# Patient Record
Sex: Female | Born: 1950 | Race: White | Hispanic: No | Smoking: Former smoker
Health system: Southern US, Community
[De-identification: ages and names within clinical notes are randomized; demographics above are authoritative.]

## PROBLEM LIST (undated history)

## (undated) DIAGNOSIS — G4733 Obstructive sleep apnea (adult) (pediatric): Secondary | ICD-10-CM

## (undated) DIAGNOSIS — M069 Rheumatoid arthritis, unspecified: Secondary | ICD-10-CM

## (undated) DIAGNOSIS — Z9889 Other specified postprocedural states: Secondary | ICD-10-CM

## (undated) DIAGNOSIS — M009 Pyogenic arthritis, unspecified: Secondary | ICD-10-CM

## (undated) DIAGNOSIS — F32A Depression, unspecified: Secondary | ICD-10-CM

## (undated) DIAGNOSIS — K219 Gastro-esophageal reflux disease without esophagitis: Secondary | ICD-10-CM

## (undated) DIAGNOSIS — J449 Chronic obstructive pulmonary disease, unspecified: Secondary | ICD-10-CM

## (undated) DIAGNOSIS — E785 Hyperlipidemia, unspecified: Secondary | ICD-10-CM

## (undated) DIAGNOSIS — R112 Nausea with vomiting, unspecified: Secondary | ICD-10-CM

## (undated) DIAGNOSIS — I10 Essential (primary) hypertension: Secondary | ICD-10-CM

## (undated) DIAGNOSIS — E109 Type 1 diabetes mellitus without complications: Secondary | ICD-10-CM

## (undated) DIAGNOSIS — F329 Major depressive disorder, single episode, unspecified: Secondary | ICD-10-CM

## (undated) DIAGNOSIS — M1711 Unilateral primary osteoarthritis, right knee: Secondary | ICD-10-CM

## (undated) HISTORY — DX: Depression, unspecified: F32.A

## (undated) HISTORY — DX: Pyogenic arthritis, unspecified: M00.9

## (undated) HISTORY — DX: Obstructive sleep apnea (adult) (pediatric): G47.33

## (undated) HISTORY — DX: Type 1 diabetes mellitus without complications: E10.9

## (undated) HISTORY — DX: Morbid (severe) obesity due to excess calories: E66.01

## (undated) HISTORY — DX: Gastro-esophageal reflux disease without esophagitis: K21.9

## (undated) HISTORY — DX: Essential (primary) hypertension: I10

## (undated) HISTORY — DX: Major depressive disorder, single episode, unspecified: F32.9

## (undated) HISTORY — DX: Unilateral primary osteoarthritis, right knee: M17.11

## (undated) HISTORY — DX: Rheumatoid arthritis, unspecified: M06.9

## (undated) HISTORY — DX: Chronic obstructive pulmonary disease, unspecified: J44.9

## (undated) HISTORY — DX: Hyperlipidemia, unspecified: E78.5

## (undated) HISTORY — PX: BREAST BIOPSY: SHX20

---
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
## 1990-04-11 HISTORY — PX: AORTO-PULMONARY WINDOW REPAIR: SHX1181

## 1990-04-11 HISTORY — PX: ABDOMINAL HYSTERECTOMY: SHX81

## 1990-04-11 HISTORY — PX: CHOLECYSTECTOMY: SHX55

## 1999-04-12 HISTORY — PX: HERNIA REPAIR: SHX51

## 2001-01-19 ENCOUNTER — Ambulatory Visit (HOSPITAL_COMMUNITY): Admission: RE | Admit: 2001-01-19 | Discharge: 2001-01-19 | Payer: Self-pay | Admitting: Internal Medicine

## 2001-07-19 ENCOUNTER — Ambulatory Visit (HOSPITAL_COMMUNITY): Admission: RE | Admit: 2001-07-19 | Discharge: 2001-07-19 | Payer: Self-pay | Admitting: Internal Medicine

## 2001-07-19 ENCOUNTER — Encounter: Payer: Self-pay | Admitting: Internal Medicine

## 2001-08-15 ENCOUNTER — Encounter: Admission: RE | Admit: 2001-08-15 | Discharge: 2001-11-13 | Payer: Self-pay | Admitting: Internal Medicine

## 2002-08-28 ENCOUNTER — Ambulatory Visit (HOSPITAL_COMMUNITY): Admission: RE | Admit: 2002-08-28 | Discharge: 2002-08-28 | Payer: Self-pay | Admitting: Internal Medicine

## 2002-08-28 ENCOUNTER — Encounter: Payer: Self-pay | Admitting: Internal Medicine

## 2002-12-27 ENCOUNTER — Ambulatory Visit (HOSPITAL_COMMUNITY): Admission: RE | Admit: 2002-12-27 | Discharge: 2002-12-27 | Payer: Self-pay | Admitting: Internal Medicine

## 2003-03-26 ENCOUNTER — Emergency Department (HOSPITAL_COMMUNITY): Admission: EM | Admit: 2003-03-26 | Discharge: 2003-03-26 | Payer: Self-pay | Admitting: Emergency Medicine

## 2003-08-11 ENCOUNTER — Ambulatory Visit (HOSPITAL_COMMUNITY): Admission: RE | Admit: 2003-08-11 | Discharge: 2003-08-11 | Payer: Self-pay | Admitting: Orthopedic Surgery

## 2004-01-07 ENCOUNTER — Ambulatory Visit (HOSPITAL_COMMUNITY): Admission: RE | Admit: 2004-01-07 | Discharge: 2004-01-07 | Payer: Self-pay | Admitting: Internal Medicine

## 2004-01-14 ENCOUNTER — Encounter: Admission: RE | Admit: 2004-01-14 | Discharge: 2004-01-14 | Payer: Self-pay | Admitting: Internal Medicine

## 2004-01-15 ENCOUNTER — Encounter: Admission: RE | Admit: 2004-01-15 | Discharge: 2004-01-15 | Payer: Self-pay | Admitting: Internal Medicine

## 2004-01-15 ENCOUNTER — Encounter (INDEPENDENT_AMBULATORY_CARE_PROVIDER_SITE_OTHER): Payer: Self-pay | Admitting: Specialist

## 2004-03-24 ENCOUNTER — Ambulatory Visit: Payer: Self-pay | Admitting: Physical Medicine & Rehabilitation

## 2004-03-24 ENCOUNTER — Inpatient Hospital Stay (HOSPITAL_COMMUNITY): Admission: RE | Admit: 2004-03-24 | Discharge: 2004-03-29 | Payer: Self-pay | Admitting: Orthopedic Surgery

## 2004-09-30 ENCOUNTER — Ambulatory Visit: Payer: Self-pay | Admitting: Internal Medicine

## 2004-11-17 ENCOUNTER — Ambulatory Visit: Payer: Self-pay | Admitting: Internal Medicine

## 2005-01-14 ENCOUNTER — Ambulatory Visit (HOSPITAL_COMMUNITY): Admission: RE | Admit: 2005-01-14 | Discharge: 2005-01-14 | Payer: Self-pay | Admitting: Internal Medicine

## 2005-04-11 HISTORY — PX: HERNIA REPAIR: SHX51

## 2005-04-27 ENCOUNTER — Ambulatory Visit (HOSPITAL_COMMUNITY): Admission: RE | Admit: 2005-04-27 | Discharge: 2005-04-27 | Payer: Self-pay | Admitting: Orthopedic Surgery

## 2005-08-11 ENCOUNTER — Encounter (INDEPENDENT_AMBULATORY_CARE_PROVIDER_SITE_OTHER): Payer: Self-pay | Admitting: Specialist

## 2005-08-11 ENCOUNTER — Ambulatory Visit (HOSPITAL_BASED_OUTPATIENT_CLINIC_OR_DEPARTMENT_OTHER): Admission: RE | Admit: 2005-08-11 | Discharge: 2005-08-11 | Payer: Self-pay | Admitting: Orthopedic Surgery

## 2005-12-16 ENCOUNTER — Inpatient Hospital Stay (HOSPITAL_COMMUNITY): Admission: EM | Admit: 2005-12-16 | Discharge: 2005-12-20 | Payer: Self-pay | Admitting: Emergency Medicine

## 2006-02-06 ENCOUNTER — Ambulatory Visit (HOSPITAL_COMMUNITY): Admission: RE | Admit: 2006-02-06 | Discharge: 2006-02-06 | Payer: Self-pay | Admitting: Internal Medicine

## 2006-06-04 ENCOUNTER — Emergency Department (HOSPITAL_COMMUNITY): Admission: EM | Admit: 2006-06-04 | Discharge: 2006-06-04 | Payer: Self-pay | Admitting: Emergency Medicine

## 2007-02-08 ENCOUNTER — Ambulatory Visit (HOSPITAL_COMMUNITY): Admission: RE | Admit: 2007-02-08 | Discharge: 2007-02-08 | Payer: Self-pay | Admitting: Internal Medicine

## 2007-08-20 DIAGNOSIS — E785 Hyperlipidemia, unspecified: Secondary | ICD-10-CM | POA: Insufficient documentation

## 2007-08-20 DIAGNOSIS — E119 Type 2 diabetes mellitus without complications: Secondary | ICD-10-CM

## 2007-08-20 DIAGNOSIS — E1110 Type 2 diabetes mellitus with ketoacidosis without coma: Secondary | ICD-10-CM | POA: Insufficient documentation

## 2007-08-20 DIAGNOSIS — J45909 Unspecified asthma, uncomplicated: Secondary | ICD-10-CM | POA: Insufficient documentation

## 2007-08-21 ENCOUNTER — Ambulatory Visit: Payer: Self-pay | Admitting: Pulmonary Disease

## 2007-08-21 DIAGNOSIS — J441 Chronic obstructive pulmonary disease with (acute) exacerbation: Secondary | ICD-10-CM | POA: Insufficient documentation

## 2007-08-21 DIAGNOSIS — J439 Emphysema, unspecified: Secondary | ICD-10-CM

## 2007-08-21 DIAGNOSIS — G4733 Obstructive sleep apnea (adult) (pediatric): Secondary | ICD-10-CM | POA: Insufficient documentation

## 2007-08-23 LAB — CONVERTED CEMR LAB: TSH: 0.33 microintl units/mL — ABNORMAL LOW (ref 0.35–5.50)

## 2007-09-02 ENCOUNTER — Ambulatory Visit (HOSPITAL_BASED_OUTPATIENT_CLINIC_OR_DEPARTMENT_OTHER): Admission: RE | Admit: 2007-09-02 | Discharge: 2007-09-02 | Payer: Self-pay | Admitting: Pulmonary Disease

## 2007-09-02 ENCOUNTER — Encounter: Payer: Self-pay | Admitting: Pulmonary Disease

## 2007-09-18 ENCOUNTER — Ambulatory Visit: Payer: Self-pay | Admitting: Pulmonary Disease

## 2007-09-20 ENCOUNTER — Ambulatory Visit: Payer: Self-pay | Admitting: Pulmonary Disease

## 2007-09-24 ENCOUNTER — Encounter: Payer: Self-pay | Admitting: Pulmonary Disease

## 2007-10-20 ENCOUNTER — Emergency Department (HOSPITAL_COMMUNITY): Admission: EM | Admit: 2007-10-20 | Discharge: 2007-10-20 | Payer: Self-pay | Admitting: Emergency Medicine

## 2007-10-25 ENCOUNTER — Ambulatory Visit (HOSPITAL_COMMUNITY): Admission: RE | Admit: 2007-10-25 | Discharge: 2007-10-25 | Payer: Self-pay | Admitting: General Surgery

## 2007-10-30 ENCOUNTER — Ambulatory Visit: Payer: Self-pay | Admitting: Pulmonary Disease

## 2007-11-29 ENCOUNTER — Encounter: Payer: Self-pay | Admitting: Pulmonary Disease

## 2008-02-15 ENCOUNTER — Ambulatory Visit: Payer: Self-pay | Admitting: Pulmonary Disease

## 2008-02-15 ENCOUNTER — Telehealth (INDEPENDENT_AMBULATORY_CARE_PROVIDER_SITE_OTHER): Payer: Self-pay | Admitting: *Deleted

## 2008-02-15 ENCOUNTER — Inpatient Hospital Stay (HOSPITAL_COMMUNITY): Admission: AD | Admit: 2008-02-15 | Discharge: 2008-02-20 | Payer: Self-pay | Admitting: Pulmonary Disease

## 2008-02-15 DIAGNOSIS — J45901 Unspecified asthma with (acute) exacerbation: Secondary | ICD-10-CM

## 2008-02-15 DIAGNOSIS — J209 Acute bronchitis, unspecified: Secondary | ICD-10-CM

## 2008-02-27 ENCOUNTER — Ambulatory Visit: Payer: Self-pay | Admitting: Pulmonary Disease

## 2008-02-27 DIAGNOSIS — R0602 Shortness of breath: Secondary | ICD-10-CM

## 2008-02-27 DIAGNOSIS — R05 Cough: Secondary | ICD-10-CM

## 2008-03-17 ENCOUNTER — Encounter: Payer: Self-pay | Admitting: Pulmonary Disease

## 2008-03-17 ENCOUNTER — Ambulatory Visit: Payer: Self-pay

## 2008-03-26 ENCOUNTER — Ambulatory Visit: Payer: Self-pay | Admitting: Pulmonary Disease

## 2008-04-30 ENCOUNTER — Encounter: Admission: RE | Admit: 2008-04-30 | Discharge: 2008-04-30 | Payer: Self-pay | Admitting: Internal Medicine

## 2008-05-09 ENCOUNTER — Encounter: Admission: RE | Admit: 2008-05-09 | Discharge: 2008-05-09 | Payer: Self-pay | Admitting: Internal Medicine

## 2008-07-24 ENCOUNTER — Ambulatory Visit: Payer: Self-pay | Admitting: Pulmonary Disease

## 2009-01-16 ENCOUNTER — Encounter (HOSPITAL_BASED_OUTPATIENT_CLINIC_OR_DEPARTMENT_OTHER): Admission: RE | Admit: 2009-01-16 | Discharge: 2009-04-06 | Payer: Self-pay | Admitting: General Surgery

## 2009-01-19 ENCOUNTER — Encounter (INDEPENDENT_AMBULATORY_CARE_PROVIDER_SITE_OTHER): Payer: Self-pay | Admitting: *Deleted

## 2009-01-19 LAB — CONVERTED CEMR LAB
Albumin: 3.8 g/dL
Alkaline Phosphatase: 81 units/L
CO2: 24 meq/L
Chloride: 103 meq/L
Creatinine, Ser: 0.93 mg/dL
Glucose, Bld: 98 mg/dL
Total Protein: 7.3 g/dL

## 2009-01-21 ENCOUNTER — Ambulatory Visit (HOSPITAL_COMMUNITY): Admission: RE | Admit: 2009-01-21 | Discharge: 2009-01-21 | Payer: Self-pay | Admitting: General Surgery

## 2009-01-26 ENCOUNTER — Ambulatory Visit: Payer: Self-pay | Admitting: Pulmonary Disease

## 2009-04-17 ENCOUNTER — Encounter (HOSPITAL_BASED_OUTPATIENT_CLINIC_OR_DEPARTMENT_OTHER): Admission: RE | Admit: 2009-04-17 | Discharge: 2009-07-16 | Payer: Self-pay | Admitting: General Surgery

## 2009-05-12 ENCOUNTER — Telehealth (INDEPENDENT_AMBULATORY_CARE_PROVIDER_SITE_OTHER): Payer: Self-pay | Admitting: *Deleted

## 2009-05-13 ENCOUNTER — Ambulatory Visit: Payer: Self-pay | Admitting: Pulmonary Disease

## 2009-05-15 ENCOUNTER — Encounter (INDEPENDENT_AMBULATORY_CARE_PROVIDER_SITE_OTHER): Payer: Self-pay | Admitting: *Deleted

## 2009-05-29 ENCOUNTER — Ambulatory Visit: Payer: Self-pay | Admitting: Cardiovascular Disease

## 2009-05-29 DIAGNOSIS — R011 Cardiac murmur, unspecified: Secondary | ICD-10-CM | POA: Insufficient documentation

## 2009-06-04 ENCOUNTER — Ambulatory Visit: Payer: Self-pay | Admitting: Cardiology

## 2009-06-04 ENCOUNTER — Encounter (HOSPITAL_COMMUNITY): Admission: RE | Admit: 2009-06-04 | Discharge: 2009-07-04 | Payer: Self-pay | Admitting: Cardiovascular Disease

## 2009-06-09 ENCOUNTER — Encounter: Payer: Self-pay | Admitting: Cardiovascular Disease

## 2009-06-11 ENCOUNTER — Encounter: Payer: Self-pay | Admitting: Cardiovascular Disease

## 2009-06-11 ENCOUNTER — Encounter (INDEPENDENT_AMBULATORY_CARE_PROVIDER_SITE_OTHER): Payer: Self-pay | Admitting: *Deleted

## 2009-06-11 ENCOUNTER — Telehealth (INDEPENDENT_AMBULATORY_CARE_PROVIDER_SITE_OTHER): Payer: Self-pay | Admitting: *Deleted

## 2009-06-15 ENCOUNTER — Encounter (INDEPENDENT_AMBULATORY_CARE_PROVIDER_SITE_OTHER): Payer: Self-pay | Admitting: *Deleted

## 2009-06-15 ENCOUNTER — Encounter: Payer: Self-pay | Admitting: Cardiovascular Disease

## 2009-07-06 ENCOUNTER — Encounter: Admission: RE | Admit: 2009-07-06 | Discharge: 2009-07-06 | Payer: Self-pay | Admitting: Internal Medicine

## 2009-07-28 ENCOUNTER — Encounter (HOSPITAL_BASED_OUTPATIENT_CLINIC_OR_DEPARTMENT_OTHER): Admission: RE | Admit: 2009-07-28 | Discharge: 2009-10-26 | Payer: Self-pay | Admitting: General Surgery

## 2009-08-03 ENCOUNTER — Ambulatory Visit: Payer: Self-pay | Admitting: Pulmonary Disease

## 2009-08-06 ENCOUNTER — Telehealth (INDEPENDENT_AMBULATORY_CARE_PROVIDER_SITE_OTHER): Payer: Self-pay | Admitting: *Deleted

## 2009-08-13 ENCOUNTER — Observation Stay (HOSPITAL_COMMUNITY): Admission: AD | Admit: 2009-08-13 | Discharge: 2009-08-15 | Payer: Self-pay | Admitting: Surgery

## 2009-08-13 ENCOUNTER — Ambulatory Visit: Payer: Self-pay | Admitting: Pulmonary Disease

## 2009-08-13 HISTORY — PX: ABDOMINAL WALL MESH  REMOVAL: SHX1116

## 2009-09-04 ENCOUNTER — Encounter: Payer: Self-pay | Admitting: Pulmonary Disease

## 2009-09-07 ENCOUNTER — Emergency Department (HOSPITAL_COMMUNITY): Admission: EM | Admit: 2009-09-07 | Discharge: 2009-09-07 | Payer: Self-pay | Admitting: Emergency Medicine

## 2009-09-08 ENCOUNTER — Ambulatory Visit (HOSPITAL_COMMUNITY): Admission: RE | Admit: 2009-09-08 | Discharge: 2009-09-08 | Payer: Self-pay | Admitting: Emergency Medicine

## 2009-09-30 ENCOUNTER — Telehealth (INDEPENDENT_AMBULATORY_CARE_PROVIDER_SITE_OTHER): Payer: Self-pay | Admitting: *Deleted

## 2009-10-30 ENCOUNTER — Encounter (HOSPITAL_BASED_OUTPATIENT_CLINIC_OR_DEPARTMENT_OTHER)
Admission: RE | Admit: 2009-10-30 | Discharge: 2010-01-07 | Payer: Self-pay | Source: Home / Self Care | Admitting: General Surgery

## 2009-11-17 ENCOUNTER — Encounter: Payer: Self-pay | Admitting: Pulmonary Disease

## 2009-11-23 ENCOUNTER — Telehealth (INDEPENDENT_AMBULATORY_CARE_PROVIDER_SITE_OTHER): Payer: Self-pay | Admitting: *Deleted

## 2010-01-04 ENCOUNTER — Ambulatory Visit (HOSPITAL_COMMUNITY): Admission: RE | Admit: 2010-01-04 | Discharge: 2010-01-05 | Payer: Self-pay | Admitting: Surgery

## 2010-01-04 ENCOUNTER — Encounter (INDEPENDENT_AMBULATORY_CARE_PROVIDER_SITE_OTHER): Payer: Self-pay | Admitting: Surgery

## 2010-01-04 HISTORY — PX: FOREIGN BODY REMOVAL ABDOMINAL: SHX5319

## 2010-04-11 DIAGNOSIS — M1711 Unilateral primary osteoarthritis, right knee: Secondary | ICD-10-CM

## 2010-04-11 DIAGNOSIS — M009 Pyogenic arthritis, unspecified: Secondary | ICD-10-CM

## 2010-04-11 HISTORY — DX: Pyogenic arthritis, unspecified: M00.9

## 2010-04-11 HISTORY — DX: Unilateral primary osteoarthritis, right knee: M17.11

## 2010-04-21 ENCOUNTER — Ambulatory Visit
Admission: RE | Admit: 2010-04-21 | Discharge: 2010-04-21 | Payer: Self-pay | Source: Home / Self Care | Attending: Pulmonary Disease | Admitting: Pulmonary Disease

## 2010-05-02 ENCOUNTER — Encounter: Payer: Self-pay | Admitting: Internal Medicine

## 2010-05-03 ENCOUNTER — Telehealth (INDEPENDENT_AMBULATORY_CARE_PROVIDER_SITE_OTHER): Payer: Self-pay | Admitting: *Deleted

## 2010-05-04 LAB — COMPREHENSIVE METABOLIC PANEL
AST: 20 U/L (ref 0–37)
Albumin: 3.2 g/dL — ABNORMAL LOW (ref 3.5–5.2)
BUN: 9 mg/dL (ref 6–23)
Calcium: 9.6 mg/dL (ref 8.4–10.5)
Creatinine, Ser: 0.63 mg/dL (ref 0.4–1.2)
GFR calc Af Amer: >60 mL/min (ref >60)
Total Protein: 6.7 g/dL (ref 6.0–8.3)

## 2010-05-04 LAB — ABO/RH: ABO/RH(D): A POS

## 2010-05-04 LAB — URINE MICROSCOPIC-ADD ON

## 2010-05-04 LAB — TYPE AND SCREEN: Antibody Screen: NEGATIVE

## 2010-05-04 LAB — CBC
MCH: 24.4 pg — ABNORMAL LOW (ref 26.0–34.0)
Platelets: 263 10*3/uL (ref 150–400)
RBC: 5.13 MIL/uL — ABNORMAL HIGH (ref 3.87–5.11)
RDW: 15.9 % — ABNORMAL HIGH (ref 11.5–15.5)
WBC: 12.3 10*3/uL — ABNORMAL HIGH (ref 4.0–10.5)

## 2010-05-04 LAB — DIFFERENTIAL
Basophils Relative: 0 % (ref 0–1)
Eosinophils Absolute: 0.1 10*3/uL (ref 0.0–0.7)
Eosinophils Relative: 1 % (ref 0–5)
Neutrophils Relative %: 76 % (ref 43–77)

## 2010-05-04 LAB — URINALYSIS, ROUTINE W REFLEX MICROSCOPIC
Bilirubin Urine: NEGATIVE
Hgb urine dipstick: NEGATIVE
Ketones, ur: NEGATIVE mg/dL
Protein, ur: NEGATIVE mg/dL
Urobilinogen, UA: 1 mg/dL (ref 0.0–1.0)

## 2010-05-04 LAB — PROTIME-INR
INR: 0.99 (ref 0.00–1.49)
Prothrombin Time: 13.3 seconds (ref 11.6–15.2)

## 2010-05-04 LAB — APTT: aPTT: 27 seconds (ref 24–37)

## 2010-05-04 LAB — SURGICAL PCR SCREEN: Staphylococcus aureus: NEGATIVE

## 2010-05-07 ENCOUNTER — Inpatient Hospital Stay (HOSPITAL_COMMUNITY)
Admission: RE | Admit: 2010-05-07 | Discharge: 2010-05-11 | Disposition: A | Discharge status: Home / Self Care | Payer: Self-pay | Source: Home / Self Care | Attending: Orthopedic Surgery | Admitting: Orthopedic Surgery

## 2010-05-07 HISTORY — PX: TOTAL KNEE ARTHROPLASTY: SHX125

## 2010-05-07 LAB — GLUCOSE, CAPILLARY
Glucose-Capillary: 214 mg/dL — ABNORMAL HIGH (ref 70–99)
Glucose-Capillary: 184 mg/dL — ABNORMAL HIGH (ref 70–99)

## 2010-05-08 LAB — CBC
Hemoglobin: 10.7 g/dL — ABNORMAL LOW (ref 12.0–15.0)
MCH: 24.5 pg — ABNORMAL LOW (ref 26.0–34.0)
MCV: 80.5 fL (ref 78.0–100.0)
Platelets: 276 10*3/uL (ref 150–400)
RBC: 4.37 MIL/uL (ref 3.87–5.11)
WBC: 12.9 10*3/uL — ABNORMAL HIGH (ref 4.0–10.5)

## 2010-05-08 LAB — BASIC METABOLIC PANEL
CO2: 29 mEq/L (ref 19–32)
Chloride: 100 mEq/L (ref 96–112)
GFR calc Af Amer: >60 mL/min (ref >60)
Potassium: 4.6 mEq/L (ref 3.5–5.1)

## 2010-05-08 LAB — PROTIME-INR
INR: 1.19 (ref 0.00–1.49)
Prothrombin Time: 15.3 seconds — ABNORMAL HIGH (ref 11.6–15.2)

## 2010-05-08 LAB — GLUCOSE, CAPILLARY
Glucose-Capillary: 177 mg/dL — ABNORMAL HIGH (ref 70–99)
Glucose-Capillary: 149 mg/dL — ABNORMAL HIGH (ref 70–99)

## 2010-05-08 LAB — HEMOGLOBIN A1C
Hgb A1c MFr Bld: 8.9 % — ABNORMAL HIGH (ref <5.7)
Mean Plasma Glucose: 209 mg/dL — ABNORMAL HIGH (ref <117)

## 2010-05-09 LAB — BASIC METABOLIC PANEL
BUN: 10 mg/dL (ref 6–23)
Chloride: 93 mEq/L — ABNORMAL LOW (ref 96–112)
GFR calc Af Amer: >60 mL/min (ref >60)
GFR calc non Af Amer: 55 mL/min — ABNORMAL LOW (ref >60)
Potassium: 3.8 mEq/L (ref 3.5–5.1)
Sodium: 128 mEq/L — ABNORMAL LOW (ref 135–145)

## 2010-05-09 LAB — GLUCOSE, CAPILLARY
Glucose-Capillary: 214 mg/dL — ABNORMAL HIGH (ref 70–99)
Glucose-Capillary: 277 mg/dL — ABNORMAL HIGH (ref 70–99)

## 2010-05-09 LAB — PROTIME-INR
INR: 1.37 (ref 0.00–1.49)
Prothrombin Time: 17.1 seconds — ABNORMAL HIGH (ref 11.6–15.2)

## 2010-05-09 LAB — CBC
MCV: 79.6 fL (ref 78.0–100.0)
Platelets: 204 10*3/uL (ref 150–400)
RBC: 3.53 MIL/uL — ABNORMAL LOW (ref 3.87–5.11)
RDW: 16.9 % — ABNORMAL HIGH (ref 11.5–15.5)
WBC: 12.7 10*3/uL — ABNORMAL HIGH (ref 4.0–10.5)

## 2010-05-10 LAB — CBC
HCT: 26.6 % — ABNORMAL LOW (ref 36.0–46.0)
RDW: 16.3 % — ABNORMAL HIGH (ref 11.5–15.5)
WBC: 8.6 10*3/uL (ref 4.0–10.5)

## 2010-05-10 LAB — BASIC METABOLIC PANEL
CO2: 23 mEq/L (ref 19–32)
Calcium: 7.9 mg/dL — ABNORMAL LOW (ref 8.4–10.5)
Creatinine, Ser: 0.76 mg/dL (ref 0.4–1.2)
GFR calc Af Amer: >60 mL/min (ref >60)
GFR calc non Af Amer: >60 mL/min (ref >60)
Sodium: 130 mEq/L — ABNORMAL LOW (ref 135–145)

## 2010-05-10 LAB — GLUCOSE, CAPILLARY
Glucose-Capillary: 151 mg/dL — ABNORMAL HIGH (ref 70–99)
Glucose-Capillary: 180 mg/dL — ABNORMAL HIGH (ref 70–99)
Glucose-Capillary: 143 mg/dL — ABNORMAL HIGH (ref 70–99)

## 2010-05-10 LAB — CK: Total CK: 275 U/L — ABNORMAL HIGH (ref 7–177)

## 2010-05-10 LAB — PROTIME-INR
INR: 1.56 — ABNORMAL HIGH (ref 0.00–1.49)
Prothrombin Time: 18.9 seconds — ABNORMAL HIGH (ref 11.6–15.2)

## 2010-05-11 LAB — PROTIME-INR
INR: 1.65 — ABNORMAL HIGH (ref 0.00–1.49)
Prothrombin Time: 19.7 seconds — ABNORMAL HIGH (ref 11.6–15.2)

## 2010-05-11 LAB — GLUCOSE, CAPILLARY: Glucose-Capillary: 160 mg/dL — ABNORMAL HIGH (ref 70–99)

## 2010-05-11 NOTE — Assessment & Plan Note (Signed)
Summary: rov for preop pulm clearance, osa, emphysema   Copy to:  Gross Primary Provider/Referring Provider:  Dr. Carylon Perches  CC:  Pt is here for a f/u appt for surgical clearance.  Dr. Michaell Cowing has scheduled pt for ventral hernia surgery with an infected mesh.  Regarding her sleep apnea, pt states she is using her cpap machine every night. Approx 7 hours per night.  Pt believes she is due for a new mask.  Pt denied any complaints with pressure.  Regarding her breathing, and pt states breathing is unchanged from last visit.  Occ cough with clear sputum.  Pt states she is using Spiriva prn. .  History of Present Illness: the pt comes in today for preop pulmonary clearance for upcoming ventral hernia surgery.  I have spoken to Dr. Michaell Cowing, and is expecting a complicated surgery.  The pt has known obesity, osa, and moderate emphysema with asthmatic component.  She is being compliant with cpap, and feels that she is sleeping relatively well.  She is staying on advair compliantly, but is only using spiriva as needed.  She feels that her exetional tolerance is at usual baseline, and denies any symptoms of ongoing pulmonary infection.  Current Medications (verified): 1)  Novolog 100 Unit/ml Soln (Insulin Aspart) .... As Directed 2)  Glucophage 500 Mg  Tabs (Metformin Hcl) .... Four Tables Daily 3)  Zocor 10 Mg  Tabs (Simvastatin) .... Take One Tablet At Bedtime 4)  Folic Acid 1 Mg  Tabs (Folic Acid) .... One Tablet Daily. 5)  Methotrexate 2.5 Mg  Tabs (Methotrexate Sodium) .... 8 Tabs By Mouth Once A Week 6)  Enbrel 50 Mg/ml  Soln (Etanercept) .... One Injection Weekly 7)  Calcium 600/vitamin D 600-400 Mg-Unit  Tabs (Calcium Carbonate-Vitamin D) .... One Tablet Two Times A Day 8)  Micardis 40 Mg  Tabs (Telmisartan) .... One Tablet Daily. 9)  Spiriva Handihaler 18 Mcg  Caps (Tiotropium Bromide Monohydrate) .... One Inhalation Each Morning As Needed 10)  Lasix 40 Mg  Tabs (Furosemide) .... Use  As Needed 11)   Proventil Hfa 108 (90 Base) Mcg/act  Aers (Albuterol Sulfate) .Marland Kitchen.. 1-2 Puffs 4 Times Daily As Needed 12)  Ferrousul 325 (65 Fe) Mg  Tabs (Ferrous Sulfate) .Marland Kitchen.. 1 By Mouth Daily 13)  Fish Oil 1000 Mg  Caps (Omega-3 Fatty Acids) .... 2 By Mouth Daily 14)  Arthrotec 50 50-200 Mg-Mcg  Tabs (Diclofenac-Misoprostol) .Marland Kitchen.. 1 By Mouth Two Times A Day 15)  Fexofenadine Hcl 180 Mg  Tabs (Fexofenadine Hcl) .Marland Kitchen.. 1 By Mouth Daily 16)  Sertraline Hcl 100 Mg  Tabs (Sertraline Hcl) .Marland Kitchen.. 1 By Mouth Daily 17)  Byetta 10 Mcg Pen 10 Mcg/0.32ml  Soln (Exenatide) .... Two Times A Day 18)  Advair Hfa 115-21 Mcg/act Aero (Fluticasone-Salmeterol) .... Inhale 2 Puffs Two Times A Day 19)  Omeprazole 40 Mg Cpdr (Omeprazole) .... One Each Am 20)  Lantus 100 Unit/ml Soln (Insulin Glargine) .... Use As Directed  Allergies (verified): 1)  ! Tetracycline 2)  ! Sulfa   Review of Systems      See HPI  Vital Signs:  Patient profile:   60 year old female Height:      62 inches Weight:      292.50 pounds BMI:     53.69 O2 Sat:      96 % on Room air Temp:     98.0 degrees F oral Pulse rate:   86 / minute BP sitting:   110 / 60  (  left arm) Cuff size:   large  Vitals Entered By: Arman Filter LPN (May 13, 2009 11:06 AM)  O2 Flow:  Room air CC: Pt is here for a f/u appt for surgical clearance.  Dr. Michaell Cowing has scheduled pt for ventral hernia surgery with an infected mesh.  Regarding her sleep apnea, pt states she is using her cpap machine every night. Approx 7 hours per night.  Pt believes she is due for a new mask.  Pt denied any complaints with pressure.  Regarding her breathing, pt states breathing is unchanged from last visit.  Occ cough with clear sputum.  Pt states she is using Spiriva prn.  Comments Medications reviewed with patient Arman Filter LPN  May 13, 2009 11:08 AM    Physical Exam  General:  obese female in nad Lungs:  mildly decreased bs, no wheezing or rhonchi Heart:  rrr, no  mrg Extremities:  + edema , no cyanosis Neurologic:  alert and oriented, moves all 4.   Impression & Recommendations:  Problem # 1:  EMPHYSEMA (ICD-492.8) the pt is at a stable baseline currently wrt to her breathing.  There is really nothing from a pulmonary standpoint to "tune up pre-operatively", but I have asked her to get back on spiriva daily in addition to her advair.  I have had a long discussion with her about her pulmonary risks for the surgery.  I have explained that major abdominal surgery has a big impact on the mechanics of breathing, and that she is at fairly high risk for postop pulmonary complications given her obesity and underlying lung disease.  These can include postop ventilator dependence, pna, PE, etc.  She will need nebulized bronchodilators, early OOB, DVT prophylaxis ASAP post op.  I have spent with the pt today discussing the above.  Problem # 2:  OBSTRUCTIVE SLEEP APNEA (ICD-327.23) The pt is doing well from this standpoint with cpap.  She will need cpap at HS postop while in hospital.  Medications Added to Medication List This Visit: 1)  Methotrexate 2.5 Mg Tabs (Methotrexate sodium) .... 8 tabs by mouth once a week 2)  Spiriva Handihaler 18 Mcg Caps (Tiotropium bromide monohydrate) .... One inhalation each morning as needed  Other Orders: Est. Patient Level III (33295)  Patient Instructions: 1)  get back on spiriva one inhalation once a day until surgery. 2)  stay on advair 3)  take your cpap mask and headgear to the hospital with you.  Respiratory therapy will provide you with an autotitrating machine to use at bedtime while you are in the hospital. 4)  I will send a note to your surgeon.   Immunization History:  Influenza Immunization History:    Influenza:  historical (01/31/2009)  Pneumovax Immunization History:    Pneumovax:  historical (02/17/2008)

## 2010-05-11 NOTE — Letter (Signed)
Summary: [facility]  [facility]   Imported By: Lester O[place]ogan 09/10/2009 09:13:01  _____________________________________________________________________  External Attachment:    Type:   Image     Comment:   External Document

## 2010-05-11 NOTE — Progress Notes (Signed)
Summary: surgical clearance  Phone Note Call from Patient Call back at [phone]   Caller: Patient Call For: clance Summary of Call: pt need surgical clearance  Initial call taken by: Rickard Patience,  May 12, 2009 4:20 PM  Follow-up for Phone Call        called and spoke with pt.  last saw [facility] 01-26-2009 and has 6 month f/u appt scheduled fro 07-27-2009.  pt states she had hernia surgery awhile back but states her body had a reaction to the mesh and is now an infected open wound.  Pt states Dr. Karie Soda is going to do the surgery to remove the mesh.  Informed pt KC out of the office today and will be back tomorrow to address the message.  pt was ok with this.  KC, please advise if pt needs appt to be evaluated for  surgical clearance or not.  Thank you.  Aundra Millet Reynolds LPN  May 12, 2009 4:26 PM   Additional Follow-up for Phone Call Additional follow up Details #1::        spoke with Dr Michaell Cowing....she needs ov to go over this since she is at HIGH risk for surgery. Additional Follow-up by: Barbaraann Share MD,  May 13, 2009 6:40 AM    Additional Follow-up for Phone Call Additional follow up Details #2::    The patient is aware [facility] would like to see her before giving surgical clearance and is sch for 05/13/09 at 11am.Lori [facility] CMA  May 13, 2009 8:41 AM

## 2010-05-11 NOTE — Miscellaneous (Signed)
Summary: LABS CMP,01/19/2009  Clinical Lists Changes  Observations: Added new observation of CALCIUM: 9.4 mg/dL (09/81/1914 78:29) Added new observation of ALBUMIN: 3.8 g/dL (56/21/3086 57:84) Added new observation of PROTEIN, TOT: 7.3 g/dL (69/62/9528 41:32) Added new observation of SGPT (ALT): 20 units/L (01/19/2009 15:20) Added new observation of SGOT (AST): 15 units/L (01/19/2009 15:20) Added new observation of ALK PHOS: 81 units/L (01/19/2009 15:20) Added new observation of CREATININE: 0.93 mg/dL (44/04/270 53:66) Added new observation of BUN: 15 mg/dL (44/06/4740 59:56) Added new observation of BG RANDOM: 98 mg/dL (38/75/6433 29:51) Added new observation of CO2 PLSM/SER: 24 meq/L (01/19/2009 15:20) Added new observation of CL SERUM: 103 meq/L (01/19/2009 15:20) Added new observation of K SERUM: 4.5 meq/L (01/19/2009 15:20) Added new observation of NA: 140 meq/L (01/19/2009 15:20)

## 2010-05-11 NOTE — Letter (Signed)
Summary: Clearance Letter  [facility] at [facility]  [address] [place] [address]   Phone: [phone]  Fax: [phone]    June 15, 2009  Re:     [name] Address:   [address]     [place], [place]  [address] DOB:     1950-04-25 MRN:     [id number]   Dear Dr. Michaell Cowing,       [name] nuclear stress test was normal, thus giving her cardiac   clearance to procedure with her planned surgery.  We will be glad to   consult if needed during her hospitalization.           Sincerely,  Dr. Charlton Haws, MD, [facility]

## 2010-05-11 NOTE — Letter (Signed)
Summary: PROGRESS NOTE  PROGRESS NOTE   Imported By: Faythe Ghee 06/09/2009 15:08:47  _____________________________________________________________________  External Attachment:    Type:   Image     Comment:   External Document

## 2010-05-11 NOTE — Letter (Signed)
Summary: [place] [facility] (Nuc Med Stress)  [facility] at [facility]  [address], [place] [address]   Phone: [phone]  Fax: [phone]    Nuclear Medicine 1-Day Stress Test Information Sheet  Re:     [name]   DOB:     1950/11/21 MRN:     [id number] Weight:  Appointment Date: Register at: Appointment Time: Referring MD:  ___Exercise Stress  __Adenosine   __Dobutamine  _X_Lexiscan  __Persantine   __Thallium  Urgency: ____1 (next day)   ____2 (one week)    ____3 (PRN)  Patient will receive Follow Up call with results: Patient needs follow-up appointment:  Instructions regarding medication:  How to prepare for your stress test: 1. DO NOT eat or dring 6 hours prior to your arrival time. This includes no caffeine (coffee, tea, sodas, chocolate) if you were instructed to take your medications, drink water with it. 2. DO NOT use any tobacco products for at leaset 8 hours prior to arrival. 3. DO NOT wear dresses or any clothing that may have metal clasps or buttons. 4. Wear short sleeve shirts, loose clothing, and comfortalbe walking shoes. 5. DO NOT use lotions, oils or powder on your chest before the test. 6. The test will take approximately 3-4 hours from the time you arrive until completion. 7. To register the day of the test, go to the Short Stay entrance at [facility]. 8. If you must cancel your test, call [phone] as soon as you are aware. PLEASE DO NOT TAKE NOVOLOG, GLUCOPHAGE, LASIX OR LANTUS THE MORNING OF LEXISCAN. After you arrive for test:   When you arrive at [facility], you will go to Short Stay to be registered. They will then send you to Radiology to check in. The Nuclear Medicine Tech will get you and start an IV in your arm or hand. A small amount of a radioactive tracer will then be injected into your IV. This tracer will then have to circulate for 30-45 minutes. During this time you will wait in the waiting room and you will be  able to drink something without caffeine. A series of pictures will be taken of your heart follwoing this waiting period. After the 1st set of pictures you will go to the stress lab to get ready for your stress test. During the stress test, another small amount of a radioactive tracer will be injected through your IV. When the stress test is complete, there is a short rest period while your heart rate and blood pressure will be monitored. When this monitoring period is complete you will have another set of pictrues taken. (The same as the 1st set of pictures). These pictures are taken between 15 minutes and 1 hour after the stress test. The time depends on the type of stress test you had. Your doctor will inform you of your test results within 7 days after test.    The possibilities of certain changes are possible during the test. They include abnormal blood pressure and disorders of the heart. Side effects of persantine or adenosine can include flushing, chest pain, shortness of breath, stomach tightness, headache and light-headedness. These side effects usually do not last long and are self-resolving. Every effort will be made to keep you comfortable and to minimize complications by obtaining a medical history and by close observation during the test. Emergency equipment, medications, and trained personnel are available to deal with any unusual situation which may arise.  Please notify office  at least 48 hours in advance if you are unable to keep this appt.

## 2010-05-11 NOTE — Medication Information (Signed)
Summary: [facility]  [facility]   Imported By: Sherian Rein 11/20/2009 15:18:22  _____________________________________________________________________  External Attachment:    Type:   Image     Comment:   External Document

## 2010-05-11 NOTE — Letter (Signed)
Summary: [facility] at [facility]  [address], [place] [address]   Phone: [phone]  Fax: [phone]      June 11, 2009 MRN: [id number]   [name] [address] [place], [place]  [address]   Dear Ms. [name],  Your test ordered by Selena Batten has been reviewed by your physician (or physician assistant) and was found to be normal or stable. Your physician (or physician assistant) felt no changes were needed at this time.  ____ Echocardiogram  __x__ Cardiac Stress Test  ____ Lab Work  ____ Peripheral vascular study of arms, legs or neck  ____ CT scan or X-ray  ____ Lung or Breathing test  ____ Other:  No change in medical treatment at this time, per Dr. Dietrich Pates.  Thank you, [name] Allyne Gee RN    Union Bing, MD, [facility]ise Arena.C.Gaylord Shih, MD, F.A.C.C Lewayne Bunting, MD, F.A.C.C Nona Dell, MD, F.A.C.C Charlton Haws, MD, Lenise Arena.C.C

## 2010-05-11 NOTE — Progress Notes (Signed)
Summary: results  Phone Note Call from Patient   Caller: Patient Reason for Call: Lab or Test Results Summary of Call: pt wants results of myoview done last week/tg Initial call taken by: Raechel Ache [facility],  June 11, 2009 2:10 PM  Follow-up for Phone Call        Dr. Eden Emms since her stress test was negative, is she cleared for her surgery Follow-up by: Teressa Lower RN,  June 12, 2009 11:30 AM

## 2010-05-11 NOTE — Assessment & Plan Note (Signed)
Summary: ***surg ventral hernia repair/infected mesh now   Visit Type:  Initial Consult Referring Provider:  Gross Primary Provider:  Dr. Carylon Perches  CC:  no complaints today.  History of Present Illness: [name] is seen today at the request of Dr Acquanetta Belling for preoperative clearance.  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  She has an infected wound in her abdomen and needs a ventral hernia repair.  She has had a previous pericardial window by Dr Edwyna Shell in the 90's.  She has no documented CAD.  She is sedentary.  She sees Dr Shelle Iron for asthma and sleep apnea.  She cannot tolerate a CPAP mask well.  Her dyspnea would appear to be lung related.  She is to see Dr Shelle Iron to preoperative clearance as well.  She denies SSCP, palpitations, or syncope.  She has not had a ETT in over 20 years.    Current Problems (verified): 1)  Cardiac Murmur  (ICD-785.2) 2)  Unspecified Pre-operative Examination  (ICD-V72.84) 3)  Dyspnea  (ICD-786.05) 4)  Cough  (ICD-786.2) 5)  Asthma Unspecified With Exacerbation  (ICD-493.92) 6)  Acute Bronchitis  (ICD-466.0) 7)  Obstructive Sleep Apnea  (ICD-327.23) 8)  Emphysema  (ICD-492.8) 9)  Dm  (ICD-250.00) 10)  Hyperlipidemia  (ICD-272.4) 11)  Morbid Obesity  (ICD-278.01) 12)  Asthma  (ICD-493.90)  Current Medications (verified): 1)  Novolog 100 Unit/ml Soln (Insulin Aspart) .... As Directed 2)  Glucophage 500 Mg  Tabs (Metformin Hcl) .... Four Tables Daily 3)  Zocor 10 Mg  Tabs (Simvastatin) .... Take One Tablet At Bedtime 4)  Folic Acid 1 Mg  Tabs (Folic Acid) .... One Tablet Daily. 5)  Methotrexate 2.5 Mg  Tabs (Methotrexate Sodium) .... 8 Tabs By Mouth Once A Week 6)  Enbrel 50 Mg/ml  Soln (Etanercept) .... One Injection Weekly 7)  Calcium 600/vitamin D 600-400 Mg-Unit  Tabs (Calcium Carbonate-Vitamin D) .... One Tablet Two Times A Day 8)  Micardis 40 Mg  Tabs (Telmisartan) .... One Tablet Daily. 9)  Spiriva Handihaler 18 Mcg  Caps  (Tiotropium Bromide Monohydrate) .... One Inhalation Each Morning As Needed 10)  Lasix 40 Mg  Tabs (Furosemide) .... Use  As Needed 11)  Proventil Hfa 108 (90 Base) Mcg/act  Aers (Albuterol Sulfate) .Marland Kitchen.. 1-2 Puffs 4 Times Daily As Needed 12)  Ferrousul 325 (65 Fe) Mg  Tabs (Ferrous Sulfate) .Marland Kitchen.. 1 By Mouth Daily 13)  Fish Oil 1000 Mg  Caps (Omega-3 Fatty Acids) .... 2 By Mouth Daily 14)  Arthrotec 75 75-200 Mg-Mcg Tabs (Diclofenac-Misoprostol) .... Take 1 Tab Two Times A Day 15)  Fexofenadine Hcl 180 Mg  Tabs (Fexofenadine Hcl) .Marland Kitchen.. 1 By Mouth Daily 16)  Sertraline Hcl 100 Mg  Tabs (Sertraline Hcl) .Marland Kitchen.. 1 By Mouth Daily 17)  Byetta 10 Mcg Pen 10 Mcg/0.67ml  Soln (Exenatide) .... Two Times A Day 18)  Advair Hfa 115-21 Mcg/act Aero (Fluticasone-Salmeterol) .... Inhale 2 Puffs Two Times A Day 19)  Omeprazole 40 Mg Cpdr (Omeprazole) .... One Each Am 20)  Lantus 100 Unit/ml Soln (Insulin Glargine) .... 90 Units Two Times A Day 21)  Simvastatin 40 Mg Tabs (Simvastatin) .... Take 1 Tab Daily  Allergies (verified): 1)  ! Tetracycline 2)  ! 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 Lives with her daughter.  Review of Systems       Denies fever, malais, weight loss, blurry vision, decreased visual acuity, cough, sputum, hemoptysis, pleuritic pain, palpitaitons, heartburn, abdominal pain, melena,  lower extremity edema, claudication, or rash. All other systems reviewed and negative  Vital Signs:  Patient profile:   60 year old female Height:      62 inches Weight:      286 pounds Pulse rate:   77 / minute BP sitting:   110 / 59  (right arm)  Vitals Entered By: Dreama Saa, CNA (May 29, 2009 11:27 AM)  Physical Exam  General:  Affect appropriate Healthy:  appears stated age HEENT: normal Neck supple with no adenopathy JVP normal no bruits no thyromegaly Lungs clear with no wheezing and good diaphragmatic motion Heart:  S1/S2 soft systolic ejection  murmur no ,rub, gallop or click PMI normal Abdomen: benighn, BS positve, no tenderness, no AAA no bruit.  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 One tablet daily.    Lasix 40 Mg Tabs (Furosemide) ..... Use  as needed  Orders: 2-D Echocardiogram (2D Echo)  Problem # 2:  UNSPECIFIED PRE-OPERATIVE EXAMINATION (ICD-V72.84) Long standing insulin diabetic requiring significant intraabdominal surgery.  Lexiscan Myovue Orders: Nuclear Stress Test (Nuc Stress Test)  Problem # 3:  DYSPNEA (ICD-786.05) More related to obesity sleep apnea and asthma.  Echo to check RV and LV funciton.  F/U Clance Her updated medication list for this problem includes:    Micardis 40 Mg Tabs (Telmisartan) ..... One tablet daily.    Lasix 40 Mg Tabs (Furosemide) ..... Use  as needed  Patient Instructions: 1)  Your physician recommends that you schedule a follow-up appointment in: as needed, unless test are abmormal. 2)  Your physician has requested that you have an echocardiogram.  Echocardiography is a painless test that uses sound waves to  create images of your heart. It provides your doctor with information about the size and shape of your heart and how well your heart's chambers and valves are working.  This procedure takes approximately one hour. There are no restrictions for this procedure. 3)  Your physician has requested that you have an [employer]  For further information please visit [email or web address]  Please follow instruction sheet, as given.   EKG Report  Procedure date:  05/29/2009  Findings:      NSR 72 Normal ECG

## 2010-05-11 NOTE — Progress Notes (Signed)
  Faxed 12,[place]ne Letter to D[facility] [phone] [facility]  August 06, 2009 11:55 AM

## 2010-05-11 NOTE — Progress Notes (Signed)
Summary: talk to nurse  Phone Note From Other Clinic Call back at [phone] ex [phone]   Caller: Patient Caller: [facility] Call For: clance Summary of Call: calling to clarify strenght for advair hfa Initial call taken by: Rickard Patience,  November 23, 2009 3:56 PM  Follow-up for Phone Call        Spoke with Alna at [facility]-Advair 115/21 HFA inhaler 2puffs two times a day given by [facility]. Reynaldo Minium CMA  November 23, 2009 4:29 PM

## 2010-05-11 NOTE — Assessment & Plan Note (Signed)
Summary: rov for osa/emphysema   Copy to:  Gross Primary Provider/Referring Provider:  Dr. Carylon Perches  CC:  Pt is here for a f/u appt.  Pt has pending hernia surgery in a few weeks. Pt states she is wearing her cpap machine every night.  Approx 7 hours.  Pt denied any complaints with cpap machine.  Pt states breathing is unchanged from last visit.  Pt denied a cough.  Pt believes she is retaining some fluid.  Marland Kitchen  History of Present Illness: the pt comes in today for f/u of her known emphysema and osa.  She is scheduled for major hernia repair surgery next week, and overall is maintaining her usual baseline.   She is compliant on cpap, and and denies any recent acute exac. or pulm. infection.    Current Medications (verified): 1)  Novolog 100 Unit/ml Soln (Insulin Aspart) .... As Directed 2)  Glucophage 500 Mg  Tabs (Metformin Hcl) .... Four Tables Daily 3)  Enbrel 50 Mg/ml  Soln (Etanercept) .... One Injection Weekly 4)  Calcium 600/vitamin D 600-400 Mg-Unit  Tabs (Calcium Carbonate-Vitamin D) .... One Tablet Two Times A Day 5)  Micardis 40 Mg  Tabs (Telmisartan) .... One Tablet Daily. 6)  Spiriva Handihaler 18 Mcg  Caps (Tiotropium Bromide Monohydrate) .... One Inhalation Each Morning As Needed 7)  Lasix 40 Mg  Tabs (Furosemide) .... Use  As Needed 8)  Proventil Hfa 108 (90 Base) Mcg/act  Aers (Albuterol Sulfate) .Marland Kitchen.. 1-2 Puffs 4 Times Daily As Needed 9)  Ferrousul 325 (65 Fe) Mg  Tabs (Ferrous Sulfate) .Marland Kitchen.. 1 By Mouth Daily 10)  Fish Oil 1000 Mg  Caps (Omega-3 Fatty Acids) .... 2 By Mouth Daily 11)  Arthrotec 75 75-200 Mg-Mcg Tabs (Diclofenac-Misoprostol) .... Take 1 Tab Two Times A Day 12)  Fexofenadine Hcl 180 Mg  Tabs (Fexofenadine Hcl) .Marland Kitchen.. 1 By Mouth Daily 13)  Sertraline Hcl 100 Mg  Tabs (Sertraline Hcl) .Marland Kitchen.. 1 By Mouth Daily 14)  Byetta 10 Mcg Pen 10 Mcg/0.36ml  Soln (Exenatide) .... Two Times A Day 15)  Advair Hfa 115-21 Mcg/act Aero (Fluticasone-Salmeterol) .... Inhale 2 Puffs Two  Times A Day 16)  Omeprazole 40 Mg Cpdr (Omeprazole) .... One Each Am 17)  Lantus 100 Unit/ml Soln (Insulin Glargine) .... 90 Units Two Times A Day 18)  Simvastatin 40 Mg Tabs (Simvastatin) .... Take 1 Tab Daily  Allergies (verified): 1)  ! Tetracycline 2)  ! Sulfa  Review of Systems      See HPI  Vital Signs:  Patient profile:   60 year old female Height:      62 inches Weight:      299.13 pounds BMI:     54.91 O2 Sat:      94 % on Room air Temp:     98.0 degrees F oral Pulse rate:   112 / minute Cuff size:   large  Vitals Entered By: Arman Filter LPN (August 03, 2009 3:34 PM)  O2 Flow:  Room air CC: Pt is here for a f/u appt.  Pt has pending hernia surgery in a few weeks. Pt states she is wearing her cpap machine every night.  Approx 7 hours.  Pt denied any complaints with cpap machine.  Pt states breathing is unchanged from last visit.  Pt denied a cough.  Pt believes she is retaining some fluid.   Comments Medications reviewed with patient Arman Filter LPN  August 03, 2009 3:35 PM    Physical Exam  General:  obese female in nad Nose:  no skin breakdown or pressuer  Lungs:  clear to auscultation Heart:  rrr   Impression & Recommendations:  Problem # 1:  OBSTRUCTIVE SLEEP APNEA (ICD-327.23)  the pt is stable on cpap  Problem # 2:  EMPHYSEMA (ICD-492.8)  no bronchospasm on exam, and she feels breathing is at baseline.  Ok to have surgery  from a pulmonary standpoint, understanding the risks outlined at last ov.  Other Orders: Est. Patient Level II (91478)  Patient Instructions: 1)  no change in meds 2)  take cpap mask and headgear to the hospital for your surgery 3)  will check on you while in the hospital

## 2010-05-11 NOTE — Letter (Signed)
Summary: Clearance Letter  [facility]  [address], [place] [address]   Phone: [phone]  Fax: [phone]    June 15, 2009  Re:     Olga Millers Address:   [address]     [place], [place]  [address] DOB:     [birth year] MRN:     [id number]   Dear            Sincerely,  Teressa Lower RN

## 2010-05-11 NOTE — Letter (Signed)
Summary: [place] Results Engineer, agricultural at [facility]  [address], [place] [address]   Phone: [phone]  Fax: [phone]      June 11, 2009 MRN: [id number]   [name] [address] [place], [place]  [address]   Dear Ms. [name],  Your test ordered by Selena Batten has been reviewed by your physician (or physician assistant) and was found to be normal or stable. Your physician (or physician assistant) felt no changes were needed at this time.  ____ Echocardiogram  __X__ Cardiac Stress Test  ____ Lab Work  ____ Peripheral vascular study of arms, legs or neck  ____ CT scan or X-ray  ____ Lung or Breathing test  ____ Other: Please continue on current medical treatment.  Thank you.   Charlton Haws, MD, F.A.C.C

## 2010-05-11 NOTE — Letter (Signed)
Summary: [place] Results Engineer, agricultural at [facility]  [address], [place] [address]   Phone: [phone]  Fax: [phone]      June 15, 2009 MRN: [id number]   [name] [address] [place], [place]  [address]   Dear Ms. [name],  Your test ordered by Selena Batten has been reviewed by your physician (or physician assistant) and was found to be normal or stable. Your physician (or physician assistant) felt no changes were needed at this time.  ____ Echocardiogram  __X__ Cardiac Stress Test  ____ Lab Work  ____ Peripheral vascular study of arms, legs or neck  ____ CT scan or X-ray  ____ Lung or Breathing test  ____ Other: Please continue on current medical treatment.  Thank you.   Charlton Haws, MD, F.A.C.C

## 2010-05-11 NOTE — Progress Notes (Signed)
Summary: rx refill advair  Phone Note Refill Request Message from:  [facility]arm[facility] fax#[phone]  Refills Requested: Medication #1:  ADVAIR HFA 115-21 MCG/ACT AERO inhale 2 puffs two times a day RX printed and faxed to [phone]  Initial call taken by: Kandice Hams CMA,  September 30, 2009 11:27 AM    Prescriptions: ADVAIR HFA 115-21 MCG/ACT AERO (FLUTICASONE-SALMETEROL) inhale 2 puffs two times a day  #3 x 0   Entered by:   Kandice Hams CMA   Authorized by:   Barbaraann Share MD   Signed by:   Kandice Hams CMA on 09/30/2009   Method used:   Reprint   RxID:   [id number] ADVAIR HFA 115-21 MCG/ACT AERO (FLUTICASONE-SALMETEROL) inhale 2 puffs two times a day  #3 x 0   Entered by:   Kandice Hams CMA   Authorized by:   Barbaraann Share MD   Signed by:   Kandice Hams CMA on 09/30/2009   Method used:   Printed then faxed to ...       [facility])             , [place]         Ph: [phone]       Fax: [phone]   RxID:   [id number]

## 2010-05-12 LAB — GLUCOSE, CAPILLARY

## 2010-05-13 NOTE — Progress Notes (Signed)
Summary: Records Request  Faxed EKG to [place] (per Delice Bison) at [facility] ([phone]). Debby Freiberg  May 03, 2010 12:03 PM

## 2010-05-13 NOTE — Assessment & Plan Note (Signed)
Summary: rov for acute sinobronchitis   Visit Type:  Follow-up Copy to:  Gross Primary Provider/Referring Provider:  Dr. Carylon Perches  CC:  follow up. Pt states she wears her cpap everynight x 7-8 hrs a night. Pt states she is having no rpoblems with machine/mask. Pt c/o increased sob, cough w/ yellow to green phlem, wheezing, chest tightness and congestion, and post nasal drip x 2 days. [name]  History of Present Illness: The pt comes in today for an acute sick visit.  She is followed for known obstructive sleep apnea and copd, and comes in with a 3d h/o worsening sinus congestion with purulence, chest congestion, cough with purulent mucus, and worsening sob.  She had been doing well from a breathing standpoint, and also with her cpap.  Her latest download showed great compliance, and good control of her AHI.  She denies any f/c/s.    Current Medications (verified): 1)  Novolog 100 Unit/ml Soln (Insulin Aspart) .... As Directed 2)  Glucophage 500 Mg  Tabs (Metformin Hcl) .... Four Tables Daily 3)  Calcium 600/vitamin D 600-400 Mg-Unit  Tabs (Calcium Carbonate-Vitamin D) .... One Tablet Two Times A Day 4)  Micardis 40 Mg  Tabs (Telmisartan) .... One Tablet Daily. 5)  Spiriva Handihaler 18 Mcg  Caps (Tiotropium Bromide Monohydrate) .... One Inhalation Each Morning 6)  Lasix 40 Mg  Tabs (Furosemide) .... Use  As Needed 7)  Proventil Hfa 108 (90 Base) Mcg/act  Aers (Albuterol Sulfate) .[name].. 1-2 Puffs 4 Times Daily As Needed 8)  Ferrousul 325 (65 Fe) Mg  Tabs (Ferrous Sulfate) .[name].. 1 By Mouth Daily 9)  Fish Oil 1000 Mg  Caps (Omega-3 Fatty Acids) .... 2 By Mouth Daily 10)  Arthrotec 75 75-200 Mg-Mcg Tabs (Diclofenac-Misoprostol) .... Take 1 Tab Two Times A Day 11)  Fexofenadine Hcl 180 Mg  Tabs (Fexofenadine Hcl) .[name].. 1 By Mouth Daily 12)  Sertraline Hcl 100 Mg  Tabs (Sertraline Hcl) .[name].. 1 By Mouth Daily 13)  Byetta 10 Mcg Pen 10 Mcg/0.40ml  Soln (Exenatide) .... Two Times A Day 14)  Advair Hfa 115-21  Mcg/act Aero (Fluticasone-Salmeterol) .... Inhale 2 Puffs Two Times A Day 15)  Omeprazole 40 Mg Cpdr (Omeprazole) .... One Each Am 16)  Lantus 100 Unit/ml Soln (Insulin Glargine) .... 90 Units Two Times A Day 17)  Simvastatin 40 Mg Tabs (Simvastatin) .... Take 1 Tab Daily  Allergies: 1)  ! Tetracycline 2)  ! Sulfa 3)  ! * Dilaudid  Past History:  Past medical, surgical, family and social histories (including risk factors) reviewed, and no changes noted (except as noted below).  Past Medical History: Reviewed history from 02/15/2008 and no changes required.  DM (ICD-250.00) HYPERLIPIDEMIA (ICD-272.4) emphysema MORBID OBESITY (ICD-278.01) ASTHMA (ICD-493.90) rheumatoid arthritis, on methotrexate and Enbrel  Past Surgical History: Reviewed history from 08/21/2007 and no changes required. 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 Family History Emphysema --father CHF--father Lung cancer--mother Rheumatoid arthritis--father  Social History: Reviewed history from 08/21/2007 and no changes required. Patient states former smoker.  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 Lives with her daughter.  Review of Systems       The patient complains of shortness of breath with activity, productive cough, loss of appetite, nasal congestion/difficulty breathing through nose, anxiety, depression, hand/feet swelling, change in color of mucus, and fever.  The patient denies shortness of breath at rest, non-productive cough, coughing up blood, chest pain, irregular  heartbeats, acid heartburn, indigestion, weight change, abdominal pain, difficulty swallowing, sore throat, tooth/dental problems, headaches, sneezing, itching, ear ache, joint stiffness or pain, and rash.    Vital Signs:  Patient profile:   60 year old female Height:      62  inches Weight:      289.25 pounds O2 Sat:      93 % on Room air Temp:     98.6 degrees F oral Pulse rate:   100 / minute BP sitting:   124 / 78  (left arm) Cuff size:   large  Vitals Entered By: Carver Fila (April 21, 2010 12:09 PM)  O2 Flow:  Room air CC: follow up. Pt states she wears her cpap everynight x 7-8 hrs a night. Pt states she is having no rpoblems with machine/mask. Pt c/o increased sob, cough w/ yellow to green phlem, wheezing, chest tightness and congestion, post nasal drip x 2 days.  Comments meds and allergies updated Phone number updated Carver Fila  April 21, 2010 12:10 PM    Physical Exam  General:  obese female in nad Nose:  inflammed mucosa bilat along with purulent secretions in nares. Mouth:  no lesions or exudates seen Lungs:  chest with a few rhonchi, no crackles or wheezing Heart:  rrr Extremities:  mild LE edema, no cyanosis  Neurologic:  alert and oriented, moves all 4.      Impression & Recommendations:  Problem # 1:  ACUTE BRONCHITIS (ICD-466.0)  the pt is describing an episode of acute sinobronchitis.  She will need a course of abx for treatment, as well as nasal hygiene regimen.  Will treat with omnicef, and asked her to do neilmed sinus rinses as well.  There is nothing to suggest acute bronchospasm on exam today, but I think she would benefit from a short course of steroids for airway and nasal mucosal inflammation.  She isn't sure she wants to do this, but will give her the prescription and let her decide.  Problem # 2:  OBSTRUCTIVE SLEEP APNEA (ICD-327.23)  the pt is doing well on her cpap, and her download shows great compliance with good control of her AHI.  She has lost 10 pounds since her last visit here.  Medications Added to Medication List This Visit: 1)  Spiriva Handihaler 18 Mcg Caps (Tiotropium bromide monohydrate) .... One inhalation each morning 2)  Cefdinir 300 Mg Caps (Cefdinir) .... 2 each day for 7 days 3)  Prednisone  10 Mg Tabs (Prednisone) .... Take 4 each day for 2 days, then 3 each day for 2 days, then 2 each day for 2 days, then 1 each day for 2 days, then stop  Other Orders: Est. Patient Level IV (16109)  Patient Instructions: 1)  will treat with omnicef 300mg  2 each day for 7 days 2)  neilmed sinus rinses am and pm for next one week, then as needed.  Can get powder refills from drugstore 3)  will give you prescription for prednisone to help with airway inflammation. 4)  mucinex dm extra strength one in am and pm may help. 5)  keep scheduled followup with me, but call if not getting better.  Prescriptions: PREDNISONE 10 MG  TABS (PREDNISONE) take 4 each day for 2 days, then 3 each day for 2 days, then 2 each day for 2 days, then 1 each day for 2 days, then stop  #20 x 0   Entered and Authorized by:   Barbaraann Share MD  Signed by:   Barbaraann Share MD on 04/21/2010   Method used:   Print then Give to Patient   RxID:   [id number] CEFDINIR 300 MG CAPS (CEFDINIR) 2 each day for 7 days  #14 x 0   Entered and Authorized by:   Barbaraann Share MD   Signed by:   Barbaraann Share MD on 04/21/2010   Method used:   Print then Give to Patient   RxID:   [id number]    Orders Added: 1)  Est. Patient Level IV [95284]    Immunization History:  Influenza Immunization History:    Influenza:  historical (01/09/2010)

## 2010-05-14 NOTE — Op Note (Signed)
[name], [name]                  ACCOUNT NO.:  [id number]  MEDICAL RECORD NO.:  [id number]          PATIENT TYPE:  INP  LOCATION:  5003                         FACILITY:  [facility]  PHYSICIAN:  Harvie Junior, M.D.   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  POSTOPERATIVE DIAGNOSES: 1. 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 2. Morbid obesity.  PRINCIPAL PROCEDURES: 1. Right total knee replacement with a sigma system, size 3 tibia with     a MBT revision tray, 10-mm bridging bearing and a 35-mm all     polyethylene patella. 2. Complex total knee secondary to excessive BMI and difficulty with     exposure, retraction, and component placement. 3. Lateral retinacular release.  SURGEON:  Harvie Junior, MD.  ASSISTANT:  Marshia Ly, PA.  ANESTHESIAJeraldine Loots HISTORY:  [name] is a morbidly obese female who has had her left knee done some years ago.  She began having increasing right knee pain, which she failed all conservative care, and because of failure of all conservative care, she was ultimately taken to the operating room for right total knee replacement.  Because of her morbid obesity, we felt that we needed to use a larger tibial stem on the right side and our plan was to use a computer-assisted alignment because of her morbidly obese size.  We really would not be able to get the pins on the femoral side to where we can put the arrays into the case, so we had to go to old total knee replacement and proceeded the in following fashion.  The leg clamp was placed and tibia was cut perpendicular to the long axis, 2 degrees of posterior slope for the MBT revision tray.  We took about a 4-mm below the low side for this cut.  Attention was turned to the distal femur where 10-mm of distal femur were  cut from an intramedullary guide hole.  Once this was done, the spacer blocks were put in place and a 10 spacer block fit easily into the knee at this point.  Attention was then turned to the femur, which was sized to a 3. Anterior and posterior cuts were made as well as chamfers and box cut. Once that was completed, attention was turned to the patella, which was sized to a 35 and lugs were drilled.  At this point, the trial cup with the long alignment of the knee was checked and it appeared to be a little bit of subluxation of the patella and lateral retinacular release was performed.  This gave a better midline tracking especially in the deep flexion area.  At this point, attention was turned towards removal of trial components and the final components were cemented into place, size 3 femur, size 3 MBT revision tray, 35-mm All-Poly Patella, and a 10- mm bridging bearing trial was placed.  We allowed for the cement to harden.  At this point, once the cement was hardened,  we put the knee through range of motion with excellent stability.  Patella was now midline tracking.  At this point, the tourniquet was let down.  Trial poly was removed.  All bleeders were controlled with electrocautery, the final poly placed, and the knee put through a range of motion. Excellent stability range of motion.  The medial parapatellar arthrotomy was closed with 1 Vicryl running, skin with 0 and 2-0 Vicryl and skin staples.  A sterile compressive dressing was applied, and the patient was taken to recovery room where she was noted to be in satisfactory condition.  The estimated blood loss for the procedure was less than 200 mL.     Harvie Junior, M.D.     Ranae Plumber  D:  05/07/2010  T:  05/08/2010  Job:  [id number]  Electronically Signed by Jodi Geralds M.D. on 05/14/2010 05:14:01 PM

## 2010-06-09 ENCOUNTER — Inpatient Hospital Stay (HOSPITAL_COMMUNITY)
Admission: RE | Admit: 2010-06-09 | Discharge: 2010-06-14 | DRG: 486 | Disposition: A | Discharge status: Home Health | Source: Ambulatory Visit | Attending: Orthopedic Surgery | Admitting: Orthopedic Surgery

## 2010-06-09 DIAGNOSIS — M069 Rheumatoid arthritis, unspecified: Secondary | ICD-10-CM | POA: Diagnosis present

## 2010-06-09 DIAGNOSIS — M869 Osteomyelitis, unspecified: Secondary | ICD-10-CM | POA: Diagnosis present

## 2010-06-09 DIAGNOSIS — T8450XA Infection and inflammatory reaction due to unspecified internal joint prosthesis, initial encounter: Secondary | ICD-10-CM

## 2010-06-09 DIAGNOSIS — Z96659 Presence of unspecified artificial knee joint: Secondary | ICD-10-CM

## 2010-06-09 DIAGNOSIS — T8459XA Infection and inflammatory reaction due to other internal joint prosthesis, initial encounter: Secondary | ICD-10-CM | POA: Insufficient documentation

## 2010-06-09 DIAGNOSIS — T8489XA Other specified complication of internal orthopedic prosthetic devices, implants and grafts, initial encounter: Secondary | ICD-10-CM | POA: Insufficient documentation

## 2010-06-09 DIAGNOSIS — I10 Essential (primary) hypertension: Secondary | ICD-10-CM | POA: Diagnosis present

## 2010-06-09 DIAGNOSIS — E1169 Type 2 diabetes mellitus with other specified complication: Secondary | ICD-10-CM | POA: Diagnosis present

## 2010-06-09 DIAGNOSIS — M9080 Osteopathy in diseases classified elsewhere, unspecified site: Secondary | ICD-10-CM | POA: Diagnosis present

## 2010-06-09 DIAGNOSIS — G4733 Obstructive sleep apnea (adult) (pediatric): Secondary | ICD-10-CM | POA: Diagnosis present

## 2010-06-09 DIAGNOSIS — Z794 Long term (current) use of insulin: Secondary | ICD-10-CM

## 2010-06-09 DIAGNOSIS — K219 Gastro-esophageal reflux disease without esophagitis: Secondary | ICD-10-CM | POA: Diagnosis present

## 2010-06-09 DIAGNOSIS — E785 Hyperlipidemia, unspecified: Secondary | ICD-10-CM | POA: Diagnosis present

## 2010-06-09 DIAGNOSIS — Y831 Surgical operation with implant of artificial internal device as the cause of abnormal reaction of the patient, or of later complication, without mention of misadventure at the time of the procedure: Secondary | ICD-10-CM | POA: Diagnosis present

## 2010-06-09 DIAGNOSIS — F329 Major depressive disorder, single episode, unspecified: Secondary | ICD-10-CM | POA: Diagnosis present

## 2010-06-09 DIAGNOSIS — F3289 Other specified depressive episodes: Secondary | ICD-10-CM | POA: Diagnosis present

## 2010-06-09 DIAGNOSIS — M009 Pyogenic arthritis, unspecified: Secondary | ICD-10-CM | POA: Diagnosis present

## 2010-06-09 DIAGNOSIS — J441 Chronic obstructive pulmonary disease with (acute) exacerbation: Secondary | ICD-10-CM | POA: Diagnosis present

## 2010-06-09 HISTORY — PX: REVISION TOTAL KNEE ARTHROPLASTY: SUR1280

## 2010-06-09 LAB — COMPREHENSIVE METABOLIC PANEL
ALT: 32 U/L (ref 0–35)
Alkaline Phosphatase: 74 U/L (ref 39–117)
BUN: 21 mg/dL (ref 6–23)
Chloride: 102 mEq/L (ref 96–112)
Glucose, Bld: 183 mg/dL — ABNORMAL HIGH (ref 70–99)
Potassium: 4.2 mEq/L (ref 3.5–5.1)
Sodium: 137 mEq/L (ref 135–145)
Total Bilirubin: 0.4 mg/dL (ref 0.3–1.2)
Total Protein: 7 g/dL (ref 6.0–8.3)

## 2010-06-09 LAB — CBC
HCT: 32.7 % — ABNORMAL LOW (ref 36.0–46.0)
Hemoglobin: 10.2 g/dL — ABNORMAL LOW (ref 12.0–15.0)
MCH: 24.2 pg — ABNORMAL LOW (ref 26.0–34.0)
MCHC: 31.2 g/dL (ref 30.0–36.0)
MCV: 77.5 fL — ABNORMAL LOW (ref 78.0–100.0)
RDW: 16.3 % — ABNORMAL HIGH (ref 11.5–15.5)

## 2010-06-09 LAB — URINALYSIS, ROUTINE W REFLEX MICROSCOPIC
Hgb urine dipstick: NEGATIVE
Urine Glucose, Fasting: NEGATIVE mg/dL
pH: 7 (ref 5.0–8.0)

## 2010-06-09 LAB — DIFFERENTIAL
Eosinophils Relative: 0 % (ref 0–5)
Lymphocytes Relative: 8 % — ABNORMAL LOW (ref 12–46)
Lymphs Abs: 0.8 10*3/uL (ref 0.7–4.0)
Monocytes Absolute: 0.9 10*3/uL (ref 0.1–1.0)
Monocytes Relative: 9 % (ref 3–12)
Neutro Abs: 8.3 10*3/uL — ABNORMAL HIGH (ref 1.7–7.7)

## 2010-06-09 LAB — GLUCOSE, CAPILLARY: Glucose-Capillary: 174 mg/dL — ABNORMAL HIGH (ref 70–99)

## 2010-06-09 LAB — PROTIME-INR: INR: 1.24 (ref 0.00–1.49)

## 2010-06-09 LAB — GRAM STAIN

## 2010-06-09 LAB — SURGICAL PCR SCREEN
MRSA, PCR: NEGATIVE
Staphylococcus aureus: NEGATIVE

## 2010-06-10 LAB — CBC
HCT: 29.4 % — ABNORMAL LOW (ref 36.0–46.0)
Hemoglobin: 8.8 g/dL — ABNORMAL LOW (ref 12.0–15.0)
MCHC: 29.9 g/dL — ABNORMAL LOW (ref 30.0–36.0)
RBC: 3.75 MIL/uL — ABNORMAL LOW (ref 3.87–5.11)
WBC: 12 10*3/uL — ABNORMAL HIGH (ref 4.0–10.5)

## 2010-06-10 LAB — GLUCOSE, CAPILLARY: Glucose-Capillary: 176 mg/dL — ABNORMAL HIGH (ref 70–99)

## 2010-06-10 LAB — SEDIMENTATION RATE: Sed Rate: 96 mm/hr — ABNORMAL HIGH (ref 0–22)

## 2010-06-10 LAB — BASIC METABOLIC PANEL
CO2: 28 mEq/L (ref 19–32)
Calcium: 8.8 mg/dL (ref 8.4–10.5)
Chloride: 102 mEq/L (ref 96–112)
Glucose, Bld: 114 mg/dL — ABNORMAL HIGH (ref 70–99)
Sodium: 135 mEq/L (ref 135–145)

## 2010-06-10 LAB — PREPARE RBC (CROSSMATCH)

## 2010-06-10 LAB — C-REACTIVE PROTEIN: CRP: 3 mg/dL — ABNORMAL HIGH (ref <0.6)

## 2010-06-10 LAB — HEMOGLOBIN A1C: Hgb A1c MFr Bld: 7 % — ABNORMAL HIGH (ref <5.7)

## 2010-06-11 ENCOUNTER — Inpatient Hospital Stay (HOSPITAL_COMMUNITY)

## 2010-06-11 LAB — CBC
Hemoglobin: 9.2 g/dL — ABNORMAL LOW (ref 12.0–15.0)
MCH: 24 pg — ABNORMAL LOW (ref 26.0–34.0)
MCV: 78.1 fL (ref 78.0–100.0)
RBC: 3.84 MIL/uL — ABNORMAL LOW (ref 3.87–5.11)
WBC: 10 10*3/uL (ref 4.0–10.5)

## 2010-06-11 LAB — BASIC METABOLIC PANEL
CO2: 26 mEq/L (ref 19–32)
GFR calc Af Amer: >60 mL/min (ref >60)
GFR calc non Af Amer: >60 mL/min (ref >60)
Glucose, Bld: 240 mg/dL — ABNORMAL HIGH (ref 70–99)
Potassium: 4.1 mEq/L (ref 3.5–5.1)
Sodium: 137 mEq/L (ref 135–145)

## 2010-06-11 LAB — GLUCOSE, CAPILLARY: Glucose-Capillary: 151 mg/dL — ABNORMAL HIGH (ref 70–99)

## 2010-06-11 LAB — PROTIME-INR: Prothrombin Time: 17 seconds — ABNORMAL HIGH (ref 11.6–15.2)

## 2010-06-12 LAB — TYPE AND SCREEN
ABO/RH(D): A POS
Antibody Screen: NEGATIVE
Unit division: 0
Unit division: 0

## 2010-06-12 LAB — BASIC METABOLIC PANEL
CO2: 29 mEq/L (ref 19–32)
Chloride: 104 mEq/L (ref 96–112)
Creatinine, Ser: 0.58 mg/dL (ref 0.4–1.2)
GFR calc Af Amer: >60 mL/min (ref >60)
Potassium: 4 mEq/L (ref 3.5–5.1)

## 2010-06-12 LAB — CBC
Hemoglobin: 9.1 g/dL — ABNORMAL LOW (ref 12.0–15.0)
MCH: 23.5 pg — ABNORMAL LOW (ref 26.0–34.0)
MCV: 77.3 fL — ABNORMAL LOW (ref 78.0–100.0)
RBC: 3.87 MIL/uL (ref 3.87–5.11)

## 2010-06-12 LAB — GLUCOSE, CAPILLARY
Glucose-Capillary: 104 mg/dL — ABNORMAL HIGH (ref 70–99)
Glucose-Capillary: 123 mg/dL — ABNORMAL HIGH (ref 70–99)

## 2010-06-12 LAB — TISSUE CULTURE: Culture: NO GROWTH

## 2010-06-12 LAB — PROTIME-INR: INR: 1.45 (ref 0.00–1.49)

## 2010-06-12 LAB — WOUND CULTURE

## 2010-06-13 LAB — GLUCOSE, CAPILLARY
Glucose-Capillary: 94 mg/dL (ref 70–99)
Glucose-Capillary: 74 mg/dL (ref 70–99)
Glucose-Capillary: 68 mg/dL — ABNORMAL LOW (ref 70–99)

## 2010-06-13 LAB — PROTIME-INR: INR: 1.43 (ref 0.00–1.49)

## 2010-06-13 LAB — VANCOMYCIN, TROUGH: Vancomycin Tr: 14.3 ug/mL (ref 10.0–20.0)

## 2010-06-14 LAB — BASIC METABOLIC PANEL
Calcium: 8.8 mg/dL (ref 8.4–10.5)
GFR calc Af Amer: >60 mL/min (ref >60)
GFR calc non Af Amer: >60 mL/min (ref >60)
Glucose, Bld: 104 mg/dL — ABNORMAL HIGH (ref 70–99)
Sodium: 135 mEq/L (ref 135–145)

## 2010-06-14 LAB — GLUCOSE, CAPILLARY: Glucose-Capillary: 109 mg/dL — ABNORMAL HIGH (ref 70–99)

## 2010-06-14 LAB — CBC
Hemoglobin: 9.8 g/dL — ABNORMAL LOW (ref 12.0–15.0)
MCHC: 30.9 g/dL (ref 30.0–36.0)

## 2010-06-14 LAB — ANAEROBIC CULTURE

## 2010-06-14 LAB — C-REACTIVE PROTEIN: CRP: 4.4 mg/dL — ABNORMAL HIGH

## 2010-06-14 LAB — SEDIMENTATION RATE: Sed Rate: 77 mm/h — ABNORMAL HIGH (ref 0–22)

## 2010-06-14 LAB — PROTIME-INR: Prothrombin Time: 17.6 seconds — ABNORMAL HIGH (ref 11.6–15.2)

## 2010-06-15 ENCOUNTER — Encounter: Payer: Self-pay | Admitting: Internal Medicine

## 2010-06-17 ENCOUNTER — Encounter (INDEPENDENT_AMBULATORY_CARE_PROVIDER_SITE_OTHER): Payer: Self-pay | Admitting: *Deleted

## 2010-06-17 NOTE — Op Note (Signed)
[name], [name]                  ACCOUNT NO.:  [id number]  MEDICAL RECORD NO.:  [id number]           PATIENT TYPE:  I  LOCATION:  5025                         FACILITY:  [facility]  PHYSICIAN:  Harvie Junior, M.D.   DATE OF BIRTH:  [birth year]  DATE OF PROCEDURE:  06/09/2010 DATE OF DISCHARGE:                              OPERATIVE REPORT   PREOPERATIVE DIAGNOSIS:  Questionable superficial infection versus deep infection of right total knee replacement 1 month out.  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  PRINCIPAL PROCEDURE: 1. Total knee revision by way of poly swab. 2. Debridement of subcutaneous tissue, fascia, skin, and bone by way     of treatment of a complex postoperative wound infection with deep     abscess.  SURGEON:  Harvie Junior, MD  ASSISTANT:  Marshia Ly, PA  ANESTHESIA:  General.  BRIEF HISTORY:  [name] is a 60 year old female, brittle diabetic, who underwent knee replacement a little over a month ago.  She did well initially but had some sort of initial severe wound irritation which was very erythematous and red.  This ultimately began forming somewhat of an abscess over the knee superiorly, it kind look like a stitch abscess. We debrided it but it looked like it went deep, we kind opened it up and it did appear to tract deep at that point.  At that point, we tried her with the packing and some oral antibiotics but that really was not effective and we felt like we needed take her back.  Initially, we would explore this wound and also to go deep if we needed to.  She was brought to the operating room for this procedure.  PROCEDURE:  The patient was brought to the operating room.  After adequate anesthesia was obtained with general anesthetic, the patient was placed supine on the operating table.  The leg was exsanguinated after elevation for 2 minutes.  Following this, the blood pressure tourniquet was inflated to  250 mmHg.  Following this, a large ellipse was made, ellipsing the entire wound and all incumbent fat and this was done in sort of a coring wedge type fashion.  We then undermined the fat in both directions.  There was a clear and obvious opening into the knee joint where it looked as though it was just an elliptical failure just proximal to the patella.  At that point, the medial parapatellar arthrotomy was exploited proximally and distally and the wound at that point was copiously and thoroughly lavaged with 6 liters of normal saline irrigation.  We then took some Corpak and tried to get any kind of slime off the femoral and tibial components.  The poly had been removed and the full knee exposed and thorough synovectomy done prior to this.  Once we put this in, we allowed to sit for 60 seconds and then removed and then irrigated again with another 3 liters of pulsatile lavage irrigation.  We went back behind the femoral component both medially and laterally behind tibial component and we explored the medial side.  Once this  was done, we put two large bore large Hemovac drains in and then closed the medial parapatellar arthrotomy with interrupted 1 Vicryl and then a running 1 Vicryl stitch.  Once that was completed, the skin was closed with small numbers of 0 and 2-0 Vicryl and then skin staples.  A couple of retention stitches were placed to try to take some tension off the wound closure.  At this point, the wound had a sterile compressive dressing applied and the patient was taken to recovery room and she was noted to be in satisfactory condition.  Estimated blood loss for the procedure was less than 100 mL.     Harvie Junior, M.D.     Ranae Plumber  D:  06/09/2010  T:  06/10/2010  Job:  [id number]  Electronically Signed by Jodi Geralds M.D. on 06/16/2010 01:31:58 PM

## 2010-06-20 NOTE — Consult Note (Signed)
[name], DAO                  ACCOUNT NO.:  [id number]  MEDICAL RECORD NO.:  [id number]           PATIENT TYPE:  I  LOCATION:  2550                         FACILITY:  [facility]  PHYSICIAN:  Isidor Holts, M.D.  DATE OF BIRTH:  1951-02-22  DATE OF CONSULTATION:  06/09/2010 DATE OF DISCHARGE:                                CONSULTATION   PRIMARY MD:  Kingsley Callander. Wendy Sills, MD, [facility].  PRIMARY ORTHOPEDIC SURGEON:  Harvie Junior, MD  REQUESTING MD:  Harvie Junior, MD, orthopedic surgeon.  REASON FOR CONSULTATION:  Postoperative management of medical issues.  HISTORY OF PRESENT ILLNESS:  This is a 60 year old female, who was admitted on June 09, 2010, for septic arthritis of the right knee, status post right total knee replacement on May 07, 2010.  She is now in PACU, status post I and D/revision arthroplasty with wound closure.  We have been requested to manage her medical problems postoperatively.  PAST MEDICAL HISTORY: 1. End-stage osteoarthritis of right knee, status post right total     knee replacement on May 07, 2010. 2. Septic arthritis complicating end-stage osteoarthritis of right     knee above, status post I and D, revision arthroplasty and wound     closure on June 09, 2010. 3. Insulin-requiring type 2 diabetes mellitus. 4. Dyslipidemia. 5. Morbid obesity. 6. Obstructive sleep apnea syndrome. 7. COPD. 8. Bronchial asthma. 9. GERD. 10.Hypertension. 11.History of abdominal hernia repair. 12.Status post laparoscopic cholecystectomy. 13.Status post appendectomy. 14.Status post hysterectomy. 15.History of depression. 16.Rheumatoid arthritis, on methotrexate therapy.  ALLERGIES:  These are multiple; 1. SULFA. 2. DEMEROL. 3. TETRACYCLINE. 4. HYDROMORPHONE. 5. CEPHALOSPORIN.  MEDICATION HISTORY: 1. Vitamin D3 1000 units p.o. every evening. 2. Visine ophthalmic solution 2 drops each eye p.r.n. t.i.d. for dry     eyes. 3.  Spiriva inhaler (80 mcg) 1 puff daily. 4. Simvastatin 40 mg p.o. at bedtime. 5. Zoloft 100 mg p.o. q.a.m. 6. Flohale inhaler 2 puffs p.r.n. q.4 hourly for shortness of breath. 7. Omeprazole 40 mg p.o. q.a.m. 8. NovoLog insulin 6 units subcutaneously q.a.m. and 10 units     subcutaneously at lunchtime and 6 units subcutaneously at bedtime. 9. Micardis 40 mg p.o. q.a.m. 10.Methotrexate (2.5 mg) 8 tablets p.o. every Monday. 11.Metformin XR 2000 mg p.o. daily. 12.Lasix 40 mg p.o. p.r.n. daily for fluid retention. 13.Lantus insulin 90 units subcutaneously b.i.d. 14.Ibuprofen 200 mg 2-3 tablets p.o. p.r.n. q.8 hourly. 15.Fish oil 1000 mg 2 capsules p.o. q.a.m. 16.Enbrel 50 mg subcutaneously q. weekly on Mondays. 17.Byetta 10 mcg subcutaneously b.i.d. 18.Arthrotec (75 mg/200 mcg) 1 tablet p.o. b.i.d. 19.Advair Diskus (250/50) 2 puffs to b.i.d.  REVIEW OF SYSTEMS:  Patient is currently in PACU, status post surgery. Unable to obtain full systems review, however, she has no chest pain or shortness of breath.  No fever or chills.  No abdominal pain, vomitingor diarrhea.  SOCIAL HISTORY:  The patient is widowed about 7 years.  Ex-smoker, quit in 1994.  Drinks alcohol very, very occasionally.  She has no history of drug abuse.  The patient has 2 biological offspring and 1  adopted offspring.  FAMILY HISTORY:  The patient's mother is deceased at age 78 from lung cancer.  She was a smoker and was diabetic.  Her father passed at age 69 years who was diabetic, had CHF and COPD.  PHYSICAL EXAMINATION:  VITAL SIGNS:  Temperature normal, pulse 109 per minute, regular, respiratory rate 17, BP 135/60 mmHg, pulse oximeter 90% on 10 liters of Ventimask. GENERAL:  The patient did not appear to be in obvious acute distress at the time of this evaluation.  The patient complained of "burning" of the right knee.  Alert, communicative, not obviously short of breath at rest. HEENT:  No clinical pallor, no  jaundice, no conjunctival injection. Hydration status seems fair. NECK:  Supple.  JVP not seen secondary to fat neck. CHEST:  Clinically clear to auscultation.  No wheezes, no crackles. HEART:  Sounds are heard, normal, regular, no murmurs, mildly tachycardic. ABDOMEN:  Morbidly obese, otherwise soft and nontender.  Unable to palpate organs. EXTREMITIES:  Lower extremity examination, no pitting edema.  Left lower extremity and right lower extremity is in a cast. CENTRAL NERVOUS SYSTEM:  Not formally examined, however, no gross neurologic deficits on cursory examination. MUSCULOSKELETAL SYSTEM:  Not examined.  INVESTIGATIONS:  CBC, WBC 10.0, hemoglobin 10.1, hematocrit 32.7, platelets 400.  INR 1.24.  Electrolytes sodium 107, potassium 4.2, chloride 102, CO2 24, BUN 21, creatinine 0.71, glucose 186.  CBG 174. LFTs are normal.  Urinalysis is negative.  ASSESSMENT AND PLAN AND RECOMMENDATIONS: 1. Right knee septic arthritis.  The patient is status post surgery on     June 09, 2010.  Manage per primary orthopedic surgical team.     We shall defer pain management to the orthopedic surgeon.     Orthopedic team has assured me that they will consult infectious     disease specialist for antibiotic recommendations.  2. Diabetes mellitus.  This is insulin-requiring type 2, appears     reasonably controlled at the present time albeit suboptimal.  The     patient is on the rather large doses of Lantus insulin as well as     meal cover with NovoLog insulin, and also Byetta and metformin.     Based on these, it appears that her diabetes has in the past proven     somewhat difficult to control.  For now, we shall manage with sliding     scale insulin coverage until oral intake is okayed by the     orthopedic surgeons and then we shall commence the patient on     preadmission dose of Byetta, Lantus in an initial dose of 50 units     subcutaneously q.12 hours with escalation to preadmission      dosages, as permitted by CBG.  We shall commence carbohydrate     modified, heart healthy diet as soon as oral intake is tolerated.     Check HbA1c for completeness.  3. Chronic obstructive pulmonary disease/asthma. This is stable.     We should place the patient on p.r.n. bronchodilator nebulizers and     continue Spiriva handihaler, as well as Advair Diskus.  4. Dyslipidemia.  We should check lipid profile and TSH.  Meanwhile     continue fish oil and statin.  5. Hypertension.  We should restart preadmission antihypertensive     medications.  6. Gastroesophageal reflux disease.  Managed with proton pump     inhibitor.  7. History of depression.  Continue preadmission SSRI.  Thank you for the  consultation.  We will follow with you.     Isidor Holts, M.D.     CO/MEDQ  D:  06/09/2010  T:  06/09/2010  Job:  [id number]  cc:   Kingsley Callander. Wendy Sills, MD Harvie Junior, M.D.  Electronically Signed by Isidor Holts M.D. on 06/20/2010 02:36:08 PM

## 2010-06-22 NOTE — Medication Information (Signed)
Summary: [facility]:RX  [facility]:RX   Imported By: Florinda Marker 06/18/2010 12:28:32  _____________________________________________________________________  External Attachment:    Type:   Image     Comment:   External Document

## 2010-06-22 NOTE — Miscellaneous (Signed)
Summary: Problems and allergies  Clinical Lists Changes  Problems: Added new problem of OTH COMPLICATIONS DUE INTERNAL JOINT PROSTHESIS (ICD-996.77) Added new problem of INF&INFLAM REACT DUE OTH VASC DEVICE IMPLANT&GFT (FAO-130.86) Allergies: Added new allergy or adverse reaction of DEMEROL Added new allergy or adverse reaction of CEPHALOSPORINS

## 2010-06-24 LAB — ANAEROBIC CULTURE

## 2010-06-24 LAB — CBC
Hemoglobin: 12 g/dL (ref 12.0–15.0)
MCH: 25.7 pg — ABNORMAL LOW (ref 26.0–34.0)
MCV: 78.5 fL (ref 78.0–100.0)
RBC: 4.67 MIL/uL (ref 3.87–5.11)
WBC: 6.4 10*3/uL (ref 4.0–10.5)

## 2010-06-24 LAB — GLUCOSE, CAPILLARY
Glucose-Capillary: 123 mg/dL — ABNORMAL HIGH (ref 70–99)
Glucose-Capillary: 148 mg/dL — ABNORMAL HIGH (ref 70–99)
Glucose-Capillary: 148 mg/dL — ABNORMAL HIGH (ref 70–99)
Glucose-Capillary: 200 mg/dL — ABNORMAL HIGH (ref 70–99)
Glucose-Capillary: 201 mg/dL — ABNORMAL HIGH (ref 70–99)
Glucose-Capillary: 201 mg/dL — ABNORMAL HIGH (ref 70–99)
Glucose-Capillary: 287 mg/dL — ABNORMAL HIGH (ref 70–99)

## 2010-06-24 LAB — COMPREHENSIVE METABOLIC PANEL
AST: 20 U/L (ref 0–37)
Alkaline Phosphatase: 81 U/L (ref 39–117)
BUN: 17 mg/dL (ref 6–23)
CO2: 31 mEq/L (ref 19–32)
Chloride: 101 mEq/L (ref 96–112)
Creatinine, Ser: 0.78 mg/dL (ref 0.4–1.2)
GFR calc non Af Amer: >60 mL/min (ref >60)
Potassium: 4.5 mEq/L (ref 3.5–5.1)
Total Bilirubin: 0.8 mg/dL (ref 0.3–1.2)

## 2010-06-24 LAB — WOUND CULTURE

## 2010-06-27 LAB — GLUCOSE, CAPILLARY: Glucose-Capillary: 81 mg/dL (ref 70–99)

## 2010-06-28 ENCOUNTER — Telehealth (INDEPENDENT_AMBULATORY_CARE_PROVIDER_SITE_OTHER): Payer: Self-pay | Admitting: *Deleted

## 2010-06-29 LAB — GLUCOSE, CAPILLARY
Glucose-Capillary: 107 mg/dL — ABNORMAL HIGH (ref 70–99)
Glucose-Capillary: 122 mg/dL — ABNORMAL HIGH (ref 70–99)
Glucose-Capillary: 149 mg/dL — ABNORMAL HIGH (ref 70–99)
Glucose-Capillary: 179 mg/dL — ABNORMAL HIGH (ref 70–99)
Glucose-Capillary: 313 mg/dL — ABNORMAL HIGH (ref 70–99)

## 2010-06-29 LAB — COMPREHENSIVE METABOLIC PANEL
Albumin: 3.2 g/dL — ABNORMAL LOW (ref 3.5–5.2)
BUN: 15 mg/dL (ref 6–23)
CO2: 28 mEq/L (ref 19–32)
Calcium: 9.2 mg/dL (ref 8.4–10.5)
Chloride: 101 mEq/L (ref 96–112)
Creatinine, Ser: 0.7 mg/dL (ref 0.4–1.2)
GFR calc non Af Amer: >60 mL/min (ref >60)
Total Bilirubin: 0.4 mg/dL (ref 0.3–1.2)

## 2010-06-29 LAB — CBC
HCT: 34.7 % — ABNORMAL LOW (ref 36.0–46.0)
MCHC: 32.1 g/dL (ref 30.0–36.0)
MCV: 76.3 fL — ABNORMAL LOW (ref 78.0–100.0)
Platelets: 219 10*3/uL (ref 150–400)
WBC: 6.9 10*3/uL (ref 4.0–10.5)

## 2010-06-29 LAB — TISSUE CULTURE

## 2010-06-29 LAB — ANAEROBIC CULTURE

## 2010-07-04 LAB — GLUCOSE, CAPILLARY

## 2010-07-05 ENCOUNTER — Telehealth: Payer: Self-pay | Admitting: *Deleted

## 2010-07-05 NOTE — Telephone Encounter (Signed)
[name], RN with [facility] states the pt has a clogged PICC. Unable to draw blood & VERY slow to infuse the Vanco. Wants order & appt to change the line  Also starting yesterday, pt experiences hives & itching 15 minutes into infusion. Using large doses of benadryl to control. Continue to uses Vanc & benadryl?  To md for orders. Will cal Kim back at [phone].[name]

## 2010-07-06 NOTE — Telephone Encounter (Signed)
Called [facility] RN.  Notified to page MD with this question.  Pt. Has not been seen at [facility] yet.

## 2010-07-08 NOTE — Progress Notes (Signed)
  Phone Note Other Incoming   Request: Send information Summary of Call: Request for records received from [facility]. Request forwarded to [employer]. 04/11/1998     

## 2010-07-08 NOTE — Discharge Summary (Signed)
[name], [name]                  ACCOUNT NO.:  [id number]  MEDICAL RECORD NO.:  [id number]          PATIENT TYPE:  INP  LOCATION:  5003                         FACILITY:  [facility]  PHYSICIAN:  Harvie Junior, M.D.   DATE OF BIRTH:  [birth year]  DATE OF ADMISSION:  05/07/2010 DATE OF DISCHARGE:  05/11/2010                              DISCHARGE SUMMARY   ADMITTING DIAGNOSES: 1. End-stage degenerative joint disease, right knee. 2. Insulin-dependent diabetes mellitus. 3. Morbid obesity. 4. Gastroesophageal reflux disease. 5. Chronic obstructive pulmonary disease. 6. Depression. 7. Hypertension. 8. Asthma. 9. Chronic abdominal wounds, status post hernia repair in the past.  DISCHARGE DIAGNOSES: 1. End-stage degenerative joint disease, right knee. 2. Insulin-dependent diabetes mellitus. 3. Morbid obesity. 4. Gastroesophageal reflux disease. 5. Chronic obstructive pulmonary disease. 6. Depression. 7. Hypertension. 8. Asthma. 9. Chronic abdominal wounds, status post hernia repair in the past.  PROCEDURES IN HOSPITAL:  Right total knee arthroplasty, computer- assisted, Jodi Geralds, MD, May 07, 2010.  BRIEF HISTORY:  Ms. Nies is a 60 year old female who presents to our office with a long history of degenerative arthritis of the right knee with weightbearing x-rays that showed bone-on-bone changes.  She had night pain and pain with ambulation.  She got no relief with exhaustive conservative treatment including injection therapy, management of her activity status and attempts at weight reduction.  She is a very large individual and ultimately has failed nonoperative treatment related to her arthritic right knee.  She was admitted for a right total knee arthroplasty.  PERTINENT LABORATORY STUDIES:  The patient's hemoglobin on admission was 12.5, hematocrit 40.5, WBC 12.3.  On postop day #1, her hemoglobin was 10.7, postop day #2 hemoglobin was 8.7, postop day #3 hemoglobin  was stable at 8.7.  Protime on admission was 13.3 seconds with an INR of 0.99 with a PTT of 27.  On the day of discharge on Coumadin therapy, her protime was 19.7 seconds with an INR of 1.65.  On admission, her CMET was in normal range.  She had an elevated glucose of 166, sodium 138 and potassium 4.1.  This was followed on a daily basis for three postoperative days and remained in stable range with a slightly decreased sodium of 128 on May 09, 2010.  Her GFR was greater 60. Her total albumin was decreased at 3.2.  Her hemoglobin A1c on admission was 8.9.  Urinalysis on admission showed 7-10 wbc's per high-powered field with rare bacteria with mucus present.  Her CBGs were maintained through the hospitalization and ranged from 230 down to 126.  HOSPITAL COURSE:  The patient was admitted to the hospital, was given a gram of Ancef and 80 mg of gentamicin preoperatively.  She was taken to the operating room where she underwent right total knee replacement as well described at Dr. Luiz Blare' operative note.  Postoperatively, she was given a gram of Ancef q.8 h. x3 doses per hospital protocol. Preoperatively, a Foley catheter was placed in her bladder, this was removed on postop day #1.  On postop day #1, she was doing fairly well. She had complaints of pain in  her right leg.  She has gotten out of bed with physical therapy, weightbearing as tolerated.  CPM machine was used for range of motion and a Coumadin therapy was instituted medially and postoperatively for DVT prophylaxis.  The patient denied dizziness or lightheadedness.  Her vital signs were stable on postop day #2.  The drain was removed and the dressing was changed.  She had some tachycardia with some hypotension secondary to hypovolemia and acute blood loss.  Therefore, IV fluid was increased.  On postoperative day #3, she was doing well.  She had minimal dizziness.  Her hemoglobin was stable.  Her CMET was stable and her INR was  1.56.  She was making slow progress with physical therapy.  She had a large individual.  Her IV was converted to a heparin lock.  She continued on Coumadin for DVT prophylaxis.  On postop day #4, she was without complaints, she is taking fluids and voiding without difficulty.  She had had a bowel movement.  She had a fever the night prior of 100.8, was then found to be afebrile.  Her vital signs were stable.  Her INR was 1.65.  Her right knee wound was entirely benign.  Her calf was softer.  CBGs were ranging between 180 and 160.  Her saline lock discontinued.  She was discharged to home in improved condition.  She is on a diabetic diet.  She will need home health physical therapy and occupational therapy and home CPM machine for range of motion of the knee.  Her medications at discharge include: 1. Lantus insulin 90 units b.i.d. 2. Advair Diskus two puffs b.i.d. 3. Visine eye drops two drops in both eyes three times a day as     needed. 4. Vitamin D3 1000 units one q. evening. 5. Lasix 40 mg one tablet daily as needed. 6. Albuterol inhaler two puffs q.4 h. p.r.n. 7. Spiriva 18 mcg one capsule inhaled daily. 8. Fish oil 1000 mg two capsules nightly. 9. Omeprazole 40 mg q.a.m. 10.Micardis 40 mg one q.a.m. 11.Simvastatin 40 mg nightly. 12.Sertraline 100 mg q.a.m. 13.Metformin 500 mg four tablets by mouth daily. 14.NovoLog sliding scale insulin as directed. 15.Byetta 10 mcg injection subcu b.i.d. 16.Percocet 5 mg one to two every 4-6 h. p.r.n. pain. 17.Robaxin 750 mg one q.8 h. p.r.n. spasm. 18.Coumadin as directed per pharmacy protocol x1 month postop.  She will need home health physical therapy, home CPM and home health RN for dressing changes as needed and Coumadin management for DVT prophylaxis x1 1 month postop.  She will follow up with Dr. Luiz Blare in the office in 10-14 days.     Marshia Ly, P.A.   ______________________________ Harvie Junior, M.D.    JB/MEDQ   D:  06/07/2010  T:  06/08/2010  Job:  [id number]  cc:   Kingsley Callander. [name], MD  Electronically Signed by Marshia Ly P.A. on 07/07/2010 12:20:06 PM Electronically Signed by Jodi Geralds M.D. on 07/08/2010 09:38:06 AM

## 2010-07-14 LAB — GLUCOSE, CAPILLARY: Glucose-Capillary: 105 mg/dL — ABNORMAL HIGH (ref 70–99)

## 2010-07-20 ENCOUNTER — Other Ambulatory Visit: Payer: Self-pay | Admitting: *Deleted

## 2010-07-20 ENCOUNTER — Ambulatory Visit (INDEPENDENT_AMBULATORY_CARE_PROVIDER_SITE_OTHER): Admitting: Internal Medicine

## 2010-07-20 ENCOUNTER — Encounter: Payer: Self-pay | Admitting: Internal Medicine

## 2010-07-20 VITALS — BP 164/84 | HR 96 | Temp 98.0°F | Ht 62.0 in | Wt 269.5 lb

## 2010-07-20 DIAGNOSIS — T8459XA Infection and inflammatory reaction due to other internal joint prosthesis, initial encounter: Secondary | ICD-10-CM

## 2010-07-20 DIAGNOSIS — T8450XA Infection and inflammatory reaction due to unspecified internal joint prosthesis, initial encounter: Secondary | ICD-10-CM

## 2010-07-20 DIAGNOSIS — Z96659 Presence of unspecified artificial knee joint: Secondary | ICD-10-CM

## 2010-07-20 DIAGNOSIS — J45909 Unspecified asthma, uncomplicated: Secondary | ICD-10-CM

## 2010-07-20 DIAGNOSIS — M129 Arthropathy, unspecified: Secondary | ICD-10-CM

## 2010-07-20 DIAGNOSIS — M009 Pyogenic arthritis, unspecified: Secondary | ICD-10-CM

## 2010-07-20 DIAGNOSIS — M1990 Unspecified osteoarthritis, unspecified site: Secondary | ICD-10-CM

## 2010-07-20 MED ORDER — FLUTICASONE-SALMETEROL 250-50 MCG/DOSE IN AEPB: 2.0000 | INHALATION_SPRAY | Freq: Two times a day (BID) | RESPIRATORY_TRACT | Status: DC

## 2010-07-20 MED ORDER — DICLOXACILLIN SODIUM 500 MG PO CAPS: 500.0000 mg | ORAL_CAPSULE | Freq: Four times a day (QID) | ORAL | Status: AC

## 2010-07-20 MED ORDER — VANCOMYCIN HCL 1000 MG IV SOLR: 1000.0000 mg | Freq: Every day | INTRAVENOUS | Status: DC

## 2010-07-20 MED ORDER — DICLOXACILLIN SODIUM 500 MG PO CAPS: 500.0000 mg | ORAL_CAPSULE | Freq: Four times a day (QID) | ORAL | Status: DC

## 2010-07-20 MED ORDER — ISONIAZID-RIFAMPIN 150-300 MG PO CAPS: 2.0000 | ORAL_CAPSULE | Freq: Every day | ORAL | Status: DC

## 2010-07-20 MED ORDER — METHOCARBAMOL 750 MG PO TABS: 750.0000 mg | ORAL_TABLET | Freq: Three times a day (TID) | ORAL | Status: DC | PRN

## 2010-07-20 NOTE — Assessment & Plan Note (Signed)
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 I will stop the vancomycin and have the PICC pulled and switch her to oral dicloxacillin and have her continue the rifampin. I think that it might be okay to consider restarting her methotrexate and Enbrel but will leave that decision up to Dr. Jimmy Footman her rheumatologist. The dicloxacillin can increase methotrexate levels and this will need to be taken into consideration if she does restart it. I do not believe she has a true cephalosporin allergy but she does not appear he did take Keflex so that is why would use dicloxacillin. I will plan on a total of 6 months of antibiotic therapy.

## 2010-07-20 NOTE — Progress Notes (Signed)
  Subjective:    Patient ID: ARACELLY TENCZA, female    DOB: 1951/01/31, 60 y.o.   MRN: [id number]  HPI Ms. Birdena Crandall is in for her hospital followup visit. She is now completing her sixth week of IV vancomycin and oral rifampin for her staph aureus prosthetic right knee infection. Her isolate was MSSA but vancomycin was used because she has a history of intolerance of Keflex. On further questioning she says that she always develops cellulitis of both legs when she takes Keflex so she avoids it. However she tolerates Augmentin. She is feeling much better and having much less pain in her right knee. She has not had any problems with the vancomycin or her PICC. She does have a little bit of intermittent nausea with her rifampin but feels like she can tolerate it. She has not been taking her Enbrel or methotrexate since December and thinks her arthritis may be beginning to flare up slightly.    Review of Systems     Objective:   Physical Exam  Constitutional: No distress.  Musculoskeletal:       Her right knee incision has healed nicely. The knee is only slightly warm.  Skin:       Her right arm PICC site looks good          Assessment & Plan:

## 2010-07-20 NOTE — Progress Notes (Signed)
Order to remove PICC line obtained from Dr. Orvan Falconer.  Pt. identified with name and date of birth.  PICC dressing removed, site unremarkable.  Sutures removed.  PICC line removed using sterile procedure @ 1600 pm.   PICC length equal to that noted in pt's hospital chart of 41 cm.  Sterile petroleum gauze + sterile 4X4 applied to PICC site, pressure applied for 10 minutes and covered with Medipore tape as a pressure dressing.  Pt. instructed to limit use of arm for 1 hour.  Pt. instructed that the pressure dressing should remain in place for 24 hours.  Pt. verablized understanding of these instructions.  Jennet Maduro, RN

## 2010-07-20 NOTE — Patient Instructions (Signed)
I will stop the vancomycin today and have the PICC pulled. Please start the new antibiotic, dicloxacillin, and continue your rifampin.

## 2010-07-23 ENCOUNTER — Other Ambulatory Visit: Payer: Self-pay | Admitting: Pulmonary Disease

## 2010-07-28 ENCOUNTER — Other Ambulatory Visit: Payer: Self-pay | Admitting: Internal Medicine

## 2010-07-28 DIAGNOSIS — Z1231 Encounter for screening mammogram for malignant neoplasm of breast: Secondary | ICD-10-CM

## 2010-08-03 NOTE — Discharge Summary (Signed)
[name], [name]                  ACCOUNT NO.:  [id number]  MEDICAL RECORD NO.:  [id number]           PATIENT TYPE:  I  LOCATION:  5025                         FACILITY:  [facility]  PHYSICIAN:  Harvie Junior, M.D.   DATE OF BIRTH:  1951/02/04  DATE OF ADMISSION:  06/09/2010 DATE OF DISCHARGE:  06/14/2010                              DISCHARGE SUMMARY   ADMITTING DIAGNOSES: 1. Septic arthritis right knee with deep wound infection right knee 5     weeks status post right total knee arthroplasty. 2. Morbid obesity. 3. Severe insulin-dependent diabetes mellitus. 4. Gastroesophageal reflux disease. 5. Chronic obstructive pulmonary disease. 6. Depression. 7. Hypertension. 8. Chronic abdominal wound status post hernia repair in past.  DISCHARGE DIAGNOSES: 1. Septic arthritis right knee with deep wound infection right knee 5     weeks status post right total knee arthroplasty. 2. Morbid obesity. 3. Severe insulin-dependent diabetes mellitus. 4. Gastroesophageal reflux disease. 5. Chronic obstructive pulmonary disease. 6. Depression. 7. Hypertension. 8. Chronic abdominal wound status post hernia repair in past.  PROCEDURES IN HOSPITAL: 1. Revision arthroplasty right knee with revision of polyethylene     tibial component. 2. Extensive excision of scar tissue, synovium and infection with     irrigation and debridement right knee, Jodi Geralds MD, June 09, 2010.  CONSULTATIONS IN HOSPITAL:  Internal Medicine, Dr. Isidor Holts of [facility] and Infectious Disease, Cliffton Asters, MD  BRIEF HISTORY:  [name] is a pleasant 60 year old morbidly obese insulin- dependent female who is 5 weeks status post right total knee replacement.  Two weeks prior to this admission, her right knee wound opened up with some drainage.  She had redness and she was cultured which showed sensitive staph aureus.  She was put on the appropriate oral antibiotics and had complaints of  persistent wound drainage with concerns for deep intra-articular infection.  Plain x-rays showed no abnormalities with prosthesis though we were able to take a Q-tip type swab and place it all the way into her knee joint.  This was about 6 inches in depth from her skin.  Based upon her clinical findings, we felt that she was a candidate for a right total knee irrigation and debridement for an infection and secondary wound closure and revision polyethylene swap of the tibial component.  She is admitted for this.  PERTINENT LABORATORY STUDIES:  The patient's hemoglobin on admission was 10.2, hematocrit 32.7.  Her WBC was 10.0.  On postop #1, her hemoglobin was 8.8, #2 9.2, #3 9.1, #4 9.8.  Platelet count was within normal range through the hospitalization.  Her sed rate on admission was 96, on June 14, 2010, it was 12.  Pro time on admission was 15.8 seconds and INR 1.24, a PTT of 27.  On the day of discharge on Coumadin therapy, her INR was 1.43.  CMET on admission showed an elevated glucose of 183, her potassium was 4.2.  BMETs were followed on daily basis and were within normal range through her hospitalization.  Her EGFR was greater than 60, H-time checked.  Her hemoglobin A1c was  7.0 on June 09, 2010.  C- reactive protein on day of admission was 3.0.  CRP on June 14, 2010, was 4.4.  Urinalysis on admission showed no abnormalities.  Gram-stain showed few gram-positive cocci in pairs at the time of her surgery.  She grew staph aureus sensitive to everything except Septra and penicillin. Tissue cultures were negative.  She had no anaerobes isolated.  Her CBGs were followed on a daily basis and ranged from 182 down to 74.  HOSPITAL COURSE:  The patient was admitted to the hospital where she underwent surgery on her right knee as well described in Dr. Luiz Blare' operative note.  Once the cultures were taken intraoperatively, she was given IV vancomycin and vancomycin was ordered  postoperatively until the Infectious Disease doctors could evaluate her and make recommendations based upon the cultures.  She was given a PCA morphine pump for pain control, Physical Therapy for walk or ambulation, but no range of motion of her knee was ordered.  Dr. Brien Few evaluated the patient to manage her diabetes and other medical issues ans this was greatly appreciated. Infectious Disease physicians, Dr. Ninetta Lights and Orvan Falconer evaluated the patient and had recommended discontinuing her Enbrel and methotrexate which had already been done to help wound healing.  On postop day #1, she had right knee pain, no significant nausea.  Her vital signs were stable.  Her hemoglobin was 8.8.  Her INR was 1.26 on Coumadin for DVT prophylaxis.  She had good urine output.  Her Foley catheter was discontinued.  She was continued on IV vancomycin per Pharmacy per Infectious Disease.  On postop day #2, Infectious Disease suggested continuing vancomycin and adding rifampin.  The patient was awake and alert.  Her vital signs were stable.  She was afebrile.  Her hemoglobin was 9.2.  Her BMET within normal range.  Her INR was 1.36.  A PICC line was ordered for IV access and long-term IV antibiotics.  The patient was improving.  Dr. Orvan Falconer suggested continuing on IV vancomycin and rifampin.  PICC line was placed without complications.  The patient had a stable hemoglobin at 9.1.  She continued with Physical Therapy.  On postop day #5, she was without complaints.  She was taking fluids and voiding without difficulty.  Her temperature was 99.7.  She was afebrile.  Her right knee dressing was clean and dry.  Her wound was entirely benign, looked great with no significant redness.  Her staples were intact.  Her hemoglobin was 9.8, WBC 12.3, INR 1.43.  BMET within normal range.  She was discharged home in improved condition.  She will be on a diabetic diet.  She will need home health physical therapy with no  range of motion of the knee basically walk or ambulation.  Dr. Orvan Falconer will range for an Infectious Disease Clinic followup and suggested she continue on IV vancomycin with Rocephin IV through July 21, 2010, then switch to p.o. regimen.  MEDICATIONS AT DISCHARGE: 1. Vitamin D3 1000 units one at bedtime. 2. Visine 2 drops at both eyes three times a day as needed. 3. Spiriva 18 mcg 1 puff daily. 4. Simvastatin 40 mg one daily at  bedtime. 5. Sertraline 100 mg q.a.m. 6. ProAir albuterol inhaler 2 puffs inhaled q.4 h. p.r.n. 7. Omeprazole 40 mg by mouth q.a.m. 8. NovoLog 6-10 units subcu t.i.d. 9. Micardis 40 mg one q.a.m. 10.Metformin XR 5 mg 4 tablets daily. 11.Lasix 40 mg one q.a.m. as needed. 12.Lantus insulin 90 units b.i.d. 13.Fish oil 1000  mg 2 capsules q.a.m. 14.Byetta 10 mcg one injection subcu b.i.d. 15.Advair Diskus 250/50 two puffs inhaled b.i.d. 16.Percocet 5 mg one or two every 6 hours as needed for pain. 17.Robaxin 750 mg one every 8 hours p.r.n. spasm. 18.Coumadin as directed per Pharmacy x1 month. 19.IV vancomycin and rifampin as prescribed by Dr. Orvan Falconer.  FOLLOWUP:  The patient will follow up with Dr. Luiz Blare in the office in 2 weeks, follow up with Dr. Orvan Falconer as his clinic notifies the patient.     Marshia Ly, P.A.   ______________________________ Harvie Junior, M.D.    JB/MEDQ  D:  07/07/2010  T:  07/08/2010  Job:  [id number]  cc:   Kingsley Callander. [name], MD Cliffton Asters, M.D.  Electronically Signed by Marshia Ly P.A. on 07/28/2010 12:58:19 PM Electronically Signed by Jodi Geralds M.D. on 08/03/2010 08:35:25 PM

## 2010-08-12 ENCOUNTER — Encounter: Payer: Self-pay | Admitting: Internal Medicine

## 2010-08-23 ENCOUNTER — Encounter: Payer: Self-pay | Admitting: Internal Medicine

## 2010-08-24 NOTE — Procedures (Signed)
[name], AMADON NO.:  [id number]   MEDICAL RECORD NO.:  [id number]          PATIENT TYPE:  OUT   LOCATION:  SLEEP CENTER                 FACILITY:  [facility]   PHYSICIAN:  Barbaraann Share, MD,FCCPDATE OF BIRTH:  06[birth year]50   DATE OF STUDY:  09/02/2007                            NOCTURNAL POLYSOMNOGRAM   REFERRING PHYSICIAN:  Barbaraann Share, MD,FCCP   INDICATION FOR STUDY:  Hypersomnia with sleep apnea.   EPWORTH SLEEPINESS SCORE:  11.   SLEEP ARCHITECTURE:  The patient had a total sleep time of 401 minutes  with very little slow-wave sleep and decreased REM.  Sleep onset latency  was normal and REM onset was prolonged at 167 minutes.  Sleep efficiency  was decreased at 90%.   RESPIRATORY DATA:  The patient was found to have 4 obstructive apneas  and 57 obstructive hypopneas for an apnea-hypopnea index of 9 events per  hour.  The events were not positional but they did occur more  prominently during REM.  Very loud snoring was noted throughout.   OXYGEN DATA:  The patient had O2 desaturation as low as 70% with her  respiratory events.  In total, she spent 157 minutes less than 88%  during the night.   CARDIAC DATA:  No clinically significant arrhythmias were noted.   MOVEMENT-PARASOMNIA:  The patient was found to have no significant leg  jerks or abnormal behaviors.   IMPRESSIONS-RECOMMENDATIONS:  Mild obstructive sleep apnea/hypopnea  syndrome with an apnea-hypopnea index of 9 events per hour, but  significant oxygen desaturation as low as 70%.  Treatment for this  degree of sleep apnea can include weight loss alone if applicable,  overweight surgery or appliance, and also CPAP.  Clinical correlation is  suggested.      Barbaraann Share, MD,FCCP  Diplomate, American Board of Sleep  Medicine  Electronically Signed     [facility]/MEDQ  D:  09/20/2007 08:36:46  T:  09/20/2007 09:05:50  Job:  [id number]

## 2010-08-24 NOTE — Discharge Summary (Signed)
[name], [name]                  ACCOUNT NO.:  [id number]   MEDICAL RECORD NO.:  [id number]          PATIENT TYPE:  INP   LOCATION:  1512                         [facility]ACILITY:  [facility]   PHYSICIAN:  Barbaraann Share, MD,FCCPDATE OF BIRTH:  1950-12-03   DATE OF ADMISSION:  02/15/2008  DATE OF DISCHARGE:  02/20/2008                               DISCHARGE SUMMARY   DIAGNOSES AT DISCHARGE:  1. Asthmatic bronchitis.  2. Acute respiratory failure related to #1.  3. Diabetes mellitus with poor control after initiation of steroids.   PROCEDURES PERFORMED:  None.   HISTORY OF PRESENT ILLNESS:  Please see H and P at the time of  admission.   HOSPITAL COURSE:  The patient was admitted to the office with acute  respiratory failure, respiratory distress related to acute asthmatic  bronchitis.  She was placed on aggressive bronchodilators as well as IV  antibiotics and also IV steroids.  She was placed on her usual diabetic  regimen as well as sliding scale insulin and had moderately increased  sugars during her hospitalization, but these were reasonably controlled.  The patient began to have significant improvement.  By the day of  discharge was totally weaned from supplemental oxygen.  She had an  improvement in her cough, congestion, and felt like she was returning  back to her baseline.  By the day of discharge she had been converted to  both oral antibiotics and prednisone, and was ambulating in the hall  without significant distress.  She is being discharged at this time in a  much improved condition.   DISCHARGE INSTRUCTIONS:  Activities as tolerated.  Diet is low-sodium,  heart-healthy with diabetic restrictions.   FOLLOWUP:  With me in 1 week.  I left my number for the patient to call  the office today to get an appointment.   DISCHARGE MEDICATIONS:  Her usual home meds.  The new medications will  be:   1. Avelox 400 mg p.o. daily for 3 more days.  2. Also prednisone in a tapering  fashion starting at 30 mg a day and      tapering off over 6 days.      Barbaraann Share, MD,FCCP  Electronically Signed     [facility]/MEDQ  D:  02/20/2008  T:  02/20/2008  Job:  [id number]   cc:   Kingsley Callander. [name], MD  Fax: [phone]

## 2010-08-27 NOTE — Op Note (Signed)
[name], [name]                  ACCOUNT NO.:  [id number]   MEDICAL RECORD NO.:  [id number]          PATIENT TYPE:  AMB   LOCATION:  DSC                          FACILITY:  [facility]   PHYSICIAN:  Cindee Salt, M.D.       DATE OF BIRTH:  08-Sep-1950   DATE OF PROCEDURE:  08/11/2005  DATE OF DISCHARGE:                                 OPERATIVE REPORT   PREOPERATIVE DIAGNOSIS:  Mass right thumb.   POSTOPERATIVE DIAGNOSIS:  Mass right thumb.   OPERATION:  Excision mass right thumb.   SURGEON:  Cindee Salt, M.D.   ASSISTANT:  Carolyne Fiscal R.N.   ANESTHESIA:  Forearm based IV regional.   HISTORY:  The patient is a 60 year old female with a history of a large mass  on the volar ulnar aspect of the pulp area of her thumb interphalangeal  joint.  This has become painful for her.  She is desirous of excision being  aware of risks and complications including the potential for injury to the  ulnar digital artery and nerve, numbness and tingling, recurrence and  infection.  She does have a history of rheumatoid arthritis.   PROCEDURE:  The patient is brought to the operating room where a forearm  based IV regional anesthetic was carried out without difficulty.  She was  prepped using DuraPrep, supine position, right arm free.  A mid lateral  incision was made distally and carried down through subcutaneous tissue.  A  multilobulated yellowish-tan tumor was immediately apparent.  The wound was  then extended in a palmar radial direction across the proximal phalanx to  allow visualization of the digital nerve which was not easily identified.  This was found to be the radial aspect of the mass and this was protected.  Retractors were placed. With blunt and sharp dissection, the mass was  dissected free and found to be intimately involved with the joint.  The  joint was not opened and no communication was apparent to the joint through  the capsule.  The specimen was sent to pathology.  It measured  approximately  2 by 1.5 cm in diameter. No further lesions were identified.  The wound was  irrigated.  The skin was then closed with interrupted 5-0 nylon sutures.  A  sterile compressive dressing and splint to the thumb applied.  The patient  was taken to the recovery room for observation in satisfactory condition.  She is discharged home to return to the [facility] in one  week on Vicodin.           ______________________________  Cindee Salt, M.D.     GK/MEDQ  D:  08/11/2005  T:  08/11/2005  Job:  [id number]

## 2010-08-27 NOTE — Discharge Summary (Signed)
[name], [name]                  ACCOUNT NO.:  [id number]   MEDICAL RECORD NO.:  [id number]          PATIENT TYPE:  INP   LOCATION:  5013                         FACILITY:  [facility]   PHYSICIAN:  Harvie Junior, M.D.   DATE OF BIRTH:  May 23, 1950   DATE OF ADMISSION:  03/24/2004  DATE OF DISCHARGE:  03/29/2004                                 DISCHARGE SUMMARY   ADMITTING DIAGNOSES:  1.  End stage degenerative joint disease, left knee.  2.  History of rheumatoid arthritis.  3.  Hypertension.  4.  Asthma.  5.  Type 2 diabetes mellitus.  6.  Morbid obesity.  7.  History of depression.  8.  Hyperlipidemia.  9.  Chronic anemia.   DISCHARGE DIAGNOSES:  1.  End stage degenerative joint disease, left knee.  2.  History of rheumatoid arthritis.  3.  Hypertension.  4.  Asthma.  5.  Type 2 diabetes mellitus.  6.  Morbid obesity.  7.  History of depression.  8.  Hyperlipidemia.  9.  Chronic anemia.  10. Cellulitis, left leg.   PROCEDURES IN HOSPITAL:  Left total knee arthroplasty, Jodi Geralds, M.D.,  March 24, 2004.   BRIEF HISTORY:  [name] is a pleasant 60 year old female who has a long  history of left knee pain.  She has night pain and pain on ambulation.  X-  rays show that she has bone on bone arthritis of the left knee.  Because of  her radiographic and clinic findings she was felt to be a candidate for a  left total knee arthroplasty and she failed exhaustive conservative  treatment including medication, injection therapy and modification of her  activity.  Based on all these findings she was felt to be a candidate for a  total knee arthroplasty and  is admitted for this.   LABORATORY DATA:  Venous Doppler on March 29, 2004 showed no evidence of  DVT, thrombosis or Baker's cyst bilaterally.  EKG on August 08, 2003 showed  normal sinus rhythm with nonspecific T-wave abnormality.  Postoperative x-  ray of the left knee showed satisfactory postoperative appearance of  the  left total knee prosthesis.  Chest x-ray on August 08, 2003 showed no acute  disease.  Hemoglobin on admission was 13.7, hematocrit 40.8, WBC 11.8.  Postoperative day No. 1 her hemoglobin was 10.2, postoperative day No. 2  8.8, postoperative day No. 3 9.2.  Pro time on admission was 13.4 seconds  with an INR of 1.0 and a PTT of 29.  On the date of discharge her pro time  was 18.7 seconds with an INR of 1.9 on Coumadin therapy.  CMET on admission was within normal limits other than elevated glucose at  124.  BMET on postoperative day No. 1 showed sodium of 131, potassium 4.1.  On postoperative day No. 2 her sodium was 131, glucose of 178, calcium of  8.0.  Urinalysis on admission showed no abnormalities.   HOSPITAL COURSE:  The patient underwent left total knee arthroplasty which  is well described in Dr. Luiz Blare' operative not eon  March 24, 2004.  Postoperatively she was put on all of her home medications, a PCA morphine  pump for pain control and ANCEF 1 gram IV q.8h x5 doses.  She was put on  sliding scale insulin.  On postoperative day No. 1 she was without  complaints other than knee pain.  She was gotten out of bed into a chair.  Hemoglobin was 10.7.  Her INR was 1.2.  Her lungs were clear to  auscultation.  She was mobilized for physical therapy, weight bearing as  tolerated on the left with a walker.  She was started on Coumadin therapy  per pharmacy protocol until one month postop.  On postoperative No. 2 she  was afebrile, her vital signs were stable.  She had complaints of moderate  knee pain.  Her dressing was changed and her Hemovac drain was pulled.  Her  IV was continued.  On postoperative No. 3 she still had some moderate pain  and spiked a fever up to 100.9.  Her hemoglobin was 9.2.  Her CBGs were 168.  She had some scant serous drainage but no purulent.  She had minimal  effusion.  Her IV was discontinued with physical therapy.  A CPM machine was  also used  postoperatively for range of motion of the knee.  On postoperative  day No. 4, she had some increased redness around the knee.  T-max was 101.0.  We did not feel that she had a wound infection but some mild cellulitis.  Her INR was 1.6.  She had some pitting edema in her leg with a negative  Homan's.  She was put on Augmentin 875 mg b.i.d. for this.  She continued  with physical therapy.  On postoperative day No. 5, March 29, 2004 she  had some mild left calf tenderness and pain.  She had swelling in her leg.  Her knee overall felt good.  She was taking fluids and voiding without  difficulty.  She spiked a fever up to 100.2, was then found to be afebrile  with vital signs stable.  She did have some tenderness in her left calf.  Her INR was 1.9.  We ordered a venous Doppler which was negative and based  upon this being negative we thought that we could discharge her home, which  was done.  She was discharged home in improved condition.  She is on regular  home diabetic diet, all of her usual home medications.  She was given a  prescription for Percocet 5 mg p.r.n. pain with no refills, Coumadin per  pharmacy protocol x1 month postop, shooting for an INR of 2.0, Augmentin 875  mg b.i.d. x10 days and Droxicam 150 mg one daily x3 days for a vaginal yeast  infection.  She was instructed to ambulate with weight bearing as tolerated  on the left with a walker.  She will get home health physical therapy, home  CPM and Coumadin management.  She will follow up with Dr. Luiz Blare in the  office in 10 days, sooner if any problems occur.   DISPOSITION:   DISCHARGE MEDICATIONS:   SUMMARY:      JB/MEDQ  D:  07/02/2004  T:  07/03/2004  Job:  [id number]   cc:   Lemmie Evens, M.D.  [address]  [place]  [place] [address]  Fax: [phone]   Kingsley Callander. [name], MD  [address]  [place]  [place] [address]  Fax: [phone]

## 2010-08-27 NOTE — Discharge Summary (Signed)
NAMESHEALEE, [name]                  ACCOUNT NO.:  [id number]   MEDICAL RECORD NO.:  [id number]          PATIENT TYPE:  INP   LOCATION:  A319                          FACILITY:  [facility]   PHYSICIAN:  Dalia Heading, M.D.  DATE OF BIRTH:  Sep 03, 1950   DATE OF ADMISSION:  12/16/2005  DATE OF DISCHARGE:  09/11/2007LH                                 DISCHARGE SUMMARY   AGE:  60 years old.   HOSPITAL COURSE:  The patient is a 60 year old white female who presented to  the emergency room with worsening abdominal pain, nausea, vomiting.  She was  found on CT scan of the abdomen and pelvis to have an incisional hernia.  Initially, it appeared to be obstructive, though it did reduce on its own.  The patient was admitted to the hospital for control of her diabetes and her  COPD.  She subsequently underwent an incisional herniorrhaphy with mesh on  December 19, 2005.  She tolerated the procedure well. Her postoperative  course was unremarkable.  Her diet was advanced without difficulty.   The patient is being discharged home on postoperative day #1 in good  improving condition.   DISCHARGE INSTRUCTIONS:  The patient is to follow up with Dr. Franky Macho  on December 29, 2005.   DISCHARGE MEDICATIONS:  Darvocet-N 100 1-2 tablets p.o. q.4 h p.r.n. pain.  She is to resume all her other medications as previously prescribed.   PRINCIPAL DIAGNOSES:  1. Incisional hernia.  2. Insulin-dependent diabetes mellitus.  3. Arthritis.  4. History of chronic obstructive pulmonary disease.  5. Morbid obesity.  6. Hypertension.   PRINCIPAL PROCEDURE:  Incisional herniorrhaphy with mesh on December 19, 2005.      Dalia Heading, M.D.  Electronically Signed    MAJ/MEDQ  D:  12/20/2005  T:  12/20/2005  Job:  [id number]   cc:   Kingsley Callander. [name], MD  Fax: [phone]

## 2010-08-27 NOTE — Op Note (Signed)
NAMEANGELISE, PETRICH                  ACCOUNT NO.:  [id number]   MEDICAL RECORD NO.:  [id number]          PATIENT TYPE:  INP   LOCATION:  2899                         FACILITY:  [facility]   PHYSICIAN:  Harvie Junior, M.D.   DATE OF BIRTH:  May 17, 1950   DATE OF PROCEDURE:  03/24/2004  DATE OF DISCHARGE:                                 OPERATIVE REPORT   PREOPERATIVE DIAGNOSIS:  End-stage degenerative joint disease, left knee.   POSTOPERATIVE DIAGNOSIS:  End-stage degenerative joint disease, left knee.   PRINCIPAL PROCEDURES:  1.  Left total knee replacement with Sigma system, size 3 femur, size 3      tibia, 10 mm bridging bearing, 32 mm all-poly patella total knee      replacement.  2.  Computer assistance for total knee replacement.   SURGEON:  Harvie Junior, M.D.   ASSISTANT:  Marshia Ly, P.A.   ANESTHESIA:  General.   BRIEF HISTORY:  A 60 year old female with a long history of having left knee  pain.  She was ultimately scoped several years ago and had bone-on-bone  findings.  She was able to make it through some period of time post knee  arthroscopy.  Because of continued complaints of pain and conservative care,  she was ultimately taken to the operating room for left total knee  replacement.   PROCEDURE:  Preoperatively we did discuss with the patient, given her large  size and need for perfect alignment, that computer assistance would be  required during her case and she was understanding of this and desirous of  this, and she was brought to the operating room for this procedure.   The patient was brought to the operating room and after adequate level of  anesthesia was obtained with a general anesthetic, the patient was  positioned supine upon the operating table.  The left leg was prepped and  draped in a sterile fashion.  Following this, the leg was exsanguinated and  a blood pressure tourniquet was inflated to 350 mmHg.  Following this a  midline incision was made  and subcutaneous tissues taken down to the level  of the extensor mechanism.  A medial parapatellar arthrotomy was undertaken,  medial and lateral meniscectomy, anterior and posterior cruciate excision,  osteophyte excision, and patellar fat pad excision.  At this point the pins  for computer assistance were placed, one in the tibial wound, one outside,  and then proximally two in the wound.  At this point the computer mapping  was undertaken and out outline showed [place] to be in about 10 degrees of varus  with about a 25-degree flexion contracture.  At this point the medial  release was completed and attention was turned to the tibia.  It was cut  perpendicular to its long axis under computer assistance and following this,  attention was turned to the femur.  The femur was cut perpendicular to its  long axis to make the femoral gap essentially 25 mm, knowing that the  prosthesis and total componentry took up about 21 mm.  So at this point  the  attention was turned to the flexion gap.  Flexion gap was measured to be 25  mm with the planned resection.  It did appear as though there was a tiny bit  of notching with that planned computer resection, so at this point we did  move the block slightly anterior and the anterior and posterior cuts were  made at this point.  Attention was then turned toward the chamfer cuts.  These were made and attention was turned toward the box-cutting jig, which  was centered and boxed.  Following this the attention was turned to the  tibia, which was sized and the central peg was drilled as well as the keels  being impacted.  At this point the trial components were put in place and  put through a range of motion.  The computer assistance at that point showed  [place] to be in 0 degrees of varus and valgus alignment and still had about a 1  degree flexion contracture.  This was certainly felt to be satisfactory.  At  this point the attention was turned to the patella,  which was free-hand cut.  Because of her large size we could not evert the patella, so we free-hand  cut the patella at this point down to a level of 13 mm, and a 32 mm all-poly  patella trial was then trialled.  Excellent range of motion was achieved, no  tendency toward patellar subluxation.  At this point the tourniquet was let  down, given that we had because of the increased time with the computer  assistance critical to the case, we did let the tourniquet down at this  point.  We waited 15 minutes and after 15 minutes of the tourniquet being  down, we did reinflate the tourniquet for the cementing of the case.  Once  all components were cemented in place and allowed to harden, the knee was  put through a range of motion.  Excellent range of motion was achieved, no  tendency toward subluxation.  The excellent balance of the knee was achieved  at this point.  Following this, attention was turned toward the, a medium  Hemovac drain was placed.  The medial parapatellar arthrotomy was closed  after removing all excess cement.  The final trial put in place and the  final computer alignment was checked, which showed [place] to be in neutral  alignment as well as normal flexion and extension, full extension.  After  the cement was allowed to harden, the alignment was checked.  The medium  Hemovac drain was placed, the medial parapatellar arthrotomy was closed, and  the subcu was closed with 0 and 2-0 Vicryl.  The skin was closed with  staples.  A sterile compressive dressing was applied and the patient was  taken to the recovery room, where she was noted to be in satisfactory  condition.  Estimated blood loss for the procedure was 200 mL.  Total  tourniquet time was 2 hours initially with some question about after we  initially let the tourniquet down that it may have been up to 2 hours 15  minutes, there may have been a little bit of a faulty tourniquet with that, but about 2 hours 15 minutes for  the tourniquet initially, and then the  tourniquet was down for 20 minutes and then up for about 45 minutes.      Junita Push  D:  03/24/2004  T:  03/24/2004  Job:  [id number]

## 2010-08-27 NOTE — Op Note (Signed)
NAME:  [name], [name]                            ACCOUNT NO.:  [id number]   MEDICAL RECORD NO.:  [id number]                   PATIENT TYPE:  OIB   LOCATION:  2899                                 FACILITY:  [facility]   PHYSICIAN:  Harvie Junior, M.D.                DATE OF BIRTH:  1950/12/28   DATE OF PROCEDURE:  08/11/2003  DATE OF DISCHARGE:  08/11/2003                                 OPERATIVE REPORT   PREOPERATIVE DIAGNOSIS:  Medial meniscal tear.   POSTOPERATIVE DIAGNOSES:  1. Medial meniscal tear.  2. Severe grade 4 changes of  the medial femoral compartment.  3. Chondromalacia of the lateral femoral compartment.  4. Chondromalacia patellofemoral trochlea.   OPERATION PERFORMED:  1. Partial posterior horn midbody medial meniscectomy with corresponding     debridement of medial femoral compartment.  2. Debridement of mild chondromalacia, lateral femoral compartment.  3. Debridement of significant chondromalacia, patellofemoral trochlea.   SURGEON:  Harvie Junior, M.D.   ASSISTANT:  Marshia Ly, P.A.   ANESTHESIA:  General.   INDICATIONS FOR PROCEDURE:  The patient is a 60 year old female with a long  history of having significant bilateral degenerative joint disease.  She was  ultimately evaluated and found to have x-rays showing significant narrowing  on the medial side but had recently had increasing symptoms and catching and  locking of the knee.  We had talked about treatment options including knee  arthroscopy versus the possibility of knee replacement but ultimately felt  that given the recent onset and change in symptoms, that knee arthroscopy  was reasonable and she was brought to the operating room for this procedure.   DESCRIPTION OF PROCEDURE:  The patient was brought to the operating room and  after adequate anesthesia was obtained with general anesthetic, the patient  was placed on the operating table.  The left knee was then prepped and  draped in the usual  sterile fashion.  Following this, routine arthroscopic  examination of the knee revealed that there was significant grade 4 changes  in the medial compartment with significant area over probably 2.5 to 3  square centimeters on the femur and 2 square centimeters on the tibia of  exposed bone.  This was debrided back to a smooth and stable rim. There was  significant meniscal tear on the medial side from the midbody around  posteriorly and this was debrided.  Attention was then turned laterally with  the lateral compartment identified and debrided.  Attention was then turned  up into the patellofemoral joint where there some grade 3 changes in the  patellofemoral joint especially in the trochlea. This was debrided from both  the medial and lateral compartments back to a smooth and stable rim.  One  loose body was identified in the lateral compartment which  was removed.  At  this point the knee was copiously irrigated with normal saline  irrigation,  suctioned dry.  Final check was made for any loose or  fragmented pieces.  Seeing none, sterile compressive dressing was applied.  The patient was then transferred to the recovery room where she was noted to  be in satisfactory condition.  The estimated blood loss for this procedure  was none.                                               Harvie Junior, M.D.    Ranae Plumber  D:  08/11/2003  T:  08/12/2003  Job:  [id number]

## 2010-08-27 NOTE — H&P (Signed)
[name], [name]                  ACCOUNT NO.:  [id number]   MEDICAL RECORD NO.:  [id number]          PATIENT TYPE:  INP   LOCATION:  A319                          FACILITY:  [facility]   PHYSICIAN:  Dalia Heading, M.D.  DATE OF BIRTH:  Feb 17, 1951   DATE OF ADMISSION:  12/16/2005  DATE OF DISCHARGE:  LH                                HISTORY & PHYSICAL   CHIEF COMPLAINT:  Abdominal pain and distention.   HISTORY OF PRESENT ILLNESS:  Patient is a 60 year old white female with  multiple medical problems who presents with abdominal distention and nausea.  She was seen in the emergency room, and an initial KUB revealed a possible  bowel obstruction versus ileus.  She states her last bowel movement was  yesterday.  She did pass gas this morning.  She is nauseated but has not  thrown up recently.  She denies any fever or chills.   PAST MEDICAL HISTORY:  Includes rheumatoid arthritis, obesity, insulin-  dependent diabetes mellitus, hypertension.   PAST SURGICAL HISTORY:  Laparoscopic cholecystectomy, appendectomy,  incisional hernia repair, hysterectomy.   CURRENT MEDICATIONS:  1. Novolin 70/30.  2. Insulin 90 units q.a.m., 40 units q.p.m.  3. Glucophage 2000 mg p.o. q. day.  4. Micardis 20 mg p.o. q. day.  5. Zocor 20 mg p.o. q. day.  6. Methotrexate.  7. Folic acid.  8. Iron supplement.  9. Iron supplements.   ALLERGIES:  DEMEROL, SULFA, TETRACYCLINE.   REVIEW OF SYSTEMS:  Patient denies smoking or illicit drug use.   PHYSICAL EXAMINATION:  GENERAL:  Patient is a pleasant 60 year old white  female in no acute distress.  She is afebrile.  VITAL SIGNS:  Stable.  LUNGS:  Clear to auscultation with equal breath sounds bilaterally.  HEART:  Regular rate and rhythm, S3, S4 without murmurs.  ABDOMEN:  Distended but not tense.  Non-specific tenderness is noted.  No  rigidity is noted.  Multiple surgical scars are present.  RECTAL:  Reveals no rectal mass.  Stool appears to be  heme-negative.   CBC is within normal limits.  Met 7 is within normal limits.  Liver profile  is within normal limits.  Amylase and lipase are within normal limits.  Urinalysis is unremarkable.   IMPRESSION:  Abdominal pain, question bowel obstruction versus ileus.   PLAN:  The patient will be admitted to the hospital for glucose and pain  control.  A CT scan of the abdomen and pelvis has been ordered.  Further  management is pending those results.      Dalia Heading, M.D.  Electronically Signed     MAJ/MEDQ  D:  12/16/2005  T:  12/16/2005  Job:  [id number]   cc:   Chart   Kingsley Callander. [name], MD  Fax: [phone]

## 2010-08-27 NOTE — Op Note (Signed)
NAME:  [name], [name]                            ACCOUNT NO.:  [id number]   MEDICAL RECORD NO.:  [id number]                   PATIENT TYPE:  AMB   LOCATION:  DAY                                  FACILITY:  [facility]   PHYSICIAN:  Lionel December, M.D.                 DATE OF BIRTH:  1950-04-28   DATE OF PROCEDURE:  12/27/2002  DATE OF DISCHARGE:  12/27/2002                                 OPERATIVE REPORT   PROCEDURES:  Esophagogastroduodenoscopy with esophageal dilatation, followed  by a flexible sigmoidoscopy.   INDICATIONS:  [name] is a 60 year old Caucasian female with multiple medical  problems who also has GERD and is on antireflux measures and a PPI, who has  a several-month history of solid food dysphagia.  She also complains of a  rectal discharge and noted scant amount of blood per rectum.  He last  colonoscopy was in October 2002.  I therefore felt sigmoidoscopy would be  appropriate rather than repeating her colonoscopy.  The procedure risks were  reviewed with the patient and informed consent was obtained.   PREMEDICATION:  Cetacaine spray for pharyngeal topical anesthesia, Demerol  50 mg IV, Versed 10 mg IV in divided dose.   FINDINGS:  Procedures performed in endoscopy suite.  The patient's vital  signs and O2 saturation were monitored during the procedure and remained  stable.   Procedure #1, esophagogastroduodenoscopy:  The patient was placed in the  left lateral recumbent position and the Olympus video scope was passed via  oropharynx without any difficulty into esophagus.   Esophagus:  Mucosa of the esophagus normal.  There was no ring or stricture  formation.  There was a 3-4 cm sliding hiatal hernia.   Stomach:  It was empty and distended very well with insufflation.  Examination of the mucosa revealed few antral erosions, but there was no  ulcer crater.  Pyloric channel was patent.  Angularis, fundus, and cardia  were examined by retroflexing the scope and were  normal.   Duodenum:  Examination of the bulb and second part of the duodenum was  normal.  The endoscope was withdrawn.   The esophagus was dilated by passing 56 [place] Maloney dilator through the  esophagus completely.  As the dilator was withdrawn the endoscope was passed  again, and there was no mucosal injury to the esophagus.  The endoscope was  withdrawn and the patient prepared for procedure #2.   Procedure #2, flexible sigmoidoscopy:  A rectal examination performed.  No  abnormality noted on external or digital exam.  The scope was placed in the  rectum and advanced under vision in the sigmoid colon and beyond.  Preparation was satisfactory.  The scope was passed to 60 cm, where she had  formed stool.  Scattered diverticula were noted to the descending and  sigmoid colon, but the mucosal pattern was normal.  Rectal mucosa similarly  was normal.  The scope was retroflexed to examine the anorectal junction,  and she had small hemorrhoids below the dentate line.  The endoscope was  straightened and withdrawn.  The patient tolerated the procedure well.    FINAL DIAGNOSES:  1. Small sliding hiatal hernia and erosive antral gastritis.  2. No evidence of esophageal stricture or ring formation.  Esophagus dilated     given history of dysphagia.  3. Left colonic diverticulosis and small external hemorrhoids.  4. I suspect she also has irritable bowel syndrome.   RECOMMENDATIONS:  1. She will continue antireflux measures and Prilosec as before.  If she     remains with dysphagia, she will give [place] a call.  2. Citrucel one tablespoonful daily.  3. Levsin SL t.i.d. p.r.n.                                                 Lionel December, M.D.    NR/MEDQ  D:  12/28/2002  T:  12/30/2002  Job:  [id number]   cc:   Kingsley Callander. [name], M.D.  [address]  [place]  [place] [address]  Fax: [phone]

## 2010-08-27 NOTE — Op Note (Signed)
[name], [name]                  ACCOUNT NO.:  [id number]   MEDICAL RECORD NO.:  [id number]          PATIENT TYPE:  INP   LOCATION:  A319                          FACILITY:  [facility]   PHYSICIAN:  Dalia Heading, M.D.  DATE OF BIRTH:  [birth year]   DATE OF PROCEDURE:  12/19/2005  DATE OF DISCHARGE:                                 OPERATIVE REPORT   PREOPERATIVE DIAGNOSIS:  Incisional hernia.   POSTOPERATIVE DIAGNOSIS:  Incisional hernia.   PROCEDURE:  Incisional herniorrhaphy with mesh.   SURGEON:  Dr. Franky Macho.   ANESTHESIA:  General endotracheal.   INDICATIONS:  The patient is a 60 year old white female who presents with a  recurrent incisional hernia along the right side of her abdomen.  She  initially presented with bowel obstructive symptoms, though these resolved  when the hernia was reduced.  The risks and benefits of the procedure  including bleeding, infection, cardiopulmonary difficulties, and the  possibility of recurrence of the hernia were fully explained to the patient,  gave informed consent.   PROCEDURE NOTE:  The patient was placed in the supine position.  After  induction of general endotracheal anesthesia, the abdomen was prepped and  draped using the usual sterile technique with Betadine.  Surgical site  confirmation was performed.   A right paramedian incision was made around the umbilical level down to the  subcutaneous layer.  The hernia defect was found.  It was noted to be  approximately 5 x 7 cm its greatest diameter.  The hernia sac was excised.  This was taken down to the edge of the fascia.  There was evidence of old  polypropylene mesh present.  Part of it seemed to have had torn and other  parts the fascia just disintegrated.  Initially, a primary repair was  performed using the old mesh that was in place.  When the patient Valsalva,  this repair fell apart and it was elected to proceed with additional  polypropylene mesh patch.  This was  secured circumferentially to the  previous mesh and the fascia using 0 Prolene running sutures and 0 Prolene  interrupted sutures.  A tension-free repair was performed.  The subcutaneous  layer was reapproximated using a 2-0 Vicryl interrupted suture.  The skin  was closed using staples.  Betadine ointment and dry sterile dressings were  applied.   All tape and needle counts correct at the end of the procedure.  The patient  was extubated in the operating room and went back to the recovery room awake  in stable condition.   COMPLICATIONS:  None.   SPECIMEN:  None.   BLOOD LOSS:  Minimal.      Dalia Heading, M.D.  Electronically Signed     MAJ/MEDQ  D:  12/19/2005  T:  12/20/2005  Job:  [id number]   cc:   Kingsley Callander. [name], MD  Fax: [phone]

## 2010-09-01 ENCOUNTER — Encounter (INDEPENDENT_AMBULATORY_CARE_PROVIDER_SITE_OTHER): Payer: Self-pay | Admitting: Surgery

## 2010-09-08 ENCOUNTER — Ambulatory Visit
Admission: RE | Admit: 2010-09-08 | Discharge: 2010-09-08 | Disposition: A | Discharge status: Home / Self Care | Source: Ambulatory Visit | Attending: Internal Medicine | Admitting: Internal Medicine

## 2010-09-08 ENCOUNTER — Other Ambulatory Visit: Payer: Self-pay | Admitting: Internal Medicine

## 2010-09-08 DIAGNOSIS — Z1231 Encounter for screening mammogram for malignant neoplasm of breast: Secondary | ICD-10-CM

## 2010-09-08 DIAGNOSIS — N6310 Unspecified lump in the right breast, unspecified quadrant: Secondary | ICD-10-CM

## 2010-09-14 ENCOUNTER — Ambulatory Visit
Admission: RE | Admit: 2010-09-14 | Discharge: 2010-09-14 | Disposition: A | Discharge status: Home / Self Care | Source: Ambulatory Visit | Attending: Internal Medicine | Admitting: Internal Medicine

## 2010-09-14 ENCOUNTER — Other Ambulatory Visit: Payer: Self-pay | Admitting: Internal Medicine

## 2010-09-14 DIAGNOSIS — N6310 Unspecified lump in the right breast, unspecified quadrant: Secondary | ICD-10-CM

## 2010-09-17 ENCOUNTER — Other Ambulatory Visit: Payer: Self-pay | Admitting: Diagnostic Radiology

## 2010-09-17 ENCOUNTER — Ambulatory Visit
Admission: RE | Admit: 2010-09-17 | Discharge: 2010-09-17 | Disposition: A | Discharge status: Home / Self Care | Source: Ambulatory Visit | Attending: Internal Medicine | Admitting: Internal Medicine

## 2010-09-20 ENCOUNTER — Encounter: Payer: Self-pay | Admitting: Internal Medicine

## 2010-09-20 ENCOUNTER — Ambulatory Visit (INDEPENDENT_AMBULATORY_CARE_PROVIDER_SITE_OTHER): Admitting: Internal Medicine

## 2010-09-20 VITALS — BP 143/77 | HR 87 | Temp 98.1°F | Ht 63.0 in | Wt 278.5 lb

## 2010-09-20 DIAGNOSIS — T8459XA Infection and inflammatory reaction due to other internal joint prosthesis, initial encounter: Secondary | ICD-10-CM

## 2010-09-20 DIAGNOSIS — Z96659 Presence of unspecified artificial knee joint: Secondary | ICD-10-CM

## 2010-09-20 DIAGNOSIS — T8450XA Infection and inflammatory reaction due to unspecified internal joint prosthesis, initial encounter: Secondary | ICD-10-CM

## 2010-09-20 NOTE — Assessment & Plan Note (Addendum)
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 I repeated her sedimentation rate today and it was down to 28 and her C. reactive protein was down to 0.8. I will have her stop rifampin and observe her off of all antibiotics at this time.

## 2010-09-20 NOTE — Progress Notes (Signed)
  Subjective:    Patient ID: [name], female    DOB: [birth year], 60 y.o.   MRN: [id number]  HPI [name] is in for her routine visit. 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 She is doing better but still has some aching pain and numbness in her knee. She feels like she has made progress. She did restart Enbrel after seeing Dr. Remi Deter, her rheumatologist. She did not restart methotrexate. After her last visit with me she stopped her vancomycin and started dicloxacillin along with rifampin. Unfortunately the dicloxacillin was mistakenly ordered as only a two-week prescription with no refills. She took it without any difficulty but then stopped it and has only been on rifampin for the last 2 months.    Review of Systems     Objective:   Physical Exam  Constitutional: No distress.  Musculoskeletal:       Her right knee incision is fully healed. Her knee is not warm or painful.          Assessment & Plan:

## 2010-09-22 NOTE — Progress Notes (Signed)
Addended by: Cliffton Asters on: 09/22/2010 05:50 PM   Modules accepted: Orders

## 2010-10-18 ENCOUNTER — Ambulatory Visit (INDEPENDENT_AMBULATORY_CARE_PROVIDER_SITE_OTHER): Admitting: Pulmonary Disease

## 2010-10-18 ENCOUNTER — Encounter: Payer: Self-pay | Admitting: Pulmonary Disease

## 2010-10-18 DIAGNOSIS — J438 Other emphysema: Secondary | ICD-10-CM

## 2010-10-18 DIAGNOSIS — G4733 Obstructive sleep apnea (adult) (pediatric): Secondary | ICD-10-CM

## 2010-10-18 NOTE — Progress Notes (Signed)
  Subjective:    Patient ID: [name], female    DOB: June 11, 1950, 60 y.o.   MRN: [id number]  HPI The pt comes in today for f/u of her known osa and copd.  She is wearing cpap compliantly without issues with mask or pressure.  She is sleeping well, with adequate daytime alertness.  Her breathing is near her usual baseline, with intermittant congestion but no worsening exertional tolerance.  She has not had an acute exacerbation since the last visit.    Review of Systems  Constitutional: Negative for fever and unexpected weight change.  HENT: Negative for ear pain, nosebleeds, congestion, sore throat, rhinorrhea, sneezing, trouble swallowing, dental problem, postnasal drip and sinus pressure.   Eyes: Negative for redness and itching.  Respiratory: Negative for cough, chest tightness, shortness of breath and wheezing.   Cardiovascular: Positive for leg swelling. Negative for palpitations.  Gastrointestinal: Negative for nausea and vomiting.  Genitourinary: Negative for dysuria.  Musculoskeletal: Negative for joint swelling.  Skin: Positive for rash.  Neurological: Negative for headaches.  Hematological: Bruises/bleeds easily.  Psychiatric/Behavioral: Negative for dysphoric mood. The patient is not nervous/anxious.        Objective:   Physical Exam Morbidly obese female in nad No skin breakdown or pressure necrosis from cpap mask Chest with clear bs, no wheezing Cor with rrr LE with 1+ edema, no cyanosis Alert, oriented, moves all 4.  Does not appear sleepy.        Assessment & Plan:

## 2010-10-18 NOTE — Patient Instructions (Addendum)
No change in meds Stay on cpap, and continue with weight loss.  You are doing well. followup with me in 6mos.

## 2010-10-18 NOTE — Assessment & Plan Note (Signed)
The pt is doing well on her current bronchodilator regimen.  No recent flares or pulmonary infections.  She is to continue on her same meds.

## 2010-10-18 NOTE — Assessment & Plan Note (Signed)
The pt is doing well with cpap, and continues to slowly work on weight reduction.

## 2010-11-01 ENCOUNTER — Encounter (INDEPENDENT_AMBULATORY_CARE_PROVIDER_SITE_OTHER): Payer: Self-pay | Admitting: Surgery

## 2010-11-01 ENCOUNTER — Ambulatory Visit (INDEPENDENT_AMBULATORY_CARE_PROVIDER_SITE_OTHER): Admitting: Surgery

## 2010-11-01 VITALS — BP 114/64 | HR 64 | Temp 96.4°F | Ht 63.0 in | Wt 276.4 lb

## 2010-11-01 DIAGNOSIS — IMO0002 Reserved for concepts with insufficient information to code with codable children: Secondary | ICD-10-CM

## 2010-11-01 DIAGNOSIS — K432 Incisional hernia without obstruction or gangrene: Secondary | ICD-10-CM

## 2010-11-01 NOTE — Progress Notes (Signed)
Subjective:     Patient ID: [name], female   DOB: [birth year], 60 y.o.   MRN: [id number]  HPI  The patient returns for wound check. She notes the open wound has stayed closed. She uses cream on it. It does remain thin and dry at times.  The infraumbilical wound has gotten much more shallow to where she can barely pack it with tweezers. Her appetite and energy level are good.. She denies any nausea vomiting or pain.  Review of Systems  Constitutional: Negative for fever, chills, diaphoresis, appetite change and fatigue.  HENT: Negative for ear pain, sore throat, trouble swallowing, neck pain and ear discharge.   Eyes: Negative for photophobia, discharge and visual disturbance.  Respiratory: Negative for cough, choking, chest tightness and shortness of breath.   Cardiovascular: Negative for chest pain and palpitations.  Gastrointestinal: Negative for nausea, vomiting, abdominal pain, diarrhea, constipation, anal bleeding and rectal pain.  Genitourinary: Negative for dysuria, frequency and difficulty urinating.  Musculoskeletal: Negative for myalgias and gait problem.  Skin: Negative for color change, pallor and rash.  Neurological: Negative for dizziness, speech difficulty, weakness and numbness.  Hematological: Negative for adenopathy.  Psychiatric/Behavioral: Negative for confusion and agitation. The patient is not nervous/anxious.        Objective:   Physical Exam  Constitutional: She is oriented to person, place, and time. She appears well-developed and well-nourished. No distress.  HENT:  Head: Normocephalic.  Mouth/Throat: Oropharynx is clear and moist. No oropharyngeal exudate.  Eyes: Conjunctivae and EOM are normal. Pupils are equal, round, and reactive to light. No scleral icterus.  Neck: Normal range of motion. Neck supple. No tracheal deviation present.  Cardiovascular: Normal rate, regular rhythm and intact distal pulses.   Pulmonary/Chest: Effort normal and breath  sounds normal. No respiratory distress. She exhibits no tenderness.  Abdominal: Soft. She exhibits no distension and no mass. There is no tenderness. Hernia confirmed negative in the right inguinal area and confirmed negative in the left inguinal area.  Genitourinary: No vaginal discharge found.  Musculoskeletal: Normal range of motion. She exhibits no tenderness.  Lymphadenopathy:    She has no cervical adenopathy.       Right: No inguinal adenopathy present.       Left: No inguinal adenopathy present.  Neurological: She is alert and oriented to person, place, and time. No cranial nerve deficit. She exhibits normal muscle tone. Coordination normal.  Skin: Skin is warm and dry. No rash noted. She is not diaphoretic. No erythema.  Psychiatric: She has a normal mood and affect. Her behavior is normal. Judgment and thought content normal.       Assessment:     Status post debridement to remove the mesh and stitch abscess in May and November 2011. Wounds are nearly closed.   Plan:     Increase the lotion yo something thicker him to keep the skin from breaking down in the right mid abdomen.  Continue packing infraumbilical wound until it is fully closed.  Return to clinic 2 months.  She and I arer very hopeful that the worst is behind her. Hopefully,  she'll continue to improve and heal. If she wishes to have a more definitive hernia repair and closure, I think she should go to a major center such as [facility] or [place]. However I like her to be infection free and closed for several months before even considering that. She does not feel too strongly about having surgery to fix this given her rough course  other prior surgeries.

## 2010-11-01 NOTE — Patient Instructions (Signed)
Continue the excellent wound care!

## 2010-11-01 NOTE — Progress Notes (Deleted)
[name] is a 60 y.o. female.    No chief complaint on file.   HPI HPI ***  Past Medical History  Diagnosis Date  . Diabetes mellitus   . Hyperlipidemia   . GERD (gastroesophageal reflux disease)   . Asthma   . Hypertension   . Morbid obesity   . Obstructive sleep apnea syndrome   . Septic arthritis of knee, right 04/2010  . 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 Rheumatoid arthritis     on methotrexate therapy  . Depression     Past Surgical History  Procedure Date  . Hernia repair     abdominal hernia  . Cholecystectomy     laparoscopic  . Appendectomy   . Total knee arthroplasty 05/07/2010    right knee  . Revision total knee arthroplasty 06/09/2010    I and D   . Abdominal hysterectomy     Family History  Problem Relation Age of Onset  . Cancer Mother   . Hyperlipidemia Mother   . Diabetes Mother   . Diabetes Father   . COPD Father   . Hyperlipidemia Brother   . Hypertension Brother     Social History History  Substance Use Topics  . Smoking status: Former Smoker -- 2.0 packs/day for 25 years    Types: Cigarettes    Quit date: 04/11/1992  . Smokeless tobacco: Never Used  . Alcohol Use: No    Allergies  Allergen Reactions  . Hydromorphone Hcl   . Meperidine Hcl   . Tetracycline     Current Outpatient Prescriptions  Medication Sig Dispense Refill  . albuterol (PROVENTIL HFA) 108 (90 BASE) MCG/ACT inhaler Inhale 2 puffs into the lungs every 6 (six) hours as needed.        . Calcium Carbonate-Vitamin D (CALCIUM 600+D) 600-400 MG-UNIT per tablet Take 1 tablet by mouth 2 (two) times daily.        . Cholecalciferol (VITAMIN D3) 1000 UNITS CAPS Take 1 capsule by mouth every evening.        . diclofenac-misoprostol (ARTHROTEC 75) 75-0.2 MG per tablet Take 1 tablet by mouth 2 (two) times daily.        Marland Kitchen etanercept (ENBREL) 50 MG/ML injection Inject 50 mg into the skin once a week. On Mondays       . exenatide (BYETTA 10 MCG PEN)  10 MCG/0.04ML SOLN Inject into the skin 2 (two) times daily.        . ferrous sulfate 325 (65 FE) MG tablet Take 325 mg by mouth daily with breakfast.        . fexofenadine (ALLEGRA) 180 MG tablet Take 180 mg by mouth daily.        . fish oil-omega-3 fatty acids 1000 MG capsule Take 2 g by mouth every morning.       . Fluticasone-Salmeterol (ADVAIR DISKUS) 250-50 MCG/DOSE AEPB Inhale 2 puffs into the lungs 2 (two) times daily.  120 each  11  . furosemide (LASIX) 40 MG tablet Take 40 mg by mouth as needed.        Marland Kitchen ibuprofen (ADVIL,MOTRIN) 200 MG tablet Take 400-600 mg by mouth every 8 (eight) hours as needed.        . insulin aspart (NOVOLOG) 100 UNIT/ML injection Inject 6 Units subcutaneously every morning, 10 Units subcutaneously at lunchtime and 6 Units subcutaneously at bedtime as directed      . insulin glargine (LANTUS) 100 UNIT/ML injection Inject 90 Units  into the skin 2 (two) times daily.        . metFORMIN (GLUCOPHAGE-XR) 500 MG 24 hr tablet Take 2,000 mg by mouth daily.        . methocarbamol (ROBAXIN-750) 750 MG tablet Take 1 tablet (750 mg total) by mouth every 8 (eight) hours as needed.  30 tablet  0  . omeprazole (PRILOSEC) 40 MG capsule TAKE 1 CAPSULE EACH MORNING  90 capsule  3  . sertraline (ZOLOFT) 100 MG tablet Take 100 mg by mouth daily.        . simvastatin (ZOCOR) 40 MG tablet Take 40 mg by mouth at bedtime.        Marland Kitchen telmisartan (MICARDIS) 40 MG tablet Take 40 mg by mouth daily.        Marland Kitchen tetrahydrozoline 0.05 % ophthalmic solution Place 2 drops into both eyes 3 (three) times daily. For dry eyes       . tiotropium (SPIRIVA) 18 MCG inhalation capsule Place 18 mcg into inhaler and inhale daily.          Review of Systems ROS  Physical Exam Physical Exam   There were no vitals taken for this visit.  Assessment/Plan ***  [name] C. 11/01/2010, 11:56 AM

## 2010-11-09 ENCOUNTER — Other Ambulatory Visit: Payer: Self-pay | Admitting: Pulmonary Disease

## 2010-11-16 ENCOUNTER — Ambulatory Visit (INDEPENDENT_AMBULATORY_CARE_PROVIDER_SITE_OTHER): Admitting: Internal Medicine

## 2010-11-16 ENCOUNTER — Encounter: Payer: Self-pay | Admitting: Internal Medicine

## 2010-11-16 DIAGNOSIS — Z96659 Presence of unspecified artificial knee joint: Secondary | ICD-10-CM

## 2010-11-16 DIAGNOSIS — T8450XA Infection and inflammatory reaction due to unspecified internal joint prosthesis, initial encounter: Secondary | ICD-10-CM

## 2010-11-16 DIAGNOSIS — T8459XA Infection and inflammatory reaction due to other internal joint prosthesis, initial encounter: Secondary | ICD-10-CM

## 2010-11-16 NOTE — Assessment & Plan Note (Signed)
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 I will continue observation off of antibiotics.

## 2010-11-16 NOTE — Progress Notes (Signed)
  Subjective:    Patient ID: [name], female    DOB: [birth year], 60 y.o.   MRN: [id number]  HPI Ms. Earwood is in for her routine followup visit. 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 Her sedimentation rate had been 28 and her C-reactive protein down to 0.8 2 months ago. She feels like she is doing much better and has not had any evidence of recurrent infection.    Review of Systems     Objective:   Physical Exam  Musculoskeletal:       Her right knee incision is fully healed. Her right knee is not any warmer than her left knee and there is no evidence of cellulitis or effusion.          Assessment & Plan:

## 2010-12-27 ENCOUNTER — Ambulatory Visit (INDEPENDENT_AMBULATORY_CARE_PROVIDER_SITE_OTHER): Admitting: Surgery

## 2010-12-27 ENCOUNTER — Encounter (INDEPENDENT_AMBULATORY_CARE_PROVIDER_SITE_OTHER): Payer: Self-pay | Admitting: Surgery

## 2010-12-27 VITALS — BP 142/72 | HR 78 | Ht 62.0 in | Wt 283.4 lb

## 2010-12-27 DIAGNOSIS — IMO0002 Reserved for concepts with insufficient information to code with codable children: Secondary | ICD-10-CM

## 2010-12-27 NOTE — Patient Instructions (Signed)
Continue wound care. ?

## 2010-12-27 NOTE — Progress Notes (Signed)
Subjective:     Patient ID: [name], female   DOB: 1951/03/28, 60 y.o.   MRN: [id number]  HPI   The patient returns for wound check. She notes the RLQ old wound has stayed closed. She uses Eucerin cream daily on it. It does remain thin and dry at times.  The infraumbilical wound has gotten much more shallow. Her appetite and energy level are good.. She denies any nausea vomiting or pain.  Review of Systems  Constitutional: Negative for fever, chills, diaphoresis, appetite change and fatigue.  HENT: Negative for ear pain, sore throat, trouble swallowing, neck pain and ear discharge.   Eyes: Negative for photophobia, discharge and visual disturbance.  Respiratory: Negative for cough, choking, chest tightness and shortness of breath.   Cardiovascular: Negative for chest pain and palpitations.  Gastrointestinal: Negative for nausea, vomiting, abdominal pain, diarrhea, constipation, anal bleeding and rectal pain.  Genitourinary: Negative for dysuria, frequency and difficulty urinating.  Musculoskeletal: Negative for myalgias and gait problem.  Skin: Negative for color change, pallor and rash.  Neurological: Negative for dizziness, speech difficulty, weakness and numbness.  Hematological: Negative for adenopathy.  Psychiatric/Behavioral: Negative for confusion and agitation. The patient is not nervous/anxious.        Objective:   Physical Exam  Constitutional: She is oriented to person, place, and time. She appears well-developed and well-nourished. No distress.  HENT:  Head: Normocephalic.  Mouth/Throat: Oropharynx is clear and moist. No oropharyngeal exudate.  Eyes: Conjunctivae and EOM are normal. Pupils are equal, round, and reactive to light. No scleral icterus.  Neck: Normal range of motion. Neck supple. No tracheal deviation present.  Cardiovascular: Normal rate, regular rhythm and intact distal pulses.   Pulmonary/Chest: Effort normal and breath sounds normal. No respiratory  distress. She exhibits no tenderness.  Abdominal: Soft. She exhibits no distension and no mass. There is no tenderness. There is no rebound and no guarding. Hernia confirmed negative in the right inguinal area and confirmed negative in the left inguinal area.       infraumb wounds x2.  Upper 1cm deep (smaller), lower closed.  Repacked w NU-gauze.  Genitourinary: No vaginal discharge found.  Musculoskeletal: Normal range of motion. She exhibits no tenderness.  Lymphadenopathy:    She has no cervical adenopathy.       Right: No inguinal adenopathy present.       Left: No inguinal adenopathy present.  Neurological: She is alert and oriented to person, place, and time. No cranial nerve deficit. She exhibits normal muscle tone. Coordination normal.  Skin: Skin is warm and dry. No rash noted. She is not diaphoretic. No erythema.  Psychiatric: She has a normal mood and affect. Her behavior is normal. Judgment and thought content normal.       Assessment:     Status post debridement to remove the mesh and stitch abscess in May and November 2011. Wounds are nearly closed.   Plan:     Increase the Eucerin to BID to prevent the skin from breaking down in the right mid abdomen.  Continue packing infraumbilical wound until it is fully closed.  Return to clinic 2 months.  She and I are hopeful that the worst is behind her and that she will continue to improve and heal. If she wishes to have a more definitive hernia repair and closure, I think she should go to a major center such as [facility] or [place]. However I like her to be infection free and closed for several months  before even considering that. She does not feel too strongly about having surgery to fix this given her rough course other prior surgeries.

## 2011-01-06 LAB — URINALYSIS, ROUTINE W REFLEX MICROSCOPIC
Bilirubin Urine: NEGATIVE
Glucose, UA: NEGATIVE
Specific Gravity, Urine: >1.03 — ABNORMAL HIGH
pH: 5.5

## 2011-01-06 LAB — CBC
MCHC: 32.7
MCV: 83.9
Platelets: 280
RDW: 15.9 — ABNORMAL HIGH

## 2011-01-06 LAB — DIFFERENTIAL
Basophils Relative: 0
Eosinophils Absolute: 0.1
Monocytes Relative: 9
Neutrophils Relative %: 79 — ABNORMAL HIGH

## 2011-01-06 LAB — WOUND CULTURE: Gram Stain: NONE SEEN

## 2011-01-06 LAB — BASIC METABOLIC PANEL
BUN: 14
CO2: 28
Chloride: 100
Creatinine, Ser: 0.77
Glucose, Bld: 151 — ABNORMAL HIGH

## 2011-01-11 LAB — URINE CULTURE
Colony Count: NO GROWTH
Culture: NO GROWTH
Special Requests: NEGATIVE

## 2011-01-11 LAB — GLUCOSE, CAPILLARY
Glucose-Capillary: 161 — ABNORMAL HIGH
Glucose-Capillary: 183 — ABNORMAL HIGH
Glucose-Capillary: 202 — ABNORMAL HIGH
Glucose-Capillary: 202 — ABNORMAL HIGH
Glucose-Capillary: 226 — ABNORMAL HIGH
Glucose-Capillary: 262 — ABNORMAL HIGH
Glucose-Capillary: 291 — ABNORMAL HIGH
Glucose-Capillary: 321 — ABNORMAL HIGH
Glucose-Capillary: 43 — ABNORMAL LOW
Glucose-Capillary: 73
Glucose-Capillary: 87

## 2011-01-11 LAB — CBC
HCT: 37.1
MCHC: 32.9
MCV: 78.7
Platelets: 235
Platelets: 263
WBC: 5.6
WBC: 6.6

## 2011-01-11 LAB — HEPATIC FUNCTION PANEL
ALT: 22
Albumin: 3.2 — ABNORMAL LOW
Alkaline Phosphatase: 84
Total Protein: 6.9

## 2011-01-11 LAB — BASIC METABOLIC PANEL
BUN: 25 — ABNORMAL HIGH
CO2: 31
Chloride: 104
Creatinine, Ser: 0.73
GFR calc Af Amer: >60
Glucose, Bld: 203 — ABNORMAL HIGH
Sodium: 138

## 2011-01-11 LAB — CULTURE, BLOOD (ROUTINE X 2)
Culture: NO GROWTH
Culture: NO GROWTH

## 2011-01-11 LAB — CARDIAC PANEL(CRET KIN+CKTOT+MB+TROPI)
CK, MB: 1.3
Relative Index: INVALID
Troponin I: <0.01

## 2011-01-11 LAB — EXPECTORATED SPUTUM ASSESSMENT W GRAM STAIN, RFLX TO RESP C

## 2011-01-11 LAB — DIFFERENTIAL
Lymphocytes Relative: 7 — ABNORMAL LOW
Lymphs Abs: 0.4 — ABNORMAL LOW
Neutrophils Relative %: 85 — ABNORMAL HIGH

## 2011-01-11 LAB — CULTURE, RESPIRATORY W GRAM STAIN: Culture: NORMAL

## 2011-01-31 ENCOUNTER — Other Ambulatory Visit: Payer: Self-pay | Admitting: Pulmonary Disease

## 2011-02-03 ENCOUNTER — Other Ambulatory Visit: Payer: Self-pay | Admitting: *Deleted

## 2011-02-03 MED ORDER — FLUTICASONE-SALMETEROL 115-21 MCG/ACT IN AERO: 2.0000 | INHALATION_SPRAY | Freq: Two times a day (BID) | RESPIRATORY_TRACT | Status: DC

## 2011-03-02 ENCOUNTER — Encounter (INDEPENDENT_AMBULATORY_CARE_PROVIDER_SITE_OTHER): Payer: Self-pay | Admitting: Surgery

## 2011-03-02 ENCOUNTER — Ambulatory Visit (INDEPENDENT_AMBULATORY_CARE_PROVIDER_SITE_OTHER): Admitting: Surgery

## 2011-03-02 VITALS — BP 118/78 | HR 80 | Temp 96.9°F | Resp 18 | Ht 63.0 in | Wt 284.2 lb

## 2011-03-02 DIAGNOSIS — K432 Incisional hernia without obstruction or gangrene: Secondary | ICD-10-CM

## 2011-03-02 DIAGNOSIS — IMO0002 Reserved for concepts with insufficient information to code with codable children: Secondary | ICD-10-CM

## 2011-03-02 NOTE — Progress Notes (Signed)
Subjective:     Patient ID: [name], female   DOB: 09/06/1950, 60 y.o.   MRN: [id number]  HPI  Patient Care Team: Carylon Perches as PCP - General (Internal Medicine) Barbaraann Share, MD as Consulting Physician (Pulmonary Disease) Wendall Stade, MD as Consulting Physician (Cardiology)  This patient is a 61 y.o.female who presents today for surgical evaluation.   Reason for visit: Followup on chronic wound of abdomen.  The patient comes today feeling in good spirits. She notes the wound as were superficial. She can no longer really packet. Eating well. Regular bowel movements. No fevers chills or sweats.  Past Medical History  Diagnosis Date  . Diabetes mellitus   . Hyperlipidemia   . GERD (gastroesophageal reflux disease)   . Asthma   . Hypertension   . Morbid obesity   . Obstructive sleep apnea syndrome   . Septic arthritis of knee, right 04/2010  . 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 Rheumatoid arthritis     on methotrexate therapy  . Depression     Past Surgical History  Procedure Date  . Total knee arthroplasty 05/07/2010    right knee  . Revision total knee arthroplasty 06/09/2010    I and D   . Appendectomy 1967  . Abdominal hysterectomy 1992  . Cholecystectomy 1992    laparoscopic  . Aorto-pulmonary window repair 1992    for cardiac tamponade  . Hernia repair 2001    open LOA / VWH repair w mesh.  Dr. Lovell Sheehan  . Hernia repair 2007    open redo LOA / VWH repair w mesh.  Dr. Lovell Sheehan  . Abdominal wall mesh  removal 08/13/2009    with removal of retained stitches  . Foreign body removal abdominal 01/04/2010    stitch abscesses    History   Social History  . Marital Status: Widowed    Spouse Name: N/A    Number of Children: N/A  . Years of Education: N/A   Occupational History  . Not on file.   Social History Main Topics  . Smoking status: Former Smoker -- 2.0 packs/day for 25 years    Types: Cigarettes    Quit date: 04/11/1992  .  Smokeless tobacco: Never Used  . Alcohol Use: No  . Drug Use: No  . Sexually Active: Yes -- Female partner(s)   Other Topics Concern  . Not on file   Social History Narrative  . No narrative on file    Family History  Problem Relation Age of Onset  . Cancer Mother   . Hyperlipidemia Mother   . Diabetes Mother   . Diabetes Father   . COPD Father   . Hyperlipidemia Brother   . Hypertension Brother     Current outpatient prescriptions:albuterol (PROVENTIL HFA) 108 (90 BASE) MCG/ACT inhaler, Inhale 2 puffs into the lungs every 6 (six) hours as needed.  , Disp: , Rfl: ;  Calcium Carbonate-Vitamin D (CALCIUM 600+D) 600-400 MG-UNIT per tablet, Take 1 tablet by mouth 2 (two) times daily.  , Disp: , Rfl: ;  Cholecalciferol (VITAMIN D3) 1000 UNITS CAPS, Take 1 capsule by mouth every evening.  , Disp: , Rfl:  diclofenac-misoprostol (ARTHROTEC 75) 75-0.2 MG per tablet, Take 1 tablet by mouth 2 (two) times daily.  , Disp: , Rfl: ;  etanercept (ENBREL) 50 MG/ML injection, Inject 50 mg into the skin once a week. On Mondays , Disp: , Rfl: ;  exenatide (BYETTA 10 MCG  PEN) 10 MCG/0.04ML SOLN, Inject into the skin 2 (two) times daily.  , Disp: , Rfl: ;  ferrous sulfate 325 (65 FE) MG tablet, Take 325 mg by mouth daily with breakfast.  , Disp: , Rfl:  fexofenadine (ALLEGRA) 180 MG tablet, Take 180 mg by mouth daily.  , Disp: , Rfl: ;  fish oil-omega-3 fatty acids 1000 MG capsule, Take 2 g by mouth every morning. , Disp: , Rfl: ;  fluticasone-salmeterol (ADVAIR HFA) 115-21 MCG/ACT inhaler, Inhale 2 puffs into the lungs 2 (two) times daily., Disp: 3 Inhaler, Rfl: 3;  furosemide (LASIX) 40 MG tablet, Take 40 mg by mouth as needed.  , Disp: , Rfl:  ibuprofen (ADVIL,MOTRIN) 200 MG tablet, Take 400-600 mg by mouth every 8 (eight) hours as needed.  , Disp: , Rfl: ;  insulin aspart (NOVOLOG) 100 UNIT/ML injection, Inject 6 Units subcutaneously every morning, 10 Units subcutaneously at lunchtime and 6 Units  subcutaneously at bedtime as directed, Disp: , Rfl:  insulin glargine (LANTUS) 100 UNIT/ML injection, Inject 90 Units into the skin 2 (two) times daily. Per patient 80 in am and 80 in pm., Disp: , Rfl: ;  metFORMIN (GLUCOPHAGE-XR) 500 MG 24 hr tablet, Take 2,000 mg by mouth daily.  , Disp: , Rfl: ;  methocarbamol (ROBAXIN-750) 750 MG tablet, Take 1 tablet (750 mg total) by mouth every 8 (eight) hours as needed., Disp: 30 tablet, Rfl: 0 omeprazole (PRILOSEC) 40 MG capsule, TAKE 1 CAPSULE EACH MORNING, Disp: 90 capsule, Rfl: 3;  sertraline (ZOLOFT) 100 MG tablet, Take 100 mg by mouth daily.  , Disp: , Rfl: ;  simvastatin (ZOCOR) 40 MG tablet, Take 40 mg by mouth at bedtime.  , Disp: , Rfl: ;  telmisartan (MICARDIS) 40 MG tablet, Take 40 mg by mouth daily.  , Disp: , Rfl:  tetrahydrozoline 0.05 % ophthalmic solution, Place 2 drops into both eyes as needed. For dry eyes, Disp: , Rfl: ;  tiotropium (SPIRIVA) 18 MCG inhalation capsule, Place 18 mcg into inhaler and inhale daily.  , Disp: , Rfl:   Allergies  Allergen Reactions  . Demerol Nausea Only    Severe  . Hydromorphone Hcl Itching    All over the body.  . Tetracycline Hives    BP 118/78  Pulse 80  Temp(Src) 96.9 F (36.1 C) (Temporal)  Resp 18  Ht 5\' 3"  (1.6 m)  Wt 284 lb 4 oz (128.935 kg)  BMI 50.35 kg/m2     Review of Systems  Constitutional: Negative for fever, chills and diaphoresis.  HENT: Negative for ear pain, sore throat and trouble swallowing.   Eyes: Negative for photophobia and visual disturbance.  Respiratory: Negative for cough and choking.   Cardiovascular: Negative for chest pain and palpitations.  Gastrointestinal: Negative for nausea, vomiting, abdominal pain, diarrhea, constipation, anal bleeding and rectal pain.  Genitourinary: Negative for dysuria, frequency and difficulty urinating.  Musculoskeletal: Negative for myalgias and gait problem.  Skin: Negative for color change, pallor and rash.  Neurological:  Negative for dizziness, speech difficulty, weakness and numbness.  Hematological: Negative for adenopathy.  Psychiatric/Behavioral: Negative for confusion and agitation. The patient is not nervous/anxious.        Objective:   Physical Exam  Constitutional: She is oriented to person, place, and time. She appears well-developed and well-nourished. No distress.  HENT:  Head: Normocephalic.  Mouth/Throat: Oropharynx is clear and moist. No oropharyngeal exudate.  Eyes: Conjunctivae and EOM are normal. Pupils are equal, round, and reactive to  light. No scleral icterus.  Neck: Normal range of motion. No tracheal deviation present.  Cardiovascular: Normal rate and intact distal pulses.   Pulmonary/Chest: Effort normal. No respiratory distress. She exhibits no tenderness.  Abdominal: Soft. She exhibits no distension and no mass. There is no tenderness. There is no rebound and no guarding. Hernia confirmed negative in the right inguinal area and confirmed negative in the left inguinal area.       6x6x72mm small superficial wound.  Nearly closed!  Large Right abd VWH - no cellulitis/pain  Genitourinary: No vaginal discharge found.  Musculoskeletal: Normal range of motion. She exhibits no tenderness.  Lymphadenopathy:       Right: No inguinal adenopathy present.       Left: No inguinal adenopathy present.  Neurological: She is alert and oriented to person, place, and time. No cranial nerve deficit. She exhibits normal muscle tone. Coordination normal.  Skin: Skin is warm and dry. No rash noted. She is not diaphoretic.  Psychiatric: She has a normal mood and affect. Her behavior is normal.       Assessment:     Chronic abd wound nearly closed    Plan:     Superficial covering - nothing deep that needs packing  RTC 6 weeks for (hopefully) final check  Open VWH repair if wanted at [facility] if desired - not now

## 2011-04-14 ENCOUNTER — Encounter (INDEPENDENT_AMBULATORY_CARE_PROVIDER_SITE_OTHER): Admitting: Surgery

## 2011-04-20 ENCOUNTER — Ambulatory Visit (INDEPENDENT_AMBULATORY_CARE_PROVIDER_SITE_OTHER): Admitting: Pulmonary Disease

## 2011-04-20 ENCOUNTER — Encounter: Payer: Self-pay | Admitting: Pulmonary Disease

## 2011-04-20 DIAGNOSIS — G4733 Obstructive sleep apnea (adult) (pediatric): Secondary | ICD-10-CM

## 2011-04-20 DIAGNOSIS — J438 Other emphysema: Secondary | ICD-10-CM

## 2011-04-20 NOTE — Assessment & Plan Note (Signed)
The patient is wearing CPAP compliantly, and is having no issues with her equipment.  She feels that she is sleeping well with the device.  I've encouraged her to work aggressively on weight loss.

## 2011-04-20 NOTE — Assessment & Plan Note (Signed)
The patient is maintaining on a good bronchodilator regimen, and feels that her dyspnea on exertion is at baseline.  She has had a recent chest infection, but feels that she is nearly back to baseline after treatment.  I have asked her to continue on her current medical regimen.

## 2011-04-20 NOTE — Progress Notes (Signed)
  Subjective:    Patient ID: [name], female    DOB: March 27, 1951, 61 y.o.   MRN: [id number]  HPI Patient comes in today for followup of her known objective sleep apnea and COPD.  She had been doing very well, until a recent chest infection with mild exacerbation.  She was treated with a course of steroids and antibiotics, and is now almost back to her usual baseline.  She denies any significant cough or congestion.  She is wearing CPAP compliantly, and is having no issues with her mask or pressure.  She feels that she is sleeping well with the device.   Review of Systems  Constitutional: Negative for fever and unexpected weight change.  HENT: Positive for congestion. Negative for ear pain, nosebleeds, sore throat, rhinorrhea, sneezing, trouble swallowing, dental problem, postnasal drip and sinus pressure.   Eyes: Negative for redness and itching.  Respiratory: Positive for cough. Negative for chest tightness, shortness of breath and wheezing.   Cardiovascular: Positive for palpitations and leg swelling.  Gastrointestinal: Negative for nausea and vomiting.  Genitourinary: Negative for dysuria.  Musculoskeletal: Positive for joint swelling.  Skin: Negative for rash.  Neurological: Negative for headaches.  Hematological: Bruises/bleeds easily.  Psychiatric/Behavioral: Negative for dysphoric mood. The patient is not nervous/anxious.        Objective:   Physical Exam Morbidly obese female in no acute distress No skin breakdown or pressure necrosis from the CPAP mask Chest with fairly clear breath sounds, no wheezing Cardiac exam with regular rate and rhythm, no murmurs rubs or gallops Lower extremities with 1+ edema bilaterally, no cyanosis Alert and oriented, does not appear to be sleepy, moves all 4 extremities.       Assessment & Plan:

## 2011-04-20 NOTE — Patient Instructions (Signed)
No change in breathing medications Stay on cpap, keep up with mask changes and supplies Work on weight reduction followup with me in 6mos, but call if having issues.

## 2011-05-12 ENCOUNTER — Telehealth: Payer: Self-pay | Admitting: Pulmonary Disease

## 2011-05-12 NOTE — Telephone Encounter (Signed)
Per Aundra Millet, the forms have been received and are in [facility] desk. LMOVM to let Hilda Lias know that we do have the forms and they will be sent back once completed.

## 2011-08-03 ENCOUNTER — Other Ambulatory Visit: Payer: Self-pay | Admitting: Pulmonary Disease

## 2011-08-19 ENCOUNTER — Encounter (INDEPENDENT_AMBULATORY_CARE_PROVIDER_SITE_OTHER): Payer: Self-pay | Admitting: *Deleted

## 2011-09-06 ENCOUNTER — Other Ambulatory Visit: Payer: Self-pay | Admitting: Internal Medicine

## 2011-09-06 DIAGNOSIS — Z1231 Encounter for screening mammogram for malignant neoplasm of breast: Secondary | ICD-10-CM

## 2011-09-15 ENCOUNTER — Other Ambulatory Visit (INDEPENDENT_AMBULATORY_CARE_PROVIDER_SITE_OTHER): Payer: Self-pay | Admitting: *Deleted

## 2011-09-15 DIAGNOSIS — Z1211 Encounter for screening for malignant neoplasm of colon: Secondary | ICD-10-CM

## 2011-09-19 ENCOUNTER — Encounter (INDEPENDENT_AMBULATORY_CARE_PROVIDER_SITE_OTHER): Payer: Self-pay | Admitting: *Deleted

## 2011-09-19 ENCOUNTER — Telehealth (INDEPENDENT_AMBULATORY_CARE_PROVIDER_SITE_OTHER): Payer: Self-pay | Admitting: *Deleted

## 2011-09-19 DIAGNOSIS — Z1211 Encounter for screening for malignant neoplasm of colon: Secondary | ICD-10-CM

## 2011-09-19 NOTE — Telephone Encounter (Signed)
Patient needs movi prep 

## 2011-09-20 ENCOUNTER — Ambulatory Visit
Admission: RE | Admit: 2011-09-20 | Discharge: 2011-09-20 | Disposition: A | Discharge status: Home / Self Care | Source: Ambulatory Visit | Attending: Internal Medicine | Admitting: Internal Medicine

## 2011-09-20 DIAGNOSIS — Z1231 Encounter for screening mammogram for malignant neoplasm of breast: Secondary | ICD-10-CM

## 2011-09-20 MED ORDER — PEG-KCL-NACL-NASULF-NA ASC-C 100 G PO SOLR: 1.0000 | Freq: Once | ORAL | Status: DC

## 2011-10-18 ENCOUNTER — Encounter (INDEPENDENT_AMBULATORY_CARE_PROVIDER_SITE_OTHER): Payer: Self-pay | Admitting: *Deleted

## 2011-10-19 ENCOUNTER — Ambulatory Visit: Admitting: Pulmonary Disease

## 2011-10-27 ENCOUNTER — Encounter: Payer: Self-pay | Admitting: Pulmonary Disease

## 2011-10-27 ENCOUNTER — Ambulatory Visit (INDEPENDENT_AMBULATORY_CARE_PROVIDER_SITE_OTHER): Admitting: Pulmonary Disease

## 2011-10-27 VITALS — BP 108/62 | HR 81 | Temp 98.4°F | Ht 63.0 in | Wt 300.8 lb

## 2011-10-27 DIAGNOSIS — G4733 Obstructive sleep apnea (adult) (pediatric): Secondary | ICD-10-CM

## 2011-10-27 DIAGNOSIS — J438 Other emphysema: Secondary | ICD-10-CM

## 2011-10-27 NOTE — Progress Notes (Signed)
  Subjective:    Patient ID: [name], female    DOB: 03/21/1951, 61 y.o.   MRN: [id number]  HPI The patient comes in today for followup of her known COPD and also obstructive sleep apnea.  She's been wearing her CPAP compliant, and is having no issues with mask fit or pressure.  She feels that she sleeps as well as possible with the device, but continues to have sleep disruption because of chronic pain issues.  From a breathing standpoint she feels she is doing well on her current regimen.  She has not had a recent chest infection or COPD exacerbation.   Review of Systems  Constitutional: Negative for fever and unexpected weight change.  HENT: Negative for ear pain, nosebleeds, congestion, sore throat, rhinorrhea, sneezing, trouble swallowing, dental problem, postnasal drip and sinus pressure.   Eyes: Negative for redness and itching.  Respiratory: Negative for cough, chest tightness, shortness of breath and wheezing.   Cardiovascular: Negative for palpitations and leg swelling.  Gastrointestinal: Negative for nausea and vomiting.  Genitourinary: Negative for dysuria.  Musculoskeletal: Negative for joint swelling.  Skin: Negative for rash.  Neurological: Negative for headaches.  Hematological: Does not bruise/bleed easily.  Psychiatric/Behavioral: Negative for dysphoric mood. The patient is not nervous/anxious.        Objective:   Physical Exam Morbidly obese female in no acute distress No skin breakdown or pressure necrosis from the CPAP mask Chest fairly clear to auscultation, no wheezes noted Cardiac exam with regular rate and rhythm Lower extremities with mild edema, no cyanosis Alert and oriented, moves all 4 extremities.  Does not appear to be overly sleepy.       Assessment & Plan:

## 2011-10-27 NOTE — Assessment & Plan Note (Signed)
The patient is stable on her current bronchodilator regimen, and has not had any recent acute exacerbation.  I've asked her to continue on her inhalers as prescribed.

## 2011-10-27 NOTE — Assessment & Plan Note (Signed)
The patient is wearing CPAP compliantly without issues.  She feels that her sleep is more disrupted by chronic pain than anything else.  I encouraged her to work aggressively on weight loss.

## 2011-10-27 NOTE — Patient Instructions (Addendum)
No change in breathing medications Stay on cpap, keep up with mask changes. Work on weight loss and conditioning. followup with me in 6mos.

## 2011-11-18 ENCOUNTER — Telehealth (INDEPENDENT_AMBULATORY_CARE_PROVIDER_SITE_OTHER): Payer: Self-pay | Admitting: *Deleted

## 2011-11-18 NOTE — Telephone Encounter (Signed)
PCP/Requesting MD: fagan  Name & DOB: [name] 05/14/1950     Procedure: tcs  Reason/Indication:  screening  Has patient had this procedure before?  yes  If so, when, by whom and where?  2002  Is there a family history of colon cancer?  no  Who?  What age when diagnosed?    Is patient diabetic?   yes      Does patient have prosthetic heart valve?  no  Do you have a pacemaker?  no  Has patient had joint replacement within last 12 months?  no  Is patient on Coumadin, Plavix and/or Aspirin? no  Medications: see epic  Allergies: see epic  Medication Adjustment: (hold metformin day before, hold evening dose of byeta day before, continue novolog, lantus: 40 units in am and 30 units in pm  Procedure date & time: 12/08/11 at 830

## 2011-11-22 NOTE — Telephone Encounter (Signed)
agree

## 2011-11-23 ENCOUNTER — Encounter (HOSPITAL_COMMUNITY): Payer: Self-pay | Admitting: Pharmacy Technician

## 2011-12-07 MED ORDER — SODIUM CHLORIDE 0.45 % IV SOLN
Freq: Once | INTRAVENOUS | Status: AC
  Administered 2011-12-08: 1000 mL via INTRAVENOUS

## 2011-12-08 ENCOUNTER — Encounter (HOSPITAL_COMMUNITY)
Admission: RE | Disposition: A | Discharge status: Home / Self Care | Payer: Self-pay | Source: Ambulatory Visit | Attending: Internal Medicine

## 2011-12-08 ENCOUNTER — Ambulatory Visit (HOSPITAL_COMMUNITY)
Admission: RE | Admit: 2011-12-08 | Discharge: 2011-12-08 | Disposition: A | Discharge status: Home / Self Care | Source: Ambulatory Visit | Attending: Internal Medicine | Admitting: Internal Medicine

## 2011-12-08 ENCOUNTER — Encounter (HOSPITAL_COMMUNITY): Payer: Self-pay | Admitting: *Deleted

## 2011-12-08 DIAGNOSIS — K5730 Diverticulosis of large intestine without perforation or abscess without bleeding: Secondary | ICD-10-CM

## 2011-12-08 DIAGNOSIS — I10 Essential (primary) hypertension: Secondary | ICD-10-CM | POA: Insufficient documentation

## 2011-12-08 DIAGNOSIS — E785 Hyperlipidemia, unspecified: Secondary | ICD-10-CM | POA: Insufficient documentation

## 2011-12-08 DIAGNOSIS — D126 Benign neoplasm of colon, unspecified: Secondary | ICD-10-CM | POA: Insufficient documentation

## 2011-12-08 DIAGNOSIS — Z1211 Encounter for screening for malignant neoplasm of colon: Secondary | ICD-10-CM

## 2011-12-08 DIAGNOSIS — K648 Other hemorrhoids: Secondary | ICD-10-CM | POA: Insufficient documentation

## 2011-12-08 DIAGNOSIS — K6389 Other specified diseases of intestine: Secondary | ICD-10-CM

## 2011-12-08 DIAGNOSIS — E119 Type 2 diabetes mellitus without complications: Secondary | ICD-10-CM | POA: Insufficient documentation

## 2011-12-08 HISTORY — PX: COLONOSCOPY: SHX5424

## 2011-12-08 LAB — GLUCOSE, CAPILLARY: Glucose-Capillary: 153 mg/dL — ABNORMAL HIGH (ref 70–99)

## 2011-12-08 SURGERY — COLONOSCOPY
Anesthesia: Moderate Sedation

## 2011-12-08 MED ORDER — FENTANYL CITRATE 0.05 MG/ML IJ SOLN
INTRAMUSCULAR | Status: DC | PRN
  Administered 2011-12-08 (×2): 25 ug via INTRAVENOUS

## 2011-12-08 MED ORDER — STERILE WATER FOR IRRIGATION IR SOLN
Status: DC | PRN
  Administered 2011-12-08: 09:00:00

## 2011-12-08 MED ORDER — MIDAZOLAM HCL 5 MG/5ML IJ SOLN
INTRAMUSCULAR | Status: AC
  Filled 2011-12-08: qty 10

## 2011-12-08 MED ORDER — FENTANYL CITRATE 0.05 MG/ML IJ SOLN
INTRAMUSCULAR | Status: AC
  Filled 2011-12-08: qty 2

## 2011-12-08 MED ORDER — MEPERIDINE HCL 50 MG/ML IJ SOLN
INTRAMUSCULAR | Status: AC
  Filled 2011-12-08: qty 1

## 2011-12-08 MED ORDER — MIDAZOLAM HCL 5 MG/5ML IJ SOLN
INTRAMUSCULAR | Status: DC | PRN
  Administered 2011-12-08 (×4): 2 mg via INTRAVENOUS

## 2011-12-08 NOTE — Op Note (Signed)
COLONOSCOPY PROCEDURE REPORT  PATIENT:  [name]  MR#:  [id number] Birthdate:  [birth year], 61 y.o., female Endoscopist:  Dr. Malissa Hippo, MD Referred By:  Dr. Carylon Perches, MD Procedure Date: 12/08/2011  Procedure:   Colonoscopy  Indications:  Patient is 61 year old Caucasian female was undergoing average risk screening colonoscopy.  Informed Consent:  The procedure and risks were reviewed with the patient and informed consent was obtained.  Medications:  Fentanyl 50 mcg IV Versed 8 mg IV  Description of procedure:  After a digital rectal exam was performed, that colonoscope was advanced from the anus through the rectum and colon to the area of the cecum, ileocecal valve and appendiceal orifice. The cecum was deeply intubated. These structures were well-seen and photographed for the record. From the level of the cecum and ileocecal valve, the scope was slowly and cautiously withdrawn. The mucosal surfaces were carefully surveyed utilizing scope tip to flexion to facilitate fold flattening as needed. The scope was pulled down into the rectum where a thorough exam including retroflexion was performed.  Findings:   Prep satisfactory. Scattered diverticula throughout the colon. 5 mm polyp ablated via cold biopsy from cecum. He millimeter polyp ablated via cold biopsy from transverse colon. 10 x 20 mm submucosal lipoma at hepatic flexure. Small hyperplastic polyp at rectosigmoid junction was left alone Normal rectal mucosa. Small hemorrhoids below the dentate line.  Therapeutic/Diagnostic Maneuvers Performed:  See above  Complications:  None  Cecal Withdrawal Time:  15 minutes  Impression:  Pancolonic diverticulosis. 5 mm cecal polyp ablated via cold biopsy. 3 mm polyp at transverse colon ablated via cold biopsy. Submucosal lipoma at hepatic flexure. Small external hemorrhoids.  Recommendations:  Standard instructions given. I will contact patient with biopsy results and  further recommendations.  REHMAN,NAJEEB U  12/08/2011 9:31 AM  CC: Dr. Carylon Perches, MD & Dr. Bonnetta Barry ref. provider found

## 2011-12-08 NOTE — H&P (Signed)
[name] is an 61 y.o. female.   Chief Complaint: Patient is here for colonoscopy. HPI: Patient is 61 year old Caucasian female with multiple medical problems who is in for screening colonoscopy. Patient's last exam was 11 years ago. She denies rectal bleeding or change in her bowel habits. Family history is positive for various malignancies her mother side of the family. Other died of lung cancer at age 86. One maternal uncle had APR for rectal carcinoma in his 62,s and died of cardiac disease.  Past Medical History  Diagnosis Date  . Diabetes mellitus   . Hyperlipidemia   . GERD (gastroesophageal reflux disease)   . Asthma   . Hypertension   . Morbid obesity   . Obstructive sleep apnea syndrome   . Septic arthritis of knee, right 04/2010  . 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 Rheumatoid arthritis     on methotrexate therapy  . Depression     Past Surgical History  Procedure Date  . Total knee arthroplasty 05/07/2010    right knee  . Revision total knee arthroplasty 06/09/2010    I and D   . Appendectomy 1967  . Abdominal hysterectomy 1992  . Cholecystectomy 1992    laparoscopic  . Aorto-pulmonary window repair 1992    for cardiac tamponade  . Hernia repair 2001    open LOA / VWH repair w mesh.  Dr. Lovell Sheehan  . Hernia repair 2007    open redo LOA / VWH repair w mesh.  Dr. Lovell Sheehan  . Abdominal wall mesh  removal 08/13/2009    with removal of retained stitches  . Foreign body removal abdominal 01/04/2010    stitch abscesses    Family History  Problem Relation Age of Onset  . Cancer Mother   . Hyperlipidemia Mother   . Diabetes Mother   . Diabetes Father   . COPD Father   . Hyperlipidemia Brother   . Hypertension Brother    Social History:  reports that she quit smoking about 19 years ago. Her smoking use included Cigarettes. She has a 50 pack-year smoking history. She has never used smokeless tobacco. She reports that she does not drink alcohol or  use illicit drugs.  Allergies:  Allergies  Allergen Reactions  . Demerol Nausea Only    Severe  . Hydromorphone Hcl Itching    All over the body.  . Tetracycline Hives    Medications Prior to Admission  Medication Sig Dispense Refill  . albuterol (PROVENTIL HFA) 108 (90 BASE) MCG/ACT inhaler Inhale 2 puffs into the lungs every 6 (six) hours as needed. Shortness of Breath      . Calcium Carbonate-Vitamin D (CALCIUM 600+D) 600-400 MG-UNIT per tablet Take 1 tablet by mouth 2 (two) times daily.        . diclofenac-misoprostol (ARTHROTEC 75) 75-0.2 MG per tablet Take 1 tablet by mouth 2 (two) times daily.        Marland Kitchen etanercept (ENBREL) 50 MG/ML injection Inject 50 mg into the skin once a week. On Mondays       . exenatide (BYETTA 10 MCG PEN) 10 MCG/0.04ML SOLN Inject into the skin 2 (two) times daily.        . fexofenadine (ALLEGRA) 180 MG tablet Take 180 mg by mouth daily.        . fish oil-omega-3 fatty acids 1000 MG capsule Take 2 g by mouth every evening.       . fluticasone-salmeterol (ADVAIR HFA) 115-21 MCG/ACT  inhaler Inhale 2 puffs into the lungs 2 (two) times daily.  3 Inhaler  3  . furosemide (LASIX) 40 MG tablet Take 40 mg by mouth as needed. Fluid      . gabapentin (NEURONTIN) 300 MG capsule Take 600 mg by mouth at bedtime.       Marland Kitchen ibuprofen (ADVIL,MOTRIN) 200 MG tablet Take 400-600 mg by mouth every 8 (eight) hours as needed. Pain      . insulin glargine (LANTUS) 100 UNIT/ML injection Per patient 80 in am and 80 in pm.      . metFORMIN (GLUCOPHAGE-XR) 500 MG 24 hr tablet Take 2,000 mg by mouth daily.        . methocarbamol (ROBAXIN) 750 MG tablet Take 750 mg by mouth every 8 (eight) hours as needed. Muscle Spasms      . omeprazole (PRILOSEC) 40 MG capsule Take 40 mg by mouth daily.      . peg 3350 powder (MOVIPREP) 100 G SOLR Take 1 kit (100 g total) by mouth once.  1 kit  0  . sertraline (ZOLOFT) 100 MG tablet Take 100 mg by mouth daily.        . simvastatin (ZOCOR) 40 MG tablet  Take 40 mg by mouth at bedtime.        Marland Kitchen telmisartan (MICARDIS) 40 MG tablet Take 40 mg by mouth daily.        Marland Kitchen tiotropium (SPIRIVA) 18 MCG inhalation capsule Place 18 mcg into inhaler and inhale daily.        . insulin aspart (NOVOLOG) 100 UNIT/ML injection Inject 6 Units subcutaneously every morning, 10 Units subcutaneously at lunchtime and 6 Units subcutaneously at bedtime as directed        Results for orders placed during the hospital encounter of 12/08/11 (from the past 48 hour(s))  GLUCOSE, CAPILLARY     Status: Abnormal   Collection Time   12/08/11  7:53 AM      Component Value Range Comment   Glucose-Capillary 153 (*) 70 - 99 mg/dL    No results found.  ROS  Blood pressure 120/61, pulse 79, temperature 97.9 F (36.6 C), temperature source Oral, resp. rate 22, height 5\' 3"  (1.6 m), weight 287 lb (130.182 kg), SpO2 96.00%. Physical Exam  Constitutional: She appears well-developed.       Obese Caucasian female  HENT:  Mouth/Throat: Oropharynx is clear and moist.  Eyes: Conjunctivae are normal. No scleral icterus.  Neck: No thyromegaly present.  Cardiovascular: Normal rate, regular rhythm and normal heart sounds.   No murmur heard. Respiratory: Effort normal and breath sounds normal.  GI: She exhibits no mass.       Abdomen is protuberant, soft  With extensive scarring and ventral hernia. No organomegaly or masses  Musculoskeletal: Edema: trace.  Lymphadenopathy:    She has no cervical adenopathy.  Neurological: She is alert.  Skin: Skin is warm and dry.     Assessment/Plan Average risk screening colonoscopy.  Erice Ahles U 12/08/2011, 8:41 AM

## 2011-12-14 ENCOUNTER — Encounter (HOSPITAL_COMMUNITY): Payer: Self-pay | Admitting: Internal Medicine

## 2011-12-15 ENCOUNTER — Encounter (INDEPENDENT_AMBULATORY_CARE_PROVIDER_SITE_OTHER): Payer: Self-pay | Admitting: *Deleted

## 2012-01-11 ENCOUNTER — Other Ambulatory Visit: Payer: Self-pay | Admitting: Orthopedic Surgery

## 2012-01-11 DIAGNOSIS — M542 Cervicalgia: Secondary | ICD-10-CM

## 2012-01-11 DIAGNOSIS — R202 Paresthesia of skin: Secondary | ICD-10-CM

## 2012-01-12 ENCOUNTER — Ambulatory Visit
Admission: RE | Admit: 2012-01-12 | Discharge: 2012-01-12 | Disposition: A | Discharge status: Home / Self Care | Source: Ambulatory Visit | Attending: Orthopedic Surgery | Admitting: Orthopedic Surgery

## 2012-01-12 DIAGNOSIS — R202 Paresthesia of skin: Secondary | ICD-10-CM

## 2012-01-12 DIAGNOSIS — M542 Cervicalgia: Secondary | ICD-10-CM

## 2012-01-14 ENCOUNTER — Other Ambulatory Visit

## 2012-01-19 ENCOUNTER — Other Ambulatory Visit: Payer: Self-pay | Admitting: Pulmonary Disease

## 2012-04-26 ENCOUNTER — Ambulatory Visit (INDEPENDENT_AMBULATORY_CARE_PROVIDER_SITE_OTHER): Admitting: Pulmonary Disease

## 2012-04-26 ENCOUNTER — Encounter: Payer: Self-pay | Admitting: Pulmonary Disease

## 2012-04-26 VITALS — BP 132/54 | HR 85 | Temp 98.1°F | Ht 63.0 in | Wt 293.0 lb

## 2012-04-26 DIAGNOSIS — G4733 Obstructive sleep apnea (adult) (pediatric): Secondary | ICD-10-CM

## 2012-04-26 DIAGNOSIS — J438 Other emphysema: Secondary | ICD-10-CM

## 2012-04-26 NOTE — Progress Notes (Signed)
  Subjective:    Patient ID: [name], female    DOB: 1950-10-22, 62 y.o.   MRN: [id number]  HPI The patient comes in today for followup of her known COPD and obstructive sleep apnea.  She had been wearing CPAP compliance, and is having no issues with her mask fit.  She does occasionally awaken at night and feels the pressure is too high, but overall she feels that she is sleeping well with excellent daytime alertness.  Her breathing is also near her usual baseline, and she denies having a recent acute exacerbation.   Review of Systems  Constitutional: Negative for fever and unexpected weight change.  HENT: Positive for congestion ( lasted only a few days), rhinorrhea and postnasal drip. Negative for ear pain, nosebleeds, sore throat, sneezing, trouble swallowing, dental problem and sinus pressure.   Eyes: Negative for redness and itching.  Respiratory: Negative for cough, chest tightness, shortness of breath and wheezing.   Cardiovascular: Positive for leg swelling. Negative for palpitations.  Gastrointestinal: Negative for nausea and vomiting.  Genitourinary: Negative for dysuria.  Musculoskeletal: Negative for joint swelling.       Patient has RA  Skin: Negative for rash.  Neurological: Negative for headaches.  Hematological: Does not bruise/bleed easily.  Psychiatric/Behavioral: Negative for dysphoric mood. The patient is not nervous/anxious.        Objective:   Physical Exam Morbidly obese female in no acute distress No skin breakdown or pressure necrosis from the CPAP mask Nose without purulence or discharge noted Chest with mild decrease in breath sounds, otherwise clear Cardiac exam with regular rate rhythm Lower extremities with edema noted, no cyanosis Alert and oriented, moves all 4 extremities.        Assessment & Plan:

## 2012-04-26 NOTE — Assessment & Plan Note (Signed)
The patient is doing well on her CPAP, and feels that she is sleeping adequately with good daytime alertness.  I have asked her to try using the ramp button during the night if she awakens with a feeling of too much pressure.  She is to call me if she continues to have issues with this.  I commended her on losing 7 pounds since last visit, and asked her to continue working on weight loss.

## 2012-04-26 NOTE — Assessment & Plan Note (Signed)
The patient has continued on her bronchodilator regimen, and feels that her breathing has been stable.  She has not had a recent acute exacerbation.  I have asked her to continue on her same medications, and to work aggressively on weight loss.

## 2012-04-26 NOTE — Patient Instructions (Addendum)
Continue on cpap Keep working on weight loss.  You are making progress. No change in breathing medications followup with me in 6mos.

## 2012-06-29 ENCOUNTER — Other Ambulatory Visit: Payer: Self-pay | Admitting: Pulmonary Disease

## 2012-07-28 ENCOUNTER — Other Ambulatory Visit: Payer: Self-pay | Admitting: Pulmonary Disease

## 2012-07-30 ENCOUNTER — Encounter: Payer: Self-pay | Admitting: *Deleted

## 2012-08-20 ENCOUNTER — Other Ambulatory Visit: Payer: Self-pay

## 2012-08-20 DIAGNOSIS — Z1231 Encounter for screening mammogram for malignant neoplasm of breast: Secondary | ICD-10-CM

## 2012-09-26 ENCOUNTER — Other Ambulatory Visit: Payer: Self-pay | Admitting: Dermatology

## 2012-09-26 ENCOUNTER — Ambulatory Visit
Admission: RE | Admit: 2012-09-26 | Discharge: 2012-09-26 | Disposition: A | Discharge status: Home / Self Care | Source: Ambulatory Visit

## 2012-09-26 DIAGNOSIS — Z1231 Encounter for screening mammogram for malignant neoplasm of breast: Secondary | ICD-10-CM

## 2012-10-09 DIAGNOSIS — M5412 Radiculopathy, cervical region: Secondary | ICD-10-CM | POA: Diagnosis not present

## 2012-10-11 ENCOUNTER — Other Ambulatory Visit: Payer: Self-pay | Admitting: Orthopedic Surgery

## 2012-10-11 DIAGNOSIS — I10 Essential (primary) hypertension: Secondary | ICD-10-CM | POA: Diagnosis not present

## 2012-10-11 DIAGNOSIS — M545 Low back pain, unspecified: Secondary | ICD-10-CM

## 2012-10-11 DIAGNOSIS — E1129 Type 2 diabetes mellitus with other diabetic kidney complication: Secondary | ICD-10-CM | POA: Diagnosis not present

## 2012-10-20 ENCOUNTER — Ambulatory Visit
Admission: RE | Admit: 2012-10-20 | Discharge: 2012-10-20 | Disposition: A | Discharge status: Home / Self Care | Payer: Medicare Other | Source: Ambulatory Visit | Attending: Orthopedic Surgery | Admitting: Orthopedic Surgery

## 2012-10-20 DIAGNOSIS — M47817 Spondylosis without myelopathy or radiculopathy, lumbosacral region: Secondary | ICD-10-CM | POA: Diagnosis not present

## 2012-10-20 DIAGNOSIS — M5126 Other intervertebral disc displacement, lumbar region: Secondary | ICD-10-CM | POA: Diagnosis not present

## 2012-10-20 DIAGNOSIS — M545 Low back pain: Secondary | ICD-10-CM

## 2012-10-24 ENCOUNTER — Ambulatory Visit: Admitting: Pulmonary Disease

## 2012-10-26 DIAGNOSIS — E1149 Type 2 diabetes mellitus with other diabetic neurological complication: Secondary | ICD-10-CM | POA: Diagnosis not present

## 2012-10-26 DIAGNOSIS — E119 Type 2 diabetes mellitus without complications: Secondary | ICD-10-CM | POA: Diagnosis not present

## 2012-10-29 DIAGNOSIS — M159 Polyosteoarthritis, unspecified: Secondary | ICD-10-CM | POA: Diagnosis not present

## 2012-10-29 DIAGNOSIS — M069 Rheumatoid arthritis, unspecified: Secondary | ICD-10-CM | POA: Diagnosis not present

## 2012-10-30 DIAGNOSIS — M48061 Spinal stenosis, lumbar region without neurogenic claudication: Secondary | ICD-10-CM | POA: Diagnosis not present

## 2012-10-30 DIAGNOSIS — M5126 Other intervertebral disc displacement, lumbar region: Secondary | ICD-10-CM | POA: Diagnosis not present

## 2012-11-06 DIAGNOSIS — IMO0002 Reserved for concepts with insufficient information to code with codable children: Secondary | ICD-10-CM | POA: Diagnosis not present

## 2012-12-04 ENCOUNTER — Other Ambulatory Visit: Payer: Self-pay | Admitting: *Deleted

## 2012-12-04 ENCOUNTER — Ambulatory Visit (INDEPENDENT_AMBULATORY_CARE_PROVIDER_SITE_OTHER): Payer: Medicare Other | Admitting: Pulmonary Disease

## 2012-12-04 ENCOUNTER — Encounter: Payer: Self-pay | Admitting: Pulmonary Disease

## 2012-12-04 VITALS — BP 118/70 | HR 84 | Temp 97.1°F | Ht 63.0 in | Wt 292.0 lb

## 2012-12-04 DIAGNOSIS — J449 Chronic obstructive pulmonary disease, unspecified: Secondary | ICD-10-CM

## 2012-12-04 DIAGNOSIS — G4733 Obstructive sleep apnea (adult) (pediatric): Secondary | ICD-10-CM | POA: Diagnosis not present

## 2012-12-04 DIAGNOSIS — J4489 Other specified chronic obstructive pulmonary disease: Secondary | ICD-10-CM

## 2012-12-04 MED ORDER — ALBUTEROL SULFATE HFA 108 (90 BASE) MCG/ACT IN AERS: 2.0000 | INHALATION_SPRAY | Freq: Four times a day (QID) | RESPIRATORY_TRACT | Status: DC | PRN

## 2012-12-04 NOTE — Progress Notes (Signed)
  Subjective:    Patient ID: [name], female    DOB: 1950-06-07, 62 y.o.   MRN: [id number]  HPI The patient comes in today for followup of her known COPD and obstructive sleep apnea.  She feels that her breathing is at baseline, and is continuing on her bronchodilator regimen.  She has had no acute exacerbation or pulmonary infection since last visit.  She is wearing CPAP compliantly, and would like to try a different mask when she is due for a replacement.  Her weight is down 1 pound from last year.   Review of Systems  Constitutional: Negative for fever and unexpected weight change.  HENT: Positive for congestion, rhinorrhea and postnasal drip. Negative for ear pain, nosebleeds, sore throat, sneezing, trouble swallowing, dental problem and sinus pressure.   Eyes: Negative for redness and itching.  Respiratory: Positive for shortness of breath. Negative for cough, chest tightness and wheezing.   Cardiovascular: Negative for palpitations and leg swelling.  Gastrointestinal: Negative for nausea and vomiting.  Genitourinary: Negative for dysuria.  Musculoskeletal: Negative for joint swelling.  Skin: Negative for rash.  Neurological: Negative for headaches.  Hematological: Does not bruise/bleed easily.  Psychiatric/Behavioral: Negative for dysphoric mood. The patient is not nervous/anxious.        Objective:   Physical Exam Morbidly obese female in no acute distress Nose without purulence or discharge noted No skin breakdown or pressure necrosis from the CPAP mask Neck without lymphadenopathy or thyromegaly Chest with clear breath sounds bilaterally, no wheezing Cardiac exam regular rate and rhythm Lower extremities with mild edema, no cyanosis Alert and oriented, moves all 4 extremities.       Assessment & Plan:

## 2012-12-04 NOTE — Assessment & Plan Note (Signed)
She is doing well on cpap, and would like to try different masks when she is able to do so from an insurance standpoint.

## 2012-12-04 NOTE — Patient Instructions (Addendum)
Continue on current breathing medications. Will change your rescue inhaler to proair as directed by insurance, and will send 3mos prescription with one year refills. Keep up with cpap supplies and mask changes Work on weight loss as able.  followup with me in 6mos .

## 2012-12-04 NOTE — Telephone Encounter (Signed)
Pt insurance requests pt be switched from Proventil to ProAir 3 mo supply with 4 refills sent to [facility].

## 2012-12-04 NOTE — Assessment & Plan Note (Signed)
The pt is remaining stable from a pulmonary standpoint on her current BD regimen.  I have asked her to continue on this, and to work on conditioning/weight loss.

## 2012-12-11 DIAGNOSIS — IMO0002 Reserved for concepts with insufficient information to code with codable children: Secondary | ICD-10-CM | POA: Diagnosis not present

## 2012-12-25 DIAGNOSIS — M47817 Spondylosis without myelopathy or radiculopathy, lumbosacral region: Secondary | ICD-10-CM | POA: Diagnosis not present

## 2012-12-31 ENCOUNTER — Encounter (HOSPITAL_COMMUNITY): Payer: Self-pay | Admitting: Emergency Medicine

## 2012-12-31 ENCOUNTER — Emergency Department (HOSPITAL_COMMUNITY): Payer: Medicare Other

## 2012-12-31 ENCOUNTER — Emergency Department (HOSPITAL_COMMUNITY)
Admission: EM | Admit: 2012-12-31 | Discharge: 2012-12-31 | Disposition: A | Discharge status: Home / Self Care | Payer: Medicare Other | Attending: Emergency Medicine | Admitting: Emergency Medicine

## 2012-12-31 DIAGNOSIS — Z87891 Personal history of nicotine dependence: Secondary | ICD-10-CM | POA: Diagnosis not present

## 2012-12-31 DIAGNOSIS — S0993XA Unspecified injury of face, initial encounter: Secondary | ICD-10-CM | POA: Insufficient documentation

## 2012-12-31 DIAGNOSIS — E119 Type 2 diabetes mellitus without complications: Secondary | ICD-10-CM | POA: Diagnosis not present

## 2012-12-31 DIAGNOSIS — Z794 Long term (current) use of insulin: Secondary | ICD-10-CM | POA: Insufficient documentation

## 2012-12-31 DIAGNOSIS — S298XXA Other specified injuries of thorax, initial encounter: Secondary | ICD-10-CM | POA: Diagnosis not present

## 2012-12-31 DIAGNOSIS — E785 Hyperlipidemia, unspecified: Secondary | ICD-10-CM | POA: Insufficient documentation

## 2012-12-31 DIAGNOSIS — Y9389 Activity, other specified: Secondary | ICD-10-CM | POA: Insufficient documentation

## 2012-12-31 DIAGNOSIS — R4182 Altered mental status, unspecified: Secondary | ICD-10-CM | POA: Diagnosis not present

## 2012-12-31 DIAGNOSIS — R296 Repeated falls: Secondary | ICD-10-CM | POA: Insufficient documentation

## 2012-12-31 DIAGNOSIS — F3289 Other specified depressive episodes: Secondary | ICD-10-CM | POA: Insufficient documentation

## 2012-12-31 DIAGNOSIS — IMO0002 Reserved for concepts with insufficient information to code with codable children: Secondary | ICD-10-CM | POA: Diagnosis not present

## 2012-12-31 DIAGNOSIS — Z79899 Other long term (current) drug therapy: Secondary | ICD-10-CM | POA: Insufficient documentation

## 2012-12-31 DIAGNOSIS — F329 Major depressive disorder, single episode, unspecified: Secondary | ICD-10-CM | POA: Diagnosis not present

## 2012-12-31 DIAGNOSIS — F29 Unspecified psychosis not due to a substance or known physiological condition: Secondary | ICD-10-CM | POA: Diagnosis not present

## 2012-12-31 DIAGNOSIS — S0990XA Unspecified injury of head, initial encounter: Secondary | ICD-10-CM | POA: Diagnosis not present

## 2012-12-31 DIAGNOSIS — R51 Headache: Secondary | ICD-10-CM | POA: Diagnosis not present

## 2012-12-31 DIAGNOSIS — R55 Syncope and collapse: Secondary | ICD-10-CM | POA: Diagnosis not present

## 2012-12-31 DIAGNOSIS — K219 Gastro-esophageal reflux disease without esophagitis: Secondary | ICD-10-CM | POA: Insufficient documentation

## 2012-12-31 DIAGNOSIS — M069 Rheumatoid arthritis, unspecified: Secondary | ICD-10-CM | POA: Diagnosis not present

## 2012-12-31 DIAGNOSIS — Y92009 Unspecified place in unspecified non-institutional (private) residence as the place of occurrence of the external cause: Secondary | ICD-10-CM | POA: Insufficient documentation

## 2012-12-31 DIAGNOSIS — I10 Essential (primary) hypertension: Secondary | ICD-10-CM | POA: Insufficient documentation

## 2012-12-31 DIAGNOSIS — S199XXA Unspecified injury of neck, initial encounter: Secondary | ICD-10-CM | POA: Diagnosis not present

## 2012-12-31 DIAGNOSIS — J45909 Unspecified asthma, uncomplicated: Secondary | ICD-10-CM | POA: Diagnosis not present

## 2012-12-31 DIAGNOSIS — M1710 Unilateral primary osteoarthritis, unspecified knee: Secondary | ICD-10-CM | POA: Insufficient documentation

## 2012-12-31 LAB — BASIC METABOLIC PANEL
BUN: 17 mg/dL (ref 6–23)
Chloride: 99 mEq/L (ref 96–112)
Creatinine, Ser: 0.83 mg/dL (ref 0.50–1.10)
GFR calc non Af Amer: 74 mL/min — ABNORMAL LOW (ref >90)
Glucose, Bld: 124 mg/dL — ABNORMAL HIGH (ref 70–99)
Potassium: 3.7 mEq/L (ref 3.5–5.1)

## 2012-12-31 LAB — CBC WITH DIFFERENTIAL/PLATELET
Basophils Absolute: 0 K/uL (ref 0.0–0.1)
Basophils Relative: 0 % (ref 0–1)
Eosinophils Absolute: 0.1 K/uL (ref 0.0–0.7)
Eosinophils Relative: 1 % (ref 0–5)
HCT: 40.3 % (ref 36.0–46.0)
Hemoglobin: 12.9 g/dL (ref 12.0–15.0)
Lymphocytes Relative: 13 % (ref 12–46)
Lymphs Abs: 1.1 K/uL (ref 0.7–4.0)
MCH: 27.3 pg (ref 26.0–34.0)
MCHC: 32 g/dL (ref 30.0–36.0)
MCV: 85.4 fL (ref 78.0–100.0)
Monocytes Absolute: 0.6 K/uL (ref 0.1–1.0)
Monocytes Relative: 7 % (ref 3–12)
Neutro Abs: 6.8 K/uL (ref 1.7–7.7)
Neutrophils Relative %: 79 % — ABNORMAL HIGH (ref 43–77)
Platelets: 210 K/uL (ref 150–400)
RBC: 4.72 MIL/uL (ref 3.87–5.11)
RDW: 15.8 % — ABNORMAL HIGH (ref 11.5–15.5)
WBC: 8.6 K/uL (ref 4.0–10.5)

## 2012-12-31 LAB — URINALYSIS, ROUTINE W REFLEX MICROSCOPIC
Bilirubin Urine: NEGATIVE
Hgb urine dipstick: NEGATIVE
Ketones, ur: NEGATIVE mg/dL
Protein, ur: NEGATIVE mg/dL
Urobilinogen, UA: 0.2 mg/dL (ref 0.0–1.0)

## 2012-12-31 LAB — URINE MICROSCOPIC-ADD ON

## 2012-12-31 MED ORDER — SODIUM CHLORIDE 0.9 % IV BOLUS (SEPSIS)
1000.0000 mL | Freq: Once | INTRAVENOUS | Status: AC
  Administered 2012-12-31: 1000 mL via INTRAVENOUS

## 2012-12-31 MED ORDER — SODIUM CHLORIDE 0.9 % IV SOLN
INTRAVENOUS | Status: DC
  Administered 2012-12-31: 16:00:00 via INTRAVENOUS

## 2012-12-31 NOTE — ED Provider Notes (Signed)
CSN: [id number]     Arrival date & time 12/31/12  1254 History  This chart was scribed for Flint Melter, MD by Bennett Scrape, ED Scribe. This patient was seen in room [facility] and the patient's care was started at 2:48 PM.   Chief Complaint  Patient presents with  . Fall  . Dizziness    The history is provided by the patient. No language interpreter was used.    HPI Comments: [name] is a 62 y.o. female brought in by ambulance, who presents to the Emergency Department complaining of a fall that occurred this morning while getting dressed in an unknown location in her home. Pt states that she doesn't remember falling or waking up on the floor. She states that the next thing she remembers is "struggling hard to get up". She states that once she was able to get up, she ambulated to her bed but doesn't not remember anything else. Her neighbor was on the phone with the pt at the time when she fell and called EMS. Pt states that she remembers when EMS arrived, she was completely nude and didn't understand why. She denies that she has eaten yet today but states that this is not unusual for her. She reports dizziness prior to the fall and a HA after the fall. She reports one prior episode of similar symptoms several years ago that she attributes to sleep walking. She denies any recent illness, CP, weakness and cough.   PCP is Dr. [name]   Past Medical History  Diagnosis Date  . Diabetes mellitus   . Hyperlipidemia   . GERD (gastroesophageal reflux disease)   . Asthma   . Hypertension   . Morbid obesity   . Obstructive sleep apnea syndrome   . Septic arthritis of knee, right 04/2010  . 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 Rheumatoid arthritis(714.0)     on methotrexate therapy  . Depression    Past Surgical History  Procedure Laterality Date  . Total knee arthroplasty  05/07/2010    right knee  . Revision total knee arthroplasty  06/09/2010    I and D   .  Appendectomy  1967  . Abdominal hysterectomy  1992  . Cholecystectomy  1992    laparoscopic  . Aorto-pulmonary window repair  1992    for cardiac tamponade  . Hernia repair  2001    open LOA / VWH repair w mesh.  Dr. Lovell Sheehan  . Hernia repair  2007    open redo LOA / VWH repair w mesh.  Dr. Lovell Sheehan  . Abdominal wall mesh  removal  08/13/2009    with removal of retained stitches  . Foreign body removal abdominal  01/04/2010    stitch abscesses  . Colonoscopy  12/08/2011    Procedure: COLONOSCOPY;  Surgeon: Malissa Hippo, MD;  Location: [facility];  Service: Endoscopy;  Laterality: N/A;  830   Family History  Problem Relation Age of Onset  . Cancer Mother   . Hyperlipidemia Mother   . Diabetes Mother   . Diabetes Father   . COPD Father   . Hyperlipidemia Brother   . Hypertension Brother    History  Substance Use Topics  . Smoking status: Former Smoker -- 2.00 packs/day for 25 years    Types: Cigarettes    Quit date: 04/11/1992  . Smokeless tobacco: Never Used  . Alcohol Use: No   No OB history provided.  Review of  Systems  Cardiovascular: Negative for chest pain.  Musculoskeletal: Negative for back pain and arthralgias.  Neurological: Positive for dizziness and headaches.  Psychiatric/Behavioral: Positive for confusion.  All other systems reviewed and are negative.    Allergies  Demerol; Hydromorphone hcl; and Tetracycline  Home Medications   Current Outpatient Rx  Name  Route  Sig  Dispense  Refill  . ADVAIR HFA 115-21 MCG/ACT inhaler      INHALE 2 PUFFS INTO THE LUNGS TWICE DAILY   3 Inhaler   4   . albuterol (PROAIR HFA) 108 (90 BASE) MCG/ACT inhaler   Inhalation   Inhale 2 puffs into the lungs every 6 (six) hours as needed for wheezing or shortness of breath.   3 Inhaler   4   . ALPRAZolam (XANAX) 0.5 MG tablet   Oral   Take 0.25-0.5 mg by mouth 3 (three) times daily as needed for sleep or anxiety.         . Calcium Carbonate-Vitamin D (CALCIUM  600+D) 600-400 MG-UNIT per tablet   Oral   Take 1 tablet by mouth 2 (two) times daily.           . diclofenac-misoprostol (ARTHROTEC 75) 75-0.2 MG per tablet   Oral   Take 1 tablet by mouth 2 (two) times daily.           Marland Kitchen exenatide (BYETTA 10 MCG PEN) 10 MCG/0.04ML SOLN   Subcutaneous   Inject 10 mcg into the skin daily with supper.          . fexofenadine (ALLEGRA) 180 MG tablet   Oral   Take 180 mg by mouth daily.           . fish oil-omega-3 fatty acids 1000 MG capsule   Oral   Take 2 g by mouth every evening.          . folic acid (FOLVITE) 1 MG tablet   Oral   Take 1 mg by mouth daily.         . furosemide (LASIX) 40 MG tablet   Oral   Take 40 mg by mouth daily as needed for fluid. Fluid         . gabapentin (NEURONTIN) 600 MG tablet   Oral   Take 600 mg by mouth at bedtime.         . insulin aspart (NOVOLOG) 100 UNIT/ML injection   Subcutaneous   Inject 10-15 Units into the skin 3 (three) times daily before meals. Inject 10Units subcutaneously every morning, 15 Units subcutaneously at lunchtime and 10 Units subcutaneously at bedtime as directed         . insulin glargine (LANTUS) 100 UNIT/ML injection   Subcutaneous   Inject 80-100 Units into the skin 2 (two) times daily. Per patient 80 in am and 100 in pm.         . losartan (COZAAR) 100 MG tablet   Oral   Take 100 mg by mouth daily.         . metFORMIN (GLUCOPHAGE-XR) 500 MG 24 hr tablet   Oral   Take 2,000 mg by mouth daily.           . methocarbamol (ROBAXIN) 750 MG tablet   Oral   Take 750 mg by mouth every 8 (eight) hours as needed. Muscle Spasms         . Multiple Vitamins-Minerals (ICAPS MV PO)   Oral   Take 1 tablet by mouth 2 (two) times daily.         Marland Kitchen  omeprazole (PRILOSEC) 40 MG capsule   Oral   Take 40 mg by mouth daily.         . sertraline (ZOLOFT) 100 MG tablet   Oral   Take 100 mg by mouth daily.           . simvastatin (ZOCOR) 40 MG tablet    Oral   Take 40 mg by mouth daily with supper.          . tiotropium (SPIRIVA) 18 MCG inhalation capsule   Inhalation   Place 18 mcg into inhaler and inhale daily.           . Abatacept (ORENCIA [place])   Subcutaneous   Inject into the skin. 1 injection weekly         . ibuprofen (ADVIL,MOTRIN) 200 MG tablet   Oral   Take 400-600 mg by mouth every 8 (eight) hours as needed. Pain         . methotrexate (RHEUMATREX) 2.5 MG tablet   Oral   Take 20 mg by mouth once a week. Caution:Chemotherapy. Protect from light.          Triage Vitals: BP 113/50  Pulse 95  Temp(Src) 98.4 F (36.9 C) (Oral)  Resp 22  Ht 5\' 2"  (1.575 m)  Wt 290 lb (131.543 kg)  BMI 53.03 kg/m2  SpO2 98%  Physical Exam  Nursing note and vitals reviewed. Constitutional: She is oriented to person, place, and time. No distress.  Morbidly obese   HENT:  Head: Normocephalic and atraumatic.  Eyes: Conjunctivae and EOM are normal. Pupils are equal, round, and reactive to light.  Neck: Normal range of motion and phonation normal. Neck supple.  Cardiovascular: Normal rate, regular rhythm and intact distal pulses.   Pulmonary/Chest: Effort normal and breath sounds normal. She exhibits no tenderness.  Abdominal: Soft. She exhibits no distension. There is no tenderness. There is no guarding.  Very large abdominal wall hernia that is reproducible and non-tender  Musculoskeletal: Normal range of motion.  Mild lower cervical tenderness along the midline, no thoracic or lumbar tenderness  Neurological: She is alert and oriented to person, place, and time. She has normal strength. She exhibits normal muscle tone.  Skin: Skin is warm and dry.  Psychiatric: She has a normal mood and affect. Her behavior is normal. Judgment and thought content normal.    ED Course  Procedures (including critical care time)  Medications  0.9 %  sodium chloride infusion ( Intravenous Stopped 12/31/12 1857)  sodium chloride 0.9 % bolus  1,000 mL (0 mLs Intravenous Stopped 12/31/12 1746)    Patient Vitals for the past 24 hrs:  BP Temp Temp src Pulse Resp SpO2 Height Weight  12/31/12 1849 121/60 mmHg - - 77 20 96 % - -  12/31/12 1641 110/70 mmHg - - 92 - - - -  12/31/12 1640 105/70 mmHg - - 90 - - - -  12/31/12 1639 116/70 mmHg - - 87 - - - -  12/31/12 1308 113/50 mmHg 98.4 F (36.9 C) Oral 95 22 98 % 5\' 2"  (1.575 m) 290 lb (131.543 kg)     DIAGNOSTIC STUDIES: Oxygen Saturation is 98% on room air, normal by my interpretation.    COORDINATION OF CARE: 2:53 PM-Discussed treatment plan which includes CBC panel, CMP and UA with pt at bedside and pt agreed to plan. Pt is requesting something to drink.   6:18 PM Reevaluation with update and discussion. After initial assessment and treatment, an updated  evaluation reveals she is comfortable, no further complaints, no dizziness wle ambulated in the emergency department. Annaelle Kasel L      Date: 12/31/12  Rate: 79  Rhythm: normal sinus rhythm  QRS Axis: normal  PR and QT Intervals: normal  ST/T Wave abnormalities: normal  PR and QRS Conduction Disutrbances:none  Narrative Interpretation:   Old EKG Reviewed: unchanged- 05/29/09    Labs Review Labs Reviewed  URINALYSIS, ROUTINE W REFLEX MICROSCOPIC - Abnormal; Notable for the following:    Leukocytes, UA TRACE (*)    All other components within normal limits  URINE MICROSCOPIC-ADD ON - Abnormal; Notable for the following:    Squamous Epithelial / LPF FEW (*)    All other components within normal limits  CBC WITH DIFFERENTIAL - Abnormal; Notable for the following:    RDW 15.8 (*)    Neutrophils Relative % 79 (*)    All other components within normal limits  BASIC METABOLIC PANEL - Abnormal; Notable for the following:    CO2 33 (*)    Glucose, Bld 124 (*)    GFR calc non Af Amer 74 (*)    GFR calc Af Amer 86 (*)    All other components within normal limits  URINE CULTURE   Imaging Review Dg Chest 2  View  12/31/2012   CLINICAL DATA:  Fall and dizziness  EXAM: CHEST  2 VIEW  COMPARISON:  Chest radiograph 08/06/2009  FINDINGS: Heart, mediastinal, and hilar contours are stable. Pulmonary vascularity is within normal limits. Mild right basilar atelectasis. No definite pleural effusion. Negative for pneumothorax. No acute osseous abnormality identified.  IMPRESSION: Mild right basilar atelectasis.   Electronically Signed   By: Britta Mccreedy   On: 12/31/2012 16:28   Ct Head Wo Contrast  12/31/2012   CLINICAL DATA:  Fall today. Dizziness.  EXAM: CT HEAD WITHOUT CONTRAST  CT CERVICAL SPINE WITHOUT CONTRAST  TECHNIQUE: Multidetector CT imaging of the head and cervical spine was performed following the standard protocol without intravenous contrast. Multiplanar CT image reconstructions of the cervical spine were also generated.  COMPARISON:  None.  FINDINGS: CT HEAD FINDINGS  There is no evidence for hemorrhage, mass lesion, or acute infarction. Bone windows are unremarkable.  CT CERVICAL SPINE FINDINGS  There are degenerative changes throughout the cervical spine. No evidence for acute fracture or subluxation.  IMPRESSION: CT HEAD IMPRESSION  No evidence for acute intracranial abnormality.  CT CERVICAL SPINE IMPRESSION  No evidence for acute cervical spine abnormality.   Electronically Signed   By: Rosalie Gums M.D.   On: 12/31/2012 16:47    MDM   1. Syncope    Apparent syncope without injury or apparent provocation. Screening evaluation is negative. She is at low risk patient for recurrence in stable for discharge. She has access to followup care with her primary care doctor.  Nursing Notes Reviewed/ Care Coordinated, and agree without changes. Applicable Imaging Reviewed.  Interpretation of Laboratory Data incorporated into ED treatment   Plan: Home Medications- usual; Home Treatments and Observation- rest, fluids, watch for progressive symptoms; return here if the recommended treatment, does not  improve the symptoms; Recommended follow up- PCP, for check up in 2 or 3 days   I personally performed the services described in this documentation, which was scribed in my presence. The recorded information has been reviewed and is accurate.      Flint Melter, MD 12/31/12 [phone]

## 2012-12-31 NOTE — ED Notes (Signed)
Patient denies any dizziness upon sitting or standing.  Patient does have pain in "her tailbone" when attempting to sit and she states this is from "the fall".

## 2012-12-31 NOTE — ED Notes (Addendum)
Per EMS, pt reports trying to get dressed this am and fell on bottom while getting dressed. Pt reports confusion,dizziness, frequent urination, restless legs  for last several days. Pt alert and oriented upon arrival to ED. nad noted. Airway patent. En route CBG 179.

## 2013-01-01 LAB — URINE CULTURE: Culture: NO GROWTH

## 2013-01-03 ENCOUNTER — Other Ambulatory Visit: Payer: Self-pay | Admitting: Internal Medicine

## 2013-01-03 ENCOUNTER — Ambulatory Visit
Admission: RE | Admit: 2013-01-03 | Discharge: 2013-01-03 | Disposition: A | Discharge status: Home / Self Care | Payer: Medicare Other | Source: Ambulatory Visit | Attending: Internal Medicine | Admitting: Internal Medicine

## 2013-01-03 DIAGNOSIS — R55 Syncope and collapse: Secondary | ICD-10-CM

## 2013-01-03 DIAGNOSIS — S0990XA Unspecified injury of head, initial encounter: Secondary | ICD-10-CM | POA: Diagnosis not present

## 2013-01-04 DIAGNOSIS — B351 Tinea unguium: Secondary | ICD-10-CM | POA: Diagnosis not present

## 2013-01-04 DIAGNOSIS — E1149 Type 2 diabetes mellitus with other diabetic neurological complication: Secondary | ICD-10-CM | POA: Diagnosis not present

## 2013-01-04 DIAGNOSIS — L851 Acquired keratosis [keratoderma] palmaris et plantaris: Secondary | ICD-10-CM | POA: Diagnosis not present

## 2013-01-08 DIAGNOSIS — M069 Rheumatoid arthritis, unspecified: Secondary | ICD-10-CM | POA: Diagnosis not present

## 2013-01-08 DIAGNOSIS — M159 Polyosteoarthritis, unspecified: Secondary | ICD-10-CM | POA: Diagnosis not present

## 2013-01-23 DIAGNOSIS — M47817 Spondylosis without myelopathy or radiculopathy, lumbosacral region: Secondary | ICD-10-CM | POA: Diagnosis not present

## 2013-01-29 DIAGNOSIS — Z79899 Other long term (current) drug therapy: Secondary | ICD-10-CM | POA: Diagnosis not present

## 2013-01-29 DIAGNOSIS — I10 Essential (primary) hypertension: Secondary | ICD-10-CM | POA: Diagnosis not present

## 2013-01-29 DIAGNOSIS — E119 Type 2 diabetes mellitus without complications: Secondary | ICD-10-CM | POA: Diagnosis not present

## 2013-02-05 DIAGNOSIS — E119 Type 2 diabetes mellitus without complications: Secondary | ICD-10-CM | POA: Diagnosis not present

## 2013-02-05 DIAGNOSIS — E785 Hyperlipidemia, unspecified: Secondary | ICD-10-CM | POA: Diagnosis not present

## 2013-02-05 DIAGNOSIS — I10 Essential (primary) hypertension: Secondary | ICD-10-CM | POA: Diagnosis not present

## 2013-02-14 ENCOUNTER — Other Ambulatory Visit: Payer: Self-pay

## 2013-04-09 DIAGNOSIS — M069 Rheumatoid arthritis, unspecified: Secondary | ICD-10-CM | POA: Diagnosis not present

## 2013-04-09 DIAGNOSIS — M79609 Pain in unspecified limb: Secondary | ICD-10-CM | POA: Diagnosis not present

## 2013-04-09 DIAGNOSIS — M159 Polyosteoarthritis, unspecified: Secondary | ICD-10-CM | POA: Diagnosis not present

## 2013-04-10 DIAGNOSIS — M069 Rheumatoid arthritis, unspecified: Secondary | ICD-10-CM | POA: Diagnosis not present

## 2013-04-12 DIAGNOSIS — L851 Acquired keratosis [keratoderma] palmaris et plantaris: Secondary | ICD-10-CM | POA: Diagnosis not present

## 2013-04-12 DIAGNOSIS — E1149 Type 2 diabetes mellitus with other diabetic neurological complication: Secondary | ICD-10-CM | POA: Diagnosis not present

## 2013-04-12 DIAGNOSIS — B351 Tinea unguium: Secondary | ICD-10-CM | POA: Diagnosis not present

## 2013-04-18 DIAGNOSIS — J209 Acute bronchitis, unspecified: Secondary | ICD-10-CM | POA: Diagnosis not present

## 2013-05-14 DIAGNOSIS — L0291 Cutaneous abscess, unspecified: Secondary | ICD-10-CM | POA: Diagnosis not present

## 2013-05-14 DIAGNOSIS — L97509 Non-pressure chronic ulcer of other part of unspecified foot with unspecified severity: Secondary | ICD-10-CM | POA: Diagnosis not present

## 2013-05-14 DIAGNOSIS — L0390 Cellulitis, unspecified: Secondary | ICD-10-CM | POA: Diagnosis not present

## 2013-05-24 DIAGNOSIS — E1149 Type 2 diabetes mellitus with other diabetic neurological complication: Secondary | ICD-10-CM | POA: Diagnosis not present

## 2013-05-24 DIAGNOSIS — L97509 Non-pressure chronic ulcer of other part of unspecified foot with unspecified severity: Secondary | ICD-10-CM | POA: Diagnosis not present

## 2013-06-05 ENCOUNTER — Ambulatory Visit (INDEPENDENT_AMBULATORY_CARE_PROVIDER_SITE_OTHER): Payer: Medicare Other | Admitting: Pulmonary Disease

## 2013-06-05 ENCOUNTER — Encounter: Payer: Self-pay | Admitting: Pulmonary Disease

## 2013-06-05 VITALS — BP 132/68 | HR 113 | Temp 98.0°F | Ht 62.0 in | Wt 287.0 lb

## 2013-06-05 DIAGNOSIS — G4733 Obstructive sleep apnea (adult) (pediatric): Secondary | ICD-10-CM

## 2013-06-05 DIAGNOSIS — J438 Other emphysema: Secondary | ICD-10-CM | POA: Diagnosis not present

## 2013-06-05 DIAGNOSIS — J439 Emphysema, unspecified: Secondary | ICD-10-CM

## 2013-06-05 NOTE — Progress Notes (Signed)
   Subjective:    Patient ID: [name], female    DOB: Mar 09, 1951, 63 y.o.   MRN: [id number]  HPI The patient comes in today for followup of her known COPD and obstructive sleep apnea. She is wearing CPAP compliantly, and her only issue is with her mask and headgear. They are aged, and she is having leaking.  With respect to her COPD, the patient feels that her breathing is at her usual baseline. She continues to have significant dyspnea on exertion. She denies any cough, chest congestion, or purulent mucus. She did have a recent sinus infection with wheezing, and was treated by her primary physician with an antibiotic and a course of prednisone.   Review of Systems  Constitutional: Negative for fever and unexpected weight change.  HENT: Negative for congestion, dental problem, ear pain, nosebleeds, postnasal drip, rhinorrhea, sinus pressure, sneezing, sore throat and trouble swallowing.   Eyes: Negative for redness and itching.  Respiratory: Positive for shortness of breath. Negative for cough, chest tightness and wheezing.   Cardiovascular: Negative for palpitations and leg swelling.  Gastrointestinal: Negative for nausea and vomiting.  Genitourinary: Negative for dysuria.  Musculoskeletal: Negative for joint swelling.  Skin: Negative for rash.  Neurological: Negative for headaches.  Hematological: Does not bruise/bleed easily.  Psychiatric/Behavioral: Negative for dysphoric mood. The patient is not nervous/anxious.        Objective:   Physical Exam Morbidly obese female in no acute distress Nose without purulence or discharge noted No skin breakdown or pressure necrosis from the CPAP mask Neck without lymphadenopathy or thyromegaly Chest with mildly decreased breath sounds, no wheezes or crackles Cardiac exam with regular rate and rhythm Lower extremities with 1+ edema on the left and 2+ on the right Alert and oriented, does not appear to be sleepy, moves all 4  extremities.       Assessment & Plan:

## 2013-06-05 NOTE — Assessment & Plan Note (Signed)
The patient continues to have significant dyspnea on exertion, but feels it is near her usual baseline. She is continuing on her bronchodilator regimen, and currently does not have any evidence for bronchospasm or a pulmonary infection. I would like to try her on a different delivery device with a different particles size instead of her Advair HFA, and the patient is agreeable. I've given her samples of breo to try.  I have also stressed to her the importance of aggressive weight loss.

## 2013-06-05 NOTE — Assessment & Plan Note (Signed)
The patient is wearing CPAP compliantly, and feels that she continues to benefit from treatment. She is due for a new mask and headgear, and we'll send an order to her home care company. I've also encouraged her to work aggressively on weight loss.

## 2013-06-05 NOTE — Patient Instructions (Signed)
Continue spiriva Hold your advair HFA for now, and start on breo samples one inhalation each am (once a day only).  Rinse mouth well after using.  Will send an order to [facility] to get you new mask and headgear. Work on weight loss followup with me again in 98mos.

## 2013-06-07 DIAGNOSIS — Z79899 Other long term (current) drug therapy: Secondary | ICD-10-CM | POA: Diagnosis not present

## 2013-06-07 DIAGNOSIS — E119 Type 2 diabetes mellitus without complications: Secondary | ICD-10-CM | POA: Diagnosis not present

## 2013-06-07 DIAGNOSIS — I10 Essential (primary) hypertension: Secondary | ICD-10-CM | POA: Diagnosis not present

## 2013-06-11 DIAGNOSIS — L97509 Non-pressure chronic ulcer of other part of unspecified foot with unspecified severity: Secondary | ICD-10-CM | POA: Diagnosis not present

## 2013-06-14 DIAGNOSIS — F411 Generalized anxiety disorder: Secondary | ICD-10-CM | POA: Diagnosis not present

## 2013-06-14 DIAGNOSIS — E1149 Type 2 diabetes mellitus with other diabetic neurological complication: Secondary | ICD-10-CM | POA: Diagnosis not present

## 2013-06-14 DIAGNOSIS — I10 Essential (primary) hypertension: Secondary | ICD-10-CM | POA: Diagnosis not present

## 2013-06-20 DIAGNOSIS — M25569 Pain in unspecified knee: Secondary | ICD-10-CM | POA: Diagnosis not present

## 2013-06-20 DIAGNOSIS — M25519 Pain in unspecified shoulder: Secondary | ICD-10-CM | POA: Diagnosis not present

## 2013-06-20 DIAGNOSIS — M25819 Other specified joint disorders, unspecified shoulder: Secondary | ICD-10-CM | POA: Diagnosis not present

## 2013-07-09 DIAGNOSIS — M79609 Pain in unspecified limb: Secondary | ICD-10-CM | POA: Diagnosis not present

## 2013-07-09 DIAGNOSIS — M069 Rheumatoid arthritis, unspecified: Secondary | ICD-10-CM | POA: Diagnosis not present

## 2013-07-09 DIAGNOSIS — M159 Polyosteoarthritis, unspecified: Secondary | ICD-10-CM | POA: Diagnosis not present

## 2013-07-12 DIAGNOSIS — L97509 Non-pressure chronic ulcer of other part of unspecified foot with unspecified severity: Secondary | ICD-10-CM | POA: Diagnosis not present

## 2013-07-12 DIAGNOSIS — B351 Tinea unguium: Secondary | ICD-10-CM | POA: Diagnosis not present

## 2013-07-12 DIAGNOSIS — L851 Acquired keratosis [keratoderma] palmaris et plantaris: Secondary | ICD-10-CM | POA: Diagnosis not present

## 2013-07-22 DIAGNOSIS — IMO0002 Reserved for concepts with insufficient information to code with codable children: Secondary | ICD-10-CM | POA: Diagnosis not present

## 2013-07-23 ENCOUNTER — Other Ambulatory Visit: Payer: Self-pay | Admitting: Pulmonary Disease

## 2013-07-25 DIAGNOSIS — IMO0002 Reserved for concepts with insufficient information to code with codable children: Secondary | ICD-10-CM | POA: Diagnosis not present

## 2013-08-02 DIAGNOSIS — E1149 Type 2 diabetes mellitus with other diabetic neurological complication: Secondary | ICD-10-CM | POA: Diagnosis not present

## 2013-09-13 DIAGNOSIS — Z79899 Other long term (current) drug therapy: Secondary | ICD-10-CM | POA: Diagnosis not present

## 2013-09-13 DIAGNOSIS — E119 Type 2 diabetes mellitus without complications: Secondary | ICD-10-CM | POA: Diagnosis not present

## 2013-09-13 DIAGNOSIS — I10 Essential (primary) hypertension: Secondary | ICD-10-CM | POA: Diagnosis not present

## 2013-09-17 DIAGNOSIS — M5412 Radiculopathy, cervical region: Secondary | ICD-10-CM | POA: Diagnosis not present

## 2013-09-20 DIAGNOSIS — B351 Tinea unguium: Secondary | ICD-10-CM | POA: Diagnosis not present

## 2013-09-20 DIAGNOSIS — E1149 Type 2 diabetes mellitus with other diabetic neurological complication: Secondary | ICD-10-CM | POA: Diagnosis not present

## 2013-09-20 DIAGNOSIS — I10 Essential (primary) hypertension: Secondary | ICD-10-CM | POA: Diagnosis not present

## 2013-09-20 DIAGNOSIS — L851 Acquired keratosis [keratoderma] palmaris et plantaris: Secondary | ICD-10-CM | POA: Diagnosis not present

## 2013-09-25 ENCOUNTER — Other Ambulatory Visit: Payer: Self-pay

## 2013-09-25 DIAGNOSIS — Z1231 Encounter for screening mammogram for malignant neoplasm of breast: Secondary | ICD-10-CM

## 2013-10-03 ENCOUNTER — Other Ambulatory Visit: Payer: Self-pay | Admitting: Pulmonary Disease

## 2013-10-08 DIAGNOSIS — M79609 Pain in unspecified limb: Secondary | ICD-10-CM | POA: Diagnosis not present

## 2013-10-08 DIAGNOSIS — M159 Polyosteoarthritis, unspecified: Secondary | ICD-10-CM | POA: Diagnosis not present

## 2013-10-08 DIAGNOSIS — M069 Rheumatoid arthritis, unspecified: Secondary | ICD-10-CM | POA: Diagnosis not present

## 2013-10-15 ENCOUNTER — Ambulatory Visit
Admission: RE | Admit: 2013-10-15 | Discharge: 2013-10-15 | Disposition: A | Discharge status: Home / Self Care | Payer: Medicare Other | Source: Ambulatory Visit

## 2013-10-15 DIAGNOSIS — Z1231 Encounter for screening mammogram for malignant neoplasm of breast: Secondary | ICD-10-CM

## 2013-10-30 DIAGNOSIS — H5210 Myopia, unspecified eye: Secondary | ICD-10-CM | POA: Diagnosis not present

## 2013-10-30 DIAGNOSIS — H353190 Nonexudative age-related macular degeneration, unspecified eye, stage unspecified: Secondary | ICD-10-CM | POA: Diagnosis not present

## 2013-10-30 DIAGNOSIS — H5010 Unspecified exotropia: Secondary | ICD-10-CM | POA: Diagnosis not present

## 2013-10-30 DIAGNOSIS — H02829 Cysts of unspecified eye, unspecified eyelid: Secondary | ICD-10-CM | POA: Diagnosis not present

## 2013-11-15 DIAGNOSIS — E1149 Type 2 diabetes mellitus with other diabetic neurological complication: Secondary | ICD-10-CM | POA: Diagnosis not present

## 2013-12-04 ENCOUNTER — Ambulatory Visit: Payer: Medicare Other | Admitting: Pulmonary Disease

## 2013-12-09 DIAGNOSIS — H43819 Vitreous degeneration, unspecified eye: Secondary | ICD-10-CM | POA: Diagnosis not present

## 2013-12-09 DIAGNOSIS — H43399 Other vitreous opacities, unspecified eye: Secondary | ICD-10-CM | POA: Diagnosis not present

## 2013-12-10 ENCOUNTER — Encounter: Payer: Self-pay | Admitting: Pulmonary Disease

## 2013-12-10 ENCOUNTER — Ambulatory Visit (INDEPENDENT_AMBULATORY_CARE_PROVIDER_SITE_OTHER): Payer: Medicare Other | Admitting: Pulmonary Disease

## 2013-12-10 VITALS — BP 130/62 | HR 83 | Temp 98.6°F | Ht 62.0 in | Wt 280.4 lb

## 2013-12-10 DIAGNOSIS — Z23 Encounter for immunization: Secondary | ICD-10-CM

## 2013-12-10 DIAGNOSIS — J438 Other emphysema: Secondary | ICD-10-CM

## 2013-12-10 DIAGNOSIS — D239 Other benign neoplasm of skin, unspecified: Secondary | ICD-10-CM | POA: Diagnosis not present

## 2013-12-10 DIAGNOSIS — L821 Other seborrheic keratosis: Secondary | ICD-10-CM | POA: Diagnosis not present

## 2013-12-10 DIAGNOSIS — G4733 Obstructive sleep apnea (adult) (pediatric): Secondary | ICD-10-CM | POA: Diagnosis not present

## 2013-12-10 DIAGNOSIS — J439 Emphysema, unspecified: Secondary | ICD-10-CM

## 2013-12-10 NOTE — Patient Instructions (Signed)
Will give you a flu shot today. Will have your homecare company get a download off your machine to check on things.  If you do not hear from [place] within one week after the download is obtained, let us know. Keep working on weight loss, you are making progress. Continue your copd medications.  followup with me again in 69mos.

## 2013-12-10 NOTE — Assessment & Plan Note (Signed)
The patient is wearing her CPAP compliantly with a new supplies, but does not feel that she is sleeping well. More than likely this is related to something else, but we'll get a download off her device to make sure everything is working properly.

## 2013-12-10 NOTE — Progress Notes (Signed)
   Subjective:    Patient ID: [name], female    DOB: 01/11/1951, 63 y.o.   MRN: [id number]  HPI The patient comes in today for followup of her obstructive sleep apnea and known COPD. She was tried on breo at the last visit, but saw no difference in her breathing and therefore went back to Advair. She feels that her dyspnea on exertion is at her usual baseline, and has not had a recent acute exacerbation. She denies any significant cough or congestion. She is wearing her CPAP compliantly, but does not feel that she is sleeping well during the night. She does not think that she is having excessive mask leaks.   Review of Systems  Constitutional: Negative for fever and unexpected weight change.  HENT: Negative for congestion, dental problem, ear pain, nosebleeds, postnasal drip, rhinorrhea, sinus pressure, sneezing, sore throat and trouble swallowing.   Eyes: Negative for redness and itching.  Respiratory: Positive for shortness of breath. Negative for cough, chest tightness and wheezing.   Cardiovascular: Negative for palpitations and leg swelling.  Gastrointestinal: Negative for nausea and vomiting.  Genitourinary: Negative for dysuria.  Musculoskeletal: Negative for joint swelling.  Skin: Negative for rash.  Neurological: Negative for headaches.  Hematological: Does not bruise/bleed easily.  Psychiatric/Behavioral: Negative for dysphoric mood. The patient is not nervous/anxious.        Objective:   Physical Exam Morbidly obese female in no acute distress Nose without purulence or discharge noted Neck without lymphadenopathy or thyromegaly No skin breakdown or pressure necrosis from the CPAP mask Chest with mildly decreased breath sounds, but adequate airflow and no wheezing Cardiac exam with regular rate and rhythm Lower extremities with 1+ edema, no cyanosis Alert and oriented, moves all 4 extremities.       Assessment & Plan:

## 2013-12-10 NOTE — Assessment & Plan Note (Signed)
The patient appears to be stable on her current bronchodilator regimen. I have stressed to her the importance of aggressive weight loss, and maintaining her conditioning.

## 2013-12-13 DIAGNOSIS — B351 Tinea unguium: Secondary | ICD-10-CM | POA: Diagnosis not present

## 2013-12-13 DIAGNOSIS — E1149 Type 2 diabetes mellitus with other diabetic neurological complication: Secondary | ICD-10-CM | POA: Diagnosis not present

## 2013-12-13 DIAGNOSIS — E119 Type 2 diabetes mellitus without complications: Secondary | ICD-10-CM | POA: Diagnosis not present

## 2013-12-13 DIAGNOSIS — L851 Acquired keratosis [keratoderma] palmaris et plantaris: Secondary | ICD-10-CM | POA: Diagnosis not present

## 2013-12-23 DIAGNOSIS — E1149 Type 2 diabetes mellitus with other diabetic neurological complication: Secondary | ICD-10-CM | POA: Diagnosis not present

## 2013-12-23 DIAGNOSIS — M069 Rheumatoid arthritis, unspecified: Secondary | ICD-10-CM | POA: Diagnosis not present

## 2013-12-23 DIAGNOSIS — K439 Ventral hernia without obstruction or gangrene: Secondary | ICD-10-CM | POA: Diagnosis not present

## 2013-12-31 DIAGNOSIS — M25559 Pain in unspecified hip: Secondary | ICD-10-CM | POA: Diagnosis not present

## 2013-12-31 DIAGNOSIS — M159 Polyosteoarthritis, unspecified: Secondary | ICD-10-CM | POA: Diagnosis not present

## 2013-12-31 DIAGNOSIS — M069 Rheumatoid arthritis, unspecified: Secondary | ICD-10-CM | POA: Diagnosis not present

## 2013-12-31 DIAGNOSIS — M25549 Pain in joints of unspecified hand: Secondary | ICD-10-CM | POA: Diagnosis not present

## 2013-12-31 DIAGNOSIS — M1610 Unilateral primary osteoarthritis, unspecified hip: Secondary | ICD-10-CM | POA: Diagnosis not present

## 2013-12-31 DIAGNOSIS — M169 Osteoarthritis of hip, unspecified: Secondary | ICD-10-CM | POA: Diagnosis not present

## 2014-01-23 DIAGNOSIS — M0579 Rheumatoid arthritis with rheumatoid factor of multiple sites without organ or systems involvement: Secondary | ICD-10-CM | POA: Diagnosis not present

## 2014-02-17 DIAGNOSIS — M0589 Other rheumatoid arthritis with rheumatoid factor of multiple sites: Secondary | ICD-10-CM | POA: Diagnosis not present

## 2014-02-21 DIAGNOSIS — L851 Acquired keratosis [keratoderma] palmaris et plantaris: Secondary | ICD-10-CM | POA: Diagnosis not present

## 2014-02-21 DIAGNOSIS — B351 Tinea unguium: Secondary | ICD-10-CM | POA: Diagnosis not present

## 2014-02-21 DIAGNOSIS — E1342 Other specified diabetes mellitus with diabetic polyneuropathy: Secondary | ICD-10-CM | POA: Diagnosis not present

## 2014-03-10 ENCOUNTER — Other Ambulatory Visit: Payer: Self-pay | Admitting: Pulmonary Disease

## 2014-03-17 DIAGNOSIS — E119 Type 2 diabetes mellitus without complications: Secondary | ICD-10-CM | POA: Diagnosis not present

## 2014-03-18 DIAGNOSIS — M0589 Other rheumatoid arthritis with rheumatoid factor of multiple sites: Secondary | ICD-10-CM | POA: Diagnosis not present

## 2014-03-19 DIAGNOSIS — M150 Primary generalized (osteo)arthritis: Secondary | ICD-10-CM | POA: Diagnosis not present

## 2014-03-19 DIAGNOSIS — M0589 Other rheumatoid arthritis with rheumatoid factor of multiple sites: Secondary | ICD-10-CM | POA: Diagnosis not present

## 2014-03-19 DIAGNOSIS — J069 Acute upper respiratory infection, unspecified: Secondary | ICD-10-CM | POA: Diagnosis not present

## 2014-03-24 DIAGNOSIS — I10 Essential (primary) hypertension: Secondary | ICD-10-CM | POA: Diagnosis not present

## 2014-03-24 DIAGNOSIS — E119 Type 2 diabetes mellitus without complications: Secondary | ICD-10-CM | POA: Diagnosis not present

## 2014-03-24 DIAGNOSIS — J209 Acute bronchitis, unspecified: Secondary | ICD-10-CM | POA: Diagnosis not present

## 2014-04-28 ENCOUNTER — Other Ambulatory Visit (HOSPITAL_COMMUNITY): Payer: Self-pay | Admitting: Internal Medicine

## 2014-04-28 ENCOUNTER — Ambulatory Visit (HOSPITAL_COMMUNITY)
Admission: RE | Admit: 2014-04-28 | Discharge: 2014-04-28 | Disposition: A | Discharge status: Home / Self Care | Payer: Medicare Other | Source: Ambulatory Visit | Attending: Internal Medicine | Admitting: Internal Medicine

## 2014-04-28 DIAGNOSIS — M47814 Spondylosis without myelopathy or radiculopathy, thoracic region: Secondary | ICD-10-CM | POA: Diagnosis not present

## 2014-04-28 DIAGNOSIS — M549 Dorsalgia, unspecified: Secondary | ICD-10-CM

## 2014-04-28 DIAGNOSIS — M546 Pain in thoracic spine: Secondary | ICD-10-CM | POA: Insufficient documentation

## 2014-05-13 DIAGNOSIS — E1342 Other specified diabetes mellitus with diabetic polyneuropathy: Secondary | ICD-10-CM | POA: Diagnosis not present

## 2014-05-13 DIAGNOSIS — L851 Acquired keratosis [keratoderma] palmaris et plantaris: Secondary | ICD-10-CM | POA: Diagnosis not present

## 2014-05-13 DIAGNOSIS — B351 Tinea unguium: Secondary | ICD-10-CM | POA: Diagnosis not present

## 2014-05-14 ENCOUNTER — Other Ambulatory Visit: Payer: Self-pay | Admitting: Pulmonary Disease

## 2014-05-14 MED ORDER — FLUTICASONE-SALMETEROL 115-21 MCG/ACT IN AERO: INHALATION_SPRAY | RESPIRATORY_TRACT | Status: DC

## 2014-05-15 DIAGNOSIS — M0589 Other rheumatoid arthritis with rheumatoid factor of multiple sites: Secondary | ICD-10-CM | POA: Diagnosis not present

## 2014-05-25 ENCOUNTER — Other Ambulatory Visit: Payer: Self-pay | Admitting: Pulmonary Disease

## 2014-06-10 ENCOUNTER — Ambulatory Visit (INDEPENDENT_AMBULATORY_CARE_PROVIDER_SITE_OTHER): Payer: Medicare Other | Admitting: Pulmonary Disease

## 2014-06-10 ENCOUNTER — Encounter: Payer: Self-pay | Admitting: Pulmonary Disease

## 2014-06-10 VITALS — BP 126/70 | HR 80 | Temp 97.0°F | Ht 62.0 in | Wt 284.2 lb

## 2014-06-10 DIAGNOSIS — J438 Other emphysema: Secondary | ICD-10-CM

## 2014-06-10 DIAGNOSIS — G4733 Obstructive sleep apnea (adult) (pediatric): Secondary | ICD-10-CM | POA: Diagnosis not present

## 2014-06-10 NOTE — Assessment & Plan Note (Signed)
The patient feels that her breathing is near her usual baseline, and she has not had a recent pulmonary infection or acute exacerbation. I have asked her to stay on her current bronchodilator regimen.

## 2014-06-10 NOTE — Progress Notes (Signed)
   Subjective:    Patient ID: [name], female    DOB: [birth year], 63 y.o.   MRN: [id number]  HPI The patient comes in today for follow-up of her known COPD and obstructive sleep apnea. She is staying on her bronchodilator regimen, and has not had an acute exacerbation since last visit. He feels that her breathing is near baseline, with no significant cough or mucus. The patient is staying on her C Pap device, but is having issues with its function. It is cutting off during the middle of the night, and is due for a replacement.   Review of Systems  Constitutional: Negative for fever and unexpected weight change.  HENT: Positive for congestion. Negative for dental problem, ear pain, nosebleeds, postnasal drip, rhinorrhea, sinus pressure, sneezing, sore throat and trouble swallowing.   Eyes: Negative for redness and itching.  Respiratory: Negative for cough, chest tightness, shortness of breath and wheezing.   Cardiovascular: Negative for palpitations and leg swelling.  Gastrointestinal: Negative for nausea and vomiting.  Genitourinary: Negative for dysuria.  Musculoskeletal: Negative for joint swelling.  Skin: Negative for rash.  Neurological: Negative for headaches.  Hematological: Does not bruise/bleed easily.  Psychiatric/Behavioral: Negative for dysphoric mood. The patient is not nervous/anxious.        Objective:   Physical Exam Morbidly obese female in no acute distress Nose without purulence or discharge noted No skin breakdown or pressure necrosis from the C Pap mask Neck without lymphadenopathy or thyromegaly Chest totally clear to auscultation, no wheezing Cardiac exam with regular rate and rhythm Lower extremities with edema noted, no cyanosis Alert and oriented, moves all 4 extremities.       Assessment & Plan:

## 2014-06-10 NOTE — Patient Instructions (Signed)
No change in breathing medications Stay on cpap, and will send an order to get you a new machine. Work on weight loss followup with me again in 40mos.

## 2014-06-10 NOTE — Assessment & Plan Note (Signed)
The patient is wearing C Pap compliantly, and feels overall that she is doing well with her device. It has been malfunctioning, by cutting off during the middle of the night, and it is due to be replaced. I will send an order to her home care company for a new device. Finally, I have encouraged her to work aggressively on weight loss.

## 2014-06-18 DIAGNOSIS — M0589 Other rheumatoid arthritis with rheumatoid factor of multiple sites: Secondary | ICD-10-CM | POA: Diagnosis not present

## 2014-06-18 DIAGNOSIS — M150 Primary generalized (osteo)arthritis: Secondary | ICD-10-CM | POA: Diagnosis not present

## 2014-06-20 DIAGNOSIS — E119 Type 2 diabetes mellitus without complications: Secondary | ICD-10-CM | POA: Diagnosis not present

## 2014-06-20 DIAGNOSIS — M1990 Unspecified osteoarthritis, unspecified site: Secondary | ICD-10-CM | POA: Diagnosis not present

## 2014-06-20 DIAGNOSIS — M069 Rheumatoid arthritis, unspecified: Secondary | ICD-10-CM | POA: Diagnosis not present

## 2014-06-20 DIAGNOSIS — E785 Hyperlipidemia, unspecified: Secondary | ICD-10-CM | POA: Diagnosis not present

## 2014-06-20 DIAGNOSIS — J45909 Unspecified asthma, uncomplicated: Secondary | ICD-10-CM | POA: Diagnosis not present

## 2014-06-27 DIAGNOSIS — E1149 Type 2 diabetes mellitus with other diabetic neurological complication: Secondary | ICD-10-CM | POA: Diagnosis not present

## 2014-06-27 DIAGNOSIS — Z6841 Body Mass Index (BMI) 40.0 and over, adult: Secondary | ICD-10-CM | POA: Diagnosis not present

## 2014-06-27 DIAGNOSIS — E785 Hyperlipidemia, unspecified: Secondary | ICD-10-CM | POA: Diagnosis not present

## 2014-06-27 DIAGNOSIS — L97519 Non-pressure chronic ulcer of other part of right foot with unspecified severity: Secondary | ICD-10-CM | POA: Diagnosis not present

## 2014-06-30 DIAGNOSIS — M5412 Radiculopathy, cervical region: Secondary | ICD-10-CM | POA: Diagnosis not present

## 2014-07-08 DIAGNOSIS — L97512 Non-pressure chronic ulcer of other part of right foot with fat layer exposed: Secondary | ICD-10-CM | POA: Diagnosis not present

## 2014-07-08 DIAGNOSIS — E1342 Other specified diabetes mellitus with diabetic polyneuropathy: Secondary | ICD-10-CM | POA: Diagnosis not present

## 2014-07-08 DIAGNOSIS — L97511 Non-pressure chronic ulcer of other part of right foot limited to breakdown of skin: Secondary | ICD-10-CM | POA: Diagnosis not present

## 2014-07-15 DIAGNOSIS — E1342 Other specified diabetes mellitus with diabetic polyneuropathy: Secondary | ICD-10-CM | POA: Diagnosis not present

## 2014-07-15 DIAGNOSIS — L97511 Non-pressure chronic ulcer of other part of right foot limited to breakdown of skin: Secondary | ICD-10-CM | POA: Diagnosis not present

## 2014-07-17 DIAGNOSIS — M5412 Radiculopathy, cervical region: Secondary | ICD-10-CM | POA: Diagnosis not present

## 2014-07-22 DIAGNOSIS — L97511 Non-pressure chronic ulcer of other part of right foot limited to breakdown of skin: Secondary | ICD-10-CM | POA: Diagnosis not present

## 2014-07-22 DIAGNOSIS — E1342 Other specified diabetes mellitus with diabetic polyneuropathy: Secondary | ICD-10-CM | POA: Diagnosis not present

## 2014-07-24 DIAGNOSIS — M0589 Other rheumatoid arthritis with rheumatoid factor of multiple sites: Secondary | ICD-10-CM | POA: Diagnosis not present

## 2014-07-25 DIAGNOSIS — L97511 Non-pressure chronic ulcer of other part of right foot limited to breakdown of skin: Secondary | ICD-10-CM | POA: Diagnosis not present

## 2014-07-25 DIAGNOSIS — E1342 Other specified diabetes mellitus with diabetic polyneuropathy: Secondary | ICD-10-CM | POA: Diagnosis not present

## 2014-07-25 DIAGNOSIS — L03031 Cellulitis of right toe: Secondary | ICD-10-CM | POA: Diagnosis not present

## 2014-07-25 DIAGNOSIS — L851 Acquired keratosis [keratoderma] palmaris et plantaris: Secondary | ICD-10-CM | POA: Diagnosis not present

## 2014-07-25 DIAGNOSIS — B351 Tinea unguium: Secondary | ICD-10-CM | POA: Diagnosis not present

## 2014-08-01 DIAGNOSIS — L97511 Non-pressure chronic ulcer of other part of right foot limited to breakdown of skin: Secondary | ICD-10-CM | POA: Diagnosis not present

## 2014-08-01 DIAGNOSIS — E1342 Other specified diabetes mellitus with diabetic polyneuropathy: Secondary | ICD-10-CM | POA: Diagnosis not present

## 2014-08-11 DIAGNOSIS — M5412 Radiculopathy, cervical region: Secondary | ICD-10-CM | POA: Diagnosis not present

## 2014-08-15 DIAGNOSIS — L97511 Non-pressure chronic ulcer of other part of right foot limited to breakdown of skin: Secondary | ICD-10-CM | POA: Diagnosis not present

## 2014-08-15 DIAGNOSIS — E1342 Other specified diabetes mellitus with diabetic polyneuropathy: Secondary | ICD-10-CM | POA: Diagnosis not present

## 2014-08-18 DIAGNOSIS — M5412 Radiculopathy, cervical region: Secondary | ICD-10-CM | POA: Diagnosis not present

## 2014-08-19 DIAGNOSIS — L97511 Non-pressure chronic ulcer of other part of right foot limited to breakdown of skin: Secondary | ICD-10-CM | POA: Diagnosis not present

## 2014-08-19 DIAGNOSIS — E1342 Other specified diabetes mellitus with diabetic polyneuropathy: Secondary | ICD-10-CM | POA: Diagnosis not present

## 2014-08-19 DIAGNOSIS — M2040 Other hammer toe(s) (acquired), unspecified foot: Secondary | ICD-10-CM | POA: Diagnosis not present

## 2014-08-20 DIAGNOSIS — M5412 Radiculopathy, cervical region: Secondary | ICD-10-CM | POA: Diagnosis not present

## 2014-08-25 DIAGNOSIS — M5412 Radiculopathy, cervical region: Secondary | ICD-10-CM | POA: Diagnosis not present

## 2014-08-28 ENCOUNTER — Encounter: Payer: Self-pay | Admitting: Pulmonary Disease

## 2014-08-28 ENCOUNTER — Ambulatory Visit (INDEPENDENT_AMBULATORY_CARE_PROVIDER_SITE_OTHER): Payer: Medicare Other | Admitting: Pulmonary Disease

## 2014-08-28 VITALS — BP 138/60 | HR 89 | Temp 98.0°F | Ht 62.0 in | Wt 283.0 lb

## 2014-08-28 DIAGNOSIS — G4733 Obstructive sleep apnea (adult) (pediatric): Secondary | ICD-10-CM

## 2014-08-28 NOTE — Progress Notes (Signed)
   Subjective:    Patient ID: [name], female    DOB: 05-01-1950, 64 y.o.   MRN: [id number]  HPI The patient comes in today for follow-up of her obstructive sleep apnea. She recently got a new C Pap device, and the home care company is telling me that Medicare guidelines require that she had a face-to-face visit. It is unclear if this is an accurate policy or not. She is doing very well on her device, and is having no issues with her mask fit or pressure.   Review of Systems  Constitutional: Negative for fever and unexpected weight change.  HENT: Negative for congestion, dental problem, ear pain, nosebleeds, postnasal drip, rhinorrhea, sinus pressure, sneezing, sore throat and trouble swallowing.   Eyes: Negative for redness and itching.  Respiratory: Positive for cough and shortness of breath. Negative for chest tightness and wheezing.   Cardiovascular: Negative for palpitations and leg swelling.  Gastrointestinal: Negative for nausea and vomiting.  Genitourinary: Negative for dysuria.  Musculoskeletal: Negative for joint swelling.  Skin: Negative for rash.  Neurological: Negative for headaches.  Hematological: Does not bruise/bleed easily.  Psychiatric/Behavioral: Negative for dysphoric mood. The patient is not nervous/anxious.        Objective:   Physical Exam Obese female in no acute distress Nose without purulence or discharge noted Neck without lymphadenopathy or thyromegaly No skin breakdown or pressure necrosis from the C Pap mask Lower extremities with mild edema, no cyanosis Alert and oriented, moves all 4 extremities.       Assessment & Plan:

## 2014-08-28 NOTE — Patient Instructions (Signed)
Continue on cpap, and keep up with mask changes and supplies Keep working on weight loss Keep followup appt that is scheduled and make with Dr. Halford Chessman.

## 2014-08-28 NOTE — Assessment & Plan Note (Signed)
The patient is doing very well on her C Pap device, and her download shows excellent compliance and great control of her AHI. I have encouraged her to stay on her device, and to work aggressively on weight loss.

## 2014-09-01 DIAGNOSIS — M5412 Radiculopathy, cervical region: Secondary | ICD-10-CM | POA: Diagnosis not present

## 2014-09-03 DIAGNOSIS — M5412 Radiculopathy, cervical region: Secondary | ICD-10-CM | POA: Diagnosis not present

## 2014-09-10 DIAGNOSIS — M5412 Radiculopathy, cervical region: Secondary | ICD-10-CM | POA: Diagnosis not present

## 2014-09-11 DIAGNOSIS — M5412 Radiculopathy, cervical region: Secondary | ICD-10-CM | POA: Diagnosis not present

## 2014-09-18 ENCOUNTER — Encounter: Payer: Self-pay | Admitting: Pulmonary Disease

## 2014-09-23 DIAGNOSIS — M150 Primary generalized (osteo)arthritis: Secondary | ICD-10-CM | POA: Diagnosis not present

## 2014-09-23 DIAGNOSIS — M0589 Other rheumatoid arthritis with rheumatoid factor of multiple sites: Secondary | ICD-10-CM | POA: Diagnosis not present

## 2014-09-24 DIAGNOSIS — M0589 Other rheumatoid arthritis with rheumatoid factor of multiple sites: Secondary | ICD-10-CM | POA: Diagnosis not present

## 2014-10-06 ENCOUNTER — Other Ambulatory Visit: Payer: Self-pay

## 2014-10-20 DIAGNOSIS — M5412 Radiculopathy, cervical region: Secondary | ICD-10-CM | POA: Diagnosis not present

## 2014-10-23 ENCOUNTER — Other Ambulatory Visit: Payer: Self-pay | Admitting: *Deleted

## 2014-10-23 MED ORDER — FLUTICASONE-SALMETEROL 115-21 MCG/ACT IN AERO: INHALATION_SPRAY | RESPIRATORY_TRACT | Status: DC

## 2014-10-27 DIAGNOSIS — E119 Type 2 diabetes mellitus without complications: Secondary | ICD-10-CM | POA: Diagnosis not present

## 2014-10-27 DIAGNOSIS — Z79899 Other long term (current) drug therapy: Secondary | ICD-10-CM | POA: Diagnosis not present

## 2014-10-29 ENCOUNTER — Other Ambulatory Visit: Payer: Self-pay

## 2014-10-29 DIAGNOSIS — Z1231 Encounter for screening mammogram for malignant neoplasm of breast: Secondary | ICD-10-CM

## 2014-10-31 DIAGNOSIS — M5412 Radiculopathy, cervical region: Secondary | ICD-10-CM | POA: Diagnosis not present

## 2014-11-04 DIAGNOSIS — L97509 Non-pressure chronic ulcer of other part of unspecified foot with unspecified severity: Secondary | ICD-10-CM | POA: Diagnosis not present

## 2014-11-04 DIAGNOSIS — Z6841 Body Mass Index (BMI) 40.0 and over, adult: Secondary | ICD-10-CM | POA: Diagnosis not present

## 2014-11-04 DIAGNOSIS — K589 Irritable bowel syndrome without diarrhea: Secondary | ICD-10-CM | POA: Diagnosis not present

## 2014-11-04 DIAGNOSIS — E1149 Type 2 diabetes mellitus with other diabetic neurological complication: Secondary | ICD-10-CM | POA: Diagnosis not present

## 2014-11-05 ENCOUNTER — Other Ambulatory Visit: Payer: Self-pay | Admitting: Orthopedic Surgery

## 2014-11-05 DIAGNOSIS — M5412 Radiculopathy, cervical region: Secondary | ICD-10-CM

## 2014-11-07 DIAGNOSIS — H3531 Nonexudative age-related macular degeneration: Secondary | ICD-10-CM | POA: Diagnosis not present

## 2014-11-15 ENCOUNTER — Ambulatory Visit
Admission: RE | Admit: 2014-11-15 | Discharge: 2014-11-15 | Disposition: A | Discharge status: Home / Self Care | Payer: Medicare Other | Source: Ambulatory Visit | Attending: Orthopedic Surgery | Admitting: Orthopedic Surgery

## 2014-11-15 DIAGNOSIS — M5021 Other cervical disc displacement,  high cervical region: Secondary | ICD-10-CM | POA: Diagnosis not present

## 2014-11-15 DIAGNOSIS — M47812 Spondylosis without myelopathy or radiculopathy, cervical region: Secondary | ICD-10-CM | POA: Diagnosis not present

## 2014-11-15 DIAGNOSIS — M5022 Other cervical disc displacement, mid-cervical region: Secondary | ICD-10-CM | POA: Diagnosis not present

## 2014-11-15 DIAGNOSIS — M5412 Radiculopathy, cervical region: Secondary | ICD-10-CM

## 2014-11-18 ENCOUNTER — Other Ambulatory Visit: Payer: Self-pay | Admitting: Physician Assistant

## 2014-11-18 DIAGNOSIS — D239 Other benign neoplasm of skin, unspecified: Secondary | ICD-10-CM | POA: Diagnosis not present

## 2014-11-18 DIAGNOSIS — D485 Neoplasm of uncertain behavior of skin: Secondary | ICD-10-CM | POA: Diagnosis not present

## 2014-11-18 DIAGNOSIS — M5412 Radiculopathy, cervical region: Secondary | ICD-10-CM | POA: Diagnosis not present

## 2014-11-19 DIAGNOSIS — M0589 Other rheumatoid arthritis with rheumatoid factor of multiple sites: Secondary | ICD-10-CM | POA: Diagnosis not present

## 2014-11-28 ENCOUNTER — Other Ambulatory Visit: Payer: Self-pay | Admitting: *Deleted

## 2014-11-28 MED ORDER — OMEPRAZOLE 40 MG PO CPDR: 40.0000 mg | DELAYED_RELEASE_CAPSULE | Freq: Every morning | ORAL | Status: DC

## 2014-12-03 ENCOUNTER — Ambulatory Visit: Payer: Medicare Other

## 2014-12-08 ENCOUNTER — Ambulatory Visit
Admission: RE | Admit: 2014-12-08 | Discharge: 2014-12-08 | Disposition: A | Discharge status: Home / Self Care | Payer: Medicare Other | Source: Ambulatory Visit

## 2014-12-08 DIAGNOSIS — Z1231 Encounter for screening mammogram for malignant neoplasm of breast: Secondary | ICD-10-CM

## 2014-12-12 ENCOUNTER — Ambulatory Visit: Payer: Medicare Other | Admitting: Pulmonary Disease

## 2014-12-23 DIAGNOSIS — L851 Acquired keratosis [keratoderma] palmaris et plantaris: Secondary | ICD-10-CM | POA: Diagnosis not present

## 2014-12-23 DIAGNOSIS — B351 Tinea unguium: Secondary | ICD-10-CM | POA: Diagnosis not present

## 2014-12-23 DIAGNOSIS — M2041 Other hammer toe(s) (acquired), right foot: Secondary | ICD-10-CM | POA: Diagnosis not present

## 2014-12-23 DIAGNOSIS — E1342 Other specified diabetes mellitus with diabetic polyneuropathy: Secondary | ICD-10-CM | POA: Diagnosis not present

## 2014-12-24 ENCOUNTER — Other Ambulatory Visit: Payer: Self-pay | Admitting: Orthopedic Surgery

## 2014-12-24 DIAGNOSIS — M7989 Other specified soft tissue disorders: Secondary | ICD-10-CM | POA: Diagnosis not present

## 2014-12-24 DIAGNOSIS — M150 Primary generalized (osteo)arthritis: Secondary | ICD-10-CM | POA: Diagnosis not present

## 2014-12-24 DIAGNOSIS — M79641 Pain in right hand: Secondary | ICD-10-CM | POA: Diagnosis not present

## 2014-12-24 DIAGNOSIS — M79642 Pain in left hand: Secondary | ICD-10-CM | POA: Diagnosis not present

## 2014-12-24 DIAGNOSIS — M0589 Other rheumatoid arthritis with rheumatoid factor of multiple sites: Secondary | ICD-10-CM | POA: Diagnosis not present

## 2014-12-30 ENCOUNTER — Ambulatory Visit (INDEPENDENT_AMBULATORY_CARE_PROVIDER_SITE_OTHER): Payer: Medicare Other | Admitting: Pulmonary Disease

## 2014-12-30 ENCOUNTER — Encounter: Payer: Self-pay | Admitting: Pulmonary Disease

## 2014-12-30 ENCOUNTER — Ambulatory Visit (INDEPENDENT_AMBULATORY_CARE_PROVIDER_SITE_OTHER)
Admission: RE | Admit: 2014-12-30 | Discharge: 2014-12-30 | Disposition: A | Discharge status: Home / Self Care | Payer: Medicare Other | Source: Ambulatory Visit | Attending: Pulmonary Disease | Admitting: Pulmonary Disease

## 2014-12-30 ENCOUNTER — Other Ambulatory Visit (INDEPENDENT_AMBULATORY_CARE_PROVIDER_SITE_OTHER): Payer: Medicare Other

## 2014-12-30 ENCOUNTER — Telehealth: Payer: Self-pay | Admitting: Pulmonary Disease

## 2014-12-30 VITALS — BP 118/66 | HR 85 | Temp 98.9°F | Ht 62.0 in | Wt 270.2 lb

## 2014-12-30 DIAGNOSIS — J209 Acute bronchitis, unspecified: Secondary | ICD-10-CM

## 2014-12-30 DIAGNOSIS — J449 Chronic obstructive pulmonary disease, unspecified: Secondary | ICD-10-CM

## 2014-12-30 DIAGNOSIS — R0602 Shortness of breath: Secondary | ICD-10-CM | POA: Diagnosis not present

## 2014-12-30 DIAGNOSIS — G4733 Obstructive sleep apnea (adult) (pediatric): Secondary | ICD-10-CM

## 2014-12-30 DIAGNOSIS — Z9989 Dependence on other enabling machines and devices: Secondary | ICD-10-CM

## 2014-12-30 LAB — CBC WITH DIFFERENTIAL/PLATELET
BASOS PCT: 0.5 % (ref 0.0–3.0)
Basophils Absolute: 0 10*3/uL (ref 0.0–0.1)
EOS ABS: 0.1 10*3/uL (ref 0.0–0.7)
Eosinophils Relative: 2 % (ref 0.0–5.0)
HEMATOCRIT: 39.6 % (ref 36.0–46.0)
Hemoglobin: 12.3 g/dL (ref 12.0–15.0)
LYMPHS ABS: 1.2 10*3/uL (ref 0.7–4.0)
LYMPHS PCT: 17.2 % (ref 12.0–46.0)
MCHC: 31 g/dL (ref 30.0–36.0)
MCV: 71.9 fl — AB (ref 78.0–100.0)
Monocytes Absolute: 1 10*3/uL (ref 0.1–1.0)
Monocytes Relative: 13.8 % — ABNORMAL HIGH (ref 3.0–12.0)
Neutro Abs: 4.6 10*3/uL (ref 1.4–7.7)
Neutrophils Relative %: 66.5 % (ref 43.0–77.0)
PLATELETS: 243 10*3/uL (ref 150.0–400.0)
RBC: 5.5 Mil/uL — ABNORMAL HIGH (ref 3.87–5.11)
RDW: 26.5 % — AB (ref 11.5–15.5)
WBC: 6.9 10*3/uL (ref 4.0–10.5)

## 2014-12-30 LAB — COMPREHENSIVE METABOLIC PANEL
ALT: 22 U/L (ref 0–35)
AST: 16 U/L (ref 0–37)
Albumin: 3.9 g/dL (ref 3.5–5.2)
Alkaline Phosphatase: 78 U/L (ref 39–117)
BUN: 19 mg/dL (ref 6–23)
CALCIUM: 9.6 mg/dL (ref 8.4–10.5)
CHLORIDE: 101 meq/L (ref 96–112)
CO2: 29 meq/L (ref 19–32)
Creatinine, Ser: 1 mg/dL (ref 0.40–1.20)
GFR: 59.29 mL/min — ABNORMAL LOW (ref >60.00)
Glucose, Bld: 249 mg/dL — ABNORMAL HIGH (ref 70–99)
Potassium: 4.2 mEq/L (ref 3.5–5.1)
SODIUM: 138 meq/L (ref 135–145)
Total Bilirubin: 0.5 mg/dL (ref 0.2–1.2)
Total Protein: 7.6 g/dL (ref 6.0–8.3)

## 2014-12-30 MED ORDER — LEVOFLOXACIN 500 MG PO TABS: 500.0000 mg | ORAL_TABLET | Freq: Every day | ORAL | Status: DC

## 2014-12-30 NOTE — Patient Instructions (Signed)
Levaquin 500 mg pill daily for 7 days  Chest xray and lab tests today  Call if not feeling better  Follow up in 3 weeks with Dr. Halford Chessman or [facility]

## 2014-12-30 NOTE — Telephone Encounter (Signed)
Dg Chest 2 View  12/30/2014   CLINICAL DATA:  Shortness of breath.  Wheezing.  EXAM: CHEST  2 VIEW  COMPARISON:  None.  FINDINGS: Mediastinum hilar structures are unremarkable. Heart size normal. Diffuse bilateral pulmonary infiltrates. Findings consistent with pneumonitis. No pleural effusion or pneumothorax. No acute bony abnormality.  IMPRESSION: Diffuse bilateral pulmonary interstitial prominence suggesting pneumonitis.   Electronically Signed   By: Marcello Moores  Register   On: 12/30/2014 16:47    CMP Latest Ref Rng 12/30/2014 12/31/2012 06/14/2010  Glucose 70 - 99 mg/dL 249(H) 124(H) 104(H)  BUN 6 - 23 mg/dL 19 17 11   Creatinine 0.40 - 1.20 mg/dL 1.00 0.83 0.58  Sodium 135 - 145 mEq/L 138 141 135  Potassium 3.5 - 5.1 mEq/L 4.2 3.7 4.0  Chloride 96 - 112 mEq/L 101 99 101  CO2 19 - 32 mEq/L 29 33(H) 30  Calcium 8.4 - 10.5 mg/dL 9.6 10.0 8.8  Total Protein 6.0 - 8.3 g/dL 7.6 - -  Total Bilirubin 0.2 - 1.2 mg/dL 0.5 - -  Alkaline Phos 39 - 117 U/L 78 - -  AST 0 - 37 U/L 16 - -  ALT 0 - 35 U/L 22 - -    CBC Latest Ref Rng 12/30/2014 12/31/2012 06/14/2010  WBC 4.0 - 10.5 K/uL 6.9 8.6 12.3(H)  Hemoglobin 12.0 - 15.0 g/dL 12.3 12.9 9.8(L)  Hematocrit 36.0 - 46.0 % 39.6 40.3 31.7(L)  Platelets 150.0 - 400.0 K/uL 243.0 210 434(H)    Results d/w pt.  Advised her that if her symptoms get worse, then she might need to go to hospital.  Also asked her to call tomorrow to update status.  Will route message to my nurse to f/u with patient tomorrow.

## 2014-12-30 NOTE — Progress Notes (Signed)
Chief Complaint  Patient presents with  . 4 month follow up    former [facility] pt - c/o increased SOB, chest tightness, nonprod cough, and temp up to 100.5 x 2 days.  Wearing cpap nightly for approx 8 hours.  No problems with mask or pressure.    History of Present Illness: [name] is a 64 y.o. female former smoker with OSA and COPD.  She was previously followed by Dr. Gwenette Greet.  She has been doing well with CPAP and this helps her sleep.  She has noticed more dyspnea over the past few days.  She has burning in her chest and temp up to 100.5.  She has some cough, but no wheeze or sputum.  She denies sinus congestion, sore throat, nausea, abdominal pain, or diarrhea.  She has been getting headache for the past few days.  She has been using her albuterol more.  This helps some.  She is on remicade and MTX for RA per Dr. Amil Amen with rheumatology.  She is scheduled to have laminectomy done at end of October.  TESTS: PSG 09/02/07 >> AHI 9 PFT 09/18/07 >> FEV1 1.38 (61%), FEV1% 59, TLC 4.48 (95%), DLCO 80%, + BD  CPAP 11/28/14 to 12/27/14 >> used on 30 of 30 nights with average 8 hrs and 6 min.  Average AHI is 0.7 with CPAP 14 m H2O.   PMhx >> DM, HLD, GERD, Asthma, HTN, RA, Depression  Past surgical hx, Medications, Allergies, Family hx, Social hx all reviewed.   Physical Exam: BP 118/66 mmHg  Pulse 85  Temp(Src) 98.9 F (37.2 C) (Oral)  Ht 5\' 2"  (1.575 m)  Wt 270 lb 3.2 oz (122.562 kg)  BMI 49.41 kg/m2  SpO2 94%  General - sweaty ENT - No sinus tenderness, no oral exudate, no LAN Cardiac - s1s2 regular, no murmur Chest - No wheeze/rales/dullness Back - No focal tenderness Abd - Soft, non-tender Ext - No edema Neuro - Normal strength Skin - No rashes Psych - normal mood, and behavior   Assessment/Plan:  Acute bronchitis. Plan: - will give course of levaquin - will check CXR and labs - don't think she needs prednisone at this time  COPD with chronic  bronchitis. Plan: - continue advair, spiriva, and prn albuterol  Obstructive sleep apnea. She is compliant with therapy and reports benefit. Plan: - continue CPAP 14 cm H2O  Pre-operative respiratory assessment. Plan: - will have her follow up in 3 weeks to make sure there are no respiratory issues prior to her having laminectomy done   [name], MD [place] Pager:  [phone]

## 2015-01-07 NOTE — Telephone Encounter (Signed)
[phone] returning call

## 2015-01-07 NOTE — Telephone Encounter (Signed)
[email or web address]

## 2015-01-07 NOTE — Telephone Encounter (Signed)
Spoke with pt. States that she is still SOB, coughing up clear mucus and wheezing.

## 2015-01-08 NOTE — Telephone Encounter (Signed)
Left message for patient to call back if she needs further assistance.

## 2015-01-13 DIAGNOSIS — M2040 Other hammer toe(s) (acquired), unspecified foot: Secondary | ICD-10-CM | POA: Diagnosis not present

## 2015-01-13 DIAGNOSIS — M19071 Primary osteoarthritis, right ankle and foot: Secondary | ICD-10-CM | POA: Diagnosis not present

## 2015-01-13 DIAGNOSIS — E1142 Type 2 diabetes mellitus with diabetic polyneuropathy: Secondary | ICD-10-CM | POA: Diagnosis not present

## 2015-01-20 ENCOUNTER — Other Ambulatory Visit (INDEPENDENT_AMBULATORY_CARE_PROVIDER_SITE_OTHER): Payer: Medicare Other

## 2015-01-20 ENCOUNTER — Telehealth: Payer: Self-pay | Admitting: Pulmonary Disease

## 2015-01-20 ENCOUNTER — Ambulatory Visit (INDEPENDENT_AMBULATORY_CARE_PROVIDER_SITE_OTHER)
Admission: RE | Admit: 2015-01-20 | Discharge: 2015-01-20 | Disposition: A | Discharge status: Home / Self Care | Payer: Medicare Other | Source: Ambulatory Visit | Attending: Adult Health | Admitting: Adult Health

## 2015-01-20 ENCOUNTER — Ambulatory Visit (INDEPENDENT_AMBULATORY_CARE_PROVIDER_SITE_OTHER): Payer: Medicare Other | Admitting: Adult Health

## 2015-01-20 ENCOUNTER — Encounter: Payer: Self-pay | Admitting: Adult Health

## 2015-01-20 VITALS — BP 120/64 | HR 82 | Temp 98.3°F | Ht 62.0 in | Wt 274.0 lb

## 2015-01-20 DIAGNOSIS — J439 Emphysema, unspecified: Secondary | ICD-10-CM | POA: Diagnosis not present

## 2015-01-20 DIAGNOSIS — G4733 Obstructive sleep apnea (adult) (pediatric): Secondary | ICD-10-CM | POA: Diagnosis not present

## 2015-01-20 DIAGNOSIS — J449 Chronic obstructive pulmonary disease, unspecified: Secondary | ICD-10-CM | POA: Diagnosis not present

## 2015-01-20 LAB — SEDIMENTATION RATE: Sed Rate: 15 mm/hr (ref 0–22)

## 2015-01-20 NOTE — Assessment & Plan Note (Signed)
Compensated on CPAP .

## 2015-01-20 NOTE — Progress Notes (Signed)
Quick Note:  Called and spoke with pt. Reviewed results and recs. Pt voiced understanding and had no further questions. ______ 

## 2015-01-20 NOTE — Progress Notes (Signed)
Reviewed and agree with assessment/plan. 

## 2015-01-20 NOTE — Telephone Encounter (Signed)
I don't know how many more slots I have to double book visits in.  If there are any slots that can be double booked, then okay to double book.  Otherwise she will need to have ROV with Tammy Parrett or another provider.

## 2015-01-20 NOTE — Patient Instructions (Signed)
Continue on current regimen.  Lab and Chest xray today .  follow up Dr. Halford Chessman  In 6 weeks and As needed   Please contact office for sooner follow up if symptoms do not improve or worsen or seek emergency care

## 2015-01-20 NOTE — Telephone Encounter (Signed)
[phone], pt [name]

## 2015-01-20 NOTE — Telephone Encounter (Signed)
Patient saw TP and was told to come back for follow up with Dr. Halford Chessman in 6 weeks. No openings.  Dr. Halford Chessman, please advise.

## 2015-01-20 NOTE — Telephone Encounter (Signed)
[email or web address]

## 2015-01-20 NOTE — Telephone Encounter (Signed)
Pt called and informed of rec from vs Pt states that she will see TP again Pt scheduled to see TP on 03/02/15 at 2pm  Nothing further is needed

## 2015-01-20 NOTE — Progress Notes (Addendum)
   Subjective:    Patient ID: [name], female    DOB: January 24, 1951, 64 y.o.   MRN: [id number]  HPI 64 yo female former smoker with COPD and OSA on CPAP At bedtime    TESTS: PSG 09/02/07 >> AHI 9 PFT 09/18/07 >> FEV1 1.38 (61%), FEV1% 59, TLC 4.48 (95%), DLCO 80%, + BD  CPAP 11/28/14 to 12/27/14 >> used on 30 of 30 nights with average 8 hrs and 6 min. Average AHI is 0.7 with CPAP 14 m H2O.   PMhx >> DM, HLD, GERD, Asthma, HTN, RA, Depression  01/20/2015 Follow up : COPD flare  Pt returns for 3 week follow up .  She was seen in office few weeks ago for cough, congestion and low grade fevers Tx w/ Levaquin for 7 days . CXR showed diffuse bilateral pulmonary interstiital prominence ? Pneumonitis she is feeling better but has some lingering cough and wheezing.  She remains on Advair and Spiriva.  She does have RA on Remicade and MTX .  Marland Kitchen  She denies chest pain, orthopnea , hemoptysis , edema or fever.   On CPAP w/ excellent compliance and perceived benefits.  She has DM on insulin.  Has planned neck surgery but is putting this on hold for now.    Review of Systems Constitutional:   No  weight loss, night sweats,  Fevers, chills,  +fatigue, or  lassitude.  HEENT:   No headaches,  Difficulty swallowing,  Tooth/dental problems, or  Sore throat,                No sneezing, itching, ear ache, nasal congestion, post nasal drip,   CV:  No chest pain,  Orthopnea, PND, swelling in lower extremities, anasarca, dizziness, palpitations, syncope.   GI  No heartburn, indigestion, abdominal pain, nausea, vomiting, diarrhea, change in bowel habits, loss of appetite, bloody stools.   Resp:   No chest wall deformity  Skin: no rash or lesions.  GU: no dysuria, change in color of urine, no urgency or frequency.  No flank pain, no hematuria   MS:  No joint pain or swelling.  No decreased range of motion.  No back pain.  Psych:  No change in mood or affect. No depression or anxiety.  No memory  loss.         Objective:   Physical Exam  GEN: A/Ox3; pleasant , NAD, morbidly obese   HEENT:  [place]/AT,  EACs-clear, TMs-wnl, NOSE-clear, THROAT-clear, no lesions, no postnasal drip or exudate noted.   NECK:  Supple w/ fair ROM; no JVD; normal carotid impulses w/o bruits; no thyromegaly or nodules palpated; no lymphadenopathy.  RESP  Decreased BS in bases , faint exp wheeze on forced exp no accessory muscle use, no dullness to percussion  CARD:  RRR, no m/r/g  , no peripheral edema, pulses intact, no cyanosis or clubbing.  GI:   Soft & nt; nml bowel sounds; no organomegaly or masses detected.  Musco: Warm bil, no deformities or joint swelling noted.   Neuro: alert, no focal deficits noted.    Skin: Warm, no lesions or rashes        Assessment & Plan:

## 2015-01-20 NOTE — Assessment & Plan Note (Signed)
Recent flare with ? PNA vs Pnuemonitis on CXR  Improving with ABX  Will repeat CXR today ,also check ESR (on MTX  ) Consider PFT/spiro in future   Plan  Continue on current regimen.  Lab and Chest xray today .  follow up Dr. Halford Chessman  In 6 weeks and As needed   Please contact office for sooner follow up if symptoms do not improve or worsen or seek emergency care

## 2015-01-20 NOTE — Assessment & Plan Note (Signed)
Wt loss  

## 2015-01-27 ENCOUNTER — Other Ambulatory Visit (HOSPITAL_COMMUNITY): Payer: Medicare Other

## 2015-01-29 DIAGNOSIS — M0589 Other rheumatoid arthritis with rheumatoid factor of multiple sites: Secondary | ICD-10-CM | POA: Diagnosis not present

## 2015-02-05 ENCOUNTER — Inpatient Hospital Stay (HOSPITAL_COMMUNITY): Admission: RE | Admit: 2015-02-05 | Payer: Medicare Other | Source: Ambulatory Visit | Admitting: Orthopedic Surgery

## 2015-02-05 ENCOUNTER — Encounter (HOSPITAL_COMMUNITY): Admission: RE | Payer: Self-pay | Source: Ambulatory Visit

## 2015-02-05 SURGERY — ANTERIOR CERVICAL DECOMPRESSION/DISCECTOMY FUSION 2 LEVELS
Anesthesia: General

## 2015-03-02 ENCOUNTER — Ambulatory Visit (INDEPENDENT_AMBULATORY_CARE_PROVIDER_SITE_OTHER): Payer: Medicare Other | Admitting: Adult Health

## 2015-03-02 ENCOUNTER — Encounter: Payer: Self-pay | Admitting: Adult Health

## 2015-03-02 ENCOUNTER — Other Ambulatory Visit: Payer: Self-pay | Admitting: Pulmonary Disease

## 2015-03-02 VITALS — BP 112/60 | HR 82 | Temp 98.2°F | Ht 61.0 in | Wt 277.0 lb

## 2015-03-02 DIAGNOSIS — J439 Emphysema, unspecified: Secondary | ICD-10-CM | POA: Diagnosis not present

## 2015-03-02 DIAGNOSIS — G4733 Obstructive sleep apnea (adult) (pediatric): Secondary | ICD-10-CM

## 2015-03-02 MED ORDER — FLUTICASONE-SALMETEROL 115-21 MCG/ACT IN AERO: 2.0000 | INHALATION_SPRAY | Freq: Two times a day (BID) | RESPIRATORY_TRACT | Status: DC

## 2015-03-02 MED ORDER — FLUTICASONE-SALMETEROL 115-21 MCG/ACT IN AERO: INHALATION_SPRAY | RESPIRATORY_TRACT | Status: DC

## 2015-03-02 MED ORDER — AZITHROMYCIN 250 MG PO TABS: ORAL_TABLET | ORAL | Status: AC

## 2015-03-02 NOTE — Assessment & Plan Note (Signed)
Recurrent flare (immunosupressed on Remicade)   Plan  Zpack take as directed  Eat yogurt while on antibiotics/ Align probiotic  Mucinex DM Twice daily  As needed  Cough/congestion  Saline nasal rinses As needed     Please contact office for sooner follow up if symptoms do not improve or worsen or seek emergency care  Follow up with Dr. Halford Chessman  In 3-4 months and As needed

## 2015-03-02 NOTE — Assessment & Plan Note (Signed)
Controlled on CPAP  

## 2015-03-02 NOTE — Progress Notes (Signed)
   Subjective:    Patient ID: [name], female    DOB: [birth year], 64 y.o.   MRN: [id number]  HPI  64 yo female former smoker with COPD and OSA on CPAP At bedtime    TESTS: PSG 09/02/07 >> AHI 9 PFT 09/18/07 >> FEV1 1.38 (61%), FEV1% 59, TLC 4.48 (95%), DLCO 80%, + BD  CPAP 11/28/14 to 12/27/14 >> used on 30 of 30 nights with average 8 hrs and 6 min. Average AHI is 0.7 with CPAP 14 m H2O.   PMhx >> DM, HLD, GERD, Asthma, HTN, RA, Depression  03/02/2015 Follow up : COPD and OSA  Pt returns for 1 month follow up .  She has OSA says she is doing well on CPAP.  Download shows excellent compliance with AHI 1.1 , min leaks, set pressure at 14cm.   Had a COPD flare 1 month ago, improved with levaquin  Says she was well until last week.  Exposed to grandchild with URI.  Complains of cough with green mucus , nasal drainage .  She remains on Advair and Spiriva.  She does have RA on Remicade and MTX .  Marland Kitchen  She denies chest pain, orthopnea , hemoptysis , edema or fever.    Review of Systems  Constitutional:   No  weight loss, night sweats,  Fevers, chills,  +fatigue, or  lassitude.  HEENT:   No headaches,  Difficulty swallowing,  Tooth/dental problems, or  Sore throat,                No sneezing, itching, ear ache,  +nasal congestion, post nasal drip,   CV:  No chest pain,  Orthopnea, PND, swelling in lower extremities, anasarca, dizziness, palpitations, syncope.   GI  No heartburn, indigestion, abdominal pain, nausea, vomiting, diarrhea, change in bowel habits, loss of appetite, bloody stools.   Resp:   No chest wall deformity  Skin: no rash or lesions.  GU: no dysuria, change in color of urine, no urgency or frequency.  No flank pain, no hematuria   MS:  No joint pain or swelling.  No decreased range of motion.  No back pain.  Psych:  No change in mood or affect. No depression or anxiety.  No memory loss.         Objective:   Physical Exam   GEN: A/Ox3; pleasant ,  NAD, morbidly obese   HEENT:  [place]/AT,  EACs-clear, TMs-wnl, NOSE-clear drainage , THROAT-clear, no lesions, no postnasal drip or exudate noted.   NECK:  Supple w/ fair ROM; no JVD; normal carotid impulses w/o bruits; no thyromegaly or nodules palpated; no lymphadenopathy.  RESP  Decreased BS in bases , faint exp wheeze on forced exp no accessory muscle use, no dullness to percussion  CARD:  RRR, no m/r/g  , no peripheral edema, pulses intact, no cyanosis or clubbing.  GI:   Soft & nt; nml bowel sounds; no organomegaly or masses detected.  Musco: Warm bil, no deformities or joint swelling noted. Arthritic changes in hands.   Neuro: alert, no focal deficits noted.    Skin: Warm, no lesions or rashes        Assessment & Plan:

## 2015-03-02 NOTE — Addendum Note (Signed)
Addended by: Osa Craver on: 03/02/2015 03:13 PM   Modules accepted: Orders

## 2015-03-02 NOTE — Patient Instructions (Addendum)
Zpack take as directed  Eat yogurt while on antibiotics/ Align probiotic  Mucinex DM Twice daily  As needed  Cough/congestion  Saline nasal rinses As needed   Continue on CPAP At bedtime  .  Work weight loss .  Please contact office for sooner follow up if symptoms do not improve or worsen or seek emergency care  Follow up with Dr. Halford Chessman  In 3-4 months and As needed

## 2015-03-02 NOTE — Assessment & Plan Note (Signed)
Work on wt loss.  

## 2015-03-03 DIAGNOSIS — L851 Acquired keratosis [keratoderma] palmaris et plantaris: Secondary | ICD-10-CM | POA: Diagnosis not present

## 2015-03-03 DIAGNOSIS — B351 Tinea unguium: Secondary | ICD-10-CM | POA: Diagnosis not present

## 2015-03-03 DIAGNOSIS — E1342 Other specified diabetes mellitus with diabetic polyneuropathy: Secondary | ICD-10-CM | POA: Diagnosis not present

## 2015-03-05 NOTE — Progress Notes (Signed)
Reviewed and agree with assessment/plan. 

## 2015-03-10 DIAGNOSIS — M1990 Unspecified osteoarthritis, unspecified site: Secondary | ICD-10-CM | POA: Diagnosis not present

## 2015-03-10 DIAGNOSIS — E119 Type 2 diabetes mellitus without complications: Secondary | ICD-10-CM | POA: Diagnosis not present

## 2015-03-10 DIAGNOSIS — E785 Hyperlipidemia, unspecified: Secondary | ICD-10-CM | POA: Diagnosis not present

## 2015-03-10 DIAGNOSIS — Z79899 Other long term (current) drug therapy: Secondary | ICD-10-CM | POA: Diagnosis not present

## 2015-03-11 ENCOUNTER — Encounter: Payer: Self-pay | Admitting: Adult Health

## 2015-03-17 DIAGNOSIS — Z6841 Body Mass Index (BMI) 40.0 and over, adult: Secondary | ICD-10-CM | POA: Diagnosis not present

## 2015-03-17 DIAGNOSIS — E119 Type 2 diabetes mellitus without complications: Secondary | ICD-10-CM | POA: Diagnosis not present

## 2015-03-17 DIAGNOSIS — I10 Essential (primary) hypertension: Secondary | ICD-10-CM | POA: Diagnosis not present

## 2015-03-24 DIAGNOSIS — D239 Other benign neoplasm of skin, unspecified: Secondary | ICD-10-CM | POA: Diagnosis not present

## 2015-03-24 DIAGNOSIS — L570 Actinic keratosis: Secondary | ICD-10-CM | POA: Diagnosis not present

## 2015-03-25 DIAGNOSIS — Z79899 Other long term (current) drug therapy: Secondary | ICD-10-CM | POA: Diagnosis not present

## 2015-03-25 DIAGNOSIS — M150 Primary generalized (osteo)arthritis: Secondary | ICD-10-CM | POA: Diagnosis not present

## 2015-03-25 DIAGNOSIS — M0589 Other rheumatoid arthritis with rheumatoid factor of multiple sites: Secondary | ICD-10-CM | POA: Diagnosis not present

## 2015-04-08 DIAGNOSIS — Z79899 Other long term (current) drug therapy: Secondary | ICD-10-CM | POA: Diagnosis not present

## 2015-04-08 DIAGNOSIS — M0589 Other rheumatoid arthritis with rheumatoid factor of multiple sites: Secondary | ICD-10-CM | POA: Diagnosis not present

## 2015-05-11 DIAGNOSIS — M791 Myalgia: Secondary | ICD-10-CM | POA: Diagnosis not present

## 2015-05-11 DIAGNOSIS — M25551 Pain in right hip: Secondary | ICD-10-CM | POA: Diagnosis not present

## 2015-05-11 DIAGNOSIS — M542 Cervicalgia: Secondary | ICD-10-CM | POA: Diagnosis not present

## 2015-05-11 DIAGNOSIS — M461 Sacroiliitis, not elsewhere classified: Secondary | ICD-10-CM | POA: Diagnosis not present

## 2015-05-11 DIAGNOSIS — M545 Low back pain: Secondary | ICD-10-CM | POA: Diagnosis not present

## 2015-05-11 DIAGNOSIS — M4726 Other spondylosis with radiculopathy, lumbar region: Secondary | ICD-10-CM | POA: Diagnosis not present

## 2015-05-11 DIAGNOSIS — M5416 Radiculopathy, lumbar region: Secondary | ICD-10-CM | POA: Diagnosis not present

## 2015-05-12 DIAGNOSIS — L851 Acquired keratosis [keratoderma] palmaris et plantaris: Secondary | ICD-10-CM | POA: Diagnosis not present

## 2015-05-12 DIAGNOSIS — B351 Tinea unguium: Secondary | ICD-10-CM | POA: Diagnosis not present

## 2015-05-12 DIAGNOSIS — E1342 Other specified diabetes mellitus with diabetic polyneuropathy: Secondary | ICD-10-CM | POA: Diagnosis not present

## 2015-05-13 DIAGNOSIS — M9901 Segmental and somatic dysfunction of cervical region: Secondary | ICD-10-CM | POA: Diagnosis not present

## 2015-05-13 DIAGNOSIS — M791 Myalgia: Secondary | ICD-10-CM | POA: Diagnosis not present

## 2015-05-13 DIAGNOSIS — M6240 Contracture of muscle, unspecified site: Secondary | ICD-10-CM | POA: Diagnosis not present

## 2015-05-13 DIAGNOSIS — M461 Sacroiliitis, not elsewhere classified: Secondary | ICD-10-CM | POA: Diagnosis not present

## 2015-05-13 DIAGNOSIS — M5137 Other intervertebral disc degeneration, lumbosacral region: Secondary | ICD-10-CM | POA: Diagnosis not present

## 2015-05-13 DIAGNOSIS — M5416 Radiculopathy, lumbar region: Secondary | ICD-10-CM | POA: Diagnosis not present

## 2015-05-13 DIAGNOSIS — M4726 Other spondylosis with radiculopathy, lumbar region: Secondary | ICD-10-CM | POA: Diagnosis not present

## 2015-05-13 DIAGNOSIS — M545 Low back pain: Secondary | ICD-10-CM | POA: Diagnosis not present

## 2015-05-13 DIAGNOSIS — M9903 Segmental and somatic dysfunction of lumbar region: Secondary | ICD-10-CM | POA: Diagnosis not present

## 2015-05-13 DIAGNOSIS — M50322 Other cervical disc degeneration at C5-C6 level: Secondary | ICD-10-CM | POA: Diagnosis not present

## 2015-05-13 DIAGNOSIS — R293 Abnormal posture: Secondary | ICD-10-CM | POA: Diagnosis not present

## 2015-05-14 DIAGNOSIS — M5416 Radiculopathy, lumbar region: Secondary | ICD-10-CM | POA: Diagnosis not present

## 2015-05-14 DIAGNOSIS — M5417 Radiculopathy, lumbosacral region: Secondary | ICD-10-CM | POA: Diagnosis not present

## 2015-05-14 DIAGNOSIS — M4726 Other spondylosis with radiculopathy, lumbar region: Secondary | ICD-10-CM | POA: Diagnosis not present

## 2015-05-14 DIAGNOSIS — M50322 Other cervical disc degeneration at C5-C6 level: Secondary | ICD-10-CM | POA: Diagnosis not present

## 2015-05-14 DIAGNOSIS — M5137 Other intervertebral disc degeneration, lumbosacral region: Secondary | ICD-10-CM | POA: Diagnosis not present

## 2015-05-14 DIAGNOSIS — R293 Abnormal posture: Secondary | ICD-10-CM | POA: Diagnosis not present

## 2015-05-14 DIAGNOSIS — M6240 Contracture of muscle, unspecified site: Secondary | ICD-10-CM | POA: Diagnosis not present

## 2015-05-14 DIAGNOSIS — M9903 Segmental and somatic dysfunction of lumbar region: Secondary | ICD-10-CM | POA: Diagnosis not present

## 2015-05-14 DIAGNOSIS — M461 Sacroiliitis, not elsewhere classified: Secondary | ICD-10-CM | POA: Diagnosis not present

## 2015-05-14 DIAGNOSIS — M791 Myalgia: Secondary | ICD-10-CM | POA: Diagnosis not present

## 2015-05-14 DIAGNOSIS — M9901 Segmental and somatic dysfunction of cervical region: Secondary | ICD-10-CM | POA: Diagnosis not present

## 2015-05-18 DIAGNOSIS — M9903 Segmental and somatic dysfunction of lumbar region: Secondary | ICD-10-CM | POA: Diagnosis not present

## 2015-05-18 DIAGNOSIS — M5137 Other intervertebral disc degeneration, lumbosacral region: Secondary | ICD-10-CM | POA: Diagnosis not present

## 2015-05-18 DIAGNOSIS — M6240 Contracture of muscle, unspecified site: Secondary | ICD-10-CM | POA: Diagnosis not present

## 2015-05-18 DIAGNOSIS — M4726 Other spondylosis with radiculopathy, lumbar region: Secondary | ICD-10-CM | POA: Diagnosis not present

## 2015-05-18 DIAGNOSIS — M50322 Other cervical disc degeneration at C5-C6 level: Secondary | ICD-10-CM | POA: Diagnosis not present

## 2015-05-18 DIAGNOSIS — M461 Sacroiliitis, not elsewhere classified: Secondary | ICD-10-CM | POA: Diagnosis not present

## 2015-05-18 DIAGNOSIS — R293 Abnormal posture: Secondary | ICD-10-CM | POA: Diagnosis not present

## 2015-05-18 DIAGNOSIS — M791 Myalgia: Secondary | ICD-10-CM | POA: Diagnosis not present

## 2015-05-18 DIAGNOSIS — M545 Low back pain: Secondary | ICD-10-CM | POA: Diagnosis not present

## 2015-05-18 DIAGNOSIS — M9901 Segmental and somatic dysfunction of cervical region: Secondary | ICD-10-CM | POA: Diagnosis not present

## 2015-05-18 DIAGNOSIS — M5416 Radiculopathy, lumbar region: Secondary | ICD-10-CM | POA: Diagnosis not present

## 2015-05-21 DIAGNOSIS — R293 Abnormal posture: Secondary | ICD-10-CM | POA: Diagnosis not present

## 2015-05-21 DIAGNOSIS — M6240 Contracture of muscle, unspecified site: Secondary | ICD-10-CM | POA: Diagnosis not present

## 2015-05-21 DIAGNOSIS — M5416 Radiculopathy, lumbar region: Secondary | ICD-10-CM | POA: Diagnosis not present

## 2015-05-21 DIAGNOSIS — M4726 Other spondylosis with radiculopathy, lumbar region: Secondary | ICD-10-CM | POA: Diagnosis not present

## 2015-05-21 DIAGNOSIS — M9903 Segmental and somatic dysfunction of lumbar region: Secondary | ICD-10-CM | POA: Diagnosis not present

## 2015-05-21 DIAGNOSIS — M9901 Segmental and somatic dysfunction of cervical region: Secondary | ICD-10-CM | POA: Diagnosis not present

## 2015-05-21 DIAGNOSIS — M50322 Other cervical disc degeneration at C5-C6 level: Secondary | ICD-10-CM | POA: Diagnosis not present

## 2015-05-21 DIAGNOSIS — M461 Sacroiliitis, not elsewhere classified: Secondary | ICD-10-CM | POA: Diagnosis not present

## 2015-05-21 DIAGNOSIS — M791 Myalgia: Secondary | ICD-10-CM | POA: Diagnosis not present

## 2015-05-21 DIAGNOSIS — M5137 Other intervertebral disc degeneration, lumbosacral region: Secondary | ICD-10-CM | POA: Diagnosis not present

## 2015-05-28 ENCOUNTER — Other Ambulatory Visit: Payer: Self-pay | Admitting: Pulmonary Disease

## 2015-05-28 DIAGNOSIS — M791 Myalgia: Secondary | ICD-10-CM | POA: Diagnosis not present

## 2015-05-28 DIAGNOSIS — M50322 Other cervical disc degeneration at C5-C6 level: Secondary | ICD-10-CM | POA: Diagnosis not present

## 2015-05-28 DIAGNOSIS — M9901 Segmental and somatic dysfunction of cervical region: Secondary | ICD-10-CM | POA: Diagnosis not present

## 2015-05-28 DIAGNOSIS — M6240 Contracture of muscle, unspecified site: Secondary | ICD-10-CM | POA: Diagnosis not present

## 2015-05-28 DIAGNOSIS — M4726 Other spondylosis with radiculopathy, lumbar region: Secondary | ICD-10-CM | POA: Diagnosis not present

## 2015-05-28 DIAGNOSIS — M461 Sacroiliitis, not elsewhere classified: Secondary | ICD-10-CM | POA: Diagnosis not present

## 2015-05-28 DIAGNOSIS — M5137 Other intervertebral disc degeneration, lumbosacral region: Secondary | ICD-10-CM | POA: Diagnosis not present

## 2015-05-28 DIAGNOSIS — R293 Abnormal posture: Secondary | ICD-10-CM | POA: Diagnosis not present

## 2015-05-28 DIAGNOSIS — M5416 Radiculopathy, lumbar region: Secondary | ICD-10-CM | POA: Diagnosis not present

## 2015-05-28 DIAGNOSIS — M9903 Segmental and somatic dysfunction of lumbar region: Secondary | ICD-10-CM | POA: Diagnosis not present

## 2015-05-29 DIAGNOSIS — M9901 Segmental and somatic dysfunction of cervical region: Secondary | ICD-10-CM | POA: Diagnosis not present

## 2015-05-29 DIAGNOSIS — M50322 Other cervical disc degeneration at C5-C6 level: Secondary | ICD-10-CM | POA: Diagnosis not present

## 2015-05-29 DIAGNOSIS — M9903 Segmental and somatic dysfunction of lumbar region: Secondary | ICD-10-CM | POA: Diagnosis not present

## 2015-05-29 DIAGNOSIS — M5137 Other intervertebral disc degeneration, lumbosacral region: Secondary | ICD-10-CM | POA: Diagnosis not present

## 2015-05-29 DIAGNOSIS — R293 Abnormal posture: Secondary | ICD-10-CM | POA: Diagnosis not present

## 2015-05-29 DIAGNOSIS — M6240 Contracture of muscle, unspecified site: Secondary | ICD-10-CM | POA: Diagnosis not present

## 2015-06-02 DIAGNOSIS — M5137 Other intervertebral disc degeneration, lumbosacral region: Secondary | ICD-10-CM | POA: Diagnosis not present

## 2015-06-02 DIAGNOSIS — M545 Low back pain: Secondary | ICD-10-CM | POA: Diagnosis not present

## 2015-06-02 DIAGNOSIS — M9901 Segmental and somatic dysfunction of cervical region: Secondary | ICD-10-CM | POA: Diagnosis not present

## 2015-06-02 DIAGNOSIS — M9903 Segmental and somatic dysfunction of lumbar region: Secondary | ICD-10-CM | POA: Diagnosis not present

## 2015-06-02 DIAGNOSIS — M461 Sacroiliitis, not elsewhere classified: Secondary | ICD-10-CM | POA: Diagnosis not present

## 2015-06-02 DIAGNOSIS — M50322 Other cervical disc degeneration at C5-C6 level: Secondary | ICD-10-CM | POA: Diagnosis not present

## 2015-06-02 DIAGNOSIS — M5416 Radiculopathy, lumbar region: Secondary | ICD-10-CM | POA: Diagnosis not present

## 2015-06-02 DIAGNOSIS — M791 Myalgia: Secondary | ICD-10-CM | POA: Diagnosis not present

## 2015-06-02 DIAGNOSIS — R293 Abnormal posture: Secondary | ICD-10-CM | POA: Diagnosis not present

## 2015-06-02 DIAGNOSIS — M4726 Other spondylosis with radiculopathy, lumbar region: Secondary | ICD-10-CM | POA: Diagnosis not present

## 2015-06-02 DIAGNOSIS — M6240 Contracture of muscle, unspecified site: Secondary | ICD-10-CM | POA: Diagnosis not present

## 2015-06-04 DIAGNOSIS — M461 Sacroiliitis, not elsewhere classified: Secondary | ICD-10-CM | POA: Diagnosis not present

## 2015-06-04 DIAGNOSIS — M6240 Contracture of muscle, unspecified site: Secondary | ICD-10-CM | POA: Diagnosis not present

## 2015-06-04 DIAGNOSIS — M9903 Segmental and somatic dysfunction of lumbar region: Secondary | ICD-10-CM | POA: Diagnosis not present

## 2015-06-04 DIAGNOSIS — M5416 Radiculopathy, lumbar region: Secondary | ICD-10-CM | POA: Diagnosis not present

## 2015-06-04 DIAGNOSIS — M4726 Other spondylosis with radiculopathy, lumbar region: Secondary | ICD-10-CM | POA: Diagnosis not present

## 2015-06-04 DIAGNOSIS — R293 Abnormal posture: Secondary | ICD-10-CM | POA: Diagnosis not present

## 2015-06-04 DIAGNOSIS — M5137 Other intervertebral disc degeneration, lumbosacral region: Secondary | ICD-10-CM | POA: Diagnosis not present

## 2015-06-04 DIAGNOSIS — M50322 Other cervical disc degeneration at C5-C6 level: Secondary | ICD-10-CM | POA: Diagnosis not present

## 2015-06-04 DIAGNOSIS — M9901 Segmental and somatic dysfunction of cervical region: Secondary | ICD-10-CM | POA: Diagnosis not present

## 2015-06-04 DIAGNOSIS — M791 Myalgia: Secondary | ICD-10-CM | POA: Diagnosis not present

## 2015-06-08 DIAGNOSIS — M461 Sacroiliitis, not elsewhere classified: Secondary | ICD-10-CM | POA: Diagnosis not present

## 2015-06-08 DIAGNOSIS — M4726 Other spondylosis with radiculopathy, lumbar region: Secondary | ICD-10-CM | POA: Diagnosis not present

## 2015-06-08 DIAGNOSIS — M791 Myalgia: Secondary | ICD-10-CM | POA: Diagnosis not present

## 2015-06-08 DIAGNOSIS — M5416 Radiculopathy, lumbar region: Secondary | ICD-10-CM | POA: Diagnosis not present

## 2015-06-08 DIAGNOSIS — M6240 Contracture of muscle, unspecified site: Secondary | ICD-10-CM | POA: Diagnosis not present

## 2015-06-08 DIAGNOSIS — M9903 Segmental and somatic dysfunction of lumbar region: Secondary | ICD-10-CM | POA: Diagnosis not present

## 2015-06-08 DIAGNOSIS — M5137 Other intervertebral disc degeneration, lumbosacral region: Secondary | ICD-10-CM | POA: Diagnosis not present

## 2015-06-08 DIAGNOSIS — M545 Low back pain: Secondary | ICD-10-CM | POA: Diagnosis not present

## 2015-06-08 DIAGNOSIS — R293 Abnormal posture: Secondary | ICD-10-CM | POA: Diagnosis not present

## 2015-06-08 DIAGNOSIS — M50322 Other cervical disc degeneration at C5-C6 level: Secondary | ICD-10-CM | POA: Diagnosis not present

## 2015-06-08 DIAGNOSIS — M9901 Segmental and somatic dysfunction of cervical region: Secondary | ICD-10-CM | POA: Diagnosis not present

## 2015-06-09 DIAGNOSIS — M0589 Other rheumatoid arthritis with rheumatoid factor of multiple sites: Secondary | ICD-10-CM | POA: Diagnosis not present

## 2015-06-09 DIAGNOSIS — Z79899 Other long term (current) drug therapy: Secondary | ICD-10-CM | POA: Diagnosis not present

## 2015-06-10 DIAGNOSIS — M9903 Segmental and somatic dysfunction of lumbar region: Secondary | ICD-10-CM | POA: Diagnosis not present

## 2015-06-10 DIAGNOSIS — M5416 Radiculopathy, lumbar region: Secondary | ICD-10-CM | POA: Diagnosis not present

## 2015-06-10 DIAGNOSIS — M5137 Other intervertebral disc degeneration, lumbosacral region: Secondary | ICD-10-CM | POA: Diagnosis not present

## 2015-06-10 DIAGNOSIS — M50322 Other cervical disc degeneration at C5-C6 level: Secondary | ICD-10-CM | POA: Diagnosis not present

## 2015-06-10 DIAGNOSIS — M791 Myalgia: Secondary | ICD-10-CM | POA: Diagnosis not present

## 2015-06-10 DIAGNOSIS — M4726 Other spondylosis with radiculopathy, lumbar region: Secondary | ICD-10-CM | POA: Diagnosis not present

## 2015-06-10 DIAGNOSIS — M461 Sacroiliitis, not elsewhere classified: Secondary | ICD-10-CM | POA: Diagnosis not present

## 2015-06-10 DIAGNOSIS — R293 Abnormal posture: Secondary | ICD-10-CM | POA: Diagnosis not present

## 2015-06-10 DIAGNOSIS — M6240 Contracture of muscle, unspecified site: Secondary | ICD-10-CM | POA: Diagnosis not present

## 2015-06-10 DIAGNOSIS — M9901 Segmental and somatic dysfunction of cervical region: Secondary | ICD-10-CM | POA: Diagnosis not present

## 2015-06-16 ENCOUNTER — Encounter: Payer: Self-pay | Admitting: Pulmonary Disease

## 2015-06-16 ENCOUNTER — Ambulatory Visit (INDEPENDENT_AMBULATORY_CARE_PROVIDER_SITE_OTHER): Payer: Medicare Other | Admitting: Pulmonary Disease

## 2015-06-16 VITALS — BP 118/72 | HR 83 | Ht 62.0 in | Wt 280.6 lb

## 2015-06-16 DIAGNOSIS — G4733 Obstructive sleep apnea (adult) (pediatric): Secondary | ICD-10-CM

## 2015-06-16 DIAGNOSIS — M461 Sacroiliitis, not elsewhere classified: Secondary | ICD-10-CM | POA: Diagnosis not present

## 2015-06-16 DIAGNOSIS — M4726 Other spondylosis with radiculopathy, lumbar region: Secondary | ICD-10-CM | POA: Diagnosis not present

## 2015-06-16 DIAGNOSIS — M545 Low back pain: Secondary | ICD-10-CM | POA: Diagnosis not present

## 2015-06-16 DIAGNOSIS — M5416 Radiculopathy, lumbar region: Secondary | ICD-10-CM | POA: Diagnosis not present

## 2015-06-16 DIAGNOSIS — J449 Chronic obstructive pulmonary disease, unspecified: Secondary | ICD-10-CM

## 2015-06-16 DIAGNOSIS — M9903 Segmental and somatic dysfunction of lumbar region: Secondary | ICD-10-CM | POA: Diagnosis not present

## 2015-06-16 DIAGNOSIS — M6240 Contracture of muscle, unspecified site: Secondary | ICD-10-CM | POA: Diagnosis not present

## 2015-06-16 DIAGNOSIS — R293 Abnormal posture: Secondary | ICD-10-CM | POA: Diagnosis not present

## 2015-06-16 DIAGNOSIS — M50322 Other cervical disc degeneration at C5-C6 level: Secondary | ICD-10-CM | POA: Diagnosis not present

## 2015-06-16 DIAGNOSIS — M9901 Segmental and somatic dysfunction of cervical region: Secondary | ICD-10-CM | POA: Diagnosis not present

## 2015-06-16 DIAGNOSIS — M5137 Other intervertebral disc degeneration, lumbosacral region: Secondary | ICD-10-CM | POA: Diagnosis not present

## 2015-06-16 DIAGNOSIS — M791 Myalgia: Secondary | ICD-10-CM | POA: Diagnosis not present

## 2015-06-16 NOTE — Patient Instructions (Signed)
Follow up in 6 months 

## 2015-06-16 NOTE — Progress Notes (Signed)
Current Outpatient Prescriptions on File Prior to Visit  Medication Sig  . Albiglutide 30 MG PEN Inject into the skin. Once weekly  . ALPRAZolam (XANAX) 0.5 MG tablet Take 0.25-0.5 mg by mouth 3 (three) times daily as needed for sleep or anxiety.  Marland Kitchen aspirin 81 MG tablet Take 81 mg by mouth daily.  . Calcium Carbonate-Vitamin D (CALCIUM 600+D) 600-400 MG-UNIT per tablet Take 1 tablet by mouth 2 (two) times daily.    . diclofenac-misoprostol (ARTHROTEC 75) 75-0.2 MG per tablet Take 1 tablet by mouth 2 (two) times daily.    . empagliflozin (JARDIANCE) 25 MG TABS tablet Take 25 mg by mouth daily.  . fexofenadine (ALLEGRA) 180 MG tablet Take 180 mg by mouth daily.    . fish oil-omega-3 fatty acids 1000 MG capsule Take 2 g by mouth every evening.   . fluticasone-salmeterol (ADVAIR HFA) 115-21 MCG/ACT inhaler Inhale 2 puffs into the lungs 2 (two) times daily.  . folic acid (FOLVITE) 1 MG tablet Take 1 mg by mouth daily.  . furosemide (LASIX) 40 MG tablet Take 40 mg by mouth daily as needed for fluid. Fluid  . gabapentin (NEURONTIN) 600 MG tablet Take 600 mg by mouth at bedtime.  Marland Kitchen ibuprofen (ADVIL,MOTRIN) 200 MG tablet Take 400-600 mg by mouth every 8 (eight) hours as needed. Pain  . InFLIXimab (REMICADE IV) Inject into the vein. Every 8 weeks  . insulin aspart (NOVOLOG) 100 UNIT/ML injection Inject 10-15 Units into the skin 3 (three) times daily before meals. Inject 10Units subcutaneously every morning, 15 Units subcutaneously at lunchtime and 10 Units subcutaneously at bedtime as directed  . insulin glargine (LANTUS) 100 UNIT/ML injection Inject 80-100 Units into the skin 2 (two) times daily. Per patient 80 in am and 80 in pm.  . losartan (COZAAR) 100 MG tablet Take 100 mg by mouth daily.  . metFORMIN (GLUCOPHAGE-XR) 500 MG 24 hr tablet Take 2,000 mg by mouth daily.    . methocarbamol (ROBAXIN) 750 MG tablet Take 750 mg by mouth every 8 (eight) hours as needed. Muscle Spasms  . methotrexate  (RHEUMATREX) 2.5 MG tablet Take 20 mg by mouth once a week. Caution:Chemotherapy. Protect from light.  . Multiple Vitamins-Minerals (ICAPS MV PO) Take 1 tablet by mouth 2 (two) times daily.  Marland Kitchen omeprazole (PRILOSEC) 40 MG capsule TAKE 1 CAPSULE EVERY MORNING  . PROAIR HFA 108 (90 BASE) MCG/ACT inhaler INHALE 2 PUFFS INTO THE LUNGS EVERY 6 HOURS AS NEEDED FOR WHEEZING OR SHORTNESS OF BREATH  . sertraline (ZOLOFT) 100 MG tablet Take 100 mg by mouth daily.    . simvastatin (ZOCOR) 40 MG tablet Take 40 mg by mouth daily with supper.   . tiotropium (SPIRIVA) 18 MCG inhalation capsule Place 18 mcg into inhaler and inhale daily.     No current facility-administered medications on file prior to visit.     Chief Complaint  Patient presents with  . Follow-up    Wears CPAP nightly. Denies any issues with mask/pressure. DME: [facility]     Tests PSG 09/02/07 >> AHI 9 PFT 09/18/07 >> FEV1 1.38 (61%), FEV1% 59, TLC 4.48 (95%), DLCO 80%, + BD CPAP 03/17/15 to 06/14/15 >> used on 83 of 90 nights with average 8 hrs 8 min.  Average AHI 1.5 with CPAP 14 cm H2O  Past medical history DM, HLD, GERD, Asthma, HTN, RA, Depression  Past surgical hx, Medications, Allergies, Family hx, Social hx all reviewed.  Vital Signs BP 118/72 mmHg  Pulse 83  Ht  5\' 2"  (1.575 m)  Wt 280 lb 9.6 oz (127.279 kg)  BMI 51.31 kg/m2  SpO2 94%  History of Present Illness [name] is a 65 y.o. female former smoker with OSA and COPD.  Her breathing is doing better since I last saw her.  She gets cough with phlegm in the morning, but is okay after that.  She is not needing to use proair.  She denies chest pain, wheeze, or fever.  She uses CPAP most nights.  This helps.  She feels her mask fits well.  She feels this helps her sleep and daytime alertness.   Physical Exam  General - No distress ENT - No sinus tenderness, no oral exudate, no LAN, MP 3 Cardiac - s1s2 regular, no murmur Chest - No wheeze/rales/dullness Back - No  focal tenderness Abd - Soft, non-tender Ext - No edema Neuro - Normal strength Skin - No rashes Psych - normal mood, and behavior   Assessment/Plan  COPD with chronic bronchitis. Plan: - continue advair, spiriva, and prn albuterol  Obstructive sleep apnea. She is compliant with therapy and reports benefit. Plan: - continue CPAP 14 cm H2O  Rheumatoid arthritis. Plan: - she is to f/u with Dr. Amil Amen   Patient Instructions  Follow up in 6 months     [name], MD [place] [facility]/Sleep Pager:  [phone]

## 2015-06-18 DIAGNOSIS — M9901 Segmental and somatic dysfunction of cervical region: Secondary | ICD-10-CM | POA: Diagnosis not present

## 2015-06-18 DIAGNOSIS — R293 Abnormal posture: Secondary | ICD-10-CM | POA: Diagnosis not present

## 2015-06-18 DIAGNOSIS — M4722 Other spondylosis with radiculopathy, cervical region: Secondary | ICD-10-CM | POA: Diagnosis not present

## 2015-06-18 DIAGNOSIS — M9903 Segmental and somatic dysfunction of lumbar region: Secondary | ICD-10-CM | POA: Diagnosis not present

## 2015-06-18 DIAGNOSIS — M50322 Other cervical disc degeneration at C5-C6 level: Secondary | ICD-10-CM | POA: Diagnosis not present

## 2015-06-18 DIAGNOSIS — M50222 Other cervical disc displacement at C5-C6 level: Secondary | ICD-10-CM | POA: Diagnosis not present

## 2015-06-18 DIAGNOSIS — M461 Sacroiliitis, not elsewhere classified: Secondary | ICD-10-CM | POA: Diagnosis not present

## 2015-06-18 DIAGNOSIS — M6240 Contracture of muscle, unspecified site: Secondary | ICD-10-CM | POA: Diagnosis not present

## 2015-06-18 DIAGNOSIS — M791 Myalgia: Secondary | ICD-10-CM | POA: Diagnosis not present

## 2015-06-18 DIAGNOSIS — M5137 Other intervertebral disc degeneration, lumbosacral region: Secondary | ICD-10-CM | POA: Diagnosis not present

## 2015-06-22 DIAGNOSIS — M9903 Segmental and somatic dysfunction of lumbar region: Secondary | ICD-10-CM | POA: Diagnosis not present

## 2015-06-22 DIAGNOSIS — M6240 Contracture of muscle, unspecified site: Secondary | ICD-10-CM | POA: Diagnosis not present

## 2015-06-22 DIAGNOSIS — M50322 Other cervical disc degeneration at C5-C6 level: Secondary | ICD-10-CM | POA: Diagnosis not present

## 2015-06-22 DIAGNOSIS — M4722 Other spondylosis with radiculopathy, cervical region: Secondary | ICD-10-CM | POA: Diagnosis not present

## 2015-06-22 DIAGNOSIS — M545 Low back pain: Secondary | ICD-10-CM | POA: Diagnosis not present

## 2015-06-22 DIAGNOSIS — M5137 Other intervertebral disc degeneration, lumbosacral region: Secondary | ICD-10-CM | POA: Diagnosis not present

## 2015-06-22 DIAGNOSIS — M461 Sacroiliitis, not elsewhere classified: Secondary | ICD-10-CM | POA: Diagnosis not present

## 2015-06-22 DIAGNOSIS — R293 Abnormal posture: Secondary | ICD-10-CM | POA: Diagnosis not present

## 2015-06-22 DIAGNOSIS — M9901 Segmental and somatic dysfunction of cervical region: Secondary | ICD-10-CM | POA: Diagnosis not present

## 2015-06-22 DIAGNOSIS — M791 Myalgia: Secondary | ICD-10-CM | POA: Diagnosis not present

## 2015-06-22 DIAGNOSIS — M50222 Other cervical disc displacement at C5-C6 level: Secondary | ICD-10-CM | POA: Diagnosis not present

## 2015-06-23 DIAGNOSIS — M0589 Other rheumatoid arthritis with rheumatoid factor of multiple sites: Secondary | ICD-10-CM | POA: Diagnosis not present

## 2015-06-23 DIAGNOSIS — M150 Primary generalized (osteo)arthritis: Secondary | ICD-10-CM | POA: Diagnosis not present

## 2015-06-23 DIAGNOSIS — Z79899 Other long term (current) drug therapy: Secondary | ICD-10-CM | POA: Diagnosis not present

## 2015-06-23 DIAGNOSIS — M25512 Pain in left shoulder: Secondary | ICD-10-CM | POA: Diagnosis not present

## 2015-06-25 DIAGNOSIS — R293 Abnormal posture: Secondary | ICD-10-CM | POA: Diagnosis not present

## 2015-06-25 DIAGNOSIS — M50322 Other cervical disc degeneration at C5-C6 level: Secondary | ICD-10-CM | POA: Diagnosis not present

## 2015-06-25 DIAGNOSIS — M545 Low back pain: Secondary | ICD-10-CM | POA: Diagnosis not present

## 2015-06-25 DIAGNOSIS — M6240 Contracture of muscle, unspecified site: Secondary | ICD-10-CM | POA: Diagnosis not present

## 2015-06-25 DIAGNOSIS — M50222 Other cervical disc displacement at C5-C6 level: Secondary | ICD-10-CM | POA: Diagnosis not present

## 2015-06-25 DIAGNOSIS — M9901 Segmental and somatic dysfunction of cervical region: Secondary | ICD-10-CM | POA: Diagnosis not present

## 2015-06-25 DIAGNOSIS — M791 Myalgia: Secondary | ICD-10-CM | POA: Diagnosis not present

## 2015-06-25 DIAGNOSIS — M461 Sacroiliitis, not elsewhere classified: Secondary | ICD-10-CM | POA: Diagnosis not present

## 2015-06-25 DIAGNOSIS — M9903 Segmental and somatic dysfunction of lumbar region: Secondary | ICD-10-CM | POA: Diagnosis not present

## 2015-06-25 DIAGNOSIS — M5137 Other intervertebral disc degeneration, lumbosacral region: Secondary | ICD-10-CM | POA: Diagnosis not present

## 2015-06-25 DIAGNOSIS — M4722 Other spondylosis with radiculopathy, cervical region: Secondary | ICD-10-CM | POA: Diagnosis not present

## 2015-06-29 DIAGNOSIS — M50322 Other cervical disc degeneration at C5-C6 level: Secondary | ICD-10-CM | POA: Diagnosis not present

## 2015-06-29 DIAGNOSIS — M9901 Segmental and somatic dysfunction of cervical region: Secondary | ICD-10-CM | POA: Diagnosis not present

## 2015-06-29 DIAGNOSIS — R293 Abnormal posture: Secondary | ICD-10-CM | POA: Diagnosis not present

## 2015-06-29 DIAGNOSIS — M5413 Radiculopathy, cervicothoracic region: Secondary | ICD-10-CM | POA: Diagnosis not present

## 2015-06-29 DIAGNOSIS — M6240 Contracture of muscle, unspecified site: Secondary | ICD-10-CM | POA: Diagnosis not present

## 2015-06-29 DIAGNOSIS — M5137 Other intervertebral disc degeneration, lumbosacral region: Secondary | ICD-10-CM | POA: Diagnosis not present

## 2015-06-29 DIAGNOSIS — M9903 Segmental and somatic dysfunction of lumbar region: Secondary | ICD-10-CM | POA: Diagnosis not present

## 2015-06-29 DIAGNOSIS — M5023 Other cervical disc displacement, cervicothoracic region: Secondary | ICD-10-CM | POA: Diagnosis not present

## 2015-07-02 DIAGNOSIS — M4722 Other spondylosis with radiculopathy, cervical region: Secondary | ICD-10-CM | POA: Diagnosis not present

## 2015-07-02 DIAGNOSIS — M6240 Contracture of muscle, unspecified site: Secondary | ICD-10-CM | POA: Diagnosis not present

## 2015-07-02 DIAGNOSIS — M50222 Other cervical disc displacement at C5-C6 level: Secondary | ICD-10-CM | POA: Diagnosis not present

## 2015-07-02 DIAGNOSIS — M9903 Segmental and somatic dysfunction of lumbar region: Secondary | ICD-10-CM | POA: Diagnosis not present

## 2015-07-02 DIAGNOSIS — M545 Low back pain: Secondary | ICD-10-CM | POA: Diagnosis not present

## 2015-07-02 DIAGNOSIS — M50322 Other cervical disc degeneration at C5-C6 level: Secondary | ICD-10-CM | POA: Diagnosis not present

## 2015-07-02 DIAGNOSIS — M461 Sacroiliitis, not elsewhere classified: Secondary | ICD-10-CM | POA: Diagnosis not present

## 2015-07-02 DIAGNOSIS — R293 Abnormal posture: Secondary | ICD-10-CM | POA: Diagnosis not present

## 2015-07-02 DIAGNOSIS — M9901 Segmental and somatic dysfunction of cervical region: Secondary | ICD-10-CM | POA: Diagnosis not present

## 2015-07-02 DIAGNOSIS — M5137 Other intervertebral disc degeneration, lumbosacral region: Secondary | ICD-10-CM | POA: Diagnosis not present

## 2015-07-02 DIAGNOSIS — M791 Myalgia: Secondary | ICD-10-CM | POA: Diagnosis not present

## 2015-07-06 DIAGNOSIS — M6240 Contracture of muscle, unspecified site: Secondary | ICD-10-CM | POA: Diagnosis not present

## 2015-07-06 DIAGNOSIS — M542 Cervicalgia: Secondary | ICD-10-CM | POA: Diagnosis not present

## 2015-07-06 DIAGNOSIS — M5137 Other intervertebral disc degeneration, lumbosacral region: Secondary | ICD-10-CM | POA: Diagnosis not present

## 2015-07-06 DIAGNOSIS — M791 Myalgia: Secondary | ICD-10-CM | POA: Diagnosis not present

## 2015-07-06 DIAGNOSIS — M50322 Other cervical disc degeneration at C5-C6 level: Secondary | ICD-10-CM | POA: Diagnosis not present

## 2015-07-06 DIAGNOSIS — M9901 Segmental and somatic dysfunction of cervical region: Secondary | ICD-10-CM | POA: Diagnosis not present

## 2015-07-06 DIAGNOSIS — M9903 Segmental and somatic dysfunction of lumbar region: Secondary | ICD-10-CM | POA: Diagnosis not present

## 2015-07-06 DIAGNOSIS — M4722 Other spondylosis with radiculopathy, cervical region: Secondary | ICD-10-CM | POA: Diagnosis not present

## 2015-07-06 DIAGNOSIS — M461 Sacroiliitis, not elsewhere classified: Secondary | ICD-10-CM | POA: Diagnosis not present

## 2015-07-06 DIAGNOSIS — R293 Abnormal posture: Secondary | ICD-10-CM | POA: Diagnosis not present

## 2015-07-06 DIAGNOSIS — M50222 Other cervical disc displacement at C5-C6 level: Secondary | ICD-10-CM | POA: Diagnosis not present

## 2015-07-09 DIAGNOSIS — M461 Sacroiliitis, not elsewhere classified: Secondary | ICD-10-CM | POA: Diagnosis not present

## 2015-07-09 DIAGNOSIS — M9903 Segmental and somatic dysfunction of lumbar region: Secondary | ICD-10-CM | POA: Diagnosis not present

## 2015-07-09 DIAGNOSIS — M5137 Other intervertebral disc degeneration, lumbosacral region: Secondary | ICD-10-CM | POA: Diagnosis not present

## 2015-07-09 DIAGNOSIS — M6240 Contracture of muscle, unspecified site: Secondary | ICD-10-CM | POA: Diagnosis not present

## 2015-07-09 DIAGNOSIS — M9901 Segmental and somatic dysfunction of cervical region: Secondary | ICD-10-CM | POA: Diagnosis not present

## 2015-07-09 DIAGNOSIS — M791 Myalgia: Secondary | ICD-10-CM | POA: Diagnosis not present

## 2015-07-09 DIAGNOSIS — M542 Cervicalgia: Secondary | ICD-10-CM | POA: Diagnosis not present

## 2015-07-09 DIAGNOSIS — M4722 Other spondylosis with radiculopathy, cervical region: Secondary | ICD-10-CM | POA: Diagnosis not present

## 2015-07-09 DIAGNOSIS — M50322 Other cervical disc degeneration at C5-C6 level: Secondary | ICD-10-CM | POA: Diagnosis not present

## 2015-07-09 DIAGNOSIS — R293 Abnormal posture: Secondary | ICD-10-CM | POA: Diagnosis not present

## 2015-07-09 DIAGNOSIS — M50222 Other cervical disc displacement at C5-C6 level: Secondary | ICD-10-CM | POA: Diagnosis not present

## 2015-07-13 DIAGNOSIS — M9903 Segmental and somatic dysfunction of lumbar region: Secondary | ICD-10-CM | POA: Diagnosis not present

## 2015-07-13 DIAGNOSIS — M9901 Segmental and somatic dysfunction of cervical region: Secondary | ICD-10-CM | POA: Diagnosis not present

## 2015-07-13 DIAGNOSIS — M50322 Other cervical disc degeneration at C5-C6 level: Secondary | ICD-10-CM | POA: Diagnosis not present

## 2015-07-13 DIAGNOSIS — M6240 Contracture of muscle, unspecified site: Secondary | ICD-10-CM | POA: Diagnosis not present

## 2015-07-13 DIAGNOSIS — M4722 Other spondylosis with radiculopathy, cervical region: Secondary | ICD-10-CM | POA: Diagnosis not present

## 2015-07-13 DIAGNOSIS — M5137 Other intervertebral disc degeneration, lumbosacral region: Secondary | ICD-10-CM | POA: Diagnosis not present

## 2015-07-13 DIAGNOSIS — R293 Abnormal posture: Secondary | ICD-10-CM | POA: Diagnosis not present

## 2015-07-13 DIAGNOSIS — M542 Cervicalgia: Secondary | ICD-10-CM | POA: Diagnosis not present

## 2015-07-13 DIAGNOSIS — M461 Sacroiliitis, not elsewhere classified: Secondary | ICD-10-CM | POA: Diagnosis not present

## 2015-07-13 DIAGNOSIS — M791 Myalgia: Secondary | ICD-10-CM | POA: Diagnosis not present

## 2015-07-13 DIAGNOSIS — M50222 Other cervical disc displacement at C5-C6 level: Secondary | ICD-10-CM | POA: Diagnosis not present

## 2015-07-14 DIAGNOSIS — E119 Type 2 diabetes mellitus without complications: Secondary | ICD-10-CM | POA: Diagnosis not present

## 2015-07-14 DIAGNOSIS — E785 Hyperlipidemia, unspecified: Secondary | ICD-10-CM | POA: Diagnosis not present

## 2015-07-14 DIAGNOSIS — Z79899 Other long term (current) drug therapy: Secondary | ICD-10-CM | POA: Diagnosis not present

## 2015-07-15 DIAGNOSIS — M4722 Other spondylosis with radiculopathy, cervical region: Secondary | ICD-10-CM | POA: Diagnosis not present

## 2015-07-15 DIAGNOSIS — M791 Myalgia: Secondary | ICD-10-CM | POA: Diagnosis not present

## 2015-07-15 DIAGNOSIS — M542 Cervicalgia: Secondary | ICD-10-CM | POA: Diagnosis not present

## 2015-07-15 DIAGNOSIS — M50222 Other cervical disc displacement at C5-C6 level: Secondary | ICD-10-CM | POA: Diagnosis not present

## 2015-07-15 DIAGNOSIS — M461 Sacroiliitis, not elsewhere classified: Secondary | ICD-10-CM | POA: Diagnosis not present

## 2015-07-16 DIAGNOSIS — M5137 Other intervertebral disc degeneration, lumbosacral region: Secondary | ICD-10-CM | POA: Diagnosis not present

## 2015-07-16 DIAGNOSIS — M6240 Contracture of muscle, unspecified site: Secondary | ICD-10-CM | POA: Diagnosis not present

## 2015-07-16 DIAGNOSIS — M50322 Other cervical disc degeneration at C5-C6 level: Secondary | ICD-10-CM | POA: Diagnosis not present

## 2015-07-16 DIAGNOSIS — M9901 Segmental and somatic dysfunction of cervical region: Secondary | ICD-10-CM | POA: Diagnosis not present

## 2015-07-16 DIAGNOSIS — M9903 Segmental and somatic dysfunction of lumbar region: Secondary | ICD-10-CM | POA: Diagnosis not present

## 2015-07-16 DIAGNOSIS — R293 Abnormal posture: Secondary | ICD-10-CM | POA: Diagnosis not present

## 2015-07-20 DIAGNOSIS — M50322 Other cervical disc degeneration at C5-C6 level: Secondary | ICD-10-CM | POA: Diagnosis not present

## 2015-07-20 DIAGNOSIS — M5137 Other intervertebral disc degeneration, lumbosacral region: Secondary | ICD-10-CM | POA: Diagnosis not present

## 2015-07-20 DIAGNOSIS — M9903 Segmental and somatic dysfunction of lumbar region: Secondary | ICD-10-CM | POA: Diagnosis not present

## 2015-07-20 DIAGNOSIS — R293 Abnormal posture: Secondary | ICD-10-CM | POA: Diagnosis not present

## 2015-07-20 DIAGNOSIS — R202 Paresthesia of skin: Secondary | ICD-10-CM | POA: Diagnosis not present

## 2015-07-20 DIAGNOSIS — M6240 Contracture of muscle, unspecified site: Secondary | ICD-10-CM | POA: Diagnosis not present

## 2015-07-20 DIAGNOSIS — M5417 Radiculopathy, lumbosacral region: Secondary | ICD-10-CM | POA: Diagnosis not present

## 2015-07-20 DIAGNOSIS — M9901 Segmental and somatic dysfunction of cervical region: Secondary | ICD-10-CM | POA: Diagnosis not present

## 2015-07-21 DIAGNOSIS — L851 Acquired keratosis [keratoderma] palmaris et plantaris: Secondary | ICD-10-CM | POA: Diagnosis not present

## 2015-07-21 DIAGNOSIS — E1342 Other specified diabetes mellitus with diabetic polyneuropathy: Secondary | ICD-10-CM | POA: Diagnosis not present

## 2015-07-21 DIAGNOSIS — E1129 Type 2 diabetes mellitus with other diabetic kidney complication: Secondary | ICD-10-CM | POA: Diagnosis not present

## 2015-07-21 DIAGNOSIS — B351 Tinea unguium: Secondary | ICD-10-CM | POA: Diagnosis not present

## 2015-07-21 DIAGNOSIS — F3340 Major depressive disorder, recurrent, in remission, unspecified: Secondary | ICD-10-CM | POA: Diagnosis not present

## 2015-07-21 DIAGNOSIS — Z6841 Body Mass Index (BMI) 40.0 and over, adult: Secondary | ICD-10-CM | POA: Diagnosis not present

## 2015-07-23 DIAGNOSIS — M50322 Other cervical disc degeneration at C5-C6 level: Secondary | ICD-10-CM | POA: Diagnosis not present

## 2015-07-23 DIAGNOSIS — M542 Cervicalgia: Secondary | ICD-10-CM | POA: Diagnosis not present

## 2015-07-23 DIAGNOSIS — M461 Sacroiliitis, not elsewhere classified: Secondary | ICD-10-CM | POA: Diagnosis not present

## 2015-07-23 DIAGNOSIS — M6240 Contracture of muscle, unspecified site: Secondary | ICD-10-CM | POA: Diagnosis not present

## 2015-07-23 DIAGNOSIS — M5137 Other intervertebral disc degeneration, lumbosacral region: Secondary | ICD-10-CM | POA: Diagnosis not present

## 2015-07-23 DIAGNOSIS — M9903 Segmental and somatic dysfunction of lumbar region: Secondary | ICD-10-CM | POA: Diagnosis not present

## 2015-07-23 DIAGNOSIS — M4722 Other spondylosis with radiculopathy, cervical region: Secondary | ICD-10-CM | POA: Diagnosis not present

## 2015-07-23 DIAGNOSIS — M791 Myalgia: Secondary | ICD-10-CM | POA: Diagnosis not present

## 2015-07-23 DIAGNOSIS — M50222 Other cervical disc displacement at C5-C6 level: Secondary | ICD-10-CM | POA: Diagnosis not present

## 2015-07-23 DIAGNOSIS — M9901 Segmental and somatic dysfunction of cervical region: Secondary | ICD-10-CM | POA: Diagnosis not present

## 2015-07-23 DIAGNOSIS — R293 Abnormal posture: Secondary | ICD-10-CM | POA: Diagnosis not present

## 2015-07-25 DIAGNOSIS — M5137 Other intervertebral disc degeneration, lumbosacral region: Secondary | ICD-10-CM | POA: Diagnosis not present

## 2015-07-25 DIAGNOSIS — M9901 Segmental and somatic dysfunction of cervical region: Secondary | ICD-10-CM | POA: Diagnosis not present

## 2015-07-25 DIAGNOSIS — M6240 Contracture of muscle, unspecified site: Secondary | ICD-10-CM | POA: Diagnosis not present

## 2015-07-25 DIAGNOSIS — R293 Abnormal posture: Secondary | ICD-10-CM | POA: Diagnosis not present

## 2015-07-25 DIAGNOSIS — M50322 Other cervical disc degeneration at C5-C6 level: Secondary | ICD-10-CM | POA: Diagnosis not present

## 2015-07-25 DIAGNOSIS — M9903 Segmental and somatic dysfunction of lumbar region: Secondary | ICD-10-CM | POA: Diagnosis not present

## 2015-07-27 DIAGNOSIS — M0589 Other rheumatoid arthritis with rheumatoid factor of multiple sites: Secondary | ICD-10-CM | POA: Diagnosis not present

## 2015-07-27 DIAGNOSIS — Z79899 Other long term (current) drug therapy: Secondary | ICD-10-CM | POA: Diagnosis not present

## 2015-07-28 DIAGNOSIS — M791 Myalgia: Secondary | ICD-10-CM | POA: Diagnosis not present

## 2015-07-28 DIAGNOSIS — M461 Sacroiliitis, not elsewhere classified: Secondary | ICD-10-CM | POA: Diagnosis not present

## 2015-07-28 DIAGNOSIS — M542 Cervicalgia: Secondary | ICD-10-CM | POA: Diagnosis not present

## 2015-07-28 DIAGNOSIS — M50222 Other cervical disc displacement at C5-C6 level: Secondary | ICD-10-CM | POA: Diagnosis not present

## 2015-07-28 DIAGNOSIS — M4722 Other spondylosis with radiculopathy, cervical region: Secondary | ICD-10-CM | POA: Diagnosis not present

## 2015-07-30 DIAGNOSIS — M50222 Other cervical disc displacement at C5-C6 level: Secondary | ICD-10-CM | POA: Diagnosis not present

## 2015-07-30 DIAGNOSIS — M461 Sacroiliitis, not elsewhere classified: Secondary | ICD-10-CM | POA: Diagnosis not present

## 2015-07-30 DIAGNOSIS — M4722 Other spondylosis with radiculopathy, cervical region: Secondary | ICD-10-CM | POA: Diagnosis not present

## 2015-07-30 DIAGNOSIS — M542 Cervicalgia: Secondary | ICD-10-CM | POA: Diagnosis not present

## 2015-07-30 DIAGNOSIS — M791 Myalgia: Secondary | ICD-10-CM | POA: Diagnosis not present

## 2015-08-03 DIAGNOSIS — M9901 Segmental and somatic dysfunction of cervical region: Secondary | ICD-10-CM | POA: Diagnosis not present

## 2015-08-03 DIAGNOSIS — M461 Sacroiliitis, not elsewhere classified: Secondary | ICD-10-CM | POA: Diagnosis not present

## 2015-08-03 DIAGNOSIS — M6240 Contracture of muscle, unspecified site: Secondary | ICD-10-CM | POA: Diagnosis not present

## 2015-08-03 DIAGNOSIS — M50322 Other cervical disc degeneration at C5-C6 level: Secondary | ICD-10-CM | POA: Diagnosis not present

## 2015-08-03 DIAGNOSIS — M791 Myalgia: Secondary | ICD-10-CM | POA: Diagnosis not present

## 2015-08-03 DIAGNOSIS — M5137 Other intervertebral disc degeneration, lumbosacral region: Secondary | ICD-10-CM | POA: Diagnosis not present

## 2015-08-03 DIAGNOSIS — M4722 Other spondylosis with radiculopathy, cervical region: Secondary | ICD-10-CM | POA: Diagnosis not present

## 2015-08-03 DIAGNOSIS — M50222 Other cervical disc displacement at C5-C6 level: Secondary | ICD-10-CM | POA: Diagnosis not present

## 2015-08-03 DIAGNOSIS — M9903 Segmental and somatic dysfunction of lumbar region: Secondary | ICD-10-CM | POA: Diagnosis not present

## 2015-08-03 DIAGNOSIS — R293 Abnormal posture: Secondary | ICD-10-CM | POA: Diagnosis not present

## 2015-08-03 DIAGNOSIS — M542 Cervicalgia: Secondary | ICD-10-CM | POA: Diagnosis not present

## 2015-09-08 DIAGNOSIS — M0589 Other rheumatoid arthritis with rheumatoid factor of multiple sites: Secondary | ICD-10-CM | POA: Diagnosis not present

## 2015-09-14 DIAGNOSIS — M25562 Pain in left knee: Secondary | ICD-10-CM | POA: Diagnosis not present

## 2015-09-14 DIAGNOSIS — M25561 Pain in right knee: Secondary | ICD-10-CM | POA: Diagnosis not present

## 2015-09-24 DIAGNOSIS — M25512 Pain in left shoulder: Secondary | ICD-10-CM | POA: Diagnosis not present

## 2015-09-24 DIAGNOSIS — M150 Primary generalized (osteo)arthritis: Secondary | ICD-10-CM | POA: Diagnosis not present

## 2015-09-24 DIAGNOSIS — Z79899 Other long term (current) drug therapy: Secondary | ICD-10-CM | POA: Diagnosis not present

## 2015-09-24 DIAGNOSIS — M0589 Other rheumatoid arthritis with rheumatoid factor of multiple sites: Secondary | ICD-10-CM | POA: Diagnosis not present

## 2015-10-06 DIAGNOSIS — E1342 Other specified diabetes mellitus with diabetic polyneuropathy: Secondary | ICD-10-CM | POA: Diagnosis not present

## 2015-10-06 DIAGNOSIS — B351 Tinea unguium: Secondary | ICD-10-CM | POA: Diagnosis not present

## 2015-10-06 DIAGNOSIS — L851 Acquired keratosis [keratoderma] palmaris et plantaris: Secondary | ICD-10-CM | POA: Diagnosis not present

## 2015-10-22 DIAGNOSIS — Z79899 Other long term (current) drug therapy: Secondary | ICD-10-CM | POA: Diagnosis not present

## 2015-10-22 DIAGNOSIS — M0589 Other rheumatoid arthritis with rheumatoid factor of multiple sites: Secondary | ICD-10-CM | POA: Diagnosis not present

## 2015-11-05 DIAGNOSIS — H43811 Vitreous degeneration, right eye: Secondary | ICD-10-CM | POA: Diagnosis not present

## 2015-11-11 ENCOUNTER — Other Ambulatory Visit: Payer: Self-pay | Admitting: Internal Medicine

## 2015-11-11 DIAGNOSIS — Z1231 Encounter for screening mammogram for malignant neoplasm of breast: Secondary | ICD-10-CM

## 2015-11-13 DIAGNOSIS — E1142 Type 2 diabetes mellitus with diabetic polyneuropathy: Secondary | ICD-10-CM | POA: Diagnosis not present

## 2015-11-13 DIAGNOSIS — L97511 Non-pressure chronic ulcer of other part of right foot limited to breakdown of skin: Secondary | ICD-10-CM | POA: Diagnosis not present

## 2015-11-13 DIAGNOSIS — E119 Type 2 diabetes mellitus without complications: Secondary | ICD-10-CM | POA: Diagnosis not present

## 2015-11-24 DIAGNOSIS — F419 Anxiety disorder, unspecified: Secondary | ICD-10-CM | POA: Diagnosis not present

## 2015-11-24 DIAGNOSIS — Z23 Encounter for immunization: Secondary | ICD-10-CM | POA: Diagnosis not present

## 2015-11-24 DIAGNOSIS — Z6841 Body Mass Index (BMI) 40.0 and over, adult: Secondary | ICD-10-CM | POA: Diagnosis not present

## 2015-11-24 DIAGNOSIS — E1149 Type 2 diabetes mellitus with other diabetic neurological complication: Secondary | ICD-10-CM | POA: Diagnosis not present

## 2015-11-28 ENCOUNTER — Other Ambulatory Visit: Payer: Self-pay | Admitting: Pulmonary Disease

## 2015-11-28 ENCOUNTER — Other Ambulatory Visit: Payer: Self-pay | Admitting: Adult Health

## 2015-12-01 DIAGNOSIS — E1142 Type 2 diabetes mellitus with diabetic polyneuropathy: Secondary | ICD-10-CM | POA: Diagnosis not present

## 2015-12-01 DIAGNOSIS — L97511 Non-pressure chronic ulcer of other part of right foot limited to breakdown of skin: Secondary | ICD-10-CM | POA: Diagnosis not present

## 2015-12-03 DIAGNOSIS — Z79899 Other long term (current) drug therapy: Secondary | ICD-10-CM | POA: Diagnosis not present

## 2015-12-03 DIAGNOSIS — M0589 Other rheumatoid arthritis with rheumatoid factor of multiple sites: Secondary | ICD-10-CM | POA: Diagnosis not present

## 2015-12-10 ENCOUNTER — Ambulatory Visit
Admission: RE | Admit: 2015-12-10 | Discharge: 2015-12-10 | Disposition: A | Discharge status: Home / Self Care | Payer: Medicare Other | Source: Ambulatory Visit | Attending: Internal Medicine | Admitting: Internal Medicine

## 2015-12-10 DIAGNOSIS — Z1231 Encounter for screening mammogram for malignant neoplasm of breast: Secondary | ICD-10-CM | POA: Diagnosis not present

## 2015-12-15 DIAGNOSIS — E1142 Type 2 diabetes mellitus with diabetic polyneuropathy: Secondary | ICD-10-CM | POA: Diagnosis not present

## 2015-12-15 DIAGNOSIS — L97511 Non-pressure chronic ulcer of other part of right foot limited to breakdown of skin: Secondary | ICD-10-CM | POA: Diagnosis not present

## 2015-12-22 DIAGNOSIS — E1342 Other specified diabetes mellitus with diabetic polyneuropathy: Secondary | ICD-10-CM | POA: Diagnosis not present

## 2015-12-22 DIAGNOSIS — B351 Tinea unguium: Secondary | ICD-10-CM | POA: Diagnosis not present

## 2015-12-22 DIAGNOSIS — L851 Acquired keratosis [keratoderma] palmaris et plantaris: Secondary | ICD-10-CM | POA: Diagnosis not present

## 2015-12-23 ENCOUNTER — Ambulatory Visit (INDEPENDENT_AMBULATORY_CARE_PROVIDER_SITE_OTHER): Payer: Medicare Other | Admitting: Pulmonary Disease

## 2015-12-23 ENCOUNTER — Encounter: Payer: Self-pay | Admitting: Pulmonary Disease

## 2015-12-23 VITALS — BP 136/72 | HR 95 | Ht 62.0 in | Wt 285.8 lb

## 2015-12-23 DIAGNOSIS — Z23 Encounter for immunization: Secondary | ICD-10-CM | POA: Diagnosis not present

## 2015-12-23 DIAGNOSIS — J449 Chronic obstructive pulmonary disease, unspecified: Secondary | ICD-10-CM | POA: Diagnosis not present

## 2015-12-23 DIAGNOSIS — Z9989 Dependence on other enabling machines and devices: Principal | ICD-10-CM

## 2015-12-23 DIAGNOSIS — J439 Emphysema, unspecified: Secondary | ICD-10-CM | POA: Diagnosis not present

## 2015-12-23 DIAGNOSIS — G4733 Obstructive sleep apnea (adult) (pediatric): Secondary | ICD-10-CM

## 2015-12-23 NOTE — Patient Instructions (Signed)
Flu shot today  Will have your CPAP setting changed to 12 cm H2O  Try changing advair to one puff daily >> if breathing okay after two weeks, then try stopping advair  Follow up in 6 months

## 2015-12-23 NOTE — Addendum Note (Signed)
Addended by: Virl Cagey on: 12/23/2015 11:29 AM   Modules accepted: Orders

## 2015-12-23 NOTE — Progress Notes (Signed)
Current Outpatient Prescriptions on File Prior to Visit  Medication Sig  . ADVAIR HFA 115-21 MCG/ACT inhaler USE 2 INHALATIONS TWICE A DAY  . Albiglutide 30 MG PEN Inject into the skin. Once weekly  . ALPRAZolam (XANAX) 0.5 MG tablet Take 0.25-0.5 mg by mouth 3 (three) times daily as needed for sleep or anxiety.  Marland Kitchen aspirin 81 MG tablet Take 81 mg by mouth daily.  . Calcium Carbonate-Vitamin D (CALCIUM 600+D) 600-400 MG-UNIT per tablet Take 1 tablet by mouth 2 (two) times daily.    . diclofenac-misoprostol (ARTHROTEC 75) 75-0.2 MG per tablet Take 1 tablet by mouth 2 (two) times daily.    . empagliflozin (JARDIANCE) 25 MG TABS tablet Take 25 mg by mouth daily.  . fexofenadine (ALLEGRA) 180 MG tablet Take 180 mg by mouth daily.    . fish oil-omega-3 fatty acids 1000 MG capsule Take 2 g by mouth every evening.   . folic acid (FOLVITE) 1 MG tablet Take 1 mg by mouth daily.  . furosemide (LASIX) 40 MG tablet Take 40 mg by mouth daily as needed for fluid. Fluid  . gabapentin (NEURONTIN) 600 MG tablet Take 600 mg by mouth at bedtime.  Marland Kitchen ibuprofen (ADVIL,MOTRIN) 200 MG tablet Take 400-600 mg by mouth every 8 (eight) hours as needed. Pain  . InFLIXimab (REMICADE IV) Inject into the vein. Every 8 weeks  . insulin aspart (NOVOLOG) 100 UNIT/ML injection Inject 10-15 Units into the skin 3 (three) times daily before meals. Inject 10Units subcutaneously every morning, 15 Units subcutaneously at lunchtime and 10 Units subcutaneously at bedtime as directed  . insulin glargine (LANTUS) 100 UNIT/ML injection Inject 80-100 Units into the skin 2 (two) times daily. Per patient 80 in am and 80 in pm.  . losartan (COZAAR) 100 MG tablet Take 100 mg by mouth daily.  . metFORMIN (GLUCOPHAGE-XR) 500 MG 24 hr tablet Take 2,000 mg by mouth daily.    . methocarbamol (ROBAXIN) 750 MG tablet Take 750 mg by mouth every 8 (eight) hours as needed. Muscle Spasms  . methotrexate (RHEUMATREX) 2.5 MG tablet Take 20 mg by mouth once a  week. Caution:Chemotherapy. Protect from light.  . Multiple Vitamins-Minerals (ICAPS MV PO) Take 1 tablet by mouth 2 (two) times daily.  Marland Kitchen omeprazole (PRILOSEC) 40 MG capsule TAKE 1 CAPSULE EVERY MORNING  . PROAIR HFA 108 (90 BASE) MCG/ACT inhaler INHALE 2 PUFFS INTO THE LUNGS EVERY 6 HOURS AS NEEDED FOR WHEEZING OR SHORTNESS OF BREATH  . simvastatin (ZOCOR) 40 MG tablet Take 40 mg by mouth daily with supper.   . tiotropium (SPIRIVA) 18 MCG inhalation capsule Place 18 mcg into inhaler and inhale daily.     No current facility-administered medications on file prior to visit.      Chief Complaint  Patient presents with  . Follow-up    CPAP seems to be working well. Wears nightly. Denies any issues with mask/pressure. DME: [facility] ; Pt notes some fluid retention. Pt states that for the last week she has been having increased SOB. Denies SOB.      Sleep tests PSG 09/02/07 >> AHI 9 CPAP 11/22/15 to 12/21/15 >> used on 26 of 30 nights with average 8 hrs 39 min.  Average AHI 0.9 with CPAP 14 cm H2O  Pulmonary tests PFT 09/18/07 >> FEV1 1.38 (61%), FEV1% 59, TLC 4.48 (95%), DLCO 80%, + BD  Past medical history DM, HLD, GERD, Asthma, HTN, RA, Depression  Past surgical hx, Medications, Allergies, Family hx, Social hx all  reviewed.  Vital Signs BP 136/72 (BP Location: Left Arm, Cuff Size: Normal)   Pulse 95   Ht 5\' 2"  (1.575 m)   Wt 285 lb 12.8 oz (129.6 kg)   SpO2 94%   BMI 52.27 kg/m   History of Present Illness [name] is a 65 y.o. female former smoker with OSA and COPD.  She got prevnar with PCP in August.  Sleeping well.  She has back up CPAP when she travels to [place].  No issues with mask fit.  She would like to try decreasing pressure setting.  Breathing okay.  Some cough and dyspnea with weather change.  No needing albuterol much.  Arthritis seems stable on current meds.  Physical Exam  General - No distress ENT - No sinus tenderness, no oral exudate, no LAN, MP  3 Cardiac - s1s2 regular, no murmur Chest - No wheeze/rales/dullness Back - No focal tenderness Abd - Soft, non-tender Ext - mild ankle edema Neuro - Normal strength Skin - No rashes Psych - normal mood, and behavior   Assessment/Plan  COPD with chronic bronchitis. - continue spiriva, and prn albuterol - will have her change advair to one puff daily >> if stable after two weeks, then she can try stopping advair - flu shot today  Obstructive sleep apnea. - She is compliant with therapy and reports benefit - will have her DME decrease CPAP from 14 to 12 cm H2O  Rheumatoid arthritis. - she is on remicade and MTX with Dr. Amil Amen    Patient Instructions  Flu shot today  Will have your CPAP setting changed to 12 cm H2O  Try changing advair to one puff daily >> if breathing okay after two weeks, then try stopping advair  Follow up in 6 months    [name], MD [place] [facility]Critical Care/Sleep Pager:  [phone] 12/23/2015, 10:26 AM

## 2015-12-29 DIAGNOSIS — M542 Cervicalgia: Secondary | ICD-10-CM | POA: Diagnosis not present

## 2015-12-31 DIAGNOSIS — M542 Cervicalgia: Secondary | ICD-10-CM | POA: Diagnosis not present

## 2016-01-01 DIAGNOSIS — L97511 Non-pressure chronic ulcer of other part of right foot limited to breakdown of skin: Secondary | ICD-10-CM | POA: Diagnosis not present

## 2016-01-01 DIAGNOSIS — E1142 Type 2 diabetes mellitus with diabetic polyneuropathy: Secondary | ICD-10-CM | POA: Diagnosis not present

## 2016-01-04 DIAGNOSIS — M542 Cervicalgia: Secondary | ICD-10-CM | POA: Diagnosis not present

## 2016-01-08 DIAGNOSIS — M542 Cervicalgia: Secondary | ICD-10-CM | POA: Diagnosis not present

## 2016-01-11 DIAGNOSIS — M542 Cervicalgia: Secondary | ICD-10-CM | POA: Diagnosis not present

## 2016-01-12 DIAGNOSIS — L97511 Non-pressure chronic ulcer of other part of right foot limited to breakdown of skin: Secondary | ICD-10-CM | POA: Diagnosis not present

## 2016-01-12 DIAGNOSIS — E1142 Type 2 diabetes mellitus with diabetic polyneuropathy: Secondary | ICD-10-CM | POA: Diagnosis not present

## 2016-01-14 DIAGNOSIS — M0589 Other rheumatoid arthritis with rheumatoid factor of multiple sites: Secondary | ICD-10-CM | POA: Diagnosis not present

## 2016-01-14 DIAGNOSIS — M542 Cervicalgia: Secondary | ICD-10-CM | POA: Diagnosis not present

## 2016-01-14 DIAGNOSIS — Z79899 Other long term (current) drug therapy: Secondary | ICD-10-CM | POA: Diagnosis not present

## 2016-01-20 DIAGNOSIS — M542 Cervicalgia: Secondary | ICD-10-CM | POA: Diagnosis not present

## 2016-01-25 DIAGNOSIS — M542 Cervicalgia: Secondary | ICD-10-CM | POA: Diagnosis not present

## 2016-01-26 DIAGNOSIS — Z79899 Other long term (current) drug therapy: Secondary | ICD-10-CM | POA: Diagnosis not present

## 2016-01-26 DIAGNOSIS — M150 Primary generalized (osteo)arthritis: Secondary | ICD-10-CM | POA: Diagnosis not present

## 2016-01-26 DIAGNOSIS — L97511 Non-pressure chronic ulcer of other part of right foot limited to breakdown of skin: Secondary | ICD-10-CM | POA: Diagnosis not present

## 2016-01-26 DIAGNOSIS — M67912 Unspecified disorder of synovium and tendon, left shoulder: Secondary | ICD-10-CM | POA: Diagnosis not present

## 2016-01-26 DIAGNOSIS — E1142 Type 2 diabetes mellitus with diabetic polyneuropathy: Secondary | ICD-10-CM | POA: Diagnosis not present

## 2016-01-26 DIAGNOSIS — L03031 Cellulitis of right toe: Secondary | ICD-10-CM | POA: Diagnosis not present

## 2016-01-26 DIAGNOSIS — M542 Cervicalgia: Secondary | ICD-10-CM | POA: Diagnosis not present

## 2016-01-26 DIAGNOSIS — M0589 Other rheumatoid arthritis with rheumatoid factor of multiple sites: Secondary | ICD-10-CM | POA: Diagnosis not present

## 2016-01-28 DIAGNOSIS — M542 Cervicalgia: Secondary | ICD-10-CM | POA: Diagnosis not present

## 2016-02-08 DIAGNOSIS — M542 Cervicalgia: Secondary | ICD-10-CM | POA: Diagnosis not present

## 2016-02-09 DIAGNOSIS — L97511 Non-pressure chronic ulcer of other part of right foot limited to breakdown of skin: Secondary | ICD-10-CM | POA: Diagnosis not present

## 2016-02-09 DIAGNOSIS — E1142 Type 2 diabetes mellitus with diabetic polyneuropathy: Secondary | ICD-10-CM | POA: Diagnosis not present

## 2016-02-11 DIAGNOSIS — M542 Cervicalgia: Secondary | ICD-10-CM | POA: Diagnosis not present

## 2016-02-15 DIAGNOSIS — M542 Cervicalgia: Secondary | ICD-10-CM | POA: Diagnosis not present

## 2016-02-16 DIAGNOSIS — E1142 Type 2 diabetes mellitus with diabetic polyneuropathy: Secondary | ICD-10-CM | POA: Diagnosis not present

## 2016-02-16 DIAGNOSIS — L97511 Non-pressure chronic ulcer of other part of right foot limited to breakdown of skin: Secondary | ICD-10-CM | POA: Diagnosis not present

## 2016-02-29 DIAGNOSIS — J208 Acute bronchitis due to other specified organisms: Secondary | ICD-10-CM | POA: Diagnosis not present

## 2016-03-01 DIAGNOSIS — L97511 Non-pressure chronic ulcer of other part of right foot limited to breakdown of skin: Secondary | ICD-10-CM | POA: Diagnosis not present

## 2016-03-01 DIAGNOSIS — E1342 Other specified diabetes mellitus with diabetic polyneuropathy: Secondary | ICD-10-CM | POA: Diagnosis not present

## 2016-03-15 DIAGNOSIS — M0589 Other rheumatoid arthritis with rheumatoid factor of multiple sites: Secondary | ICD-10-CM | POA: Diagnosis not present

## 2016-03-16 ENCOUNTER — Encounter (HOSPITAL_BASED_OUTPATIENT_CLINIC_OR_DEPARTMENT_OTHER): Payer: Medicare Other | Attending: Surgery

## 2016-03-16 ENCOUNTER — Other Ambulatory Visit: Payer: Self-pay | Admitting: Surgery

## 2016-03-16 ENCOUNTER — Ambulatory Visit
Admission: RE | Admit: 2016-03-16 | Discharge: 2016-03-16 | Disposition: A | Discharge status: Home / Self Care | Payer: Medicare Other | Source: Ambulatory Visit | Attending: Surgery | Admitting: Surgery

## 2016-03-16 DIAGNOSIS — Z794 Long term (current) use of insulin: Secondary | ICD-10-CM | POA: Insufficient documentation

## 2016-03-16 DIAGNOSIS — I10 Essential (primary) hypertension: Secondary | ICD-10-CM | POA: Insufficient documentation

## 2016-03-16 DIAGNOSIS — L97519 Non-pressure chronic ulcer of other part of right foot with unspecified severity: Principal | ICD-10-CM

## 2016-03-16 DIAGNOSIS — Z7951 Long term (current) use of inhaled steroids: Secondary | ICD-10-CM | POA: Diagnosis not present

## 2016-03-16 DIAGNOSIS — G4733 Obstructive sleep apnea (adult) (pediatric): Secondary | ICD-10-CM | POA: Diagnosis not present

## 2016-03-16 DIAGNOSIS — Z87891 Personal history of nicotine dependence: Secondary | ICD-10-CM | POA: Insufficient documentation

## 2016-03-16 DIAGNOSIS — E785 Hyperlipidemia, unspecified: Secondary | ICD-10-CM | POA: Insufficient documentation

## 2016-03-16 DIAGNOSIS — Z7982 Long term (current) use of aspirin: Secondary | ICD-10-CM | POA: Diagnosis not present

## 2016-03-16 DIAGNOSIS — E1140 Type 2 diabetes mellitus with diabetic neuropathy, unspecified: Secondary | ICD-10-CM | POA: Diagnosis not present

## 2016-03-16 DIAGNOSIS — E11621 Type 2 diabetes mellitus with foot ulcer: Secondary | ICD-10-CM | POA: Diagnosis not present

## 2016-03-16 DIAGNOSIS — L97512 Non-pressure chronic ulcer of other part of right foot with fat layer exposed: Secondary | ICD-10-CM | POA: Diagnosis not present

## 2016-03-16 DIAGNOSIS — Z96651 Presence of right artificial knee joint: Secondary | ICD-10-CM | POA: Insufficient documentation

## 2016-03-16 DIAGNOSIS — E1136 Type 2 diabetes mellitus with diabetic cataract: Secondary | ICD-10-CM | POA: Diagnosis not present

## 2016-03-16 DIAGNOSIS — L84 Corns and callosities: Secondary | ICD-10-CM | POA: Insufficient documentation

## 2016-03-16 DIAGNOSIS — S91101A Unspecified open wound of right great toe without damage to nail, initial encounter: Secondary | ICD-10-CM | POA: Diagnosis not present

## 2016-03-16 DIAGNOSIS — Z6841 Body Mass Index (BMI) 40.0 and over, adult: Secondary | ICD-10-CM | POA: Diagnosis not present

## 2016-03-16 DIAGNOSIS — Z791 Long term (current) use of non-steroidal anti-inflammatories (NSAID): Secondary | ICD-10-CM | POA: Diagnosis not present

## 2016-03-16 DIAGNOSIS — Z79899 Other long term (current) drug therapy: Secondary | ICD-10-CM | POA: Diagnosis not present

## 2016-03-16 DIAGNOSIS — J449 Chronic obstructive pulmonary disease, unspecified: Secondary | ICD-10-CM | POA: Diagnosis not present

## 2016-03-23 DIAGNOSIS — L84 Corns and callosities: Secondary | ICD-10-CM | POA: Diagnosis not present

## 2016-03-23 DIAGNOSIS — Z87891 Personal history of nicotine dependence: Secondary | ICD-10-CM | POA: Diagnosis not present

## 2016-03-23 DIAGNOSIS — L97512 Non-pressure chronic ulcer of other part of right foot with fat layer exposed: Secondary | ICD-10-CM | POA: Diagnosis not present

## 2016-03-23 DIAGNOSIS — J449 Chronic obstructive pulmonary disease, unspecified: Secondary | ICD-10-CM | POA: Diagnosis not present

## 2016-03-23 DIAGNOSIS — E11621 Type 2 diabetes mellitus with foot ulcer: Secondary | ICD-10-CM | POA: Diagnosis not present

## 2016-03-23 DIAGNOSIS — G4733 Obstructive sleep apnea (adult) (pediatric): Secondary | ICD-10-CM | POA: Diagnosis not present

## 2016-03-24 ENCOUNTER — Other Ambulatory Visit: Payer: Self-pay | Admitting: Surgery

## 2016-03-24 DIAGNOSIS — M869 Osteomyelitis, unspecified: Principal | ICD-10-CM

## 2016-03-24 DIAGNOSIS — L97509 Non-pressure chronic ulcer of other part of unspecified foot with unspecified severity: Principal | ICD-10-CM

## 2016-03-24 DIAGNOSIS — E11621 Type 2 diabetes mellitus with foot ulcer: Secondary | ICD-10-CM

## 2016-03-24 DIAGNOSIS — E1169 Type 2 diabetes mellitus with other specified complication: Principal | ICD-10-CM

## 2016-03-29 ENCOUNTER — Other Ambulatory Visit: Payer: Self-pay | Admitting: Physician Assistant

## 2016-03-29 DIAGNOSIS — B078 Other viral warts: Secondary | ICD-10-CM | POA: Diagnosis not present

## 2016-03-29 DIAGNOSIS — L309 Dermatitis, unspecified: Secondary | ICD-10-CM | POA: Diagnosis not present

## 2016-03-29 DIAGNOSIS — J449 Chronic obstructive pulmonary disease, unspecified: Secondary | ICD-10-CM | POA: Diagnosis not present

## 2016-03-29 DIAGNOSIS — G4733 Obstructive sleep apnea (adult) (pediatric): Secondary | ICD-10-CM | POA: Diagnosis not present

## 2016-03-29 DIAGNOSIS — D492 Neoplasm of unspecified behavior of bone, soft tissue, and skin: Secondary | ICD-10-CM | POA: Diagnosis not present

## 2016-03-29 DIAGNOSIS — D229 Melanocytic nevi, unspecified: Secondary | ICD-10-CM | POA: Diagnosis not present

## 2016-03-29 DIAGNOSIS — Z87891 Personal history of nicotine dependence: Secondary | ICD-10-CM | POA: Diagnosis not present

## 2016-03-29 DIAGNOSIS — L84 Corns and callosities: Secondary | ICD-10-CM | POA: Diagnosis not present

## 2016-03-29 DIAGNOSIS — L97512 Non-pressure chronic ulcer of other part of right foot with fat layer exposed: Secondary | ICD-10-CM | POA: Diagnosis not present

## 2016-03-29 DIAGNOSIS — E11621 Type 2 diabetes mellitus with foot ulcer: Secondary | ICD-10-CM | POA: Diagnosis not present

## 2016-03-30 ENCOUNTER — Ambulatory Visit
Admission: RE | Admit: 2016-03-30 | Discharge: 2016-03-30 | Disposition: A | Discharge status: Home / Self Care | Payer: Medicare Other | Source: Ambulatory Visit | Attending: Surgery | Admitting: Surgery

## 2016-03-30 DIAGNOSIS — L97509 Non-pressure chronic ulcer of other part of unspecified foot with unspecified severity: Principal | ICD-10-CM

## 2016-03-30 DIAGNOSIS — E1169 Type 2 diabetes mellitus with other specified complication: Principal | ICD-10-CM

## 2016-03-30 DIAGNOSIS — E11621 Type 2 diabetes mellitus with foot ulcer: Secondary | ICD-10-CM

## 2016-03-30 DIAGNOSIS — S91101A Unspecified open wound of right great toe without damage to nail, initial encounter: Secondary | ICD-10-CM | POA: Diagnosis not present

## 2016-03-30 DIAGNOSIS — M869 Osteomyelitis, unspecified: Principal | ICD-10-CM

## 2016-03-30 MED ORDER — GADOBENATE DIMEGLUMINE 529 MG/ML IV SOLN
20.0000 mL | Freq: Once | INTRAVENOUS | Status: AC | PRN
  Administered 2016-03-30: 20 mL via INTRAVENOUS

## 2016-04-01 DIAGNOSIS — E1142 Type 2 diabetes mellitus with diabetic polyneuropathy: Secondary | ICD-10-CM | POA: Diagnosis not present

## 2016-04-05 ENCOUNTER — Ambulatory Visit (HOSPITAL_COMMUNITY)
Admission: RE | Admit: 2016-04-05 | Discharge: 2016-04-05 | Disposition: A | Discharge status: Home / Self Care | Payer: Medicare Other | Source: Ambulatory Visit | Attending: Surgery | Admitting: Surgery

## 2016-04-05 ENCOUNTER — Other Ambulatory Visit: Payer: Self-pay | Admitting: Surgery

## 2016-04-05 ENCOUNTER — Other Ambulatory Visit (HOSPITAL_COMMUNITY): Payer: Self-pay | Admitting: Surgery

## 2016-04-05 DIAGNOSIS — M86371 Chronic multifocal osteomyelitis, right ankle and foot: Secondary | ICD-10-CM | POA: Diagnosis not present

## 2016-04-05 DIAGNOSIS — Z01810 Encounter for preprocedural cardiovascular examination: Secondary | ICD-10-CM | POA: Diagnosis not present

## 2016-04-05 DIAGNOSIS — E11621 Type 2 diabetes mellitus with foot ulcer: Secondary | ICD-10-CM | POA: Diagnosis not present

## 2016-04-05 DIAGNOSIS — Z01818 Encounter for other preprocedural examination: Secondary | ICD-10-CM | POA: Diagnosis not present

## 2016-04-05 DIAGNOSIS — J449 Chronic obstructive pulmonary disease, unspecified: Secondary | ICD-10-CM | POA: Diagnosis not present

## 2016-04-05 DIAGNOSIS — Z9289 Personal history of other medical treatment: Secondary | ICD-10-CM

## 2016-04-05 DIAGNOSIS — L84 Corns and callosities: Secondary | ICD-10-CM | POA: Diagnosis not present

## 2016-04-05 DIAGNOSIS — Q249 Congenital malformation of heart, unspecified: Secondary | ICD-10-CM

## 2016-04-05 DIAGNOSIS — Z008 Encounter for other general examination: Secondary | ICD-10-CM

## 2016-04-05 DIAGNOSIS — G4733 Obstructive sleep apnea (adult) (pediatric): Secondary | ICD-10-CM | POA: Diagnosis not present

## 2016-04-05 DIAGNOSIS — Z87891 Personal history of nicotine dependence: Secondary | ICD-10-CM | POA: Diagnosis not present

## 2016-04-05 DIAGNOSIS — L97512 Non-pressure chronic ulcer of other part of right foot with fat layer exposed: Secondary | ICD-10-CM | POA: Diagnosis not present

## 2016-04-12 ENCOUNTER — Encounter (HOSPITAL_BASED_OUTPATIENT_CLINIC_OR_DEPARTMENT_OTHER): Payer: Medicare Other | Attending: Surgery

## 2016-04-12 DIAGNOSIS — Z87891 Personal history of nicotine dependence: Secondary | ICD-10-CM | POA: Diagnosis not present

## 2016-04-12 DIAGNOSIS — G4730 Sleep apnea, unspecified: Secondary | ICD-10-CM | POA: Insufficient documentation

## 2016-04-12 DIAGNOSIS — L97511 Non-pressure chronic ulcer of other part of right foot limited to breakdown of skin: Secondary | ICD-10-CM | POA: Diagnosis not present

## 2016-04-12 DIAGNOSIS — E11621 Type 2 diabetes mellitus with foot ulcer: Secondary | ICD-10-CM | POA: Diagnosis not present

## 2016-04-12 DIAGNOSIS — E1140 Type 2 diabetes mellitus with diabetic neuropathy, unspecified: Secondary | ICD-10-CM | POA: Diagnosis not present

## 2016-04-12 DIAGNOSIS — J449 Chronic obstructive pulmonary disease, unspecified: Secondary | ICD-10-CM | POA: Diagnosis not present

## 2016-04-12 DIAGNOSIS — I10 Essential (primary) hypertension: Secondary | ICD-10-CM | POA: Insufficient documentation

## 2016-04-12 DIAGNOSIS — L84 Corns and callosities: Secondary | ICD-10-CM | POA: Diagnosis not present

## 2016-04-12 DIAGNOSIS — Z6841 Body Mass Index (BMI) 40.0 and over, adult: Secondary | ICD-10-CM | POA: Insufficient documentation

## 2016-04-12 DIAGNOSIS — M86371 Chronic multifocal osteomyelitis, right ankle and foot: Secondary | ICD-10-CM | POA: Diagnosis not present

## 2016-04-15 ENCOUNTER — Ambulatory Visit (HOSPITAL_COMMUNITY)
Admission: RE | Admit: 2016-04-15 | Discharge: 2016-04-15 | Disposition: A | Discharge status: Home / Self Care | Payer: Medicare Other | Source: Ambulatory Visit | Attending: Podiatry | Admitting: Podiatry

## 2016-04-15 ENCOUNTER — Encounter (HOSPITAL_COMMUNITY)
Admission: RE | Admit: 2016-04-15 | Discharge: 2016-04-15 | Disposition: A | Discharge status: Home / Self Care | Payer: Medicare Other | Source: Ambulatory Visit | Attending: Podiatry | Admitting: Podiatry

## 2016-04-15 ENCOUNTER — Encounter (HOSPITAL_COMMUNITY): Payer: Self-pay

## 2016-04-15 ENCOUNTER — Other Ambulatory Visit: Payer: Self-pay | Admitting: Podiatry

## 2016-04-15 DIAGNOSIS — S63041A Subluxation of carpometacarpal joint of right thumb, initial encounter: Secondary | ICD-10-CM | POA: Diagnosis not present

## 2016-04-15 DIAGNOSIS — M869 Osteomyelitis, unspecified: Secondary | ICD-10-CM | POA: Diagnosis not present

## 2016-04-15 DIAGNOSIS — E1142 Type 2 diabetes mellitus with diabetic polyneuropathy: Secondary | ICD-10-CM | POA: Diagnosis present

## 2016-04-15 DIAGNOSIS — M069 Rheumatoid arthritis, unspecified: Secondary | ICD-10-CM | POA: Insufficient documentation

## 2016-04-15 LAB — CBC
HEMATOCRIT: 47.7 % — AB (ref 36.0–46.0)
HEMOGLOBIN: 14.9 g/dL (ref 12.0–15.0)
MCH: 26.8 pg (ref 26.0–34.0)
MCHC: 31.2 g/dL (ref 30.0–36.0)
MCV: 85.9 fL (ref 78.0–100.0)
Platelets: 208 10*3/uL (ref 150–400)
RBC: 5.55 MIL/uL — ABNORMAL HIGH (ref 3.87–5.11)
RDW: 17.2 % — AB (ref 11.5–15.5)
WBC: 8.3 10*3/uL (ref 4.0–10.5)

## 2016-04-15 LAB — BASIC METABOLIC PANEL
Anion gap: 10 (ref 5–15)
BUN: 22 mg/dL — ABNORMAL HIGH (ref 6–20)
CALCIUM: 9.5 mg/dL (ref 8.9–10.3)
CHLORIDE: 101 mmol/L (ref 101–111)
CO2: 24 mmol/L (ref 22–32)
CREATININE: 1.1 mg/dL — AB (ref 0.44–1.00)
GFR calc Af Amer: 60 mL/min — ABNORMAL LOW (ref >60)
GFR calc non Af Amer: 52 mL/min — ABNORMAL LOW (ref >60)
GLUCOSE: 150 mg/dL — AB (ref 65–99)
Potassium: 3.8 mmol/L (ref 3.5–5.1)
Sodium: 135 mmol/L (ref 135–145)

## 2016-04-15 NOTE — Patient Instructions (Signed)
[name]  04/15/2016     '@PREFPERIOPPHARMACY' @   Your procedure is scheduled on  04/19/2016   Report to [facility] at  23 A.M.  Call this number if you have problems the morning of surgery:  [phone]   Remember:  Do not eat food or drink liquids after midnight.  Take these medicines the morning of surgery with A SIP OF WATER  Xanax, arthrotec,allegra, neurontin, cozaar, robaxin, prilosec. Take your inhalers before you come and bring your rescue inhaler with you. Take 1/2 of your usual night time insulin dosage the night before your surgery. DO NOT take any medicines for diabetes the morning of surgery.   Do not wear jewelry, make-up or nail polish.  Do not wear lotions, powders, or perfumes, or deoderant.  Do not shave 48 hours prior to surgery.  Men may shave face and neck.  Do not bring valuables to the hospital.  [facility] is not responsible for any belongings or valuables.  Contacts, dentures or bridgework may not be worn into surgery.  Leave your suitcase in the car.  After surgery it may be brought to your room.  For patients admitted to the hospital, discharge time will be determined by your treatment team.  Patients discharged the day of surgery will not be allowed to drive home.   Name and phone number of your driver:   family Special instructions:  none Please read over the following fact sheets that you were given. Anesthesia Post-op Instructions and Care and Recovery After Surgery      Open Biopsy of the Bone, Care After Introduction Refer to this sheet in the next few weeks. These instructions provide you with information about caring for yourself after your procedure. Your health care provider may also give you more specific instructions. Your treatment has been planned according to current medical practices, but problems sometimes occur. Call your health care provider if you have any problems or questions after your procedure. What can I  expect after the procedure? After your procedure, it is common to have soreness or tenderness at the biopsy site. Follow these instructions at home:  Take over-the-counter and prescription medicines only as told by your health care provider.  Bathe and shower as told by your health care provider. Do not take baths, swim, or use a hot tub until your health care provider approves.  There are many ways to close and cover an incision. For example, an incision can be closed with stitches (sutures), skin glue, or adhesive strips. Follow instructions from your health care provider about:  How to take care of your incision.  When and how you should change your bandage (dressing).  When you should remove your dressing.  Removing whatever was used to close your incision.  Check your incision area every day for signs of infection. Watch for:  Redness, swelling, or worsening pain.  Fluid, blood, or pus.  Return to your normal activities as told by your health care provider.  Keep all follow-up visits as told by your health care provider. This is important. Contact a health care provider if:  You have redness, swelling, or worsening pain at the site of your incision.  You have fluid, blood, or pus coming from your incision site.  You have a fever.  You develop persistent nausea or vomiting.  The edges of your incision break open after the sutures or staples have been removed. Get help right away if:  You develop a rash.  You have difficulty breathing. This information is not intended to replace advice given to you by your health care provider. Make sure you discuss any questions you have with your health care provider. Document Released: 10/15/2004 Document Revised: 09/03/2015 Document Reviewed: 05/05/2014  2017 Elsevier Open Biopsy of the Bone Introduction A bone biopsy is a procedure in which a small sample of bone is removed. In an open biopsy, the sample is removed through an  incision. Then, the bone sample is looked at under a microscope to check for abnormalities. The sample is usually taken from a bone that is close to the skin. This procedure may be done to check for various problems with the bone. You may need this procedure if imaging tests or blood tests have indicated a possible problem. This procedure may be done to help determine if a bone tumor is cancerous (malignant). A bone biopsy can help to diagnose problems such as:  Tumors of the bone (sarcomas) and bone marrow (multiple myeloma).  Bone that forms abnormally (Paget disease).  Noncancerous (benign) bone cysts.  Bony growths.  An infection in the bone (osteomyelitis). Tell a health care provider about:  Any allergies you have.  All medicines you are taking, including vitamins, herbs, eye drops, creams, and over-the-counter medicines.  Any problems you or family members have had with anesthetic medicines.  Any blood disorders you have.  Any surgeries you have had.  Any medical conditions you have. What are the risks? Generally, this is a safe procedure. However, problems may occur, including:  Excessive bleeding.  Infection.  Injury to surrounding tissue. What happens before the procedure?  Ask your health care provider about:  Changing or stopping your regular medicines. This is especially important if you are taking diabetes medicines or blood thinners.  Taking medicines such as aspirin and ibuprofen. These medicines can thin your blood. Do not take these medicines before your procedure if your health care provider instructs you not to.  Follow instructions from your health care provider about eating or drinking restrictions.  Plan to have someone take you home after the procedure.  If you go home right after the procedure, plan to have someone with you for 24 hours.  Ask your health care provider how your incision site will be marked or identified.  You may be given  antibiotic medicine to help prevent infection. What happens during the procedure?  To reduce your risk of infection:  Your health care team will wash or sanitize their hands.  Your skin will be washed with soap.  An IV tube may be inserted into one of your veins.  You will be given one of the following:  A medicine to make you fall asleep (general anesthetic).  A medicine that is injected into an area of your body to numb everything below the injection site (regional anesthetic).  An incision will be made over the biopsy site. A sample of bone tissue will be removed using surgical tools.  The incision will be closed with stitches (sutures).  A bandage (dressing) will be placed over the incision. The procedure may vary among health care providers and hospitals. What happens after the procedure?  Your blood pressure, heart rate, breathing rate, and blood oxygen level will be monitored often until the medicines you were given have worn off. This information is not intended to replace advice given to you by your health care provider. Make sure you discuss any questions you have with your  health care provider. Document Released: 02/05/2004 Document Revised: 09/03/2015 Document Reviewed: 05/05/2014  2017 Elsevier PATIENT INSTRUCTIONS POST-ANESTHESIA  IMMEDIATELY FOLLOWING SURGERY:  Do not drive or operate machinery for the first twenty four hours after surgery.  Do not make any important decisions for twenty four hours after surgery or while taking narcotic pain medications or sedatives.  If you develop intractable nausea and vomiting or a severe headache please notify your doctor immediately.  FOLLOW-UP:  Please make an appointment with your surgeon as instructed. You do not need to follow up with anesthesia unless specifically instructed to do so.  WOUND CARE INSTRUCTIONS (if applicable):  Keep a dry clean dressing on the anesthesia/puncture wound site if there is drainage.  Once the  wound has quit draining you may leave it open to air.  Generally you should leave the bandage intact for twenty four hours unless there is drainage.  If the epidural site drains for more than 36-48 hours please call the anesthesia department.  QUESTIONS?:  Please feel free to call your physician or the hospital operator if you have any questions, and they will be happy to assist you.

## 2016-04-18 DIAGNOSIS — E119 Type 2 diabetes mellitus without complications: Secondary | ICD-10-CM | POA: Diagnosis not present

## 2016-04-19 ENCOUNTER — Ambulatory Visit (HOSPITAL_COMMUNITY): Payer: Medicare Other

## 2016-04-19 ENCOUNTER — Ambulatory Visit (HOSPITAL_COMMUNITY): Payer: Medicare Other | Admitting: Anesthesiology

## 2016-04-19 ENCOUNTER — Ambulatory Visit (HOSPITAL_COMMUNITY)
Admission: RE | Admit: 2016-04-19 | Discharge: 2016-04-19 | Disposition: A | Discharge status: Home / Self Care | Payer: Medicare Other | Source: Ambulatory Visit | Attending: Podiatry | Admitting: Podiatry

## 2016-04-19 ENCOUNTER — Encounter (HOSPITAL_COMMUNITY)
Admission: RE | Disposition: A | Discharge status: Home / Self Care | Payer: Self-pay | Source: Ambulatory Visit | Attending: Podiatry

## 2016-04-19 ENCOUNTER — Encounter (HOSPITAL_COMMUNITY): Payer: Self-pay | Admitting: *Deleted

## 2016-04-19 DIAGNOSIS — Z881 Allergy status to other antibiotic agents status: Secondary | ICD-10-CM | POA: Diagnosis not present

## 2016-04-19 DIAGNOSIS — M069 Rheumatoid arthritis, unspecified: Secondary | ICD-10-CM | POA: Insufficient documentation

## 2016-04-19 DIAGNOSIS — Z7982 Long term (current) use of aspirin: Secondary | ICD-10-CM | POA: Diagnosis not present

## 2016-04-19 DIAGNOSIS — Z836 Family history of other diseases of the respiratory system: Secondary | ICD-10-CM | POA: Insufficient documentation

## 2016-04-19 DIAGNOSIS — Z794 Long term (current) use of insulin: Secondary | ICD-10-CM | POA: Diagnosis not present

## 2016-04-19 DIAGNOSIS — Z833 Family history of diabetes mellitus: Secondary | ICD-10-CM | POA: Insufficient documentation

## 2016-04-19 DIAGNOSIS — Z79899 Other long term (current) drug therapy: Secondary | ICD-10-CM | POA: Diagnosis not present

## 2016-04-19 DIAGNOSIS — M869 Osteomyelitis, unspecified: Secondary | ICD-10-CM | POA: Diagnosis not present

## 2016-04-19 DIAGNOSIS — E785 Hyperlipidemia, unspecified: Secondary | ICD-10-CM | POA: Insufficient documentation

## 2016-04-19 DIAGNOSIS — J45909 Unspecified asthma, uncomplicated: Secondary | ICD-10-CM | POA: Diagnosis not present

## 2016-04-19 DIAGNOSIS — L97519 Non-pressure chronic ulcer of other part of right foot with unspecified severity: Secondary | ICD-10-CM | POA: Diagnosis not present

## 2016-04-19 DIAGNOSIS — Z8349 Family history of other endocrine, nutritional and metabolic diseases: Secondary | ICD-10-CM | POA: Diagnosis not present

## 2016-04-19 DIAGNOSIS — E11621 Type 2 diabetes mellitus with foot ulcer: Secondary | ICD-10-CM | POA: Diagnosis not present

## 2016-04-19 DIAGNOSIS — Z96653 Presence of artificial knee joint, bilateral: Secondary | ICD-10-CM | POA: Insufficient documentation

## 2016-04-19 DIAGNOSIS — E1342 Other specified diabetes mellitus with diabetic polyneuropathy: Secondary | ICD-10-CM | POA: Diagnosis not present

## 2016-04-19 DIAGNOSIS — Z885 Allergy status to narcotic agent status: Secondary | ICD-10-CM | POA: Diagnosis not present

## 2016-04-19 DIAGNOSIS — E1169 Type 2 diabetes mellitus with other specified complication: Secondary | ICD-10-CM | POA: Diagnosis not present

## 2016-04-19 DIAGNOSIS — M06071 Rheumatoid arthritis without rheumatoid factor, right ankle and foot: Secondary | ICD-10-CM | POA: Diagnosis not present

## 2016-04-19 DIAGNOSIS — Z9049 Acquired absence of other specified parts of digestive tract: Secondary | ICD-10-CM | POA: Insufficient documentation

## 2016-04-19 DIAGNOSIS — E1142 Type 2 diabetes mellitus with diabetic polyneuropathy: Secondary | ICD-10-CM | POA: Insufficient documentation

## 2016-04-19 DIAGNOSIS — Z79891 Long term (current) use of opiate analgesic: Secondary | ICD-10-CM | POA: Insufficient documentation

## 2016-04-19 DIAGNOSIS — M898X7 Other specified disorders of bone, ankle and foot: Secondary | ICD-10-CM | POA: Insufficient documentation

## 2016-04-19 DIAGNOSIS — Z90710 Acquired absence of both cervix and uterus: Secondary | ICD-10-CM | POA: Insufficient documentation

## 2016-04-19 DIAGNOSIS — Z8249 Family history of ischemic heart disease and other diseases of the circulatory system: Secondary | ICD-10-CM | POA: Diagnosis not present

## 2016-04-19 DIAGNOSIS — I10 Essential (primary) hypertension: Secondary | ICD-10-CM | POA: Insufficient documentation

## 2016-04-19 DIAGNOSIS — Z801 Family history of malignant neoplasm of trachea, bronchus and lung: Secondary | ICD-10-CM | POA: Diagnosis not present

## 2016-04-19 DIAGNOSIS — Z9889 Other specified postprocedural states: Secondary | ICD-10-CM

## 2016-04-19 HISTORY — PX: BONE BIOPSY: SHX375

## 2016-04-19 LAB — GLUCOSE, CAPILLARY
Glucose-Capillary: 171 mg/dL — ABNORMAL HIGH (ref 65–99)
Glucose-Capillary: 174 mg/dL — ABNORMAL HIGH (ref 65–99)

## 2016-04-19 SURGERY — BIOPSY, BONE
Anesthesia: Monitor Anesthesia Care | Site: Toe | Laterality: Right

## 2016-04-19 SURGERY — Surgical Case
Anesthesia: *Unknown

## 2016-04-19 MED ORDER — CLINDAMYCIN PHOSPHATE 600 MG/50ML IV SOLN
600.0000 mg | Freq: Once | INTRAVENOUS | Status: AC
  Administered 2016-04-19: 600 mg via INTRAVENOUS
  Filled 2016-04-19: qty 50

## 2016-04-19 MED ORDER — LACTATED RINGERS IV SOLN
INTRAVENOUS | Status: DC | PRN
  Administered 2016-04-19: 11:00:00 via INTRAVENOUS

## 2016-04-19 MED ORDER — FENTANYL CITRATE (PF) 100 MCG/2ML IJ SOLN
INTRAMUSCULAR | Status: AC
  Filled 2016-04-19: qty 2

## 2016-04-19 MED ORDER — BUPIVACAINE HCL (PF) 0.5 % IJ SOLN
INTRAMUSCULAR | Status: DC | PRN
  Administered 2016-04-19: 6 mL

## 2016-04-19 MED ORDER — MIDAZOLAM HCL 2 MG/2ML IJ SOLN
INTRAMUSCULAR | Status: AC
  Filled 2016-04-19: qty 2

## 2016-04-19 MED ORDER — LIDOCAINE HCL (PF) 1 % IJ SOLN
INTRAMUSCULAR | Status: AC
  Filled 2016-04-19: qty 30

## 2016-04-19 MED ORDER — 0.9 % SODIUM CHLORIDE (POUR BTL) OPTIME
TOPICAL | Status: DC | PRN
  Administered 2016-04-19: 1000 mL

## 2016-04-19 MED ORDER — LACTATED RINGERS IV SOLN
INTRAVENOUS | Status: DC
  Administered 2016-04-19: 12:00:00 via INTRAVENOUS

## 2016-04-19 MED ORDER — PROPOFOL 500 MG/50ML IV EMUL
INTRAVENOUS | Status: DC | PRN
  Administered 2016-04-19: 75 ug/kg/min via INTRAVENOUS

## 2016-04-19 MED ORDER — BUPIVACAINE HCL (PF) 0.5 % IJ SOLN
INTRAMUSCULAR | Status: AC
  Filled 2016-04-19: qty 30

## 2016-04-19 MED ORDER — FENTANYL CITRATE (PF) 100 MCG/2ML IJ SOLN
25.0000 ug | INTRAMUSCULAR | Status: DC | PRN
Start: 2016-04-19 — End: 2016-04-19

## 2016-04-19 MED ORDER — PROPOFOL 10 MG/ML IV BOLUS
INTRAVENOUS | Status: AC
  Filled 2016-04-19: qty 40

## 2016-04-19 MED ORDER — MIDAZOLAM HCL 2 MG/2ML IJ SOLN
1.0000 mg | INTRAMUSCULAR | Status: DC | PRN
Start: 2016-04-19 — End: 2016-04-19
  Administered 2016-04-19: 1 mg via INTRAVENOUS
  Administered 2016-04-19: 2 mg via INTRAVENOUS
  Administered 2016-04-19: 1 mg via INTRAVENOUS
  Administered 2016-04-19: 2 mg via INTRAVENOUS
  Filled 2016-04-19: qty 2

## 2016-04-19 MED ORDER — FENTANYL CITRATE (PF) 100 MCG/2ML IJ SOLN
25.0000 ug | INTRAMUSCULAR | Status: AC | PRN
  Administered 2016-04-19 (×2): 25 ug via INTRAVENOUS

## 2016-04-19 MED ORDER — SEVOFLURANE IN SOLN
RESPIRATORY_TRACT | Status: AC
  Filled 2016-04-19: qty 250

## 2016-04-19 SURGICAL SUPPLY — 34 items
BAG HAMPER (MISCELLANEOUS) ×3 IMPLANT
BANDAGE ELASTIC 4 LF NS (GAUZE/BANDAGES/DRESSINGS) ×2 IMPLANT
BANDAGE ELASTIC 4 VELCRO NS (GAUZE/BANDAGES/DRESSINGS) ×3 IMPLANT
BANDAGE ESMARK 4X12 BL STRL LF (DISPOSABLE) ×1 IMPLANT
BLADE 15 SAFETY STRL DISP (BLADE) ×3 IMPLANT
BNDG CMPR 12X4 ELC STRL LF (DISPOSABLE) ×1
BNDG CMPR MED 5X4 ELC HKLP NS (GAUZE/BANDAGES/DRESSINGS) ×1
BNDG ESMARK 4X12 BLUE STRL LF (DISPOSABLE) ×3
BNDG GAUZE ELAST 4 BULKY (GAUZE/BANDAGES/DRESSINGS) ×3 IMPLANT
BOOT STEPPER DURA LG (SOFTGOODS) IMPLANT
BOOT STEPPER DURA MED (SOFTGOODS) IMPLANT
BOOT STEPPER DURA SM (SOFTGOODS) IMPLANT
BOOT STEPPER DURA XLG (SOFTGOODS) IMPLANT
CLOSURE STERI STRIP 1/2 X4 (GAUZE/BANDAGES/DRESSINGS) ×2 IMPLANT
CLOTH BEACON ORANGE TIMEOUT ST (SAFETY) ×3 IMPLANT
COVER LIGHT HANDLE STERIS (MISCELLANEOUS) ×6 IMPLANT
DRSG ADAPTIC 3X8 NADH LF (GAUZE/BANDAGES/DRESSINGS) ×3 IMPLANT
GAUZE SPONGE 4X4 12PLY STRL (GAUZE/BANDAGES/DRESSINGS) ×2 IMPLANT
GLOVE BIO SURGEON STRL SZ7.5 (GLOVE) ×3 IMPLANT
GLOVE BIOGEL PI IND STRL 7.0 (GLOVE) ×2 IMPLANT
GLOVE BIOGEL PI INDICATOR 7.0 (GLOVE) ×4
GOWN STRL REUS W/TWL LRG LVL3 (GOWN DISPOSABLE) ×9 IMPLANT
KIT ROOM TURNOVER APOR (KITS) ×3 IMPLANT
MANIFOLD NEPTUNE II (INSTRUMENTS) ×3 IMPLANT
NS IRRIG 1000ML POUR BTL (IV SOLUTION) ×3 IMPLANT
PACK BASIC LIMB (CUSTOM PROCEDURE TRAY) ×3 IMPLANT
PAD ARMBOARD 7.5X6 YLW CONV (MISCELLANEOUS) ×3 IMPLANT
SET BASIN LINEN APH (SET/KITS/TRAYS/PACK) ×3 IMPLANT
SPONGE LAP 18X18 X RAY DECT (DISPOSABLE) ×3 IMPLANT
SUT ETHIBOND 3 0 (SUTURE) ×1 IMPLANT
SUT PROLENE 4 0 PS 2 18 (SUTURE) ×2 IMPLANT
SUT VIC AB 4-0 PS2 27 (SUTURE) ×1 IMPLANT
SYR CONTROL 10ML LL (SYRINGE) ×5 IMPLANT
TOWEL OR 17X26 4PK STRL BLUE (TOWEL DISPOSABLE) ×3 IMPLANT

## 2016-04-19 NOTE — Anesthesia Preprocedure Evaluation (Signed)
Anesthesia Evaluation  Patient identified by MRN, date of birth, ID band Patient awake    Reviewed: Allergy & Precautions, NPO status , Patient's Chart, lab work & pertinent test results  Airway Mallampati: II  TM Distance: >3 FB Neck ROM: Full    Dental  (+) Teeth Intact   Pulmonary asthma , sleep apnea , COPD, former smoker,    breath sounds clear to auscultation       Cardiovascular hypertension, Pt. on medications  Rhythm:Regular Rate:Normal     Neuro/Psych PSYCHIATRIC DISORDERS Depression    GI/Hepatic GERD  ,  Endo/Other  diabetes, Type 2, Insulin DependentMorbid obesity  Renal/GU      Musculoskeletal  (+) Arthritis , Rheumatoid disorders,    Abdominal (+) + obese,   Peds  Hematology   Anesthesia Other Findings   Reproductive/Obstetrics                             Anesthesia Physical Anesthesia Plan  ASA: III  Anesthesia Plan: MAC   Post-op Pain Management:    Induction: Intravenous  Airway Management Planned: Simple Face Mask  Additional Equipment:   Intra-op Plan:   Post-operative Plan:   Informed Consent: I have reviewed the patients History and Physical, chart, labs and discussed the procedure including the risks, benefits and alternatives for the proposed anesthesia with the patient or authorized representative who has indicated his/her understanding and acceptance.     Plan Discussed with:   Anesthesia Plan Comments:         Anesthesia Quick Evaluation

## 2016-04-19 NOTE — Transfer of Care (Signed)
Immediate Anesthesia Transfer of Care Note  Patient: [name]  Procedure(s) Performed: Procedure(s): BONE BIOPSY RIGHT GREAT TOE (Right)  Patient Location: PACU  Anesthesia Type:MAC  Level of Consciousness: awake, alert , oriented and patient cooperative  Airway & Oxygen Therapy: Patient Spontanous Breathing and Patient connected to nasal cannula oxygen  Post-op Assessment: Report given to RN and Post -op Vital signs reviewed and stable  Post vital signs: Reviewed and stable  Last Vitals:  Vitals:   04/19/16 1155 04/19/16 1200  BP: (!) 107/56 (!) 93/47  Resp: (!) 24 (!) 24  Temp:      Last Pain:  Vitals:   04/19/16 1200  TempSrc:   PainSc: 1       Patients Stated Pain Goal: 6 (32/99/24 2683)  Complications: No apparent anesthesia complications

## 2016-04-19 NOTE — Anesthesia Postprocedure Evaluation (Signed)
Anesthesia Post Note  Patient: AVILYN VIRTUE  Procedure(s) Performed: Procedure(s) (LRB): BONE BIOPSY RIGHT GREAT TOE (Right)  Patient location during evaluation: PACU Anesthesia Type: MAC Level of consciousness: awake and alert and oriented Pain management: pain level controlled Vital Signs Assessment: post-procedure vital signs reviewed and stable Respiratory status: spontaneous breathing Cardiovascular status: stable Postop Assessment: no signs of nausea or vomiting Anesthetic complications: no     Last Vitals:  Vitals:   04/19/16 1155 04/19/16 1200  BP: (!) 107/56 (!) 93/47  Resp: (!) 24 (!) 24  Temp:      Last Pain:  Vitals:   04/19/16 1200  TempSrc:   PainSc: 1                  Nazaiah Navarrete A

## 2016-04-19 NOTE — Op Note (Signed)
OPERATIVE NOTE  DATE OF PROCEDURE 04/19/2016  SURGEON Marcheta Grammes, DPM  ASSISTANT SURGEON None  OR STAFF Circulator: Venda Rodes, RN Scrub Person: Lucie Leather, CST Circulator Assistant: Towanda Malkin, RN   PREOPERATIVE DIAGNOSIS 1.  Osteomyelitis, right great toe 2.  Chronic wound, right great toe   3.  Diabetes mellitus with peripheral neuropathy 4.  Rheumatoid arthritis  POSTOPERATIVE DIAGNOSIS Same  PROCEDURE 1.  Bone biopsy of distal phalanx, right great toe 2.  Bone biopsy of proximal phalanx, right great toe  ANESTHESIA Monitor Anesthesia Care   HEMOSTASIS None  ESTIMATED BLOOD LOSS Minimal (<5 cc)  MATERIALS USED None  INJECTABLES 0.5% Marcaine plain  PATHOLOGY Bone biopsy of distal phalanx, right great toe for pathology Bone biopsy of proximal phalanx, right great toe for pathology Bone biopsy of distal phalanx, right great toe for culture Bone biopsy of proximal phalanx, right great toe for culture  COMPLICATIONS None  INDICATIONS:  Patient has a chronic wound of the right great toe with MRI findings suggestive of osteomyelitis / septic interphalangeal joint of the right hallux.  She is being followed at the [facility] in [place].  Bone biopsy was recommended.  DESCRIPTION OF THE PROCEDURE:  The patient was brought to the operating room and placed on the operative table in the supine position. Following sedation, the surgical site was anesthetized with 0.5% Marcaine plain.  The foot was then prepped, scrubbed, and draped in the usual sterile technique.    Attention was directed to the dorsal medial aspect of the right great toe overlying the distal phalanx.  A #15 blade was used to perform a stab incision.  Dissection was continued deep down to the level of the distal phalanx.  A bone biopsy needle was used to obtain a bone specimen from the distal phalanx.  The specimen was submitted to pathology for evaluation.  A bone biopsy  needle was used to obtain a second bone specimen from the distal phalanx.  The specimen was submitted to microbioloby for culture.  Attention was directed to the dorsal medial aspect of the right great toe overlying the proximal phalanx.  A #15 blade was used to perform a stab incision.  Dissection was continued deep down to the level of the proximal phalanx.  A bone biopsy needle was used to obtain a bone specimen from the proximal phalanx.  The specimen was submitted to pathology for evaluation.  A bone biopsy needle was used to obtain a second bone specimen from the proximal phalanx.  The specimen was submitted to microbioloby for culture.  Incisions were closed with 4-0 Prolene.  A sterile dressing was applied to the right great toe.   The patient tolerated the procedure well.  The patient was then transferred to PACU with vital signs stable and vascular status intact to all toes of the operative foot.

## 2016-04-19 NOTE — Brief Op Note (Signed)
BRIEF OPERATIVE NOTE  DATE OF PROCEDURE 04/19/2016  SURGEON Marcheta Grammes, DPM  ASSISTANT SURGEON None  OR STAFF Circulator: Venda Rodes, RN Scrub Person: Lucie Leather, CST Circulator Assistant: Towanda Malkin, RN   PREOPERATIVE DIAGNOSIS 1.  Osteomyelitis, right great toe 2.  Diabetes mellitus with peripheral neuropathy 3.  Rheumatoid arthritis  POSTOPERATIVE DIAGNOSIS Same  PROCEDURE 1.  Bone biopsy of distal phalanx, right great toe 2.  Bone biopsy of proximal phalanx, right great toe  ANESTHESIA Monitor Anesthesia Care   HEMOSTASIS None  ESTIMATED BLOOD LOSS Minimal (<5 cc)  MATERIALS USED None  INJECTABLES 0.5% Marcaine plain  PATHOLOGY Bone biopsy of distal phalanx, right great toe for pathology Bone biopsy of proximal phalanx, right great toe for pathology Bone biopsy of distal phalanx, right great toe for culture Bone biopsy of proximal phalanx, right great toe for culture  COMPLICATIONS None

## 2016-04-19 NOTE — Discharge Instructions (Signed)
These instructions will give you an idea of what to expect after surgery and how to manage issues that may arise before your first post op office visit. ° °Pain Management °Pain is best managed by “staying ahead” of it. If pain gets out of control, it is difficult to get it back under control. Local anesthesia that lasts 6-8 hours is used to numb the foot and decrease pain.  For the best pain control, take the pain medication every 4 hours for the first 2 days post op. On the third day pain medication can be taken as needed.  ° °Post Op Nausea °Nausea is common after surgery, so it is managed proactively.  °If prescribed, use the prescribed nausea medication regularly for the first 2 days post op. ° °Bandages °Do not worry if there is blood on the bandage. What looks like a lot of blood on the bandage is actually a small amount. Blood on the dressing spreads out as it is absorbed by the gauze, the same way a drop of water spreads out on a paper towel.  °If the bandages feel wet or dry, stiff and uncomfortable, call the office during office hours and we will schedule a time for you to have the bandage changed.  °Unless you are specifically told otherwise, we will do the first bandage change in the office.  °Keep your bandage dry. If the bandage becomes wet or soiled, notify the office and we will schedule a time to change the bandage. ° °Activity °It is best to spend most of the first 2 days after surgery lying down with the foot elevated above the level of your heart. °You may put weight on your heel while wearing the surgical shoe.   °You may only get up to go to the restroom. ° °Driving °Do not drive until you are able to respond in an emergency (i.e. slam on the brakes). This usually occurs after the bone has healed - 6 to 8 weeks. ° °Call the Office °If you have a fever over 101°F.  °If you have increasing pain after the initial post op pain has settled down.  °If you have increasing redness, swelling, or  drainage.  °If you have any questions or concerns.  ° ° °PATIENT INSTRUCTIONS °POST-ANESTHESIA ° °IMMEDIATELY FOLLOWING SURGERY:  Do not drive or operate machinery for the first twenty four hours after surgery.  Do not make any important decisions for twenty four hours after surgery or while taking narcotic pain medications or sedatives.  If you develop intractable nausea and vomiting or a severe headache please notify your doctor immediately. ° °FOLLOW-UP:  Please make an appointment with your surgeon as instructed. You do not need to follow up with anesthesia unless specifically instructed to do so. ° °WOUND CARE INSTRUCTIONS (if applicable):  Keep a dry clean dressing on the anesthesia/puncture wound site if there is drainage.  Once the wound has quit draining you may leave it open to air.  Generally you should leave the bandage intact for twenty four hours unless there is drainage.  If the epidural site drains for more than 36-48 hours please call the anesthesia department. ° °QUESTIONS?:  Please feel free to call your physician or the hospital operator if you have any questions, and they will be happy to assist you.    ° ° ° °

## 2016-04-19 NOTE — Anesthesia Procedure Notes (Signed)
Procedure Name: MAC Date/Time: 04/19/2016 12:05 PM Performed by: Andree Elk, Luisfelipe Engelstad A Pre-anesthesia Checklist: Patient identified, Timeout performed, Emergency Drugs available, Suction available and Patient being monitored Oxygen Delivery Method: Simple face mask

## 2016-04-19 NOTE — H&P (Signed)
HISTORY AND PHYSICAL INTERVAL NOTE:  04/19/2016  12:00 PM  [name]  has presented today for surgery, with suspected diagnosis of osteomyelitis right great toe.  The various methods of treatment have been discussed with the patient.  No guarantees were given.  After consideration of risks, benefits and other options for treatment, the patient has consented to surgery.  I have reviewed the patients' chart and labs.    Patient Vitals for the past 24 hrs:  BP Temp Temp src Resp SpO2 Height Weight  04/19/16 1155 (!) 107/56 - - (!) 24 96 % - -  04/19/16 1150 (!) 101/50 - - 14 96 % - -  04/19/16 1145 (!) 100/55 - - 17 95 % - -  04/19/16 1140 (!) 106/47 - - (!) 35 97 % - -  04/19/16 1135 (!) 95/45 - - (!) 21 96 % - -  04/19/16 1130 (!) 93/46 - - 17 94 % - -  04/19/16 1125 (!) 103/52 - - 19 97 % - -  04/19/16 1120 (!) 110/53 - - (!) 21 97 % - -  04/19/16 1115 (!) 103/54 - - 17 96 % - -  04/19/16 1110 (!) 103/55 - - 20 96 % - -  04/19/16 1105 (!) 99/51 - - 17 - - -  04/19/16 1100 (!) 117/53 - - (!) 26 97 % - -  04/19/16 1055 (!) 104/54 - - 18 97 % - -  04/19/16 1050 (!) 110/49 - - 12 96 % - -  04/19/16 1045 (!) 108/49 - - 20 96 % - -  04/19/16 1043 - - - - - 5\' 2"  (1.575 m) 278 lb (126.1 kg)  04/19/16 1040 - - - (!) 22 97 % - -  04/19/16 1036 - 98.3 F (36.8 C) Oral - - - -    A history and physical examination was performed in my office.  The patient was reexamined.  There have been no changes to this history and physical examination.  Marcheta Grammes, DPM

## 2016-04-20 ENCOUNTER — Encounter (HOSPITAL_COMMUNITY): Payer: Self-pay | Admitting: Podiatry

## 2016-04-20 DIAGNOSIS — L97511 Non-pressure chronic ulcer of other part of right foot limited to breakdown of skin: Secondary | ICD-10-CM | POA: Diagnosis not present

## 2016-04-20 DIAGNOSIS — L84 Corns and callosities: Secondary | ICD-10-CM | POA: Diagnosis not present

## 2016-04-20 DIAGNOSIS — J449 Chronic obstructive pulmonary disease, unspecified: Secondary | ICD-10-CM | POA: Diagnosis not present

## 2016-04-20 DIAGNOSIS — G4730 Sleep apnea, unspecified: Secondary | ICD-10-CM | POA: Diagnosis not present

## 2016-04-20 DIAGNOSIS — E11621 Type 2 diabetes mellitus with foot ulcer: Secondary | ICD-10-CM | POA: Diagnosis not present

## 2016-04-20 DIAGNOSIS — I10 Essential (primary) hypertension: Secondary | ICD-10-CM | POA: Diagnosis not present

## 2016-04-24 LAB — AEROBIC/ANAEROBIC CULTURE W GRAM STAIN (SURGICAL/DEEP WOUND): Culture: NO GROWTH

## 2016-04-24 LAB — AEROBIC/ANAEROBIC CULTURE (SURGICAL/DEEP WOUND): CULTURE: NO GROWTH

## 2016-04-25 DIAGNOSIS — I10 Essential (primary) hypertension: Secondary | ICD-10-CM | POA: Diagnosis not present

## 2016-04-25 DIAGNOSIS — Z6841 Body Mass Index (BMI) 40.0 and over, adult: Secondary | ICD-10-CM | POA: Diagnosis not present

## 2016-04-25 DIAGNOSIS — E1149 Type 2 diabetes mellitus with other diabetic neurological complication: Secondary | ICD-10-CM | POA: Diagnosis not present

## 2016-04-26 DIAGNOSIS — J449 Chronic obstructive pulmonary disease, unspecified: Secondary | ICD-10-CM | POA: Diagnosis not present

## 2016-04-26 DIAGNOSIS — G4730 Sleep apnea, unspecified: Secondary | ICD-10-CM | POA: Diagnosis not present

## 2016-04-26 DIAGNOSIS — M86371 Chronic multifocal osteomyelitis, right ankle and foot: Secondary | ICD-10-CM | POA: Diagnosis not present

## 2016-04-26 DIAGNOSIS — L97512 Non-pressure chronic ulcer of other part of right foot with fat layer exposed: Secondary | ICD-10-CM | POA: Diagnosis not present

## 2016-04-26 DIAGNOSIS — I10 Essential (primary) hypertension: Secondary | ICD-10-CM | POA: Diagnosis not present

## 2016-04-26 DIAGNOSIS — L84 Corns and callosities: Secondary | ICD-10-CM | POA: Diagnosis not present

## 2016-04-26 DIAGNOSIS — E11621 Type 2 diabetes mellitus with foot ulcer: Secondary | ICD-10-CM | POA: Diagnosis not present

## 2016-04-26 DIAGNOSIS — L97511 Non-pressure chronic ulcer of other part of right foot limited to breakdown of skin: Secondary | ICD-10-CM | POA: Diagnosis not present

## 2016-05-03 ENCOUNTER — Ambulatory Visit: Payer: Medicare Other | Admitting: Internal Medicine

## 2016-05-03 DIAGNOSIS — I10 Essential (primary) hypertension: Secondary | ICD-10-CM | POA: Diagnosis not present

## 2016-05-03 DIAGNOSIS — L84 Corns and callosities: Secondary | ICD-10-CM | POA: Diagnosis not present

## 2016-05-03 DIAGNOSIS — E11621 Type 2 diabetes mellitus with foot ulcer: Secondary | ICD-10-CM | POA: Diagnosis not present

## 2016-05-03 DIAGNOSIS — L97512 Non-pressure chronic ulcer of other part of right foot with fat layer exposed: Secondary | ICD-10-CM | POA: Diagnosis not present

## 2016-05-03 DIAGNOSIS — M86371 Chronic multifocal osteomyelitis, right ankle and foot: Secondary | ICD-10-CM | POA: Diagnosis not present

## 2016-05-03 DIAGNOSIS — L97511 Non-pressure chronic ulcer of other part of right foot limited to breakdown of skin: Secondary | ICD-10-CM | POA: Diagnosis not present

## 2016-05-03 DIAGNOSIS — J449 Chronic obstructive pulmonary disease, unspecified: Secondary | ICD-10-CM | POA: Diagnosis not present

## 2016-05-03 DIAGNOSIS — G4730 Sleep apnea, unspecified: Secondary | ICD-10-CM | POA: Diagnosis not present

## 2016-05-06 DIAGNOSIS — B351 Tinea unguium: Secondary | ICD-10-CM | POA: Diagnosis not present

## 2016-05-06 DIAGNOSIS — E1342 Other specified diabetes mellitus with diabetic polyneuropathy: Secondary | ICD-10-CM | POA: Diagnosis not present

## 2016-05-10 ENCOUNTER — Encounter: Payer: Self-pay | Admitting: Internal Medicine

## 2016-05-10 ENCOUNTER — Ambulatory Visit (INDEPENDENT_AMBULATORY_CARE_PROVIDER_SITE_OTHER): Payer: Medicare Other | Admitting: Internal Medicine

## 2016-05-10 DIAGNOSIS — L97512 Non-pressure chronic ulcer of other part of right foot with fat layer exposed: Secondary | ICD-10-CM | POA: Diagnosis not present

## 2016-05-10 DIAGNOSIS — E11621 Type 2 diabetes mellitus with foot ulcer: Secondary | ICD-10-CM | POA: Diagnosis not present

## 2016-05-10 DIAGNOSIS — L97509 Non-pressure chronic ulcer of other part of unspecified foot with unspecified severity: Secondary | ICD-10-CM

## 2016-05-10 LAB — CBC
HEMATOCRIT: 47 % — AB (ref 35.0–45.0)
HEMOGLOBIN: 14.9 g/dL (ref 11.7–15.5)
MCH: 25.9 pg — AB (ref 27.0–33.0)
MCHC: 31.7 g/dL — AB (ref 32.0–36.0)
MCV: 81.6 fL (ref 80.0–100.0)
MPV: 9.2 fL (ref 7.5–12.5)
Platelets: 222 10*3/uL (ref 140–400)
RBC: 5.76 MIL/uL — ABNORMAL HIGH (ref 3.80–5.10)
RDW: 17.1 % — AB (ref 11.0–15.0)
WBC: 9.3 10*3/uL (ref 3.8–10.8)

## 2016-05-10 LAB — BASIC METABOLIC PANEL
BUN: 27 mg/dL — AB (ref 7–25)
CALCIUM: 9.7 mg/dL (ref 8.6–10.4)
CO2: 28 mmol/L (ref 20–31)
Chloride: 100 mmol/L (ref 98–110)
Creat: 0.84 mg/dL (ref 0.50–0.99)
GLUCOSE: 133 mg/dL — AB (ref 65–99)
Potassium: 4.1 mmol/L (ref 3.5–5.3)
SODIUM: 137 mmol/L (ref 135–146)

## 2016-05-10 MED ORDER — LEVOFLOXACIN 500 MG PO TABS: 500.0000 mg | ORAL_TABLET | Freq: Every day | ORAL | 1 refills | Status: DC

## 2016-05-10 MED ORDER — METRONIDAZOLE 500 MG PO TABS: 500.0000 mg | ORAL_TABLET | Freq: Three times a day (TID) | ORAL | 1 refills | Status: DC

## 2016-05-10 NOTE — Progress Notes (Signed)
[name]  Reason for Consult: Diabetic foot ulcer with possible chronic osteomyelitis Referring Physician: Dr. Christin Fudge  Patient Active Problem List   Diagnosis Date Noted  . Diabetic foot ulcer ([place]) 05/10/2016    Priority: High  . 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 Recurrent ventral incisional hernia 11/01/2010  . 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 OTH COMPLICATIONS DUE INTERNAL JOINT PROSTHESIS 06/09/2010  . CARDIAC MURMUR 05/29/2009  . Obstructive sleep apnea 08/21/2007  . COPD (chronic obstructive pulmonary disease) with emphysema ([place]) 08/21/2007  . DM 08/20/2007  . HYPERLIPIDEMIA 08/20/2007  . MORBID OBESITY 08/20/2007    Patient's Medications  New Prescriptions   LEVOFLOXACIN (LEVAQUIN) 500 MG TABLET    Take 1 tablet (500 mg total) by mouth daily.   METRONIDAZOLE (FLAGYL) 500 MG TABLET    Take 1 tablet (500 mg total) by mouth 3 (three) times daily.  Previous Medications   ADVAIR HFA 115-21 MCG/ACT INHALER    USE 2 INHALATIONS TWICE A DAY   ALBIGLUTIDE (TANZEUM) 30 MG PEN    Inject 30 mg into the skin once a week.   ALPRAZOLAM (XANAX) 0.5 MG TABLET    Take 0.25-0.5 mg by mouth 3 (three) times daily as needed for sleep or anxiety.   ASPIRIN 81 MG TABLET    Take 81 mg by mouth daily.   CALCIUM CARBONATE-VITAMIN D (CALCIUM 600+D) 600-400 MG-UNIT PER TABLET    Take 1 tablet by mouth 2 (two) times daily.     EMPAGLIFLOZIN (JARDIANCE) 25 MG TABS TABLET    Take 25 mg by mouth daily.   FEXOFENADINE (ALLEGRA) 180 MG TABLET    Take 180 mg by mouth daily.     FISH OIL-OMEGA-3 FATTY ACIDS 1000 MG CAPSULE    Take 2 g by mouth every evening.    FOLIC ACID (FOLVITE) 1 MG TABLET    Take 1 mg by mouth daily.   FUROSEMIDE (LASIX) 40 MG TABLET    Take 40 mg by mouth daily as needed for fluid. Fluid   GABAPENTIN (NEURONTIN) 600 MG TABLET    Take 600 mg by mouth at bedtime.   IBUPROFEN  (ADVIL,MOTRIN) 200 MG TABLET    Take 400-600 mg by mouth every 8 (eight) hours as needed. Pain   INFLIXIMAB (REMICADE IV)    Inject into the vein. Every 8 weeks   INSULIN ASPART (NOVOLOG) 100 UNIT/ML INJECTION    Inject 10-15 Units into the skin 3 (three) times daily before meals. Inject 10Units subcutaneously every morning, 15 Units subcutaneously at lunchtime and 10 Units subcutaneously at bedtime as directed   INSULIN GLARGINE (LANTUS) 100 UNIT/ML INJECTION    Inject 80 Units into the skin 2 (two) times daily. Per patient 80 in am and 80 in pm.   LOSARTAN (COZAAR) 100 MG TABLET    Take 100 mg by mouth daily.   METFORMIN (GLUCOPHAGE-XR) 500 MG 24 HR TABLET    Take 2,000 mg by mouth daily.     METHOCARBAMOL (ROBAXIN) 750 MG TABLET    Take 750 mg by mouth every 8 (eight) hours as needed. Muscle Spasms   METHOTREXATE (RHEUMATREX) 2.5 MG TABLET    Take 20 mg by mouth once a week. Caution:Chemotherapy. Protect from light.   MULTIPLE VITAMINS-MINERALS (ICAPS MV PO)    Take 1 tablet by mouth 2 (two) times daily.   OMEPRAZOLE (PRILOSEC) 40 MG CAPSULE  TAKE 1 CAPSULE EVERY MORNING   PROAIR HFA 108 (90 BASE) MCG/ACT INHALER    INHALE 2 PUFFS INTO THE LUNGS EVERY 6 HOURS AS NEEDED FOR WHEEZING OR SHORTNESS OF BREATH   SIMVASTATIN (ZOCOR) 40 MG TABLET    Take 40 mg by mouth daily with supper.    TIOTROPIUM (SPIRIVA) 18 MCG INHALATION CAPSULE    Place 18 mcg into inhaler and inhale daily.    Modified Medications   No medications on file  Discontinued Medications   No medications on file    Recommendations: 1. CBC, basic metabolic panel, sedimentation rate and C-reactive protein 2. Start levofloxacin and metronidazole 3. Follow-up in one month   Assessment: There is really no simple an accurate way to know if she has chronic osteomyelitis contributing to her poor wound healing. I reviewed management options with her including no antibiotic therapy and continued wound care, empiric oral antibiotics, or  empiric IV antibiotics. After discussion of the pros and cons of each option we elected to try a course of oral levofloxacin and metronidazole. I did warn her about potential side effects of the medication.   HPI: [name] is a 66 y.o. female with diabetes and rheumatoid arthritis. She's had problems with dry skin and a callus on her right great toe for many years. Last June she developed an ulcer over the tip of that toe. She does not recall any injury. She has been going to the wound center and has tried multiple different treatments without much improvement. She underwent an MRI in December which suggested chronic septic arthritis and osteomyelitis. She underwent bone biopsies of the proximal and distal phalanx on 04/05/2016. Pathology review showed only benign bone fragments. No organisms were seen on Gram stain or culture from either specimen. She had not been on any antibiotics prior to her surgery. I saw her previously in 2011 when she developed MSSA infection of her right prosthetic knee. That infection was cured and she's had no problems since that time.  Review of Systems: Review of Systems  Constitutional: Negative for chills, diaphoresis, fever, malaise/fatigue and weight loss.  HENT: Negative for sore throat.   Cardiovascular: Negative for chest pain.  Gastrointestinal: Negative for abdominal pain, diarrhea, nausea and vomiting.  Musculoskeletal: Negative for joint pain and myalgias.  Skin: Negative for rash.      Past Medical History:  Diagnosis Date  . Asthma   . Depression   . Diabetes mellitus   . GERD (gastroesophageal reflux disease)   . Hyperlipidemia   . Hypertension   . Morbid obesity ([place])   . Obstructive sleep apnea syndrome   . Osteoarthritis of right knee 04/2010   end-stage  . Rheumatoid arthritis(714.0)    on methotrexate therapy  . Septic arthritis of knee, right ([place]) 04/2010    Social History  Substance Use Topics  . Smoking status: Former Smoker      Packs/day: 2.00    Years: 25.00    Types: Cigarettes    Quit date: 04/11/1992  . Smokeless tobacco: Never Used  . Alcohol use No    Family History  Problem Relation Age of Onset  . Cancer Mother   . Hyperlipidemia Mother   . Diabetes Mother   . Diabetes Father   . COPD Father   . Hyperlipidemia Brother   . Hypertension Brother    Allergies  Allergen Reactions  . Demerol Nausea Only    Severe  . Hydromorphone Hcl Itching    All over  the body.  . Tetracycline Hives    OBJECTIVE: Vitals:   05/10/16 1020  BP: (!) 144/76  Pulse: 88  Temp: 97.8 F (36.6 C)  TempSrc: Oral  Weight: 273 lb (123.8 kg)  Height: 5\' 2"  (1.575 m)   Body mass index is 49.93 kg/m.   Physical Exam  Constitutional: She is oriented to person, place, and time.  She is in good spirits.  Musculoskeletal:  Her right great toenail is missing. She has a chronic callus over the tip of her right great toe extending medially. There is a 10-12 mm central ulceration. There is no exposed bone. There is no acute redness, pain, warmth or swelling. There is no drainage.  Neurological: She is alert and oriented to person, place, and time.  Skin: No rash noted.  Psychiatric: Mood and affect normal.    Microbiology: No results found for this or any previous visit (from the past 240 hour(s)).  Michel Bickers, MD [facility] [place] [facility] [phone] pager   [phone] cell 05/10/2016, 10:47 AM

## 2016-05-11 DIAGNOSIS — E11621 Type 2 diabetes mellitus with foot ulcer: Secondary | ICD-10-CM | POA: Diagnosis not present

## 2016-05-11 DIAGNOSIS — L97512 Non-pressure chronic ulcer of other part of right foot with fat layer exposed: Secondary | ICD-10-CM | POA: Diagnosis not present

## 2016-05-11 DIAGNOSIS — L84 Corns and callosities: Secondary | ICD-10-CM | POA: Diagnosis not present

## 2016-05-11 DIAGNOSIS — I10 Essential (primary) hypertension: Secondary | ICD-10-CM | POA: Diagnosis not present

## 2016-05-11 DIAGNOSIS — J449 Chronic obstructive pulmonary disease, unspecified: Secondary | ICD-10-CM | POA: Diagnosis not present

## 2016-05-11 DIAGNOSIS — L97511 Non-pressure chronic ulcer of other part of right foot limited to breakdown of skin: Secondary | ICD-10-CM | POA: Diagnosis not present

## 2016-05-11 DIAGNOSIS — G4730 Sleep apnea, unspecified: Secondary | ICD-10-CM | POA: Diagnosis not present

## 2016-05-11 LAB — SEDIMENTATION RATE: SED RATE: 1 mm/h (ref 0–30)

## 2016-05-11 LAB — C-REACTIVE PROTEIN: CRP: 2.6 mg/L (ref <8.0)

## 2016-05-17 ENCOUNTER — Encounter (HOSPITAL_BASED_OUTPATIENT_CLINIC_OR_DEPARTMENT_OTHER): Payer: Medicare Other | Attending: Surgery

## 2016-05-17 DIAGNOSIS — E11621 Type 2 diabetes mellitus with foot ulcer: Secondary | ICD-10-CM | POA: Insufficient documentation

## 2016-05-17 DIAGNOSIS — L97512 Non-pressure chronic ulcer of other part of right foot with fat layer exposed: Secondary | ICD-10-CM | POA: Diagnosis not present

## 2016-05-17 DIAGNOSIS — L97511 Non-pressure chronic ulcer of other part of right foot limited to breakdown of skin: Secondary | ICD-10-CM | POA: Insufficient documentation

## 2016-05-17 DIAGNOSIS — I10 Essential (primary) hypertension: Secondary | ICD-10-CM | POA: Insufficient documentation

## 2016-05-17 DIAGNOSIS — G4730 Sleep apnea, unspecified: Secondary | ICD-10-CM | POA: Insufficient documentation

## 2016-05-17 DIAGNOSIS — J449 Chronic obstructive pulmonary disease, unspecified: Secondary | ICD-10-CM | POA: Insufficient documentation

## 2016-05-17 DIAGNOSIS — M86371 Chronic multifocal osteomyelitis, right ankle and foot: Secondary | ICD-10-CM | POA: Insufficient documentation

## 2016-05-17 DIAGNOSIS — Z6841 Body Mass Index (BMI) 40.0 and over, adult: Secondary | ICD-10-CM | POA: Diagnosis not present

## 2016-05-17 DIAGNOSIS — E1140 Type 2 diabetes mellitus with diabetic neuropathy, unspecified: Secondary | ICD-10-CM | POA: Insufficient documentation

## 2016-05-17 DIAGNOSIS — L84 Corns and callosities: Secondary | ICD-10-CM | POA: Insufficient documentation

## 2016-05-17 DIAGNOSIS — M069 Rheumatoid arthritis, unspecified: Secondary | ICD-10-CM | POA: Diagnosis not present

## 2016-05-19 ENCOUNTER — Ambulatory Visit: Payer: Medicare Other | Admitting: Internal Medicine

## 2016-05-30 DIAGNOSIS — M0589 Other rheumatoid arthritis with rheumatoid factor of multiple sites: Secondary | ICD-10-CM | POA: Diagnosis not present

## 2016-05-30 DIAGNOSIS — Z6841 Body Mass Index (BMI) 40.0 and over, adult: Secondary | ICD-10-CM | POA: Diagnosis not present

## 2016-05-30 DIAGNOSIS — Z79899 Other long term (current) drug therapy: Secondary | ICD-10-CM | POA: Diagnosis not present

## 2016-05-30 DIAGNOSIS — M150 Primary generalized (osteo)arthritis: Secondary | ICD-10-CM | POA: Diagnosis not present

## 2016-05-31 DIAGNOSIS — L84 Corns and callosities: Secondary | ICD-10-CM | POA: Diagnosis not present

## 2016-05-31 DIAGNOSIS — L97511 Non-pressure chronic ulcer of other part of right foot limited to breakdown of skin: Secondary | ICD-10-CM | POA: Diagnosis not present

## 2016-05-31 DIAGNOSIS — E11621 Type 2 diabetes mellitus with foot ulcer: Secondary | ICD-10-CM | POA: Diagnosis not present

## 2016-05-31 DIAGNOSIS — L97512 Non-pressure chronic ulcer of other part of right foot with fat layer exposed: Secondary | ICD-10-CM | POA: Diagnosis not present

## 2016-05-31 DIAGNOSIS — J449 Chronic obstructive pulmonary disease, unspecified: Secondary | ICD-10-CM | POA: Diagnosis not present

## 2016-05-31 DIAGNOSIS — M86371 Chronic multifocal osteomyelitis, right ankle and foot: Secondary | ICD-10-CM | POA: Diagnosis not present

## 2016-06-07 ENCOUNTER — Encounter: Payer: Self-pay | Admitting: Internal Medicine

## 2016-06-07 ENCOUNTER — Ambulatory Visit (INDEPENDENT_AMBULATORY_CARE_PROVIDER_SITE_OTHER): Payer: Medicare Other | Admitting: Internal Medicine

## 2016-06-07 DIAGNOSIS — L97519 Non-pressure chronic ulcer of other part of right foot with unspecified severity: Secondary | ICD-10-CM | POA: Diagnosis not present

## 2016-06-07 DIAGNOSIS — L97511 Non-pressure chronic ulcer of other part of right foot limited to breakdown of skin: Secondary | ICD-10-CM | POA: Diagnosis not present

## 2016-06-07 DIAGNOSIS — L84 Corns and callosities: Secondary | ICD-10-CM | POA: Diagnosis not present

## 2016-06-07 DIAGNOSIS — E11621 Type 2 diabetes mellitus with foot ulcer: Secondary | ICD-10-CM

## 2016-06-07 DIAGNOSIS — M86371 Chronic multifocal osteomyelitis, right ankle and foot: Secondary | ICD-10-CM | POA: Diagnosis not present

## 2016-06-07 DIAGNOSIS — J449 Chronic obstructive pulmonary disease, unspecified: Secondary | ICD-10-CM | POA: Diagnosis not present

## 2016-06-07 NOTE — Assessment & Plan Note (Signed)
She does not have any evidence of active infection at this time. Her recent MRI suggested osteomyelitis of the distal toe but biopsies were benign and cultures were negative. She is not tolerating empiric antibiotics well. I think it is best to stop her antibiotics now and see how she does. She can follow-up here as needed.

## 2016-06-07 NOTE — Progress Notes (Signed)
[facility] for [facility]ious Disease  Patient Active Problem List   Diagnosis Date Noted  . Diabetic foot ulcer ([place]) 05/10/2016    Priority: High  . 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 Recurrent ventral incisional hernia 11/01/2010  . 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 OTH COMPLICATIONS DUE INTERNAL JOINT PROSTHESIS 06/09/2010  . CARDIAC MURMUR 05/29/2009  . Obstructive sleep apnea 08/21/2007  . COPD (chronic obstructive pulmonary disease) with emphysema ([place]) 08/21/2007  . DM 08/20/2007  . HYPERLIPIDEMIA 08/20/2007  . MORBID OBESITY 08/20/2007    Patient's Medications  New Prescriptions   No medications on file  Previous Medications   ADVAIR HFA 115-21 MCG/ACT INHALER    USE 2 INHALATIONS TWICE A DAY   ALBIGLUTIDE (TANZEUM) 30 MG PEN    Inject 30 mg into the skin once a week.   ALPRAZOLAM (XANAX) 0.5 MG TABLET    Take 0.25-0.5 mg by mouth 3 (three) times daily as needed for sleep or anxiety.   ASPIRIN 81 MG TABLET    Take 81 mg by mouth daily.   CALCIUM CARBONATE-VITAMIN D (CALCIUM 600+D) 600-400 MG-UNIT PER TABLET    Take 1 tablet by mouth 2 (two) times daily.     EMPAGLIFLOZIN (JARDIANCE) 25 MG TABS TABLET    Take 25 mg by mouth daily.   FEXOFENADINE (ALLEGRA) 180 MG TABLET    Take 180 mg by mouth daily.     FISH OIL-OMEGA-3 FATTY ACIDS 1000 MG CAPSULE    Take 2 g by mouth every evening.    FOLIC ACID (FOLVITE) 1 MG TABLET    Take 1 mg by mouth daily.   FUROSEMIDE (LASIX) 40 MG TABLET    Take 40 mg by mouth daily as needed for fluid. Fluid   GABAPENTIN (NEURONTIN) 600 MG TABLET    Take 600 mg by mouth at bedtime.   IBUPROFEN (ADVIL,MOTRIN) 200 MG TABLET    Take 400-600 mg by mouth every 8 (eight) hours as needed. Pain   INFLIXIMAB (REMICADE IV)    Inject into the vein. Every 8 weeks   INSULIN ASPART (NOVOLOG) 100 UNIT/ML INJECTION    Inject 10-15 Units into the skin 3 (three) times daily before meals. Inject  10Units subcutaneously every morning, 15 Units subcutaneously at lunchtime and 10 Units subcutaneously at bedtime as directed   INSULIN GLARGINE (LANTUS) 100 UNIT/ML INJECTION    Inject 80 Units into the skin 2 (two) times daily. Per patient 80 in am and 80 in pm.   LOSARTAN (COZAAR) 100 MG TABLET    Take 100 mg by mouth daily.   METFORMIN (GLUCOPHAGE-XR) 500 MG 24 HR TABLET    Take 2,000 mg by mouth daily.     METHOCARBAMOL (ROBAXIN) 750 MG TABLET    Take 750 mg by mouth every 8 (eight) hours as needed. Muscle Spasms   METHOTREXATE (RHEUMATREX) 2.5 MG TABLET    Take 20 mg by mouth once a week. Caution:Chemotherapy. Protect from light.   MULTIPLE VITAMINS-MINERALS (ICAPS MV PO)    Take 1 tablet by mouth 2 (two) times daily.   OMEPRAZOLE (PRILOSEC) 40 MG CAPSULE    TAKE 1 CAPSULE EVERY MORNING   PROAIR HFA 108 (90 BASE) MCG/ACT INHALER    INHALE 2 PUFFS INTO THE LUNGS EVERY 6 HOURS AS NEEDED FOR WHEEZING OR SHORTNESS OF BREATH   SIMVASTATIN (ZOCOR) 40 MG TABLET    Take 40 mg by  mouth daily with supper.    TIOTROPIUM (SPIRIVA) 18 MCG INHALATION CAPSULE    Place 18 mcg into inhaler and inhale daily.    Modified Medications   No medications on file  Discontinued Medications   LEVOFLOXACIN (LEVAQUIN) 500 MG TABLET    Take 1 tablet (500 mg total) by mouth daily.   METRONIDAZOLE (FLAGYL) 500 MG TABLET    Take 1 tablet (500 mg total) by mouth 3 (three) times daily.    Subjective: Ms. Goldmann is in for her routine follow-up visit. She is now completed 4 weeks of oral levofloxacin and metronidazole for possible chronic osteomyelitis of her distal, right great toe. She states that she is felt lousy ever since starting the antibiotics. Initially she was bothered by nausea, vomiting and diarrhea. This lasted about 2 and half weeks and then spontaneously improved. However she still feels awful and notes that she has no appetite and feels extremely fatigued. She tells me that Dr. Con Memos instructed her to stay off  of her right foot as much as possible. She feels like her toe is doing better but does not think it is due to the antibiotics.  Review of Systems: Review of Systems  Constitutional: Positive for malaise/fatigue and weight loss. Negative for chills, diaphoresis and fever.  Gastrointestinal: Positive for diarrhea, nausea and vomiting. Negative for abdominal pain.  Skin: Negative for rash.    Past Medical History:  Diagnosis Date  . Asthma   . Depression   . Diabetes mellitus   . GERD (gastroesophageal reflux disease)   . Hyperlipidemia   . Hypertension   . Morbid obesity ([place])   . Obstructive sleep apnea syndrome   . Osteoarthritis of right knee 04/2010   end-stage  . Rheumatoid arthritis(714.0)    on methotrexate therapy  . Septic arthritis of knee, right ([place]) 04/2010    Social History  Substance Use Topics  . Smoking status: Former Smoker    Packs/day: 2.00    Years: 25.00    Types: Cigarettes    Quit date: 04/11/1992  . Smokeless tobacco: Never Used  . Alcohol use No    Family History  Problem Relation Age of Onset  . Cancer Mother   . Hyperlipidemia Mother   . Diabetes Mother   . Diabetes Father   . COPD Father   . Hyperlipidemia Brother   . Hypertension Brother     Allergies  Allergen Reactions  . Demerol Nausea Only    Severe  . Hydromorphone Hcl Itching    All over the body.  . Tetracycline Hives    Objective: Vitals:   06/07/16 1122  BP: 135/72  Pulse: (!) 108  Temp: 98.1 F (36.7 C)  TempSrc: Oral  Weight: 278 lb (126.1 kg)   Body mass index is 50.85 kg/m.  Physical Exam  Constitutional: She is oriented to person, place, and time.  She tells me that she has lost 4 pounds according to her scales but her weight is actually up 5 pounds according to ours.  Abdominal: Soft. There is no tenderness.  Musculoskeletal:  Her right great toe is looking better. The callus on the tip of the toe is much less prominent. There is no redness, unusual  swelling, warmth or drainage.  Neurological: She is alert and oriented to person, place, and time.  Skin: No rash noted.  Psychiatric: Mood and affect normal.    Lab Results    Problem List Items Addressed This Visit      High  Diabetic foot ulcer ([place])    She does not have any evidence of active infection at this time. Her recent MRI suggested osteomyelitis of the distal toe but biopsies were benign and cultures were negative. She is not tolerating empiric antibiotics well. I think it is best to stop her antibiotics now and see how she does. She can follow-up here as needed.          Michel Bickers, MD [facility] for [facility] [place] [facility] [phone] pager   [phone] cell 06/07/2016, 11:54 AM

## 2016-06-08 DIAGNOSIS — M539 Dorsopathy, unspecified: Secondary | ICD-10-CM | POA: Diagnosis not present

## 2016-06-08 DIAGNOSIS — M0589 Other rheumatoid arthritis with rheumatoid factor of multiple sites: Secondary | ICD-10-CM | POA: Diagnosis not present

## 2016-06-08 DIAGNOSIS — Z79899 Other long term (current) drug therapy: Secondary | ICD-10-CM | POA: Diagnosis not present

## 2016-06-08 DIAGNOSIS — M150 Primary generalized (osteo)arthritis: Secondary | ICD-10-CM | POA: Diagnosis not present

## 2016-06-08 DIAGNOSIS — Z6841 Body Mass Index (BMI) 40.0 and over, adult: Secondary | ICD-10-CM | POA: Diagnosis not present

## 2016-06-14 ENCOUNTER — Encounter (HOSPITAL_BASED_OUTPATIENT_CLINIC_OR_DEPARTMENT_OTHER): Payer: Medicare Other | Attending: Surgery

## 2016-06-14 DIAGNOSIS — M0589 Other rheumatoid arthritis with rheumatoid factor of multiple sites: Secondary | ICD-10-CM | POA: Diagnosis not present

## 2016-06-14 DIAGNOSIS — Z09 Encounter for follow-up examination after completed treatment for conditions other than malignant neoplasm: Secondary | ICD-10-CM | POA: Insufficient documentation

## 2016-06-14 DIAGNOSIS — I10 Essential (primary) hypertension: Secondary | ICD-10-CM | POA: Insufficient documentation

## 2016-06-14 DIAGNOSIS — E1140 Type 2 diabetes mellitus with diabetic neuropathy, unspecified: Secondary | ICD-10-CM | POA: Insufficient documentation

## 2016-06-14 DIAGNOSIS — Z8631 Personal history of diabetic foot ulcer: Secondary | ICD-10-CM | POA: Insufficient documentation

## 2016-06-14 DIAGNOSIS — G4730 Sleep apnea, unspecified: Secondary | ICD-10-CM | POA: Diagnosis not present

## 2016-06-14 DIAGNOSIS — Z79899 Other long term (current) drug therapy: Secondary | ICD-10-CM | POA: Diagnosis not present

## 2016-06-14 DIAGNOSIS — J449 Chronic obstructive pulmonary disease, unspecified: Secondary | ICD-10-CM | POA: Insufficient documentation

## 2016-06-14 DIAGNOSIS — E11621 Type 2 diabetes mellitus with foot ulcer: Secondary | ICD-10-CM | POA: Diagnosis not present

## 2016-06-14 DIAGNOSIS — L97519 Non-pressure chronic ulcer of other part of right foot with unspecified severity: Secondary | ICD-10-CM | POA: Diagnosis not present

## 2016-06-20 ENCOUNTER — Encounter: Payer: Self-pay | Admitting: Pulmonary Disease

## 2016-06-20 ENCOUNTER — Ambulatory Visit (INDEPENDENT_AMBULATORY_CARE_PROVIDER_SITE_OTHER): Payer: Medicare Other | Admitting: Pulmonary Disease

## 2016-06-20 VITALS — BP 130/70 | HR 89 | Ht 62.0 in | Wt 267.6 lb

## 2016-06-20 DIAGNOSIS — Z9989 Dependence on other enabling machines and devices: Secondary | ICD-10-CM

## 2016-06-20 DIAGNOSIS — G4733 Obstructive sleep apnea (adult) (pediatric): Secondary | ICD-10-CM

## 2016-06-20 DIAGNOSIS — J449 Chronic obstructive pulmonary disease, unspecified: Secondary | ICD-10-CM | POA: Diagnosis not present

## 2016-06-20 DIAGNOSIS — J4489 Other specified chronic obstructive pulmonary disease: Secondary | ICD-10-CM

## 2016-06-20 NOTE — Progress Notes (Signed)
Current Outpatient Prescriptions on File Prior to Visit  Medication Sig  . ADVAIR HFA 115-21 MCG/ACT inhaler USE 2 INHALATIONS TWICE A DAY  . Albiglutide (TANZEUM) 30 MG PEN Inject 30 mg into the skin once a week.  . ALPRAZolam (XANAX) 0.5 MG tablet Take 0.25-0.5 mg by mouth 3 (three) times daily as needed for sleep or anxiety.  Marland Kitchen aspirin 81 MG tablet Take 81 mg by mouth daily.  . Calcium Carbonate-Vitamin D (CALCIUM 600+D) 600-400 MG-UNIT per tablet Take 1 tablet by mouth 2 (two) times daily.    . empagliflozin (JARDIANCE) 25 MG TABS tablet Take 25 mg by mouth daily.  . fexofenadine (ALLEGRA) 180 MG tablet Take 180 mg by mouth daily.    . fish oil-omega-3 fatty acids 1000 MG capsule Take 2 g by mouth every evening.   . folic acid (FOLVITE) 1 MG tablet Take 1 mg by mouth daily.  . furosemide (LASIX) 40 MG tablet Take 40 mg by mouth daily as needed for fluid. Fluid  . gabapentin (NEURONTIN) 600 MG tablet Take 600 mg by mouth at bedtime.  Marland Kitchen ibuprofen (ADVIL,MOTRIN) 200 MG tablet Take 400-600 mg by mouth every 8 (eight) hours as needed. Pain  . InFLIXimab (REMICADE IV) Inject into the vein. Every 8 weeks  . insulin aspart (NOVOLOG) 100 UNIT/ML injection Inject 10-15 Units into the skin 3 (three) times daily before meals. Inject 10Units subcutaneously every morning, 15 Units subcutaneously at lunchtime and 10 Units subcutaneously at bedtime as directed  . insulin glargine (LANTUS) 100 UNIT/ML injection Inject 80 Units into the skin 2 (two) times daily. Per patient 80 in am and 80 in pm.  . losartan (COZAAR) 100 MG tablet Take 100 mg by mouth daily.  . metFORMIN (GLUCOPHAGE-XR) 500 MG 24 hr tablet Take 2,000 mg by mouth daily.    . methocarbamol (ROBAXIN) 750 MG tablet Take 750 mg by mouth every 8 (eight) hours as needed. Muscle Spasms  . methotrexate (RHEUMATREX) 2.5 MG tablet Take 20 mg by mouth once a week. Caution:Chemotherapy. Protect from light.  . Multiple Vitamins-Minerals (ICAPS MV PO) Take  1 tablet by mouth 2 (two) times daily.  Marland Kitchen omeprazole (PRILOSEC) 40 MG capsule TAKE 1 CAPSULE EVERY MORNING  . PROAIR HFA 108 (90 BASE) MCG/ACT inhaler INHALE 2 PUFFS INTO THE LUNGS EVERY 6 HOURS AS NEEDED FOR WHEEZING OR SHORTNESS OF BREATH  . simvastatin (ZOCOR) 40 MG tablet Take 40 mg by mouth daily with supper.   . tiotropium (SPIRIVA) 18 MCG inhalation capsule Place 18 mcg into inhaler and inhale daily.     No current facility-administered medications on file prior to visit.      Chief Complaint  Patient presents with  . Follow-up    Wears CPAP nightly. Denies problems with mask or pressure. Pt states that she did not wear her home CPAP for one week due to traveling but she did wear her old machine that she travels with. DME: [facility]     Sleep tests PSG 09/02/07 >> AHI 9 CPAP 05/18/16 to 06/16/16 >> used on 23 of 30 nights with average 8 hrs 12 min.  Average AHI 0.4 with CPAP 12 cm H2O  Pulmonary tests PFT 09/18/07 >> FEV1 1.38 (61%), FEV1% 59, TLC 4.48 (95%), DLCO 80%, + BD  Past medical history DM, HLD, GERD, Asthma, HTN, RA, Depression  Past surgical hx, Medications, Allergies, Family hx, Social hx all reviewed.  Vital Signs BP 130/70 (BP Location: Left Arm, Cuff Size: Normal)  Pulse 89   Ht 5\' 2"  (1.575 m)   Wt 267 lb 9.6 oz (121.4 kg)   SpO2 98%   BMI 48.94 kg/m   History of Present Illness [name] is a 66 y.o. female former smoker with OSA and COPD.  No issues with CPAP.  Mask is comfortable.  Used her old machine for a week when travelling.  Denies cough, wheeze, sputum, or chest pain.  She had infection (diabetic foot ulcer), and had to come of her RA medications.  Recently restarted, and feeling better.  Doesn't feel her breathing limits her activity level.  Physical Exam  General - pleasant ENT - no sinus tenderness, no oral exudate, no LAN Cardiac - regular, no murmur Chest - no wheeze, rales Back - no tenderness Abd - soft, non tender Ext - mild  changes of RA in hands Neuro - normal strength Skin - no rashes Psych - normal mood  Assessment/Plan  COPD with chronic bronchitis. - continue spiriva, advair, and prn albuterol  Obstructive sleep apnea. - she is compliant with CPAP and reports benefit - continue CPAP 12 cm H2O  Obesity. - discussed importance of weight loss  Rheumatoid arthritis. - she is on remicade and MTX with Dr. Amil Amen    Patient Instructions  Follow up in 1 year   [name], MD [place] Pager:  [phone] 06/20/2016, 12:23 PM

## 2016-06-20 NOTE — Patient Instructions (Signed)
Follow up in 1 year.

## 2016-07-12 DIAGNOSIS — F419 Anxiety disorder, unspecified: Secondary | ICD-10-CM | POA: Diagnosis not present

## 2016-07-12 DIAGNOSIS — Z79899 Other long term (current) drug therapy: Secondary | ICD-10-CM | POA: Diagnosis not present

## 2016-07-12 DIAGNOSIS — E11649 Type 2 diabetes mellitus with hypoglycemia without coma: Secondary | ICD-10-CM | POA: Diagnosis not present

## 2016-07-12 DIAGNOSIS — J45909 Unspecified asthma, uncomplicated: Secondary | ICD-10-CM | POA: Diagnosis not present

## 2016-07-12 DIAGNOSIS — E785 Hyperlipidemia, unspecified: Secondary | ICD-10-CM | POA: Diagnosis not present

## 2016-07-15 DIAGNOSIS — L851 Acquired keratosis [keratoderma] palmaris et plantaris: Secondary | ICD-10-CM | POA: Diagnosis not present

## 2016-07-15 DIAGNOSIS — B351 Tinea unguium: Secondary | ICD-10-CM | POA: Diagnosis not present

## 2016-07-15 DIAGNOSIS — E1342 Other specified diabetes mellitus with diabetic polyneuropathy: Secondary | ICD-10-CM | POA: Diagnosis not present

## 2016-07-18 DIAGNOSIS — E785 Hyperlipidemia, unspecified: Secondary | ICD-10-CM | POA: Diagnosis not present

## 2016-07-18 DIAGNOSIS — E1149 Type 2 diabetes mellitus with other diabetic neurological complication: Secondary | ICD-10-CM | POA: Diagnosis not present

## 2016-07-18 DIAGNOSIS — I10 Essential (primary) hypertension: Secondary | ICD-10-CM | POA: Diagnosis not present

## 2016-07-18 DIAGNOSIS — Z6841 Body Mass Index (BMI) 40.0 and over, adult: Secondary | ICD-10-CM | POA: Diagnosis not present

## 2016-08-02 DIAGNOSIS — Z79899 Other long term (current) drug therapy: Secondary | ICD-10-CM | POA: Diagnosis not present

## 2016-08-02 DIAGNOSIS — M0589 Other rheumatoid arthritis with rheumatoid factor of multiple sites: Secondary | ICD-10-CM | POA: Diagnosis not present

## 2016-08-11 ENCOUNTER — Encounter: Payer: Self-pay | Admitting: Pulmonary Disease

## 2016-08-11 ENCOUNTER — Telehealth: Payer: Self-pay | Admitting: Pulmonary Disease

## 2016-08-11 DIAGNOSIS — G4733 Obstructive sleep apnea (adult) (pediatric): Secondary | ICD-10-CM

## 2016-08-11 NOTE — Telephone Encounter (Signed)
Patient also called office regarding this matter- please see 5/3 phone note for further info.  Will close encounter.

## 2016-08-11 NOTE — Telephone Encounter (Signed)
Spoke with the pt and notified of recs per VS  Order sent to [facility] to decrease CPAP from 12 to 10

## 2016-08-11 NOTE — Telephone Encounter (Signed)
Called and spoke to pt. Pt states she is requesting to have her CPAP pressure reduced to 10cm, she states a pressure of 12cm feels too much. A DL has been placed in VS look-ats.   Dr. Halford Chessman please advise. Thanks.

## 2016-08-11 NOTE — Telephone Encounter (Signed)
Okay to send order to change CPAP to 10 cm H2O

## 2016-09-13 DIAGNOSIS — M0589 Other rheumatoid arthritis with rheumatoid factor of multiple sites: Secondary | ICD-10-CM | POA: Diagnosis not present

## 2016-09-15 DIAGNOSIS — M25561 Pain in right knee: Secondary | ICD-10-CM | POA: Diagnosis not present

## 2016-09-15 DIAGNOSIS — M7062 Trochanteric bursitis, left hip: Secondary | ICD-10-CM | POA: Diagnosis not present

## 2016-09-15 DIAGNOSIS — M25562 Pain in left knee: Secondary | ICD-10-CM | POA: Diagnosis not present

## 2016-09-16 ENCOUNTER — Inpatient Hospital Stay (HOSPITAL_COMMUNITY): Payer: Medicare Other

## 2016-09-16 ENCOUNTER — Inpatient Hospital Stay (HOSPITAL_COMMUNITY)
Admission: EM | Admit: 2016-09-16 | Discharge: 2016-09-21 | DRG: 394 | Disposition: A | Discharge status: Home / Self Care | Payer: Medicare Other | Attending: Internal Medicine | Admitting: Internal Medicine

## 2016-09-16 ENCOUNTER — Encounter (HOSPITAL_COMMUNITY): Payer: Self-pay

## 2016-09-16 ENCOUNTER — Emergency Department (HOSPITAL_COMMUNITY): Payer: Medicare Other

## 2016-09-16 DIAGNOSIS — Z6841 Body Mass Index (BMI) 40.0 and over, adult: Secondary | ICD-10-CM

## 2016-09-16 DIAGNOSIS — E1142 Type 2 diabetes mellitus with diabetic polyneuropathy: Secondary | ICD-10-CM | POA: Diagnosis present

## 2016-09-16 DIAGNOSIS — E860 Dehydration: Secondary | ICD-10-CM | POA: Diagnosis present

## 2016-09-16 DIAGNOSIS — E785 Hyperlipidemia, unspecified: Secondary | ICD-10-CM | POA: Diagnosis present

## 2016-09-16 DIAGNOSIS — I10 Essential (primary) hypertension: Secondary | ICD-10-CM | POA: Diagnosis present

## 2016-09-16 DIAGNOSIS — F329 Major depressive disorder, single episode, unspecified: Secondary | ICD-10-CM | POA: Diagnosis present

## 2016-09-16 DIAGNOSIS — R109 Unspecified abdominal pain: Secondary | ICD-10-CM | POA: Diagnosis not present

## 2016-09-16 DIAGNOSIS — K430 Incisional hernia with obstruction, without gangrene: Secondary | ICD-10-CM | POA: Diagnosis present

## 2016-09-16 DIAGNOSIS — M069 Rheumatoid arthritis, unspecified: Secondary | ICD-10-CM | POA: Diagnosis present

## 2016-09-16 DIAGNOSIS — K432 Incisional hernia without obstruction or gangrene: Secondary | ICD-10-CM | POA: Diagnosis present

## 2016-09-16 DIAGNOSIS — Z96651 Presence of right artificial knee joint: Secondary | ICD-10-CM | POA: Diagnosis present

## 2016-09-16 DIAGNOSIS — J441 Chronic obstructive pulmonary disease with (acute) exacerbation: Secondary | ICD-10-CM | POA: Diagnosis present

## 2016-09-16 DIAGNOSIS — R7989 Other specified abnormal findings of blood chemistry: Secondary | ICD-10-CM

## 2016-09-16 DIAGNOSIS — D751 Secondary polycythemia: Secondary | ICD-10-CM | POA: Diagnosis present

## 2016-09-16 DIAGNOSIS — E872 Acidosis: Secondary | ICD-10-CM | POA: Diagnosis present

## 2016-09-16 DIAGNOSIS — Z4659 Encounter for fitting and adjustment of other gastrointestinal appliance and device: Secondary | ICD-10-CM

## 2016-09-16 DIAGNOSIS — R740 Nonspecific elevation of levels of transaminase and lactic acid dehydrogenase [LDH]: Secondary | ICD-10-CM | POA: Diagnosis not present

## 2016-09-16 DIAGNOSIS — K439 Ventral hernia without obstruction or gangrene: Secondary | ICD-10-CM | POA: Diagnosis not present

## 2016-09-16 DIAGNOSIS — R81 Glycosuria: Secondary | ICD-10-CM | POA: Diagnosis present

## 2016-09-16 DIAGNOSIS — Z7982 Long term (current) use of aspirin: Secondary | ICD-10-CM

## 2016-09-16 DIAGNOSIS — E119 Type 2 diabetes mellitus without complications: Secondary | ICD-10-CM | POA: Diagnosis not present

## 2016-09-16 DIAGNOSIS — J439 Emphysema, unspecified: Secondary | ICD-10-CM | POA: Diagnosis present

## 2016-09-16 DIAGNOSIS — Z794 Long term (current) use of insulin: Secondary | ICD-10-CM | POA: Diagnosis not present

## 2016-09-16 DIAGNOSIS — D72829 Elevated white blood cell count, unspecified: Secondary | ICD-10-CM | POA: Diagnosis present

## 2016-09-16 DIAGNOSIS — G4733 Obstructive sleep apnea (adult) (pediatric): Secondary | ICD-10-CM | POA: Diagnosis present

## 2016-09-16 DIAGNOSIS — Z885 Allergy status to narcotic agent status: Secondary | ICD-10-CM

## 2016-09-16 DIAGNOSIS — Z791 Long term (current) use of non-steroidal anti-inflammatories (NSAID): Secondary | ICD-10-CM

## 2016-09-16 DIAGNOSIS — Z9049 Acquired absence of other specified parts of digestive tract: Secondary | ICD-10-CM

## 2016-09-16 DIAGNOSIS — K56609 Unspecified intestinal obstruction, unspecified as to partial versus complete obstruction: Secondary | ICD-10-CM

## 2016-09-16 DIAGNOSIS — K219 Gastro-esophageal reflux disease without esophagitis: Secondary | ICD-10-CM | POA: Diagnosis present

## 2016-09-16 DIAGNOSIS — Z7951 Long term (current) use of inhaled steroids: Secondary | ICD-10-CM

## 2016-09-16 DIAGNOSIS — K660 Peritoneal adhesions (postprocedural) (postinfection): Secondary | ICD-10-CM | POA: Diagnosis present

## 2016-09-16 DIAGNOSIS — Z87891 Personal history of nicotine dependence: Secondary | ICD-10-CM | POA: Diagnosis not present

## 2016-09-16 DIAGNOSIS — R112 Nausea with vomiting, unspecified: Secondary | ICD-10-CM | POA: Diagnosis not present

## 2016-09-16 DIAGNOSIS — K56699 Other intestinal obstruction unspecified as to partial versus complete obstruction: Secondary | ICD-10-CM | POA: Diagnosis not present

## 2016-09-16 DIAGNOSIS — Z8249 Family history of ischemic heart disease and other diseases of the circulatory system: Secondary | ICD-10-CM

## 2016-09-16 DIAGNOSIS — K5669 Other partial intestinal obstruction: Secondary | ICD-10-CM | POA: Diagnosis not present

## 2016-09-16 DIAGNOSIS — R1110 Vomiting, unspecified: Secondary | ICD-10-CM | POA: Diagnosis not present

## 2016-09-16 DIAGNOSIS — Z4682 Encounter for fitting and adjustment of non-vascular catheter: Secondary | ICD-10-CM | POA: Diagnosis not present

## 2016-09-16 DIAGNOSIS — Z833 Family history of diabetes mellitus: Secondary | ICD-10-CM

## 2016-09-16 DIAGNOSIS — Z8349 Family history of other endocrine, nutritional and metabolic diseases: Secondary | ICD-10-CM

## 2016-09-16 DIAGNOSIS — Z90710 Acquired absence of both cervix and uterus: Secondary | ICD-10-CM

## 2016-09-16 DIAGNOSIS — Z79899 Other long term (current) drug therapy: Secondary | ICD-10-CM

## 2016-09-16 DIAGNOSIS — Z881 Allergy status to other antibiotic agents status: Secondary | ICD-10-CM

## 2016-09-16 LAB — URINALYSIS, ROUTINE W REFLEX MICROSCOPIC
Bilirubin Urine: NEGATIVE
Glucose, UA: >=500 mg/dL — AB
HGB URINE DIPSTICK: NEGATIVE
KETONES UR: NEGATIVE mg/dL
LEUKOCYTES UA: NEGATIVE
Nitrite: NEGATIVE
PH: 5 (ref 5.0–8.0)
Protein, ur: NEGATIVE mg/dL
Specific Gravity, Urine: 1.033 — ABNORMAL HIGH (ref 1.005–1.030)

## 2016-09-16 LAB — COMPREHENSIVE METABOLIC PANEL
ALT: 28 U/L (ref 14–54)
ANION GAP: 15 (ref 5–15)
AST: 25 U/L (ref 15–41)
Albumin: 4.4 g/dL (ref 3.5–5.0)
Alkaline Phosphatase: 85 U/L (ref 38–126)
BUN: 24 mg/dL — ABNORMAL HIGH (ref 6–20)
CHLORIDE: 100 mmol/L — AB (ref 101–111)
CO2: 23 mmol/L (ref 22–32)
Calcium: 10.4 mg/dL — ABNORMAL HIGH (ref 8.9–10.3)
Creatinine, Ser: 0.93 mg/dL (ref 0.44–1.00)
Glucose, Bld: 272 mg/dL — ABNORMAL HIGH (ref 65–99)
POTASSIUM: 4.3 mmol/L (ref 3.5–5.1)
Sodium: 138 mmol/L (ref 135–145)
Total Bilirubin: 0.6 mg/dL (ref 0.3–1.2)
Total Protein: 8.6 g/dL — ABNORMAL HIGH (ref 6.5–8.1)

## 2016-09-16 LAB — LIPASE, BLOOD: LIPASE: 32 U/L (ref 11–51)

## 2016-09-16 LAB — I-STAT CG4 LACTIC ACID, ED: LACTIC ACID, VENOUS: 4.48 mmol/L — AB (ref 0.5–1.9)

## 2016-09-16 LAB — GLUCOSE, CAPILLARY: GLUCOSE-CAPILLARY: 177 mg/dL — AB (ref 65–99)

## 2016-09-16 LAB — CBC
HEMATOCRIT: 47.4 % — AB (ref 36.0–46.0)
HEMOGLOBIN: 15.6 g/dL — AB (ref 12.0–15.0)
MCH: 26.7 pg (ref 26.0–34.0)
MCHC: 32.9 g/dL (ref 30.0–36.0)
MCV: 81.2 fL (ref 78.0–100.0)
Platelets: 291 10*3/uL (ref 150–400)
RBC: 5.84 MIL/uL — AB (ref 3.87–5.11)
RDW: 18.1 % — ABNORMAL HIGH (ref 11.5–15.5)
WBC: 21.5 10*3/uL — AB (ref 4.0–10.5)

## 2016-09-16 LAB — LACTIC ACID, PLASMA: LACTIC ACID, VENOUS: 2.1 mmol/L — AB (ref 0.5–1.9)

## 2016-09-16 MED ORDER — MORPHINE SULFATE (PF) 2 MG/ML IV SOLN
1.0000 mg | INTRAVENOUS | Status: DC | PRN
  Administered 2016-09-16 – 2016-09-17 (×4): 2 mg via INTRAVENOUS
  Filled 2016-09-16 (×4): qty 1

## 2016-09-16 MED ORDER — SODIUM CHLORIDE 0.9 % IV BOLUS (SEPSIS)
500.0000 mL | Freq: Once | INTRAVENOUS | Status: AC
  Administered 2016-09-16: 500 mL via INTRAVENOUS

## 2016-09-16 MED ORDER — INSULIN ASPART 100 UNIT/ML ~~LOC~~ SOLN
0.0000 [IU] | SUBCUTANEOUS | Status: DC
  Administered 2016-09-17: 3 [IU] via SUBCUTANEOUS

## 2016-09-16 MED ORDER — IOPAMIDOL (ISOVUE-300) INJECTION 61%
INTRAVENOUS | Status: AC
  Administered 2016-09-16: 19:00:00
  Filled 2016-09-16: qty 100

## 2016-09-16 MED ORDER — MOMETASONE FURO-FORMOTEROL FUM 200-5 MCG/ACT IN AERO
2.0000 | INHALATION_SPRAY | Freq: Two times a day (BID) | RESPIRATORY_TRACT | Status: DC
  Administered 2016-09-17 – 2016-09-21 (×9): 2 via RESPIRATORY_TRACT
  Filled 2016-09-16: qty 8.8

## 2016-09-16 MED ORDER — MORPHINE SULFATE (PF) 2 MG/ML IV SOLN
4.0000 mg | Freq: Once | INTRAVENOUS | Status: AC
  Administered 2016-09-16: 4 mg via INTRAVENOUS
  Filled 2016-09-16 (×2): qty 2

## 2016-09-16 MED ORDER — ACETAMINOPHEN 325 MG PO TABS: 650.0000 mg | ORAL_TABLET | Freq: Four times a day (QID) | ORAL | Status: DC | PRN

## 2016-09-16 MED ORDER — SODIUM CHLORIDE 0.9 % IV BOLUS (SEPSIS)
1000.0000 mL | Freq: Once | INTRAVENOUS | Status: AC
  Administered 2016-09-16: 1000 mL via INTRAVENOUS

## 2016-09-16 MED ORDER — MORPHINE SULFATE (PF) 2 MG/ML IV SOLN
4.0000 mg | Freq: Once | INTRAVENOUS | Status: AC
  Administered 2016-09-16: 4 mg via INTRAVENOUS
  Filled 2016-09-16: qty 2

## 2016-09-16 MED ORDER — LIDOCAINE VISCOUS 2 % MT SOLN
15.0000 mL | Freq: Once | OROMUCOSAL | Status: AC
  Administered 2016-09-16: 15 mL via OROMUCOSAL
  Filled 2016-09-16: qty 15

## 2016-09-16 MED ORDER — ACETAMINOPHEN 650 MG RE SUPP: 650.0000 mg | Freq: Four times a day (QID) | RECTAL | Status: DC | PRN

## 2016-09-16 MED ORDER — SODIUM CHLORIDE 0.9 % IV SOLN: INTRAVENOUS | Status: DC

## 2016-09-16 MED ORDER — ALBUTEROL SULFATE (2.5 MG/3ML) 0.083% IN NEBU: 2.5000 mg | INHALATION_SOLUTION | RESPIRATORY_TRACT | Status: DC | PRN

## 2016-09-16 MED ORDER — TIOTROPIUM BROMIDE MONOHYDRATE 18 MCG IN CAPS
18.0000 ug | ORAL_CAPSULE | Freq: Every day | RESPIRATORY_TRACT | Status: DC
  Administered 2016-09-17 – 2016-09-21 (×5): 18 ug via RESPIRATORY_TRACT
  Filled 2016-09-16: qty 5

## 2016-09-16 MED ORDER — ONDANSETRON HCL 4 MG/2ML IJ SOLN
4.0000 mg | Freq: Four times a day (QID) | INTRAMUSCULAR | Status: DC | PRN
  Administered 2016-09-17 (×2): 4 mg via INTRAVENOUS
  Filled 2016-09-16 (×2): qty 2

## 2016-09-16 MED ORDER — HYDRALAZINE HCL 20 MG/ML IJ SOLN
5.0000 mg | INTRAMUSCULAR | Status: DC | PRN
  Filled 2016-09-16: qty 0.5

## 2016-09-16 MED ORDER — INSULIN GLARGINE 100 UNIT/ML ~~LOC~~ SOLN
60.0000 [IU] | Freq: Two times a day (BID) | SUBCUTANEOUS | Status: DC
  Administered 2016-09-17 (×2): 60 [IU] via SUBCUTANEOUS
  Filled 2016-09-16 (×3): qty 0.6

## 2016-09-16 MED ORDER — ONDANSETRON HCL 4 MG/2ML IJ SOLN
4.0000 mg | Freq: Once | INTRAMUSCULAR | Status: AC
  Administered 2016-09-16: 4 mg via INTRAVENOUS
  Filled 2016-09-16: qty 2

## 2016-09-16 MED ORDER — PROMETHAZINE HCL 25 MG PO TABS: 12.5000 mg | ORAL_TABLET | Freq: Four times a day (QID) | ORAL | Status: DC | PRN

## 2016-09-16 MED ORDER — ALBUTEROL SULFATE HFA 108 (90 BASE) MCG/ACT IN AERS: 2.0000 | INHALATION_SPRAY | RESPIRATORY_TRACT | Status: DC | PRN

## 2016-09-16 MED ORDER — LORAZEPAM 2 MG/ML IJ SOLN
0.5000 mg | Freq: Four times a day (QID) | INTRAMUSCULAR | Status: DC | PRN
  Administered 2016-09-17 (×2): 1 mg via INTRAVENOUS
  Filled 2016-09-16 (×2): qty 1

## 2016-09-16 MED ORDER — SODIUM CHLORIDE 0.9 % IV BOLUS (SEPSIS): 1000.0000 mL | Freq: Once | INTRAVENOUS | Status: DC

## 2016-09-16 MED ORDER — PIPERACILLIN-TAZOBACTAM 3.375 G IVPB
3.3750 g | Freq: Three times a day (TID) | INTRAVENOUS | Status: DC
  Administered 2016-09-17 – 2016-09-21 (×13): 3.375 g via INTRAVENOUS
  Filled 2016-09-16 (×15): qty 50

## 2016-09-16 MED ORDER — SODIUM CHLORIDE 0.9 % IV SOLN
INTRAVENOUS | Status: AC
  Administered 2016-09-17: 100 mL/h via INTRAVENOUS

## 2016-09-16 MED ORDER — FOLIC ACID 5 MG/ML IJ SOLN
1.0000 mg | Freq: Every day | INTRAMUSCULAR | Status: DC
  Administered 2016-09-17 – 2016-09-21 (×5): 1 mg via INTRAVENOUS
  Filled 2016-09-16 (×6): qty 0.2

## 2016-09-16 MED ORDER — IOPAMIDOL (ISOVUE-300) INJECTION 61%
100.0000 mL | Freq: Once | INTRAVENOUS | Status: AC | PRN
  Administered 2016-09-16: 100 mL via INTRAVENOUS

## 2016-09-16 NOTE — Consult Note (Signed)
Reason for Consult:  Small Bowel Obstruction Referring Physician:  Dr. Daryll Brod is an 66 y.o. female.  HPI:  This is a 66 year old female with multiple medical problems and a long-standing giant ventral hernia presented to the emergency department was abdominal pain and small volume nausea and vomiting. She stated the right side of the hernia became abruptly larger after lunch today. She has a bowel movement since the onset of pain. No fever or chills. She presented to the emergency department for evaluation. She is noted to have a leukocytosis and elevated lactate. She is afebrile with normal heart rate and normal blood pressure. CT scan demonstrated a giant ventral hernia containing a lot of her small intestine and colon. There appeared to be a transition point somewhere around the hernia but no evidence of bowel ischemia or pneumoperitoneum. She has waves of cramping pain at times.  Her previous surgical history is notable for an emergency cholecystectomy by her report in the distant past. She didn't has incisional hernia. She had an incisional hernia repair which recurred. She then had an incisional hernia repair at mesh in approximately 3833 of complicated by a mesh infection. She states the mesh was removed and it took almost 6 years for the wound to completely heal. She's been left with a giant ventral hernia. She is morbidly obese and has diabetes. She currently takes Remicade for rheumatoid arthritis. She also has obstructive sleep apnea and significant COPD.  Past Medical History:  Diagnosis Date  . Asthma   . Depression   . Diabetes mellitus   . GERD (gastroesophageal reflux disease)   . Hyperlipidemia   . Hypertension   . Morbid obesity ([place])   . Obstructive sleep apnea syndrome   . Osteoarthritis of right knee 04/2010   end-stage  . Rheumatoid arthritis(714.0)    on methotrexate therapy  . Septic arthritis of knee, right ([place]) 04/2010    Past Surgical History:   Procedure Laterality Date  . ABDOMINAL HYSTERECTOMY  1992  . ABDOMINAL WALL MESH  REMOVAL  08/13/2009   with removal of retained stitches  . [place]   for cardiac tamponade  . APPENDECTOMY  1967  . BONE BIOPSY Right 04/19/2016   Procedure: BONE BIOPSY RIGHT GREAT TOE;  Surgeon: Caprice Beaver, DPM;  Location: [facility];  Service: Podiatry;  Laterality: Right;  . CHOLECYSTECTOMY  1992   laparoscopic  . COLONOSCOPY  12/08/2011   Procedure: COLONOSCOPY;  Surgeon: Rogene Houston, MD;  Location: [facility];  Service: Endoscopy;  Laterality: N/A;  830  . FOREIGN BODY REMOVAL ABDOMINAL  01/04/2010   stitch abscesses  . HERNIA REPAIR  2001   open LOA / [place] repair w mesh.  Dr. Arnoldo Morale  . HERNIA REPAIR  2007   open redo LOA / [place] repair w mesh.  Dr. Arnoldo Morale  . REVISION TOTAL KNEE ARTHROPLASTY  06/09/2010   I and D   . TOTAL KNEE ARTHROPLASTY  05/07/2010   right knee    Family History  Problem Relation Age of Onset  . Cancer Mother   . Hyperlipidemia Mother   . Diabetes Mother   . Diabetes Father   . COPD Father   . Hyperlipidemia Brother   . Hypertension Brother     Social History:  reports that she quit smoking about 24 years ago. Her smoking use included Cigarettes. She has a 50.00 pack-year smoking history. She has never used smokeless tobacco. She reports that she  does not drink alcohol or use drugs.  Allergies:  Allergies  Allergen Reactions  . Demerol Nausea Only    Severe  . Hydromorphone Hcl Itching    All over the body.  . Tetracycline Hives    Prior to Admission medications   Medication Sig Start Date End Date Taking? Authorizing Provider  ADVAIR HFA 115-21 MCG/ACT inhaler USE 2 INHALATIONS TWICE A DAY Patient taking differently: USE 2 INHALATIONS daily 11/30/15  Yes Sood, Elisabeth Cara, MD  Albiglutide (TANZEUM) 30 MG PEN Inject 30 mg into the skin once a week.   Yes [provider]  ALPRAZolam (XANAX) 0.5 MG tablet Take 0.25-0.5 mg  by mouth 3 (three) times daily as needed for sleep or anxiety.   Yes [provider]  aspirin 81 MG tablet Take 81 mg by mouth daily.   Yes [provider]  Calcium Carbonate-Vitamin D (CALCIUM 600+D) 600-400 MG-UNIT per tablet Take 1 tablet by mouth 2 (two) times daily.     Yes [provider]  empagliflozin (JARDIANCE) 25 MG TABS tablet Take 25 mg by mouth daily.   Yes [provider]  etodolac (LODINE) 400 MG tablet Take 400 mg by mouth 2 (two) times daily.   Yes [provider]  fexofenadine (ALLEGRA) 180 MG tablet Take 180 mg by mouth daily.     Yes [provider]  fish oil-omega-3 fatty acids 1000 MG capsule Take 2 g by mouth every evening.    Yes [provider]  folic acid (FOLVITE) 1 MG tablet Take 1 mg by mouth daily.   Yes [provider]  furosemide (LASIX) 40 MG tablet Take 40 mg by mouth daily as needed for fluid. Fluid   Yes [provider]  gabapentin (NEURONTIN) 600 MG tablet Take 600 mg by mouth at bedtime.   Yes [provider]  ibuprofen (ADVIL,MOTRIN) 200 MG tablet Take 400-600 mg by mouth every 8 (eight) hours as needed. Pain   Yes [provider]  InFLIXimab (REMICADE IV) Inject into the vein. Every 8 weeks   Yes [provider]  insulin aspart (NOVOLOG) 100 UNIT/ML injection Inject 18 Units into the skin 3 (three) times daily before meals. Inject 18 Units subcutaneously every morning, 18 Units subcutaneously at lunchtime IF NEEDED and 18 Units subcutaneously at bedtime as directed   Yes [provider]  insulin glargine (LANTUS) 100 UNIT/ML injection Inject 80 Units into the skin 2 (two) times daily. Per patient 80 in am and 80 in pm.   Yes [provider]  losartan (COZAAR) 100 MG tablet Take 100 mg by mouth daily.   Yes [provider]  metFORMIN (GLUCOPHAGE-XR) 500 MG 24 hr tablet Take 2,000 mg by mouth daily.     Yes [provider]  methocarbamol (ROBAXIN) 750 MG tablet Take 750 mg by mouth every 8 (eight) hours as needed. Muscle Spasms 07/20/10  Yes Michel Bickers, MD  methotrexate (RHEUMATREX) 2.5 MG tablet Take 20 mg by mouth once a week. Caution:Chemotherapy. Protect from light.   Yes [provider]  Multiple Vitamins-Minerals (ICAPS MV PO) Take 1 tablet by mouth 2 (two) times daily.   Yes [provider]  omeprazole (PRILOSEC) 40 MG capsule TAKE 1 CAPSULE EVERY MORNING 11/30/15  Yes [name], MD  PROAIR HFA 108 (90 BASE) MCG/ACT inhaler INHALE 2 PUFFS INTO THE LUNGS EVERY 6 HOURS AS NEEDED FOR WHEEZING OR SHORTNESS OF BREATH 03/10/14  Yes Clance, Armando Reichert, MD  simvastatin ([place])  40 MG tablet Take 40 mg by mouth daily with supper.    Yes [provider]  tiotropium (SPIRIVA) 18 MCG inhalation capsule Place 18 mcg into inhaler and inhale daily.     Yes [provider]     Results for orders placed or performed during the hospital encounter of 09/16/16 (from the past 48 hour(s))  Lipase, blood     Status: None   Collection Time: 09/16/16  5:16 PM  Result Value Ref Range   Lipase 32 11 - 51 U/L  Comprehensive metabolic panel     Status: Abnormal   Collection Time: 09/16/16  5:16 PM  Result Value Ref Range   Sodium 138 135 - 145 mmol/L   Potassium 4.3 3.5 - 5.1 mmol/L   Chloride 100 (L) 101 - 111 mmol/L   CO2 23 22 - 32 mmol/L   Glucose, Bld 272 (H) 65 - 99 mg/dL   BUN 24 (H) 6 - 20 mg/dL   Creatinine, Ser 0.93 0.44 - 1.00 mg/dL   Calcium 10.4 (H) 8.9 - 10.3 mg/dL   Total Protein 8.6 (H) 6.5 - 8.1 g/dL   Albumin 4.4 3.5 - 5.0 g/dL   AST 25 15 - 41 U/L   ALT 28 14 - 54 U/L   Alkaline Phosphatase 85 38 - 126 U/L   Total Bilirubin 0.6 0.3 - 1.2 mg/dL   GFR calc non Af Amer >60 >60 mL/min   GFR calc Af Amer >60 >60 mL/min    Comment: (NOTE) The eGFR has been calculated using the CKD EPI equation. This calculation has not been validated in all clinical  situations. eGFR's persistently <60 mL/min signify possible Chronic Kidney Disease.    Anion gap 15 5 - 15  CBC     Status: Abnormal   Collection Time: 09/16/16  5:16 PM  Result Value Ref Range   WBC 21.5 (H) 4.0 - 10.5 K/uL   RBC 5.84 (H) 3.87 - 5.11 MIL/uL   Hemoglobin 15.6 (H) 12.0 - 15.0 g/dL   HCT 47.4 (H) 36.0 - 46.0 %   MCV 81.2 78.0 - 100.0 fL   MCH 26.7 26.0 - 34.0 pg   MCHC 32.9 30.0 - 36.0 g/dL   RDW 18.1 (H) 11.5 - 15.5 %   Platelets 291 150 - 400 K/uL  I-Stat CG4 Lactic Acid, ED     Status: Abnormal   Collection Time: 09/16/16  5:40 PM  Result Value Ref Range   Lactic Acid, Venous 4.48 (HH) 0.5 - 1.9 mmol/L   Comment NOTIFIED PHYSICIAN   Urinalysis, Routine w reflex microscopic     Status: Abnormal   Collection Time: 09/16/16  5:56 PM  Result Value Ref Range   Color, Urine YELLOW YELLOW   APPearance CLEAR CLEAR   Specific Gravity, Urine 1.033 (H) 1.005 - 1.030   pH 5.0 5.0 - 8.0   Glucose, UA >=500 (A) NEGATIVE mg/dL   Hgb urine dipstick NEGATIVE NEGATIVE   Bilirubin Urine NEGATIVE NEGATIVE   Ketones, ur NEGATIVE NEGATIVE mg/dL   Protein, ur NEGATIVE NEGATIVE mg/dL   Nitrite NEGATIVE NEGATIVE   Leukocytes, UA NEGATIVE NEGATIVE   RBC / HPF 0-5 0 - 5 RBC/hpf   WBC, UA 0-5 0 - 5 WBC/hpf   Bacteria, UA RARE (A) NONE SEEN   Squamous Epithelial / LPF 0-5 (A) NONE SEEN   Budding Yeast PRESENT     Ct Abdomen Pelvis W Contrast  Addendum Date: 09/16/2016   ADDENDUM REPORT: 09/16/2016 21:58  ADDENDUM: History should read known abdominal wall hernia INSTEAD of abdominal aortic aneurysm. Electronically Signed   By: Margarette Canada M.D.   On: 09/16/2016 21:58   Result Date: 09/16/2016 CLINICAL DATA:  66 year old female with abdominal and pelvic pain and nausea and vomiting for 2 hours. Known abdominal aortic aneurysm EXAM: CT ABDOMEN AND PELVIS WITH CONTRAST TECHNIQUE: Multidetector CT imaging of the abdomen and pelvis was performed using the standard protocol following bolus  administration of intravenous contrast. CONTRAST:  130m ISOVUE-300 IOPAMIDOL (ISOVUE-300) INJECTION 61% COMPARISON:  01/21/2009 FINDINGS: Lower chest: No acute abnormality. Hepatobiliary: The liver is unremarkable. The patient is status post cholecystectomy. There is no evidence of biliary dilatation. Pancreas: Unremarkable Spleen: Unremarkable Adrenals/Urinary Tract: The kidneys, adrenal glands and bladder are unremarkable. Stomach/Bowel: A very large anterior abdominal/ pelvic wall hernia containing the majority of the small bowel and colon again noted. Dilated stool filled proximal and mid small bowel loops are noted with collapsed distal small bowel loops compatible with small bowel obstruction. The small bowel transition point lies within the lower aspect of the hernia without obstructing cause identified and may be related to an adhesion or internal hernia. There is no evidence of pneumoperitoneum Vascular/Lymphatic: Aortic atherosclerosis. No enlarged abdominal or pelvic lymph nodes. Reproductive: Status post hysterectomy. No adnexal masses. Other: No ascites or abscess. Musculoskeletal: No acute abnormality identified. Multilevel degenerative changes within the lumbar spine identified. IMPRESSION: High-grade small bowel obstruction occurring in the very large abdominal/ pelvic wall hernia. Transition point identified without identifiable obstructing cause - probably related to adhesion or internal hernia. No evidence of pneumoperitoneum or abscess. Aortic Atherosclerosis (ICD10-I70.0). Electronically Signed: By: JMargarette CanadaM.D. On: 09/16/2016 19:32   Dg Abd Portable 1v  Result Date: 09/16/2016 CLINICAL DATA:  NG tube placement EXAM: PORTABLE ABDOMEN - 1 VIEW COMPARISON:  CT abdomen pelvis 09/16/2016 FINDINGS: The side port of the nasogastric tube is near the gastroesophageal junction. Recommend advancing by 5-7 cm. IMPRESSION: NG tube side port at the GE junction. Recommend advancing by 5-7 cm.  Electronically Signed   By: KUlyses JarredM.D.   On: 09/16/2016 21:16    Review of Systems  Constitutional: Negative for chills and fever.  Respiratory: Negative for shortness of breath.   Cardiovascular: Positive for chest pain.  Gastrointestinal: Positive for abdominal pain, heartburn, nausea and vomiting.  Genitourinary: Negative for dysuria and hematuria.   Blood pressure 126/63, pulse 97, temperature 98 F (36.7 C), temperature source Oral, resp. rate 19, height _0  (1.575 m), weight 122.5 kg (270 lb), SpO2 97 %. Physical Exam  Constitutional:  Morbidly obese female in no acute distress. Pleasant and cooperative.  HENT:  Head: Normocephalic and atraumatic.  Nasogastric tube in draining yellowish material.  Eyes: No scleral icterus.  Cardiovascular: Normal rate and regular rhythm.   GI: Soft. There is tenderness.  There is a massive ventral hernia present begin at the midline and extending toward the right. There is very thin skin present and bowel loops are visible directly under the skin. There is a lower midline scar and a right-sided transverse scar present. There is no cellulitis present. The hernia is not reducible.  Musculoskeletal: She exhibits edema.  Neurological: She is alert.  Skin: Skin is warm and dry.  Psychiatric: She has a normal mood and affect. Her behavior is normal.    Assessment/Plan: 1. Small bowel obstruction most likely due to adhesive disease. It is concerning that she has a lactic acidosis and leukocytosis but it may be  from the nausea and vomiting and hydration status. No peritonitis on exam. No evidence of ischemic bowel on CT scan.  2. 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  3. COPD.  4. Rheumatoid arthritis on Remicade and methotrexate.  5.  Insulin-dependent type 2 diabetes mellitus.  6.  Obstructive sleep apnea.  7.  Morbid obesity.  Recommendation: Nonoperative management with hydration, NG tube decompression. She has loss  of domain so I don't think thre is a way to reduce this hernia as it would probably put her on lifelong chronic mechanical ventilation. She also has difficulty if any access to the abdominal cavity given her previous surgeries and giant hernia.  I think an operation on her would definitely to complications and possibly death. I believe an operation her would be an absolute last resort. She is aware of this.  Tayana Shankle J 09/16/2016, 10:20 PM

## 2016-09-16 NOTE — H&P (Signed)
History and Physical    [name] YSA:[id number] DOB: [birth year] DOA: 09/16/2016  PCP: Asencion Noble, MD   Patient coming from: Home  Chief Complaint: Severe abdominal pain, N/V  HPI: [name] is a 66 y.o. female with medical history significant for COPD, obstructive sleep apnea on CPAP, morbid obesity, insulin-dependent diabetes mellitus, and hypertension, now presenting to the emergency department with severe abdominal pain that began today.  Patient reports that she went to bed last night her usual state of health but woke this morning with a vague sense of fullness in her abdomen.  Over the course of the day, her pain progressively worsened, particularly after eating breakfast. She went on to develop nausea with nonbloody nonbilious vomiting. Pain continued to worsen and became severe, prompting her to seek evaluation in the emergency department.  Pain is described as severe, localized to the mid and lower abdomen, worse with food or movement, and with no alleviating factors identified. She took 2 tabs of Advil at home earlier in the day. She has had a normal bowel movement this morning. She denies fevers or chills and denies chest pain or palpitations. She reports that her COPD has been very stable with no recent cough or wheezing and no significant dyspnea.  ED Course: Upon arrival to the ED, patient is found to be  Afebrile, saturating well on room air, and with vital signs stable. Chemistry panel reveals a glucose of 272 and elevated BUN to creatinine ratio. CBC is notable for a leukocytosis to 21,500 and a polycythemia with hemoglobin of 15.6. Lactic acid is elevated to a value of 4.48. Urinalysis is notable for glucosuria and elevated specific gravity. CT of the abdomen and pelvis was obtained  And demonstrates a high-grade SBO occurring in the very large abdominal/pelvic wall hernia with  No pneumoperitoneum or abscess identified. Patient was given 2.5 L of normal saline in the emergency  department, 2 doses of 4 mg IV morphine, Zofran, and NG tube will be placed. Surgery was consulted by the ED physician. They agreed to see the patient in consultation but require a medical admission due to the patient's comorbidity. Patient remained hemodynamically stable in the ED and has not been in any respiratory distress. She'll be admitted to the medical-surgical unit for ongoing evaluation and management of high-grade SBO with elevated lactic acid, no fever, and without free air or abscess.  Review of Systems:  All other systems reviewed and apart from HPI, are negative.  Past Medical History:  Diagnosis Date  . Asthma   . Depression   . Diabetes mellitus   . GERD (gastroesophageal reflux disease)   . Hyperlipidemia   . Hypertension   . Morbid obesity ([place])   . Obstructive sleep apnea syndrome   . Osteoarthritis of right knee 04/2010   end-stage  . Rheumatoid arthritis(714.0)    on methotrexate therapy  . Septic arthritis of knee, right ([place]) 04/2010    Past Surgical History:  Procedure Laterality Date  . ABDOMINAL HYSTERECTOMY  1992  . ABDOMINAL WALL MESH  REMOVAL  08/13/2009   with removal of retained stitches  . [place]   for cardiac tamponade  . APPENDECTOMY  1967  . BONE BIOPSY Right 04/19/2016   Procedure: BONE BIOPSY RIGHT GREAT TOE;  Surgeon: Caprice Beaver, DPM;  Location: [facility];  Service: Podiatry;  Laterality: Right;  . CHOLECYSTECTOMY  1992   laparoscopic  . COLONOSCOPY  12/08/2011   Procedure: COLONOSCOPY;  Surgeon:  Rogene Houston, MD;  Location: [facility];  Service: Endoscopy;  Laterality: N/A;  830  . FOREIGN BODY REMOVAL ABDOMINAL  01/04/2010   stitch abscesses  . HERNIA REPAIR  2001   open LOA / [place] repair w mesh.  Dr. Arnoldo Morale  . HERNIA REPAIR  2007   open redo LOA / [place] repair w mesh.  Dr. Arnoldo Morale  . REVISION TOTAL KNEE ARTHROPLASTY  06/09/2010   I and D   . TOTAL KNEE ARTHROPLASTY  05/07/2010   right knee      reports that she quit smoking about 24 years ago. Her smoking use included Cigarettes. She has a 50.00 pack-year smoking history. She has never used smokeless tobacco. She reports that she does not drink alcohol or use drugs.  Allergies  Allergen Reactions  . Demerol Nausea Only    Severe  . Hydromorphone Hcl Itching    All over the body.  . Tetracycline Hives    Family History  Problem Relation Age of Onset  . Cancer Mother   . Hyperlipidemia Mother   . Diabetes Mother   . Diabetes Father   . COPD Father   . Hyperlipidemia Brother   . Hypertension Brother      Prior to Admission medications   Medication Sig Start Date End Date Taking? Authorizing Provider  ADVAIR HFA 115-21 MCG/ACT inhaler USE 2 INHALATIONS TWICE A DAY Patient taking differently: USE 2 INHALATIONS daily 11/30/15  Yes Sood, Elisabeth Cara, MD  Albiglutide (TANZEUM) 30 MG PEN Inject 30 mg into the skin once a week.   Yes [provider]  ALPRAZolam (XANAX) 0.5 MG tablet Take 0.25-0.5 mg by mouth 3 (three) times daily as needed for sleep or anxiety.   Yes [provider]  aspirin 81 MG tablet Take 81 mg by mouth daily.   Yes [provider]  Calcium Carbonate-Vitamin D (CALCIUM 600+D) 600-400 MG-UNIT per tablet Take 1 tablet by mouth 2 (two) times daily.     Yes [provider]  empagliflozin (JARDIANCE) 25 MG TABS tablet Take 25 mg by mouth daily.   Yes [provider]  etodolac (LODINE) 400 MG tablet Take 400 mg by mouth 2 (two) times daily.   Yes [provider]  fexofenadine (ALLEGRA) 180 MG tablet Take 180 mg by mouth daily.     Yes [provider]  fish oil-omega-3 fatty acids 1000 MG capsule Take 2 g by mouth every evening.    Yes [provider]  folic acid (FOLVITE) 1 MG tablet Take 1 mg by mouth daily.   Yes [provider]  furosemide (LASIX) 40 MG tablet Take 40 mg by mouth daily as needed for fluid. Fluid   Yes [provider]  gabapentin (NEURONTIN) 600 MG tablet Take 600 mg by mouth at bedtime.   Yes [provider]  ibuprofen (ADVIL,MOTRIN) 200 MG tablet Take 400-600 mg by mouth every 8 (eight) hours as needed. Pain   Yes [provider]  InFLIXimab (REMICADE IV) Inject into the vein. Every 8 weeks   Yes [provider]  insulin aspart (NOVOLOG) 100 UNIT/ML injection Inject 18 Units into the skin 3 (three) times daily before meals. Inject 18 Units subcutaneously every morning, 18 Units subcutaneously at lunchtime IF NEEDED and 18 Units subcutaneously at bedtime as directed   Yes [provider]  insulin glargine (LANTUS) 100 UNIT/ML injection Inject 80 Units into the skin 2 (two) times daily. Per patient 80 in am and  80 in pm.   Yes [provider]  losartan (COZAAR) 100 MG tablet Take 100 mg by mouth daily.   Yes [provider]  metFORMIN (GLUCOPHAGE-XR) 500 MG 24 hr tablet Take 2,000 mg by mouth daily.     Yes [provider]  methocarbamol (ROBAXIN) 750 MG tablet Take 750 mg by mouth every 8 (eight) hours as needed. Muscle Spasms 07/20/10  Yes Michel Bickers, MD  methotrexate (RHEUMATREX) 2.5 MG tablet Take 20 mg by mouth once a week. Caution:Chemotherapy. Protect from light.   Yes [provider]  Multiple Vitamins-Minerals (ICAPS MV PO) Take 1 tablet by mouth 2 (two) times daily.   Yes [provider]  omeprazole (PRILOSEC) 40 MG capsule TAKE 1 CAPSULE EVERY MORNING 11/30/15  Yes [name], MD  PROAIR HFA 108 (90 BASE) MCG/ACT inhaler INHALE 2 PUFFS INTO THE LUNGS EVERY 6 HOURS AS NEEDED FOR WHEEZING OR SHORTNESS OF BREATH 03/10/14  Yes Clance, Armando Reichert, MD  simvastatin (ZOCOR) 40 MG tablet Take 40 mg by mouth daily with supper.    Yes [provider]  tiotropium (SPIRIVA) 18 MCG inhalation capsule Place 18 mcg into inhaler and inhale daily.     Yes [provider]    Physical Exam: Vitals:   09/16/16 1618  09/16/16 1628 09/16/16 1916  BP: (!) 142/76  126/63  Pulse: 97  97  Resp: 16  19  Temp: 98 F (36.7 C)  98 F (36.7 C)  TempSrc: Oral  Oral  SpO2: 96%  97%  Weight:  122.5 kg (270 lb)   Height:  5\' 2"  (1.575 m)       Constitutional: NAD, calm, appears uncomfortable. Obese.  Eyes: PERTLA, lids and conjunctivae normal ENMT: Mucous membranes are moist. Posterior pharynx clear of any exudate or lesions.   Neck: normal, supple, no masses, no thyromegaly Respiratory: clear to auscultation bilaterally, no wheezing, no crackles. Normal respiratory effort.   Cardiovascular: S1 & S2 heard, regular rate and rhythm, soft systolic murmur at RUSB. No ankle edema. No significant JVD. Abdomen: No distension, soft, tender in lower abd and periumbillically. Occasional bowel sound appreciated.  Musculoskeletal: no clubbing / cyanosis. No joint deformity upper and lower extremities.   Skin: no significant rashes, lesions, ulcers. Warm, dry, well-perfused. Neurologic: CN 2-12 grossly intact. Sensation intact, DTR normal. Strength 5/5 in all 4 limbs.  Psychiatric: Alert and oriented x 3. Pleasant and cooperative.     Labs on Admission: I have personally reviewed following labs and imaging studies  CBC:  Recent Labs Lab 09/16/16 1716  WBC 21.5*  HGB 15.6*  HCT 47.4*  MCV 81.2  PLT 353   Basic Metabolic Panel:  Recent Labs Lab 09/16/16 1716  NA 138  K 4.3  CL 100*  CO2 23  GLUCOSE 272*  BUN 24*  CREATININE 0.93  CALCIUM 10.4*   GFR: Estimated Creatinine Clearance: 75.3 mL/min (by C-G formula based on SCr of 0.93 mg/dL). Liver Function Tests:  Recent Labs Lab 09/16/16 1716  AST 25  ALT 28  ALKPHOS 85  BILITOT 0.6  PROT 8.6*  ALBUMIN 4.4    Recent Labs Lab 09/16/16 1716  LIPASE 32   No results for input(s): AMMONIA in the last 168 hours. Coagulation Profile: No results for input(s): INR, PROTIME in the last 168 hours. Cardiac Enzymes: No results for input(s):  CKTOTAL, CKMB, CKMBINDEX, TROPONINI in the last 168 hours. BNP (last 3 results) No results for input(s): PROBNP in the last 8760  hours. HbA1C: No results for input(s): HGBA1C in the last 72 hours. CBG: No results for input(s): GLUCAP in the last 168 hours. Lipid Profile: No results for input(s): CHOL, HDL, LDLCALC, TRIG, CHOLHDL, LDLDIRECT in the last 72 hours. Thyroid Function Tests: No results for input(s): TSH, T4TOTAL, FREET4, T3FREE, THYROIDAB in the last 72 hours. Anemia Panel: No results for input(s): VITAMINB12, FOLATE, FERRITIN, TIBC, IRON, RETICCTPCT in the last 72 hours. Urine analysis:    Component Value Date/Time   COLORURINE YELLOW 09/16/2016 1756   APPEARANCEUR CLEAR 09/16/2016 1756   LABSPEC 1.033 (H) 09/16/2016 1756   PHURINE 5.0 09/16/2016 1756   GLUCOSEU >=500 (A) 09/16/2016 1756   HGBUR NEGATIVE 09/16/2016 1756   BILIRUBINUR NEGATIVE 09/16/2016 1756   KETONESUR NEGATIVE 09/16/2016 1756   PROTEINUR NEGATIVE 09/16/2016 1756   UROBILINOGEN 0.2 12/31/2012 1341   NITRITE NEGATIVE 09/16/2016 1756   LEUKOCYTESUR NEGATIVE 09/16/2016 1756   Sepsis Labs: @LABRCNTIP (procalcitonin:4,lacticidven:4) )No results found for this or any previous visit (from the past 240 hour(s)).   Radiological Exams on Admission: Ct Abdomen Pelvis W Contrast  Result Date: 09/16/2016 CLINICAL DATA:  66 year old female with abdominal and pelvic pain and nausea and vomiting for 2 hours. Known abdominal aortic aneurysm EXAM: CT ABDOMEN AND PELVIS WITH CONTRAST TECHNIQUE: Multidetector CT imaging of the abdomen and pelvis was performed using the standard protocol following bolus administration of intravenous contrast. CONTRAST:  134mL ISOVUE-300 IOPAMIDOL (ISOVUE-300) INJECTION 61% COMPARISON:  01/21/2009 FINDINGS: Lower chest: No acute abnormality. Hepatobiliary: The liver is unremarkable. The patient is status post cholecystectomy. There is no evidence of biliary dilatation. Pancreas: Unremarkable  Spleen: Unremarkable Adrenals/Urinary Tract: The kidneys, adrenal glands and bladder are unremarkable. Stomach/Bowel: A very large anterior abdominal/ pelvic wall hernia containing the majority of the small bowel and colon again noted. Dilated stool filled proximal and mid small bowel loops are noted with collapsed distal small bowel loops compatible with small bowel obstruction. The small bowel transition point lies within the lower aspect of the hernia without obstructing cause identified and may be related to an adhesion or internal hernia. There is no evidence of pneumoperitoneum Vascular/Lymphatic: Aortic atherosclerosis. No enlarged abdominal or pelvic lymph nodes. Reproductive: Status post hysterectomy. No adnexal masses. Other: No ascites or abscess. Musculoskeletal: No acute abnormality identified. Multilevel degenerative changes within the lumbar spine identified. IMPRESSION: High-grade small bowel obstruction occurring in the very large abdominal/ pelvic wall hernia. Transition point identified without identifiable obstructing cause - probably related to adhesion or internal hernia. No evidence of pneumoperitoneum or abscess. Aortic Atherosclerosis (ICD10-I70.0). Electronically Signed   By: Margarette Canada M.D.   On: 09/16/2016 19:32    EKG: Ordered and pending.   Assessment/Plan  1. High-grade SBO  - Pt presents with severe abdominal pain and N/V that began the day of presentation - She has extensive abdominal surgical hx including hernia repairs  - Marked leukocytosis and elevated lactate noted without fever; CT with high-grade obstruction in the large ventral hernia, likely secondary to adhesions, with no free air or abscess identified - Surgery is consulting and much appreciated; will follow-up on recommendations  - Plan for now to place NGT, keep NPO, continue pain-control and antiemetics, provide gentle IVF hydration, serial exams    2. COPD  - Patient reports this to be stable, no  increased dyspnea or cough - Continue Spiriva, Dulera, prn albuterol  3. Insulin-dependent DM  - A1c 7.0% in 2012 - Manage at home with Jardiance, abligutide, Novolog 18 units TID, and Lantus  80 units BID - Check CBG q4h while NPO - Continue Lantus at 60 units BID with a moderate-intensity NovoLog sliding-scale  4. Rheumatoid arthritis  - Patient reports this to be stable - Managed at home with Remicade and methotrexate - Continue as needed pain-control as above   5. OSA on CPAP - Tolerating CPAP at home, will continue   6. Hypertension - Managed at home with losartan, currently held in light of nothing by mouth status and potential surgery - Treat with hydralazine IVPs as needed    DVT prophylaxis: SCD's  Code Status: Full  Family Communication: Daughter (POA) updated at bedside Disposition Plan: Admit to med-surg Consults called: Surgery Admission status: Inpatient    Vianne Bulls, MD [facility] Pager [phone]  If 7PM-7AM, please contact night-coverage [email or web address] Password [facility]  09/16/2016, 8:53 PM

## 2016-09-16 NOTE — ED Triage Notes (Signed)
Patient reports that she has a hernia with progressing pain. N/V started 2 hours ago.

## 2016-09-16 NOTE — ED Provider Notes (Signed)
[facility] DEPT Provider Note   CSN: [id number] Arrival date & time: 09/16/16  1605     History   Chief Complaint Chief Complaint  Patient presents with  . Abdominal Pain  . Emesis    HPI BHAVYA ESCHETE is a 66 y.o. female.  CARLOTTA TELFAIR is a 66 y.o. Female with history of an appendectomy, cholecystectomy , hernia repair and previous bowel stimulation he presents to the emergency department complaining of onset of abdominal pain to her right abdomen this morning. She reports associated nausea and vomiting. She's had previous hernia repairs in his required removal of the mass due to infection. She reports today her symptoms feel similar to when she had bowel strangulation. She reports her abdomen is more distended than normal. She reports vomiting spit up. She denies any diarrhea. She is passing gas. She denies urinary symptoms. She did ibuprofen prior to arrival. Last ate around 12:30 PM. Patient denies fevers, chest pain, shortness of breath, urinary symptoms, hematemesis, or rashes.   The history is provided by the patient and medical records. No language interpreter was used.  Abdominal Pain   Associated symptoms include nausea and vomiting. Pertinent negatives include fever, diarrhea, dysuria, frequency and headaches.  Emesis   Associated symptoms include abdominal pain. Pertinent negatives include no chills, no cough, no diarrhea, no fever and no headaches.    Past Medical History:  Diagnosis Date  . Asthma   . Depression   . Diabetes mellitus   . GERD (gastroesophageal reflux disease)   . Hyperlipidemia   . Hypertension   . Morbid obesity ([place])   . Obstructive sleep apnea syndrome   . Osteoarthritis of right knee 04/2010   end-stage  . Rheumatoid arthritis(714.0)    on methotrexate therapy  . Septic arthritis of knee, right ([place]) 04/2010    Patient Active Problem List   Diagnosis Date Noted  . Small bowel obstruction ([place]) 09/16/2016  . Insulin-requiring or  dependent type II diabetes mellitus ([place]) 09/16/2016  . Rheumatoid arthritis ([place]) 09/16/2016  . Diabetic foot ulcer ([place]) 05/10/2016  . 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 Recurrent ventral incisional hernia 11/01/2010  . 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 OTH COMPLICATIONS DUE INTERNAL JOINT PROSTHESIS 06/09/2010  . CARDIAC MURMUR 05/29/2009  . Obstructive sleep apnea 08/21/2007  . 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 DM 08/20/2007  . HYPERLIPIDEMIA 08/20/2007  . MORBID OBESITY 08/20/2007    Past Surgical History:  Procedure Laterality Date  . ABDOMINAL HYSTERECTOMY  1992  . ABDOMINAL WALL MESH  REMOVAL  08/13/2009   with removal of retained stitches  . [place]   for cardiac tamponade  . APPENDECTOMY  1967  . BONE BIOPSY Right 04/19/2016   Procedure: BONE BIOPSY RIGHT GREAT TOE;  Surgeon: Caprice Beaver, DPM;  Location: [facility] ORS;  Service: Podiatry;  Laterality: Right;  . CHOLECYSTECTOMY  1992   laparoscopic  . COLONOSCOPY  12/08/2011   Procedure: COLONOSCOPY;  Surgeon: Rogene Houston, MD;  Location: [facility] ENDO SUITE;  Service: Endoscopy;  Laterality: N/A;  830  . FOREIGN BODY REMOVAL ABDOMINAL  01/04/2010   stitch abscesses  . HERNIA REPAIR  2001   open LOA / [place] repair w mesh.  Dr. Arnoldo Morale  . HERNIA REPAIR  2007   open redo LOA / [place] repair w mesh.  Dr. Arnoldo Morale  . REVISION TOTAL KNEE ARTHROPLASTY  06/09/2010  I and D   . TOTAL KNEE ARTHROPLASTY  05/07/2010   right knee    OB History    No data available       Home Medications    Prior to Admission medications   Medication Sig Start Date End Date Taking? Authorizing Provider  ADVAIR HFA 115-21 MCG/ACT inhaler USE 2 INHALATIONS TWICE A DAY Patient taking differently: USE 2 INHALATIONS daily 11/30/15  Yes Sood, Elisabeth Cara, MD  Albiglutide (TANZEUM) 30 MG PEN Inject 30 mg into the skin once a week.   Yes  [provider]  ALPRAZolam (XANAX) 0.5 MG tablet Take 0.25-0.5 mg by mouth 3 (three) times daily as needed for sleep or anxiety.   Yes [provider]  aspirin 81 MG tablet Take 81 mg by mouth daily.   Yes [provider]  Calcium Carbonate-Vitamin D (CALCIUM 600+D) 600-400 MG-UNIT per tablet Take 1 tablet by mouth 2 (two) times daily.     Yes [provider]  empagliflozin (JARDIANCE) 25 MG TABS tablet Take 25 mg by mouth daily.   Yes [provider]  etodolac (LODINE) 400 MG tablet Take 400 mg by mouth 2 (two) times daily.   Yes [provider]  fexofenadine (ALLEGRA) 180 MG tablet Take 180 mg by mouth daily.     Yes [provider]  fish oil-omega-3 fatty acids 1000 MG capsule Take 2 g by mouth every evening.    Yes [provider]  folic acid (FOLVITE) 1 MG tablet Take 1 mg by mouth daily.   Yes [provider]  furosemide (LASIX) 40 MG tablet Take 40 mg by mouth daily as needed for fluid. Fluid   Yes [provider]  gabapentin (NEURONTIN) 600 MG tablet Take 600 mg by mouth at bedtime.   Yes [provider]  ibuprofen (ADVIL,MOTRIN) 200 MG tablet Take 400-600 mg by mouth every 8 (eight) hours as needed. Pain   Yes [provider]  InFLIXimab (REMICADE IV) Inject into the vein. Every 8 weeks   Yes [provider]  insulin aspart (NOVOLOG) 100 UNIT/ML injection Inject 18 Units into the skin 3 (three) times daily before meals. Inject 18 Units subcutaneously every morning, 18 Units subcutaneously at lunchtime IF NEEDED and 18 Units subcutaneously at bedtime as directed   Yes [provider]  insulin glargine (LANTUS) 100 UNIT/ML injection Inject 80 Units into the skin 2 (two) times daily. Per patient 80 in am and 80 in pm.   Yes [provider]  losartan (COZAAR) 100 MG tablet Take 100 mg by mouth daily.   Yes [provider]  metFORMIN (GLUCOPHAGE-XR) 500  MG 24 hr tablet Take 2,000 mg by mouth daily.     Yes [provider]  methocarbamol (ROBAXIN) 750 MG tablet Take 750 mg by mouth every 8 (eight) hours as needed. Muscle Spasms 07/20/10  Yes Michel Bickers, MD  methotrexate (RHEUMATREX) 2.5 MG tablet Take 20 mg by mouth once a week. Caution:Chemotherapy. Protect from light.   Yes [provider]  Multiple Vitamins-Minerals (ICAPS MV PO) Take 1 tablet by mouth 2 (two) times daily.   Yes [provider]  omeprazole (PRILOSEC) 40 MG capsule TAKE 1 CAPSULE EVERY MORNING 11/30/15  Yes [name], MD  PROAIR HFA 108 (90 BASE) MCG/ACT inhaler INHALE 2 PUFFS INTO THE LUNGS EVERY 6 HOURS AS NEEDED FOR WHEEZING OR SHORTNESS OF BREATH 03/10/14  Yes Clance, Armando Reichert, MD  simvastatin (ZOCOR) 40 MG tablet Take 40  mg by mouth daily with supper.    Yes [provider]  tiotropium (SPIRIVA) 18 MCG inhalation capsule Place 18 mcg into inhaler and inhale daily.     Yes [provider]    Family History Family History  Problem Relation Age of Onset  . Cancer Mother   . Hyperlipidemia Mother   . Diabetes Mother   . Diabetes Father   . COPD Father   . Hyperlipidemia Brother   . Hypertension Brother     Social History Social History  Substance Use Topics  . Smoking status: Former Smoker    Packs/day: 2.00    Years: 25.00    Types: Cigarettes    Quit date: 04/11/1992  . Smokeless tobacco: Never Used  . Alcohol use No     Allergies   Demerol; Hydromorphone hcl; and Tetracycline   Review of Systems Review of Systems  Constitutional: Negative for chills and fever.  HENT: Negative for congestion and sore throat.   Eyes: Negative for visual disturbance.  Respiratory: Negative for cough, shortness of breath and wheezing.   Cardiovascular: Negative for chest pain and palpitations.  Gastrointestinal: Positive for abdominal distention, abdominal pain, nausea and vomiting. Negative for blood in stool and  diarrhea.  Genitourinary: Negative for difficulty urinating, dysuria, frequency and urgency.  Musculoskeletal: Negative for back pain and neck pain.  Skin: Negative for rash.  Neurological: Negative for headaches.     Physical Exam Updated Vital Signs BP (!) 120/54 (BP Location: Left Arm)   Pulse 95   Temp 98.7 F (37.1 C) (Oral)   Resp 20   Ht 5\' 2"  (1.575 m)   Wt 122.5 kg (270 lb)   SpO2 97%   BMI 49.38 kg/m   Physical Exam  Constitutional: She is oriented to person, place, and time. She appears well-developed and well-nourished. No distress.  Nontoxic appearing. Obese.   HENT:  Head: Normocephalic and atraumatic.  Mouth/Throat: Oropharynx is clear and moist.  Eyes: Conjunctivae are normal. Pupils are equal, round, and reactive to light. Right eye exhibits no discharge. Left eye exhibits no discharge.  Neck: Neck supple.  Cardiovascular: Normal rate, regular rhythm, normal heart sounds and intact distal pulses.  Exam reveals no gallop and no friction rub.   No murmur heard. Pulmonary/Chest: Effort normal and breath sounds normal. No respiratory distress. She has no wheezes. She has no rales.  Lungs are clear to ascultation bilaterally. Symmetric chest expansion bilaterally. No increased work of breathing. No rales or rhonchi.    Abdominal: Soft. Bowel sounds are normal. She exhibits distension. There is tenderness.  Abdomen is soft. Bowel sounds are present. Patient has significant abdominal distention mostly to her right abdomen with previous scarring from previous surgeries. Tenderness overlying her right abdomen. Large ventral hernia. She has visible and palpable bowel loops in her right abdomen.   Musculoskeletal: She exhibits no edema.  Lymphadenopathy:    She has no cervical adenopathy.  Neurological: She is alert and oriented to person, place, and time. Coordination normal.  Skin: Skin is warm and dry. Capillary refill takes less than 2 seconds. No rash noted. She is  not diaphoretic. No erythema. No pallor.  Psychiatric: She has a normal mood and affect. Her behavior is normal.  Nursing note and vitals reviewed.    ED Treatments / Results  Labs (all labs ordered are listed, but only abnormal results are displayed) Labs Reviewed  COMPREHENSIVE METABOLIC PANEL - Abnormal; Notable for the following:  Result Value   Chloride 100 (*)    Glucose, Bld 272 (*)    BUN 24 (*)    Calcium 10.4 (*)    Total Protein 8.6 (*)    All other components within normal limits  CBC - Abnormal; Notable for the following:    WBC 21.5 (*)    RBC 5.84 (*)    Hemoglobin 15.6 (*)    HCT 47.4 (*)    RDW 18.1 (*)    All other components within normal limits  URINALYSIS, ROUTINE W REFLEX MICROSCOPIC - Abnormal; Notable for the following:    Specific Gravity, Urine 1.033 (*)    Glucose, UA >=500 (*)    Bacteria, UA RARE (*)    Squamous Epithelial / LPF 0-5 (*)    All other components within normal limits  LACTIC ACID, PLASMA - Abnormal; Notable for the following:    Lactic Acid, Venous 2.1 (*)    All other components within normal limits  GLUCOSE, CAPILLARY - Abnormal; Notable for the following:    Glucose-Capillary 177 (*)    All other components within normal limits  I-STAT CG4 LACTIC ACID, ED - Abnormal; Notable for the following:    Lactic Acid, Venous 4.48 (*)    All other components within normal limits  CULTURE, BLOOD (ROUTINE X 2)  CULTURE, BLOOD (ROUTINE X 2)  LIPASE, BLOOD  HEMOGLOBIN A1C  CBC WITH DIFFERENTIAL/PLATELET  BASIC METABOLIC PANEL    EKG  EKG Interpretation None       Radiology Ct Abdomen Pelvis W Contrast  Addendum Date: 09/16/2016   ADDENDUM REPORT: 09/16/2016 21:58 ADDENDUM: History should read known abdominal wall hernia INSTEAD of abdominal aortic aneurysm. Electronically Signed   By: Margarette Canada M.D.   On: 09/16/2016 21:58   Result Date: 09/16/2016 CLINICAL DATA:  66 year old female with abdominal and pelvic pain and  nausea and vomiting for 2 hours. Known abdominal aortic aneurysm EXAM: CT ABDOMEN AND PELVIS WITH CONTRAST TECHNIQUE: Multidetector CT imaging of the abdomen and pelvis was performed using the standard protocol following bolus administration of intravenous contrast. CONTRAST:  142mL ISOVUE-300 IOPAMIDOL (ISOVUE-300) INJECTION 61% COMPARISON:  01/21/2009 FINDINGS: Lower chest: No acute abnormality. Hepatobiliary: The liver is unremarkable. The patient is status post cholecystectomy. There is no evidence of biliary dilatation. Pancreas: Unremarkable Spleen: Unremarkable Adrenals/Urinary Tract: The kidneys, adrenal glands and bladder are unremarkable. Stomach/Bowel: A very large anterior abdominal/ pelvic wall hernia containing the majority of the small bowel and colon again noted. Dilated stool filled proximal and mid small bowel loops are noted with collapsed distal small bowel loops compatible with small bowel obstruction. The small bowel transition point lies within the lower aspect of the hernia without obstructing cause identified and may be related to an adhesion or internal hernia. There is no evidence of pneumoperitoneum Vascular/Lymphatic: Aortic atherosclerosis. No enlarged abdominal or pelvic lymph nodes. Reproductive: Status post hysterectomy. No adnexal masses. Other: No ascites or abscess. Musculoskeletal: No acute abnormality identified. Multilevel degenerative changes within the lumbar spine identified. IMPRESSION: High-grade small bowel obstruction occurring in the very large abdominal/ pelvic wall hernia. Transition point identified without identifiable obstructing cause - probably related to adhesion or internal hernia. No evidence of pneumoperitoneum or abscess. Aortic Atherosclerosis (ICD10-I70.0). Electronically Signed: By: Margarette Canada M.D. On: 09/16/2016 19:32   Dg Abd Portable 1v  Result Date: 09/16/2016 CLINICAL DATA:  NG tube placement EXAM: PORTABLE ABDOMEN - 1 VIEW COMPARISON:  CT  abdomen pelvis 09/16/2016 FINDINGS: The side port of  the nasogastric tube is near the gastroesophageal junction. Recommend advancing by 5-7 cm. IMPRESSION: NG tube side port at the GE junction. Recommend advancing by 5-7 cm. Electronically Signed   By: Ulyses Jarred M.D.   On: 09/16/2016 21:16    Procedures Procedures (including critical care time)  CRITICAL CARE Performed by: Hanley Hays   Total critical care time: 45 minutes  Critical care time was exclusive of separately billable procedures and treating other patients.  Critical care was necessary to treat or prevent imminent or life-threatening deterioration.  Critical care was time spent personally by me on the following activities: development of treatment plan with patient and/or surrogate as well as nursing, discussions with consultants, evaluation of patient's response to treatment, examination of patient, obtaining history from patient or surrogate, ordering and performing treatments and interventions, ordering and review of laboratory studies, ordering and review of radiographic studies, pulse oximetry and re-evaluation of patient's condition.   Medications Ordered in ED Medications  morphine 2 MG/ML injection 1-3 mg (2 mg Intravenous Given 09/16/16 2254)  ondansetron (ZOFRAN) injection 4 mg (not administered)  0.9 %  sodium chloride infusion (100 mL/hr Intravenous New Bag/Given 09/17/16 0111)  mometasone-formoterol (DULERA) 200-5 MCG/ACT inhaler 2 puff (not administered)  tiotropium (SPIRIVA) inhalation capsule 18 mcg (not administered)  insulin glargine (LANTUS) injection 60 Units (60 Units Subcutaneous Given 09/17/16 0114)  insulin aspart (novoLOG) injection 0-15 Units (3 Units Subcutaneous Given 09/17/16 0113)  acetaminophen (TYLENOL) tablet 650 mg (not administered)    Or  acetaminophen (TYLENOL) suppository 650 mg (not administered)  promethazine (PHENERGAN) tablet 12.5 mg (not administered)  folic acid injection 1  mg (not administered)  LORazepam (ATIVAN) injection 0.5-1 mg (not administered)  hydrALAZINE (APRESOLINE) injection 5-10 mg (not administered)  piperacillin-tazobactam (ZOSYN) IVPB 3.375 g (not administered)  albuterol (PROVENTIL) (2.5 MG/3ML) 0.083% nebulizer solution 2.5 mg (not administered)  ondansetron (ZOFRAN) injection 4 mg (4 mg Intravenous Given 09/16/16 1802)  morphine 2 MG/ML injection 4 mg (4 mg Intravenous Given 09/16/16 1802)  sodium chloride 0.9 % bolus 500 mL (0 mLs Intravenous Stopped 09/16/16 1838)  sodium chloride 0.9 % bolus 1,000 mL (0 mLs Intravenous Stopped 09/16/16 1856)  morphine 2 MG/ML injection 4 mg (4 mg Intravenous Given 09/16/16 1935)  iopamidol (ISOVUE-300) 61 % injection (  Contrast Given 09/16/16 1922)  iopamidol (ISOVUE-300) 61 % injection 100 mL (100 mLs Intravenous Contrast Given 09/16/16 1850)  sodium chloride 0.9 % bolus 1,000 mL (0 mLs Intravenous Stopped 09/16/16 2213)  morphine 2 MG/ML injection 4 mg (4 mg Intravenous Given 09/16/16 2013)  lidocaine (XYLOCAINE) 2 % viscous mouth solution 15 mL (15 mLs Mouth/Throat Given 09/16/16 2014)    The patient is noted to have a lactate>4. With the current information available to me, I don't think the patient is in septic shock. The lactate>4, is related to bowel strangulation, vomiting or dehyration.   Initial Impression / Assessment and Plan / ED Course  I have reviewed the triage vital signs and the nursing notes.  Pertinent labs & imaging results that were available during my care of the patient were reviewed by me and considered in my medical decision making (see chart for details).     This is a 66 y.o. Female with history of an appendectomy, cholecystectomy , hernia repair and previous bowel stimulation he presents to the emergency department complaining of onset of abdominal pain to her right abdomen this morning. She reports associated nausea and vomiting. She's had previous hernia repairs in his  required removal of the  mass due to infection. She reports today her symptoms feel similar to when she had bowel strangulation. She reports her abdomen is more distended than normal. She reports vomiting spit up. She denies any diarrhea. She is passing gas. She denies urinary symptoms. She did ibuprofen prior to arrival. Last ate around 12:30 PM. On exam the patient is afebrile and nontoxic appearing. She is morbidly obese. She has a large ventral hernia with visible and palpable bowel loops [place] skin. She is tenderness diffusely to her right abdomen. No hard masses.   Blood work returned revealing a white count of 21,000, preserved renal function and a creatinine of 0.93 and a lactic acid of 4.48. His lactic acid is most likely related to vomiting, dehydration or bowel stimulation. This does not appear to be sepsis. She is afebrile and nontoxic appearing on exam. We are awaiting stat CT scan.  CT abdomen and pelvis with contrast is a high-grade small bowel obstruction occurring in the very large abdominal, pelvic wall hernia. No evidence of pneumoperitoneum or abscess.  I consulted with surgeon Dr. Barkley Bruns. He would like [place] to place a NG tube, and admit the patient to medicine. He will need help with her other medical comorbidities. He will be by to see the patient shortly. Patient agrees with plan. She agrees with plan for admission. She is still well appearing and reports her pain is much better after pain medication.   I consulted with hospitalist service who accepted the patient for admission.   This patient was discussed with and evaluated by Dr. Leonette Monarch who agrees with assessment and plan.  Final Clinical Impressions(s) / ED Diagnoses   Final diagnoses:  Small bowel obstruction ([place])  Non-intractable vomiting with nausea, unspecified vomiting type  Elevated lactic acid level    New Prescriptions Current Discharge Medication List       Waynetta Pean, Hershal Coria 09/17/16 [id number]

## 2016-09-16 NOTE — ED Provider Notes (Signed)
Medical screening examination/treatment/procedure(s) were conducted as a shared visit with non-physician practitioner(s) and myself.  I personally evaluated the patient during the encounter. Briefly, the patient is a 66 y.o. female with history of ventral hernia presents ED with sudden onset of abdominal pain and distention. Workup consistent with a high-grade small bowel obstruction. Elevated lactic acid due to likely bowel strangulation. No infectious symptoms that would be concerning for sepsis. Case discussed with surgery who recommended aggressive IV hydration and NG tube placement. They requested medicine admission and they will come and evaluate the patient. Patient admitted for further management.     Fatima Blank, MD 09/17/16 [phone]

## 2016-09-17 ENCOUNTER — Inpatient Hospital Stay (HOSPITAL_COMMUNITY): Payer: Medicare Other

## 2016-09-17 LAB — CBC WITH DIFFERENTIAL/PLATELET
Basophils Absolute: 0 10*3/uL (ref 0.0–0.1)
Basophils Relative: 0 %
Eosinophils Absolute: 0 10*3/uL (ref 0.0–0.7)
Eosinophils Relative: 0 %
HCT: 41.6 % (ref 36.0–46.0)
HEMOGLOBIN: 13.2 g/dL (ref 12.0–15.0)
LYMPHS ABS: 1.8 10*3/uL (ref 0.7–4.0)
Lymphocytes Relative: 13 %
MCH: 26.3 pg (ref 26.0–34.0)
MCHC: 31.7 g/dL (ref 30.0–36.0)
MCV: 83 fL (ref 78.0–100.0)
MONO ABS: 0.9 10*3/uL (ref 0.1–1.0)
MONOS PCT: 6 %
Neutro Abs: 11 10*3/uL — ABNORMAL HIGH (ref 1.7–7.7)
Neutrophils Relative %: 81 %
Platelets: 230 10*3/uL (ref 150–400)
RBC: 5.01 MIL/uL (ref 3.87–5.11)
RDW: 18.4 % — ABNORMAL HIGH (ref 11.5–15.5)
WBC: 13.7 10*3/uL — ABNORMAL HIGH (ref 4.0–10.5)

## 2016-09-17 LAB — GLUCOSE, CAPILLARY
GLUCOSE-CAPILLARY: 89 mg/dL (ref 65–99)
GLUCOSE-CAPILLARY: 101 mg/dL — AB (ref 65–99)
Glucose-Capillary: 91 mg/dL (ref 65–99)
Glucose-Capillary: 101 mg/dL — ABNORMAL HIGH (ref 65–99)
Glucose-Capillary: 94 mg/dL (ref 65–99)

## 2016-09-17 LAB — BASIC METABOLIC PANEL
Anion gap: 7 (ref 5–15)
BUN: 19 mg/dL (ref 6–20)
CHLORIDE: 107 mmol/L (ref 101–111)
CO2: 29 mmol/L (ref 22–32)
CREATININE: 0.74 mg/dL (ref 0.44–1.00)
Calcium: 9.1 mg/dL (ref 8.9–10.3)
GFR calc Af Amer: >60 mL/min (ref >60)
GFR calc non Af Amer: >60 mL/min (ref >60)
Glucose, Bld: 107 mg/dL — ABNORMAL HIGH (ref 65–99)
Potassium: 4 mmol/L (ref 3.5–5.1)
SODIUM: 143 mmol/L (ref 135–145)

## 2016-09-17 LAB — LACTIC ACID, PLASMA: LACTIC ACID, VENOUS: 0.8 mmol/L (ref 0.5–1.9)

## 2016-09-17 MED ORDER — MORPHINE SULFATE (PF) 2 MG/ML IV SOLN
1.0000 mg | INTRAVENOUS | Status: DC | PRN
  Administered 2016-09-17 (×2): 3 mg via INTRAVENOUS
  Administered 2016-09-17 (×2): 2 mg via INTRAVENOUS
  Administered 2016-09-17: 3 mg via INTRAVENOUS
  Administered 2016-09-18 – 2016-09-21 (×11): 2 mg via INTRAVENOUS
  Filled 2016-09-17: qty 2
  Filled 2016-09-17 (×12): qty 1
  Filled 2016-09-17: qty 2
  Filled 2016-09-17: qty 1
  Filled 2016-09-17: qty 2
  Filled 2016-09-17: qty 1

## 2016-09-17 MED ORDER — SODIUM CHLORIDE 0.9 % IV SOLN
INTRAVENOUS | Status: DC
  Administered 2016-09-17 – 2016-09-18 (×3): via INTRAVENOUS

## 2016-09-17 MED ORDER — INSULIN ASPART 100 UNIT/ML ~~LOC~~ SOLN
0.0000 [IU] | SUBCUTANEOUS | Status: DC
  Administered 2016-09-19: 2 [IU] via SUBCUTANEOUS

## 2016-09-17 MED ORDER — INSULIN GLARGINE 100 UNIT/ML ~~LOC~~ SOLN
40.0000 [IU] | Freq: Two times a day (BID) | SUBCUTANEOUS | Status: DC
  Administered 2016-09-17 – 2016-09-18 (×2): 40 [IU] via SUBCUTANEOUS
  Filled 2016-09-17 (×3): qty 0.4

## 2016-09-17 NOTE — Progress Notes (Signed)
PROGRESS NOTE    [name]  WUJ:[id number] DOB: [birth year] DOA: 09/16/2016 PCP: Asencion Noble, MD   Brief Narrative: This is a 66 year old female with multiple medical problems and a long-standing giant ventral hernia presented to the emergency department was abdominal pain and nausea and vomiting. She was found to have SBO on CT, without any evidence of bowel ischemia. Surgery consulted, recommended conservative management in view of her extensive medical problems and also recurrent hernia.   Assessment & Plan:   Principal Problem:   Small bowel obstruction (HCC) Active Problems:   Obstructive sleep apnea   COPD (chronic obstructive pulmonary disease) with emphysema (HCC)   Recurrent ventral incisional hernia   Insulin-requiring or dependent type II diabetes mellitus ([place])   Rheumatoid arthritis ([place])   Small bowel obstruction:  Probably secondary to bowel adhesions.  Bowel rest, NG tube with intermittent suction.  Nausea and vomiting have improved. But abdominal pain has been persistent, without any signs of peritonitis.  Will increase her pain medication to every 2 hours and watch her closely.  IV fluids to keep her hydrated.  Surgery consutled and recommended conservative management.    COPD:  No wheezing heard. Resume home bronchodilators as needed.    Insulin dep DM:  CBG (last 3)   Recent Labs  09/17/16 0826 09/17/16 1222 09/17/16 1615  GLUCAP 91 101* 101*   cbg's every 4 hours. Changed mod SSI to sensitive SSI.  No oral meds as she is NPO. Decrease the lantus to 40 units BID FROM 60 UNITS BID.    Rheumatoid arthritis: stable. No change in meds.   OSA/ MORBID OBESITY : no change in meds . Outpatient follow up.    Ventral hernia:  No signs of incarceration of bowel.   Hypertension:  Well controlled.    Marked leukocytosis and elevated lactic acid:  Possibly from dehydration. No signs of infection or bowel ischemia. Leukocytosis has improved and trend  lactic acid level.  Was started on zosyn empirically on admission.    DVT prophylaxis: (scd's Code Status: (full code.  Family Communication: none at bedside. Discussed the plan of car with the patient.  Disposition Plan: pending resolution of SBO.  Consultants:   Surgery.    Procedures: none.    Antimicrobials: zosyn since admission   Subjective:  Reports persist ant abd pain.  Objective: Vitals:   09/17/16 0547 09/17/16 0700 09/17/16 0928 09/17/16 1416  BP: (!) 105/55   (!) 157/68  Pulse: 92   96  Resp: 20   18  Temp: 98.4 F (36.9 C)   98.2 F (36.8 C)  TempSrc: Oral   Oral  SpO2: 98%  90% 90%  Weight:  123.9 kg (273 lb 2.4 oz)    Height:        Intake/Output Summary (Last 24 hours) at 09/17/16 1644 Last data filed at 09/17/16 1416  Gross per 24 hour  Intake          3181.67 ml  Output             2850 ml  Net           331.67 ml   Filed Weights   09/16/16 1628 09/17/16 0700  Weight: 122.5 kg (270 lb) 123.9 kg (273 lb 2.4 oz)    Examination:  General exam: Appears in mild to mod distress from abd pain.  Respiratory system: Clear to auscultation. Respiratory effort normal. Cardiovascular system: S1 & S2 heard, RRR. No JVD, murmurs, rubs, gallops  or clicks. Pedal edema trace.  Gastrointestinal system: massive abdominal hernia, non reducible, mid line scar present, abdomen appers slightly distended, tender generalized. But no rebound tenderness. Bowel sounds hypoactive.   Central nervous system: Alert and oriented. No focal neurological deficits. Extremities:able to move all extremities.  Skin: No rashes, lesions or ulcers Psychiatry: in distress and anxious from pain.      Data Reviewed: I have personally reviewed following labs and imaging studies  CBC:  Recent Labs Lab 09/16/16 1716 09/17/16 0454  WBC 21.5* 13.7*  NEUTROABS  --  11.0*  HGB 15.6* 13.2  HCT 47.4* 41.6  MCV 81.2 83.0  PLT 291 809   Basic Metabolic Panel:  Recent  Labs Lab 09/16/16 1716 09/17/16 0454  NA 138 143  K 4.3 4.0  CL 100* 107  CO2 23 29  GLUCOSE 272* 107*  BUN 24* 19  CREATININE 0.93 0.74  CALCIUM 10.4* 9.1   GFR: Estimated Creatinine Clearance: 88.1 mL/min (by C-G formula based on SCr of 0.74 mg/dL). Liver Function Tests:  Recent Labs Lab 09/16/16 1716  AST 25  ALT 28  ALKPHOS 85  BILITOT 0.6  PROT 8.6*  ALBUMIN 4.4    Recent Labs Lab 09/16/16 1716  LIPASE 32   No results for input(s): AMMONIA in the last 168 hours. Coagulation Profile: No results for input(s): INR, PROTIME in the last 168 hours. Cardiac Enzymes: No results for input(s): CKTOTAL, CKMB, CKMBINDEX, TROPONINI in the last 168 hours. BNP (last 3 results) No results for input(s): PROBNP in the last 8760 hours. HbA1C: No results for input(s): HGBA1C in the last 72 hours. CBG:  Recent Labs Lab 09/16/16 2303 09/17/16 0541 09/17/16 0826 09/17/16 1222 09/17/16 1615  GLUCAP 177* 89 91 101* 101*   Lipid Profile: No results for input(s): CHOL, HDL, LDLCALC, TRIG, CHOLHDL, LDLDIRECT in the last 72 hours. Thyroid Function Tests: No results for input(s): TSH, T4TOTAL, FREET4, T3FREE, THYROIDAB in the last 72 hours. Anemia Panel: No results for input(s): VITAMINB12, FOLATE, FERRITIN, TIBC, IRON, RETICCTPCT in the last 72 hours. Sepsis Labs:  Recent Labs Lab 09/16/16 1740 09/16/16 2253  LATICACIDVEN 4.48* 2.1*    No results found for this or any previous visit (from the past 240 hour(s)).       Radiology Studies: Ct Abdomen Pelvis W Contrast  Addendum Date: 09/16/2016   ADDENDUM REPORT: 09/16/2016 21:58 ADDENDUM: History should read known abdominal wall hernia INSTEAD of abdominal aortic aneurysm. Electronically Signed   By: Margarette Canada M.D.   On: 09/16/2016 21:58   Result Date: 09/16/2016 CLINICAL DATA:  66 year old female with abdominal and pelvic pain and nausea and vomiting for 2 hours. Known abdominal aortic aneurysm EXAM: CT ABDOMEN  AND PELVIS WITH CONTRAST TECHNIQUE: Multidetector CT imaging of the abdomen and pelvis was performed using the standard protocol following bolus administration of intravenous contrast. CONTRAST:  159mL ISOVUE-300 IOPAMIDOL (ISOVUE-300) INJECTION 61% COMPARISON:  01/21/2009 FINDINGS: Lower chest: No acute abnormality. Hepatobiliary: The liver is unremarkable. The patient is status post cholecystectomy. There is no evidence of biliary dilatation. Pancreas: Unremarkable Spleen: Unremarkable Adrenals/Urinary Tract: The kidneys, adrenal glands and bladder are unremarkable. Stomach/Bowel: A very large anterior abdominal/ pelvic wall hernia containing the majority of the small bowel and colon again noted. Dilated stool filled proximal and mid small bowel loops are noted with collapsed distal small bowel loops compatible with small bowel obstruction. The small bowel transition point lies within the lower aspect of the hernia without obstructing cause identified and may  be related to an adhesion or internal hernia. There is no evidence of pneumoperitoneum Vascular/Lymphatic: Aortic atherosclerosis. No enlarged abdominal or pelvic lymph nodes. Reproductive: Status post hysterectomy. No adnexal masses. Other: No ascites or abscess. Musculoskeletal: No acute abnormality identified. Multilevel degenerative changes within the lumbar spine identified. IMPRESSION: High-grade small bowel obstruction occurring in the very large abdominal/ pelvic wall hernia. Transition point identified without identifiable obstructing cause - probably related to adhesion or internal hernia. No evidence of pneumoperitoneum or abscess. Aortic Atherosclerosis (ICD10-I70.0). Electronically Signed: By: Margarette Canada M.D. On: 09/16/2016 19:32   Dg Abd 2 Views  Result Date: 09/17/2016 CLINICAL DATA:  66 year old female with abdominal pain and distension. Admitted yesterday, NG tube placed. EXAM: ABDOMEN - 2 VIEW COMPARISON:  09/16/2016 abdominal radiograph  and CT Abdomen and Pelvis FINDINGS: Supine and left-side-down lateral decubitus views of the abdomen. NG tube side hole is at the level of the gastric body. Abundant right lateral abdominal bowel loops are noted and are probably within the very large ventral right abdominal hernia. Most of the bowel gas pattern appears nonobstructed although there are gas-filled small bowel loops up to 42 mm diameter. No acute osseous abnormality identified. Stable cholecystectomy clips. IMPRESSION: 1. NG tube side hole up the level of the gastric body. 2. Partial small bowel obstruction suspected, with abundant small and large bowel gas including within the large ventral abdominal hernia. Some mildly to moderately dilated small bowel loops are evident. 3. No free air. Electronically Signed   By: Genevie Ann M.D.   On: 09/17/2016 10:39   Dg Abd Portable 1v  Result Date: 09/16/2016 CLINICAL DATA:  NG tube placement EXAM: PORTABLE ABDOMEN - 1 VIEW COMPARISON:  CT abdomen pelvis 09/16/2016 FINDINGS: The side port of the nasogastric tube is near the gastroesophageal junction. Recommend advancing by 5-7 cm. IMPRESSION: NG tube side port at the GE junction. Recommend advancing by 5-7 cm. Electronically Signed   By: Ulyses Jarred M.D.   On: 09/16/2016 21:16        Scheduled Meds: . folic acid  1 mg Intravenous Daily  . insulin aspart  0-15 Units Subcutaneous Q4H  . insulin glargine  60 Units Subcutaneous BID  . mometasone-formoterol  2 puff Inhalation BID  . tiotropium  18 mcg Inhalation Daily   Continuous Infusions: . sodium chloride 100 mL/hr at 09/17/16 1230  . piperacillin-tazobactam (ZOSYN)  IV 3.375 g (09/17/16 1426)     LOS: 1 day    Time spent: 30 minutes.     Hosie Poisson, MD [facility] Pager [phone]   If 7PM-7AM, please contact night-coverage [email or web address] Password [email or web address] 09/17/2016, 4:44 PM

## 2016-09-17 NOTE — Progress Notes (Signed)
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  Check x-rays.   LOS: 1 day        Chief Complaint/Subjective: Pain on right side where hernia acutely got larger is no different.  No flatus or BM.  Objective: Vital signs in last 24 hours: Temp:  [98 F (36.7 C)-98.7 F (37.1 C)] 98.4 F (36.9 C) (06/09 0547) Pulse Rate:  [92-99] 92 (06/09 0547) Resp:  [16-20] 20 (06/09 0547) BP: (105-142)/(54-76) 105/55 (06/09 0547) SpO2:  [90 %-98 %] 90 % (06/09 0928) Weight:  [122.5 kg (270 lb)-123.9 kg (273 lb 2.4 oz)] 123.9 kg (273 lb 2.4 oz) (06/09 0700)    Intake/Output from previous day: 06/08 0701 - 06/09 0700 In: 2981.7 [I.V.:481.7; IV Piggyback:2500] Out: 1850 [Urine:1600; Emesis/NG output:250] Intake/Output this shift: No intake/output data recorded.  PE: General- In NAD.  Awake and alert. Abdomen-obese, giant ventral hernia listing to right with right lateral tenderness, normal active bowel sounds (not rushing or high-pitched)  Lab Results:   Recent Labs  09/16/16 1716 09/17/16 0454  WBC 21.5* 13.7*  HGB 15.6* 13.2  HCT 47.4* 41.6  PLT 291 230   BMET  Recent Labs  09/16/16 1716 09/17/16 0454  NA 138 143  K 4.3 4.0  CL 100* 107  CO2 23 29  GLUCOSE 272* 107*  BUN 24* 19  CREATININE 0.93 0.74  CALCIUM 10.4* 9.1   PT/INR No results for input(s): LABPROT, INR in the last 72 hours. Comprehensive Metabolic Panel:    Component Value Date/Time   NA 143 09/17/2016 0454   NA 138 09/16/2016 1716   K 4.0 09/17/2016 0454   K 4.3 09/16/2016 1716   CL 107 09/17/2016 0454   CL 100  (L) 09/16/2016 1716   CO2 29 09/17/2016 0454   CO2 23 09/16/2016 1716   BUN 19 09/17/2016 0454   BUN 24 (H) 09/16/2016 1716   CREATININE 0.74 09/17/2016 0454   CREATININE 0.93 09/16/2016 1716   CREATININE 0.84 05/10/2016 1347   GLUCOSE 107 (H) 09/17/2016 0454   GLUCOSE 272 (H) 09/16/2016 1716   CALCIUM 9.1 09/17/2016 0454   CALCIUM 10.4 (H) 09/16/2016 1716   AST 25 09/16/2016 1716   AST 16 12/30/2014 1551   ALT 28 09/16/2016 1716   ALT 22 12/30/2014 1551   ALKPHOS 85 09/16/2016 1716   ALKPHOS 78 12/30/2014 1551   BILITOT 0.6 09/16/2016 1716   BILITOT 0.5 12/30/2014 1551   PROT 8.6 (H) 09/16/2016 1716   PROT 7.6 12/30/2014 1551   ALBUMIN 4.4 09/16/2016 1716   ALBUMIN 3.9 12/30/2014 1551     Studies/Results: Ct Abdomen Pelvis W Contrast  Addendum Date: 09/16/2016   ADDENDUM REPORT: 09/16/2016 21:58 ADDENDUM: History should read known abdominal wall hernia INSTEAD of abdominal aortic aneurysm. Electronically Signed   By: Margarette Canada M.D.   On: 09/16/2016 21:58   Result Date: 09/16/2016 CLINICAL DATA:  66 year old female with abdominal and pelvic pain and nausea and vomiting for 2 hours. Known abdominal aortic aneurysm EXAM: CT ABDOMEN AND PELVIS WITH CONTRAST TECHNIQUE: Multidetector CT imaging of the abdomen and pelvis was performed using the standard protocol following bolus  administration of intravenous contrast. CONTRAST:  148mL ISOVUE-300 IOPAMIDOL (ISOVUE-300) INJECTION 61% COMPARISON:  01/21/2009 FINDINGS: Lower chest: No acute abnormality. Hepatobiliary: The liver is unremarkable. The patient is status post cholecystectomy. There is no evidence of biliary dilatation. Pancreas: Unremarkable Spleen: Unremarkable Adrenals/Urinary Tract: The kidneys, adrenal glands and bladder are unremarkable. Stomach/Bowel: A very large anterior abdominal/ pelvic wall hernia containing the majority of the small bowel and colon again noted. Dilated stool filled proximal and mid small bowel loops are  noted with collapsed distal small bowel loops compatible with small bowel obstruction. The small bowel transition point lies within the lower aspect of the hernia without obstructing cause identified and may be related to an adhesion or internal hernia. There is no evidence of pneumoperitoneum Vascular/Lymphatic: Aortic atherosclerosis. No enlarged abdominal or pelvic lymph nodes. Reproductive: Status post hysterectomy. No adnexal masses. Other: No ascites or abscess. Musculoskeletal: No acute abnormality identified. Multilevel degenerative changes within the lumbar spine identified. IMPRESSION: High-grade small bowel obstruction occurring in the very large abdominal/ pelvic wall hernia. Transition point identified without identifiable obstructing cause - probably related to adhesion or internal hernia. No evidence of pneumoperitoneum or abscess. Aortic Atherosclerosis (ICD10-I70.0). Electronically Signed: By: Margarette Canada M.D. On: 09/16/2016 19:32   Dg Abd Portable 1v  Result Date: 09/16/2016 CLINICAL DATA:  NG tube placement EXAM: PORTABLE ABDOMEN - 1 VIEW COMPARISON:  CT abdomen pelvis 09/16/2016 FINDINGS: The side port of the nasogastric tube is near the gastroesophageal junction. Recommend advancing by 5-7 cm. IMPRESSION: NG tube side port at the GE junction. Recommend advancing by 5-7 cm. Electronically Signed   By: Ulyses Jarred M.D.   On: 09/16/2016 21:16    Anti-infectives: Anti-infectives    Start     Dose/Rate Route Frequency Ordered Stop   09/16/16 2245  piperacillin-tazobactam (ZOSYN) IVPB 3.375 g     3.375 g 12.5 mL/hr over 240 Minutes Intravenous Every 8 hours 09/16/16 2239         Loa Idler J 09/17/2016

## 2016-09-17 NOTE — Progress Notes (Signed)
Pharmacy Antibiotic Note  [name] is a 66 y.o. female admitted on 09/16/2016 with Intra-abdominal infection.  Pharmacy has been consulted for zosyn dosing.  Plan: Zosyn 3.375g IV q8h (4 hour infusion).  Height: 5\' 2"  (157.5 cm) Weight: 270 lb (122.5 kg) IBW/kg (Calculated) : 50.1  Temp (24hrs), Avg:98.2 F (36.8 C), Min:98 F (36.7 C), Max:98.7 F (37.1 C)   Recent Labs Lab 09/16/16 1716 09/16/16 1740 09/16/16 2253  WBC 21.5*  --   --   CREATININE 0.93  --   --   LATICACIDVEN  --  4.48* 2.1*    Estimated Creatinine Clearance: 75.3 mL/min (by C-G formula based on SCr of 0.93 mg/dL).    Allergies  Allergen Reactions  . Demerol Nausea Only    Severe  . Hydromorphone Hcl Itching    All over the body.  . Tetracycline Hives    Antimicrobials this admission: Zosyn 09/16/2016 >>   Dose adjustments this admission: -  Microbiology results: pending  Thank you for allowing pharmacy to be a part of this patient's care.  Nani Skillern Crowford 09/17/2016 12:04 AM

## 2016-09-18 LAB — BASIC METABOLIC PANEL
Anion gap: 6 (ref 5–15)
BUN: 17 mg/dL (ref 6–20)
CALCIUM: 8.4 mg/dL — AB (ref 8.9–10.3)
CO2: 28 mmol/L (ref 22–32)
CREATININE: 0.72 mg/dL (ref 0.44–1.00)
Chloride: 107 mmol/L (ref 101–111)
GFR calc non Af Amer: >60 mL/min (ref >60)
GLUCOSE: 85 mg/dL (ref 65–99)
Potassium: 3.9 mmol/L (ref 3.5–5.1)
Sodium: 141 mmol/L (ref 135–145)

## 2016-09-18 LAB — GLUCOSE, CAPILLARY
GLUCOSE-CAPILLARY: 68 mg/dL (ref 65–99)
Glucose-Capillary: 58 mg/dL — ABNORMAL LOW (ref 65–99)
Glucose-Capillary: 66 mg/dL (ref 65–99)
Glucose-Capillary: 72 mg/dL (ref 65–99)
Glucose-Capillary: 74 mg/dL (ref 65–99)
Glucose-Capillary: 75 mg/dL (ref 65–99)
Glucose-Capillary: 94 mg/dL (ref 65–99)
Glucose-Capillary: 97 mg/dL (ref 65–99)

## 2016-09-18 LAB — CBC
HEMATOCRIT: 41.8 % (ref 36.0–46.0)
Hemoglobin: 12.8 g/dL (ref 12.0–15.0)
MCH: 25.8 pg — ABNORMAL LOW (ref 26.0–34.0)
MCHC: 30.6 g/dL (ref 30.0–36.0)
MCV: 84.3 fL (ref 78.0–100.0)
PLATELETS: 188 10*3/uL (ref 150–400)
RBC: 4.96 MIL/uL (ref 3.87–5.11)
RDW: 18.3 % — AB (ref 11.5–15.5)
WBC: 8.1 10*3/uL (ref 4.0–10.5)

## 2016-09-18 LAB — HEMOGLOBIN A1C
HEMOGLOBIN A1C: 7.8 % — AB (ref 4.8–5.6)
MEAN PLASMA GLUCOSE: 177 mg/dL

## 2016-09-18 MED ORDER — DEXTROSE 50 % IV SOLN
25.0000 mL | Freq: Once | INTRAVENOUS | Status: AC
  Administered 2016-09-18: 25 mL via INTRAVENOUS

## 2016-09-18 MED ORDER — DEXTROSE-NACL 5-0.9 % IV SOLN
INTRAVENOUS | Status: DC
  Administered 2016-09-18 – 2016-09-20 (×3): via INTRAVENOUS

## 2016-09-18 MED ORDER — INSULIN GLARGINE 100 UNIT/ML ~~LOC~~ SOLN
10.0000 [IU] | Freq: Two times a day (BID) | SUBCUTANEOUS | Status: DC
  Administered 2016-09-18 – 2016-09-20 (×4): 10 [IU] via SUBCUTANEOUS
  Filled 2016-09-18 (×5): qty 0.1

## 2016-09-18 MED ORDER — DEXTROSE 50 % IV SOLN
INTRAVENOUS | Status: AC
  Administered 2016-09-18: 25 mL via INTRAVENOUS
  Filled 2016-09-18: qty 50

## 2016-09-18 MED ORDER — INSULIN GLARGINE 100 UNIT/ML ~~LOC~~ SOLN
20.0000 [IU] | Freq: Two times a day (BID) | SUBCUTANEOUS | Status: DC
  Filled 2016-09-18: qty 0.2

## 2016-09-18 MED ORDER — DEXTROSE 50 % IV SOLN
INTRAVENOUS | Status: AC
  Administered 2016-09-18: 50 mL
  Filled 2016-09-18: qty 50

## 2016-09-18 NOTE — Progress Notes (Signed)
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 Leave ng tube to suction.  Check x-rays tomorrow.   LOS: 2 days        Chief Complaint/Subjective: Pain on right side where hernia acutely got larger is less today.  Had 2 small episodes of flatus.  Objective: Vital signs in last 24 hours: Temp:  [98.2 F (36.8 C)-98.6 F (37 C)] 98.6 F (37 C) (06/10 0434) Pulse Rate:  [88-96] 88 (06/10 0434) Resp:  [16-18] 18 (06/10 0434) BP: (109-160)/(38-68) 160/68 (06/10 0434) SpO2:  [90 %-94 %] 92 % (06/10 0934) Weight:  [123.9 kg (273 lb 2.4 oz)] 123.9 kg (273 lb 2.4 oz) (06/10 0440)    Intake/Output from previous day: 06/09 0701 - 06/10 0700 In: 1868.3 [P.O.:60; I.V.:1608.3; IV Piggyback:200] Out: 1448 [Urine:1575; Emesis/NG output:200] Intake/Output this shift: Total I/O In: -  Out: 100 [Emesis/NG output:100]  PE: General- In NAD.  Awake and alert. Abdomen-obese, giant ventral hernia listing to right with minimal right lateral tenderness today  Lab Results:   Recent Labs  09/17/16 0454 09/18/16 0447  WBC 13.7* 8.1  HGB 13.2 12.8  HCT 41.6 41.8  PLT 230 188   BMET  Recent Labs  09/17/16 0454 09/18/16 0447  NA 143 141  K 4.0 3.9  CL 107 107  CO2 29 28  GLUCOSE 107* 85  BUN 19 17  CREATININE 0.74 0.72  CALCIUM 9.1 8.4*   PT/INR No results for input(s): LABPROT, INR in the last 72 hours. Comprehensive Metabolic Panel:    Component Value Date/Time   NA 141 09/18/2016 0447   NA 143 09/17/2016 0454   K 3.9  09/18/2016 0447   K 4.0 09/17/2016 0454   CL 107 09/18/2016 0447   CL 107 09/17/2016 0454   CO2 28 09/18/2016 0447   CO2 29 09/17/2016 0454   BUN 17 09/18/2016 0447   BUN 19 09/17/2016 0454   CREATININE 0.72 09/18/2016 0447   CREATININE 0.74 09/17/2016 0454   CREATININE 0.84 05/10/2016 1347   GLUCOSE 85 09/18/2016 0447   GLUCOSE 107 (H) 09/17/2016 0454   CALCIUM 8.4 (L) 09/18/2016 0447   CALCIUM 9.1 09/17/2016 0454   AST 25 09/16/2016 1716   AST 16 12/30/2014 1551   ALT 28 09/16/2016 1716   ALT 22 12/30/2014 1551   ALKPHOS 85 09/16/2016 1716   ALKPHOS 78 12/30/2014 1551   BILITOT 0.6 09/16/2016 1716   BILITOT 0.5 12/30/2014 1551   PROT 8.6 (H) 09/16/2016 1716   PROT 7.6 12/30/2014 1551   ALBUMIN 4.4 09/16/2016 1716   ALBUMIN 3.9 12/30/2014 1551     Studies/Results: Ct Abdomen Pelvis W Contrast  Addendum Date: 09/16/2016   ADDENDUM REPORT: 09/16/2016 21:58 ADDENDUM: History should read known abdominal wall hernia INSTEAD of abdominal aortic aneurysm. Electronically Signed   By: Margarette Canada M.D.   On: 09/16/2016 21:58   Result Date: 09/16/2016 CLINICAL DATA:  66 year old female with abdominal and pelvic pain and nausea and vomiting for 2 hours. Known abdominal aortic aneurysm EXAM: CT ABDOMEN AND PELVIS WITH CONTRAST TECHNIQUE: Multidetector CT imaging of the  abdomen and pelvis was performed using the standard protocol following bolus administration of intravenous contrast. CONTRAST:  155mL ISOVUE-300 IOPAMIDOL (ISOVUE-300) INJECTION 61% COMPARISON:  01/21/2009 FINDINGS: Lower chest: No acute abnormality. Hepatobiliary: The liver is unremarkable. The patient is status post cholecystectomy. There is no evidence of biliary dilatation. Pancreas: Unremarkable Spleen: Unremarkable Adrenals/Urinary Tract: The kidneys, adrenal glands and bladder are unremarkable. Stomach/Bowel: A very large anterior abdominal/ pelvic wall hernia containing the majority of the small bowel and colon again  noted. Dilated stool filled proximal and mid small bowel loops are noted with collapsed distal small bowel loops compatible with small bowel obstruction. The small bowel transition point lies within the lower aspect of the hernia without obstructing cause identified and may be related to an adhesion or internal hernia. There is no evidence of pneumoperitoneum Vascular/Lymphatic: Aortic atherosclerosis. No enlarged abdominal or pelvic lymph nodes. Reproductive: Status post hysterectomy. No adnexal masses. Other: No ascites or abscess. Musculoskeletal: No acute abnormality identified. Multilevel degenerative changes within the lumbar spine identified. IMPRESSION: High-grade small bowel obstruction occurring in the very large abdominal/ pelvic wall hernia. Transition point identified without identifiable obstructing cause - probably related to adhesion or internal hernia. No evidence of pneumoperitoneum or abscess. Aortic Atherosclerosis (ICD10-I70.0). Electronically Signed: By: Margarette Canada M.D. On: 09/16/2016 19:32   Dg Abd 2 Views  Result Date: 09/17/2016 CLINICAL DATA:  66 year old female with abdominal pain and distension. Admitted yesterday, NG tube placed. EXAM: ABDOMEN - 2 VIEW COMPARISON:  09/16/2016 abdominal radiograph and CT Abdomen and Pelvis FINDINGS: Supine and left-side-down lateral decubitus views of the abdomen. NG tube side hole is at the level of the gastric body. Abundant right lateral abdominal bowel loops are noted and are probably within the very large ventral right abdominal hernia. Most of the bowel gas pattern appears nonobstructed although there are gas-filled small bowel loops up to 42 mm diameter. No acute osseous abnormality identified. Stable cholecystectomy clips. IMPRESSION: 1. NG tube side hole up the level of the gastric body. 2. Partial small bowel obstruction suspected, with abundant small and large bowel gas including within the large ventral abdominal hernia. Some mildly to  moderately dilated small bowel loops are evident. 3. No free air. Electronically Signed   By: Genevie Ann M.D.   On: 09/17/2016 10:39   Dg Abd Portable 1v  Result Date: 09/16/2016 CLINICAL DATA:  NG tube placement EXAM: PORTABLE ABDOMEN - 1 VIEW COMPARISON:  CT abdomen pelvis 09/16/2016 FINDINGS: The side port of the nasogastric tube is near the gastroesophageal junction. Recommend advancing by 5-7 cm. IMPRESSION: NG tube side port at the GE junction. Recommend advancing by 5-7 cm. Electronically Signed   By: Ulyses Jarred M.D.   On: 09/16/2016 21:16    Anti-infectives: Anti-infectives    Start     Dose/Rate Route Frequency Ordered Stop   09/16/16 2245  piperacillin-tazobactam (ZOSYN) IVPB 3.375 g     3.375 g 12.5 mL/hr over 240 Minutes Intravenous Every 8 hours 09/16/16 2239         [name] J 09/18/2016

## 2016-09-18 NOTE — Progress Notes (Signed)
Hypoglycemic Event  CBG: 58   Treatment: 25 ml of 50% Dextrose  Symptoms: Shakiness  Follow-up CBG: Time 1715 CBG Result:94  Possible Reasons for Event: NPO  Comments/MD notified:MD aware. Orders placed    Jassmin Kemmerer

## 2016-09-18 NOTE — Progress Notes (Signed)
Pt still has NG tube in place and wished to hold CPAP at this time.  Machine is ready at bedside for when Pt is able to utilize.  RT to monitor and assess as needed.

## 2016-09-18 NOTE — Progress Notes (Signed)
PROGRESS NOTE    [name]  SEG:[id number] DOB: 02-Dec-1950 DOA: 09/16/2016 PCP: Asencion Noble, MD   Brief Narrative: This is a 66 year old female with multiple medical problems and a long-standing giant ventral hernia presented to the emergency department was abdominal pain and nausea and vomiting. She was found to have SBO on CT, without any evidence of bowel ischemia. Surgery consulted, recommended conservative management in view of her extensive medical problems and also recurrent hernia.  6/10 no new complaints. Sleepy   Assessment & Plan:   Principal Problem:   Small bowel obstruction (HCC) Active Problems:   Obstructive sleep apnea   COPD (chronic obstructive pulmonary disease) with emphysema (HCC)   Recurrent ventral incisional hernia   Insulin-requiring or dependent type II diabetes mellitus ([place])   Rheumatoid arthritis ([place])   Small bowel obstruction:  Probably secondary to bowel adhesions.  Bowel rest, NG tube with intermittent suction.  Nausea and vomiting have improved. abd pain has improved, but still requesting medications. No signs of peritonitis.  IV fluids and IV pain control.  Surgery consutled and recommended conservative management.  Repeat abdominal films tomorrow to see if SBO improved.    COPD:  No wheezing heard. Resume home bronchodilators as needed.    Insulin dep DM:  CBG (last 3)   Recent Labs  09/18/16 0728 09/18/16 1153 09/18/16 1650  GLUCAP 72 74 58*   cbg's every 4 hours. Changed mod SSI to sensitive SSI.  No oral meds as she is NPO. Decrease the lantus to 20 units BID FROM 40 UNITS BID as she is hypoglycemic.  Started her dextrose , normal saline fluids.    Rheumatoid arthritis: stable. No change in meds.   OSA/ MORBID OBESITY : no change in meds . Outpatient follow up.    Ventral hernia:  No signs of incarceration of bowel.   Hypertension:  Well controlled.    Marked leukocytosis and elevated lactic acid:  Possibly from  dehydration. No signs of infection or bowel ischemia. Leukocytosis has resolved and repeat lactic acid levels Is normal. Was started on zosyn empirically on admission. Would continue with IV zosyn for now.    DVT prophylaxis: (scd's Code Status: (full code.  Family Communication: none at bedside. Discussed the plan of car with the patient.  Disposition Plan: pending resolution of SBO.  Consultants:   Surgery.    Procedures: none.    Antimicrobials: zosyn since admission   Subjective: Pain controlled with meds. No nausea, or vomiting.   Objective: Vitals:   09/18/16 0440 09/18/16 0933 09/18/16 0934 09/18/16 1343  BP:    (!) 119/58  Pulse:    73  Resp:    18  Temp:    97.8 F (36.6 C)  TempSrc:    Axillary  SpO2:  92% 92% 95%  Weight: 123.9 kg (273 lb 2.4 oz)     Height:        Intake/Output Summary (Last 24 hours) at 09/18/16 1703 Last data filed at 09/18/16 1400  Gross per 24 hour  Intake             2610 ml  Output             1275 ml  Net             1335 ml   Filed Weights   09/16/16 1628 09/17/16 0700 09/18/16 0440  Weight: 122.5 kg (270 lb) 123.9 kg (273 lb 2.4 oz) 123.9 kg (273 lb 2.4 oz)  Examination:  General exam: comfortably sleeping.  Respiratory system: diminished at bases.  Cardiovascular system: s1s2, RRR, pedal edema present.  Gastrointestinal system: massive abdominal hernia, non reducible, mid line scar present, abdomen appers distended, tender mild  generalized. But no rebound tenderness. Bowel sounds hypoactive.   Central nervous system: slightly lethargic from pain meds.   Extremities:able to move all extremities.  Skin: No rashes, lesions or ulcers Psychiatry: normal mood.     Data Reviewed: I have personally reviewed following labs and imaging studies  CBC:  Recent Labs Lab 09/16/16 1716 09/17/16 0454 09/18/16 0447  WBC 21.5* 13.7* 8.1  NEUTROABS  --  11.0*  --   HGB 15.6* 13.2 12.8  HCT 47.4* 41.6 41.8  MCV 81.2 83.0  84.3  PLT 291 230 950   Basic Metabolic Panel:  Recent Labs Lab 09/16/16 1716 09/17/16 0454 09/18/16 0447  NA 138 143 141  K 4.3 4.0 3.9  CL 100* 107 107  CO2 23 29 28   GLUCOSE 272* 107* 85  BUN 24* 19 17  CREATININE 0.93 0.74 0.72  CALCIUM 10.4* 9.1 8.4*   GFR: Estimated Creatinine Clearance: 88.1 mL/min (by C-G formula based on SCr of 0.72 mg/dL). Liver Function Tests:  Recent Labs Lab 09/16/16 1716  AST 25  ALT 28  ALKPHOS 85  BILITOT 0.6  PROT 8.6*  ALBUMIN 4.4    Recent Labs Lab 09/16/16 1716  LIPASE 32   No results for input(s): AMMONIA in the last 168 hours. Coagulation Profile: No results for input(s): INR, PROTIME in the last 168 hours. Cardiac Enzymes: No results for input(s): CKTOTAL, CKMB, CKMBINDEX, TROPONINI in the last 168 hours. BNP (last 3 results) No results for input(s): PROBNP in the last 8760 hours. HbA1C:  Recent Labs  09/16/16 2245  HGBA1C 7.8*   CBG:  Recent Labs Lab 09/17/16 1615 09/17/16 2142 09/18/16 0728 09/18/16 1153 09/18/16 1650  GLUCAP 101* 94 72 74 58*   Lipid Profile: No results for input(s): CHOL, HDL, LDLCALC, TRIG, CHOLHDL, LDLDIRECT in the last 72 hours. Thyroid Function Tests: No results for input(s): TSH, T4TOTAL, FREET4, T3FREE, THYROIDAB in the last 72 hours. Anemia Panel: No results for input(s): VITAMINB12, FOLATE, FERRITIN, TIBC, IRON, RETICCTPCT in the last 72 hours. Sepsis Labs:  Recent Labs Lab 09/16/16 1740 09/16/16 2253 09/17/16 1742  LATICACIDVEN 4.48* 2.1* 0.8    Recent Results (from the past 240 hour(s))  Culture, blood (routine x 2)     Status: None (Preliminary result)   Collection Time: 09/16/16 10:45 PM  Result Value Ref Range Status   Specimen Description BLOOD LEFT ARM  Final   Special Requests   Final    BOTTLES DRAWN AEROBIC AND ANAEROBIC Blood Culture adequate volume   Culture   Final    NO GROWTH 1 DAY Performed at [facility], [place] [address]  [place], [place] [address]    Report Status PENDING  Incomplete  Culture, blood (routine x 2)     Status: None (Preliminary result)   Collection Time: 09/16/16 10:54 PM  Result Value Ref Range Status   Specimen Description BLOOD RIGHT ARM  Final   Special Requests   Final    BOTTLES DRAWN AEROBIC AND ANAEROBIC Blood Culture adequate volume   Culture   Final    NO GROWTH 1 DAY Performed at [facility], [place] [address] [place], [place] [address]    Report Status PENDING  Incomplete  Radiology Studies: Ct Abdomen Pelvis W Contrast  Addendum Date: 09/16/2016   ADDENDUM REPORT: 09/16/2016 21:58 ADDENDUM: History should read known abdominal wall hernia INSTEAD of abdominal aortic aneurysm. Electronically Signed   By: Margarette Canada M.D.   On: 09/16/2016 21:58   Result Date: 09/16/2016 CLINICAL DATA:  66 year old female with abdominal and pelvic pain and nausea and vomiting for 2 hours. Known abdominal aortic aneurysm EXAM: CT ABDOMEN AND PELVIS WITH CONTRAST TECHNIQUE: Multidetector CT imaging of the abdomen and pelvis was performed using the standard protocol following bolus administration of intravenous contrast. CONTRAST:  114mL ISOVUE-300 IOPAMIDOL (ISOVUE-300) INJECTION 61% COMPARISON:  01/21/2009 FINDINGS: Lower chest: No acute abnormality. Hepatobiliary: The liver is unremarkable. The patient is status post cholecystectomy. There is no evidence of biliary dilatation. Pancreas: Unremarkable Spleen: Unremarkable Adrenals/Urinary Tract: The kidneys, adrenal glands and bladder are unremarkable. Stomach/Bowel: A very large anterior abdominal/ pelvic wall hernia containing the majority of the small bowel and colon again noted. Dilated stool filled proximal and mid small bowel loops are noted with collapsed distal small bowel loops compatible with small bowel obstruction. The small bowel transition point lies within the lower aspect of the hernia without obstructing cause identified and may be  related to an adhesion or internal hernia. There is no evidence of pneumoperitoneum Vascular/Lymphatic: Aortic atherosclerosis. No enlarged abdominal or pelvic lymph nodes. Reproductive: Status post hysterectomy. No adnexal masses. Other: No ascites or abscess. Musculoskeletal: No acute abnormality identified. Multilevel degenerative changes within the lumbar spine identified. IMPRESSION: High-grade small bowel obstruction occurring in the very large abdominal/ pelvic wall hernia. Transition point identified without identifiable obstructing cause - probably related to adhesion or internal hernia. No evidence of pneumoperitoneum or abscess. Aortic Atherosclerosis (ICD10-I70.0). Electronically Signed: By: Margarette Canada M.D. On: 09/16/2016 19:32   Dg Abd 2 Views  Result Date: 09/17/2016 CLINICAL DATA:  66 year old female with abdominal pain and distension. Admitted yesterday, NG tube placed. EXAM: ABDOMEN - 2 VIEW COMPARISON:  09/16/2016 abdominal radiograph and CT Abdomen and Pelvis FINDINGS: Supine and left-side-down lateral decubitus views of the abdomen. NG tube side hole is at the level of the gastric body. Abundant right lateral abdominal bowel loops are noted and are probably within the very large ventral right abdominal hernia. Most of the bowel gas pattern appears nonobstructed although there are gas-filled small bowel loops up to 42 mm diameter. No acute osseous abnormality identified. Stable cholecystectomy clips. IMPRESSION: 1. NG tube side hole up the level of the gastric body. 2. Partial small bowel obstruction suspected, with abundant small and large bowel gas including within the large ventral abdominal hernia. Some mildly to moderately dilated small bowel loops are evident. 3. No free air. Electronically Signed   By: Genevie Ann M.D.   On: 09/17/2016 10:39   Dg Abd Portable 1v  Result Date: 09/16/2016 CLINICAL DATA:  NG tube placement EXAM: PORTABLE ABDOMEN - 1 VIEW COMPARISON:  CT abdomen pelvis  09/16/2016 FINDINGS: The side port of the nasogastric tube is near the gastroesophageal junction. Recommend advancing by 5-7 cm. IMPRESSION: NG tube side port at the GE junction. Recommend advancing by 5-7 cm. Electronically Signed   By: Ulyses Jarred M.D.   On: 09/16/2016 21:16        Scheduled Meds: . folic acid  1 mg Intravenous Daily  . insulin aspart  0-9 Units Subcutaneous Q4H  . insulin glargine  40 Units Subcutaneous BID  . mometasone-formoterol  2 puff Inhalation BID  . tiotropium  18 mcg  Inhalation Daily   Continuous Infusions: . sodium chloride 100 mL/hr at 09/18/16 0439  . piperacillin-tazobactam (ZOSYN)  IV 3.375 g (09/18/16 1340)     LOS: 2 days    Time spent: 30 minutes.     Hosie Poisson, MD [facility] Pager [phone]   If 7PM-7AM, please contact night-coverage [email or web address] Password [email or web address] 09/18/2016, 5:03 PM

## 2016-09-19 ENCOUNTER — Inpatient Hospital Stay (HOSPITAL_COMMUNITY): Payer: Medicare Other

## 2016-09-19 LAB — GLUCOSE, CAPILLARY
GLUCOSE-CAPILLARY: 60 mg/dL — AB (ref 65–99)
GLUCOSE-CAPILLARY: 84 mg/dL (ref 65–99)
GLUCOSE-CAPILLARY: 94 mg/dL (ref 65–99)
GLUCOSE-CAPILLARY: 175 mg/dL — AB (ref 65–99)
Glucose-Capillary: 102 mg/dL — ABNORMAL HIGH (ref 65–99)
Glucose-Capillary: 86 mg/dL (ref 65–99)
Glucose-Capillary: 93 mg/dL (ref 65–99)
Glucose-Capillary: 115 mg/dL — ABNORMAL HIGH (ref 65–99)

## 2016-09-19 MED ORDER — ZOLPIDEM TARTRATE 5 MG PO TABS
5.0000 mg | ORAL_TABLET | Freq: Every evening | ORAL | Status: DC | PRN
  Administered 2016-09-19 – 2016-09-20 (×2): 5 mg via ORAL
  Filled 2016-09-19 (×2): qty 1

## 2016-09-19 NOTE — Progress Notes (Signed)
Pt states that she will self administer CPAP when ready for bed.  RT to monitor and assess as needed.  

## 2016-09-19 NOTE — Progress Notes (Signed)
PROGRESS NOTE    [name]  JOA:[id number] DOB: 1950/09/20 DOA: 09/16/2016 PCP: Asencion Noble, MD   Brief Narrative: This is a 66 year old female with multiple medical problems and a long-standing giant ventral hernia presented to the emergency department was abdominal pain and nausea and vomiting. She was found to have SBO on CT, without any evidence of bowel ischemia. Surgery consulted, recommended conservative management in view of her extensive medical problems and also recurrent hernia.  6/10 no new complaints. Sleepy  6/11 alert and awake, reports having a BM last night and wants the NG tube out.   Assessment & Plan:   Principal Problem:   Small bowel obstruction (HCC) Active Problems:   Obstructive sleep apnea   COPD (chronic obstructive pulmonary disease) with emphysema (HCC)   Recurrent ventral incisional hernia   Insulin-requiring or dependent type II diabetes mellitus ([place])   Rheumatoid arthritis ([place])   Small bowel obstruction:  Probably secondary to bowel adhesions.  Bowel rest, NG tube with intermittent suction.  Nausea and vomiting have improved. abd pain has improved, but still requesting medications. No signs of peritonitis.  Ng tube clamped today and start her onclears and if able to tolerate clears without any issues, can d/c NG tube and put her on clear liquid diet.  would continue with IV fluids and IV pain control.  Surgery consutled and recommended conservative management.  Repeat abdominal films show persistent partial sbo.    COPD:  No wheezing heard. Resume home bronchodilators as needed.    Insulin dep DM:  CBG (last 3)   Recent Labs  09/19/16 0736 09/19/16 1003 09/19/16 1250  GLUCAP 86 84 94   cbg's every 4 hours. Con sensistive scale SSI.   DecreaseD the lantus to 20 units BID FROM 40 UNITS BID ON 6/10. Started her dextrose , normal saline fluids. She is not hypoglycemic any more.    Rheumatoid arthritis: stable. No change in meds.    OSA/ MORBID OBESITY : no change in meds . Outpatient follow up.    Ventral hernia:  No signs of incarceration of bowel.   Hypertension:  Well controlled.    Marked leukocytosis and elevated lactic acid:  Possibly from dehydration. No signs of infection or bowel ischemia. Leukocytosis has resolved and repeat lactic acid levels Is normal. Was started on zosyn empirically on admission. Would continue with IV zosyn for now. Day 4 of IV zosyn. Stop after 5 day sof IV zosyn.    DVT prophylaxis: (scd's Code Status: (full code.  Family Communication: none at bedside. Discussed the plan of care with the patient.  Disposition Plan: pending resolution of SBO.   Consultants:   Surgery.    Procedures: none.    Antimicrobials: zosyn since admission   Subjective:  No Nausea, vomiting and abd pain.  Vitals:   09/18/16 1959 09/18/16 2103 09/19/16 0445 09/19/16 1426  BP:  113/64 126/72 134/65  Pulse:  71 76 76  Resp:  18 18 18   Temp:  98.1 F (36.7 C) 97.9 F (36.6 C) 98.2 F (36.8 C)  TempSrc:  Oral Oral Oral  SpO2: 93% 97% 96% 98%  Weight:   123.7 kg (272 lb 11.2 oz)   Height:        Intake/Output Summary (Last 24 hours) at 09/19/16 1525 Last data filed at 09/19/16 0927  Gross per 24 hour  Intake              452 ml  Output  1400 ml  Net             -948 ml   Filed Weights   09/17/16 0700 09/18/16 0440 09/19/16 0445  Weight: 123.9 kg (273 lb 2.4 oz) 123.9 kg (273 lb 2.4 oz) 123.7 kg (272 lb 11.2 oz)    Examination:  General exam: awake and walking.   Respiratory system:a ir entry fair, no wheezing.  Cardiovascular system: s1s2, RRR  Trace edema present.  Gastrointestinal system: massive abdominal hernia, non reducible, mid line scar present, abdomen appears not as distended as yesterday, tenderness improved. But no rebound tenderness. Bowel sounds hypoactive.   Central nervous system: alert and oriented.  Extremities:trace pedal edema.  Skin: No  rashes, lesions or ulcers Psychiatry: normal mood.     Data Reviewed: I have personally reviewed following labs and imaging studies  CBC:  Recent Labs Lab 09/16/16 1716 09/17/16 0454 09/18/16 0447  WBC 21.5* 13.7* 8.1  NEUTROABS  --  11.0*  --   HGB 15.6* 13.2 12.8  HCT 47.4* 41.6 41.8  MCV 81.2 83.0 84.3  PLT 291 230 876   Basic Metabolic Panel:  Recent Labs Lab 09/16/16 1716 09/17/16 0454 09/18/16 0447  NA 138 143 141  K 4.3 4.0 3.9  CL 100* 107 107  CO2 23 29 28   GLUCOSE 272* 107* 85  BUN 24* 19 17  CREATININE 0.93 0.74 0.72  CALCIUM 10.4* 9.1 8.4*   GFR: Estimated Creatinine Clearance: 88 mL/min (by C-G formula based on SCr of 0.72 mg/dL). Liver Function Tests:  Recent Labs Lab 09/16/16 1716  AST 25  ALT 28  ALKPHOS 85  BILITOT 0.6  PROT 8.6*  ALBUMIN 4.4    Recent Labs Lab 09/16/16 1716  LIPASE 32   No results for input(s): AMMONIA in the last 168 hours. Coagulation Profile: No results for input(s): INR, PROTIME in the last 168 hours. Cardiac Enzymes: No results for input(s): CKTOTAL, CKMB, CKMBINDEX, TROPONINI in the last 168 hours. BNP (last 3 results) No results for input(s): PROBNP in the last 8760 hours. HbA1C:  Recent Labs  09/16/16 2245  HGBA1C 7.8*   CBG:  Recent Labs Lab 09/19/16 0337 09/19/16 0407 09/19/16 0736 09/19/16 1003 09/19/16 1250  GLUCAP 60* 102* 86 84 94   Lipid Profile: No results for input(s): CHOL, HDL, LDLCALC, TRIG, CHOLHDL, LDLDIRECT in the last 72 hours. Thyroid Function Tests: No results for input(s): TSH, T4TOTAL, FREET4, T3FREE, THYROIDAB in the last 72 hours. Anemia Panel: No results for input(s): VITAMINB12, FOLATE, FERRITIN, TIBC, IRON, RETICCTPCT in the last 72 hours. Sepsis Labs:  Recent Labs Lab 09/16/16 1740 09/16/16 2253 09/17/16 1742  LATICACIDVEN 4.48* 2.1* 0.8    Recent Results (from the past 240 hour(s))  Culture, blood (routine x 2)     Status: None (Preliminary result)     Collection Time: 09/16/16 10:45 PM  Result Value Ref Range Status   Specimen Description BLOOD LEFT ARM  Final   Special Requests   Final    BOTTLES DRAWN AEROBIC AND ANAEROBIC Blood Culture adequate volume   Culture   Final    NO GROWTH 1 DAY Performed at [facility], [place] [address], [place], [place] [address]    Report Status PENDING  Incomplete  Culture, blood (routine x 2)     Status: None (Preliminary result)   Collection Time: 09/16/16 10:54 PM  Result Value Ref Range Status   Specimen Description BLOOD RIGHT ARM  Final   Special Requests  Final    BOTTLES DRAWN AEROBIC AND ANAEROBIC Blood Culture adequate volume   Culture   Final    NO GROWTH 1 DAY Performed at [facility], [place] [address], [place], [place] [address]    Report Status PENDING  Incomplete         Radiology Studies: Dg Abd 2 Views  Result Date: 09/19/2016 CLINICAL DATA:  Followup small bowel obstruction. EXAM: ABDOMEN - 2 VIEW COMPARISON:  09/17/2016 FINDINGS: Nasogastric tube is within the stomach. Large hernia projecting towards the right as seen previously. There are persistent dilated loops within the hernia consistent with ongoing partial small bowel obstruction. The overall pattern is not worsened. No sign of free air. IMPRESSION: Large hernia projecting towards the right. Persistent dilated gas-filled loops in the hernia consistent with ongoing partial small bowel obstruction. Electronically Signed   By: Nelson Chimes M.D.   On: 09/19/2016 09:26        Scheduled Meds: . folic acid  1 mg Intravenous Daily  . insulin aspart  0-9 Units Subcutaneous Q4H  . insulin glargine  10 Units Subcutaneous BID  . mometasone-formoterol  2 puff Inhalation BID  . tiotropium  18 mcg Inhalation Daily   Continuous Infusions: . sodium chloride Stopped (09/18/16 1700)  . dextrose 5 % and 0.9% NaCl 50 mL/hr at 09/19/16 1427  . piperacillin-tazobactam (ZOSYN)  IV 3.375 g (09/19/16 1427)     LOS: 3  days    Time spent: 30 minutes.     Hosie Poisson, MD [facility] Pager [phone]   If 7PM-7AM, please contact night-coverage [email or web address] Password [facility] 09/19/2016, 3:25 PM

## 2016-09-19 NOTE — Progress Notes (Signed)
    CC:  Nausea and vomiting  Subjective: She feels better still sore on the right side of her abdomen.  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 Dr. Johney Maine has her healed up.  She had a large BM this AM, She is anxious to get rid of the NG.    Objective: Vital signs in last 24 hours: Temp:  [97.8 F (36.6 C)-98.1 F (36.7 C)] 97.9 F (36.6 C) (06/11 0445) Pulse Rate:  [71-76] 76 (06/11 0445) Resp:  [18] 18 (06/11 0445) BP: (113-126)/(58-72) 126/72 (06/11 0445) SpO2:  [93 %-97 %] 96 % (06/11 0445) Weight:  [123.7 kg (272 lb 11.2 oz)] 123.7 kg (272 lb 11.2 oz) (06/11 0445) Last BM Date: 09/16/16 60 PO 1300 IV Urine 1000 NG 700 BM x 1 today Afebrile, VSS No labs K+ 3.9 yesterday 2 view film today:Large hernia projecting towards the right. Persistent dilated gas-filled loops in the hernia consistent with ongoing partial small bowel obstruction.   Intake/Output from previous day: 06/10 0701 - 06/11 0700 In: 1393.7 [P.O.:60; I.V.:1283.7; IV Piggyback:50] Out: 1700 [Urine:1000; Emesis/NG output:700] Intake/Output this shift: Total I/O In: -  Out: 200 [Urine:200]  General appearance: alert, cooperative, no distress and NG uncomfortable Resp: clear to auscultation bilaterally GI: very large abdomen, lots of scars, not really distended that I can tell + BS and pt reports large normal BM this AM  Lab Results:   Recent Labs  09/17/16 0454 09/18/16 0447  WBC 13.7* 8.1  HGB 13.2 12.8  HCT 41.6 41.8  PLT 230 188    BMET  Recent Labs  09/17/16 0454 09/18/16 0447  NA 143 141  K 4.0 3.9  CL 107 107  CO2 29 28  GLUCOSE 107* 85  BUN 19 17  CREATININE 0.74 0.72  CALCIUM 9.1 8.4*   PT/INR No results for input(s): LABPROT, INR in the last 72 hours.   Recent Labs Lab 09/16/16 1716  AST 25  ALT 28  ALKPHOS 85  BILITOT 0.6  PROT 8.6*  ALBUMIN 4.4     Lipase     Component Value Date/Time   LIPASE 32 09/16/2016  1716     Medications: . folic acid  1 mg Intravenous Daily  . insulin aspart  0-9 Units Subcutaneous Q4H  . insulin glargine  10 Units Subcutaneous BID  . mometasone-formoterol  2 puff Inhalation BID  . tiotropium  18 mcg Inhalation Daily   . sodium chloride Stopped (09/18/16 1700)  . dextrose 5 % and 0.9% NaCl 50 mL/hr at 09/18/16 1856  . piperacillin-tazobactam (ZOSYN)  IV Stopped (09/19/16 0916)   Anti-infectives    Start     Dose/Rate Route Frequency Ordered Stop   09/16/16 2245  piperacillin-tazobactam (ZOSYN) IVPB 3.375 g     3.375 g 12.5 mL/hr over 240 Minutes Intravenous Every 8 hours 09/16/16 2239        Assessment/Plan SBO with recurrent large ventral Hernia/loss of Domain  OSA COPD Type II diabetes RA Body mass index is 49.8 FEN:  IV fluids ID: Zosyn 09/16/16 =>> day 4 DVT:  SCD's only - OK to be on anticoagulant from our standpoint  Plan:  Clamp NG, and if ok pull later today.  Start her on clears.         LOS: 3 days    Yulissa Needham 09/19/2016 [phone]

## 2016-09-20 LAB — CBC
HCT: 40.9 % (ref 36.0–46.0)
Hemoglobin: 12.9 g/dL (ref 12.0–15.0)
MCH: 25.9 pg — ABNORMAL LOW (ref 26.0–34.0)
MCHC: 31.5 g/dL (ref 30.0–36.0)
MCV: 82.1 fL (ref 78.0–100.0)
PLATELETS: 178 10*3/uL (ref 150–400)
RBC: 4.98 MIL/uL (ref 3.87–5.11)
RDW: 17.9 % — AB (ref 11.5–15.5)
WBC: 5.9 10*3/uL (ref 4.0–10.5)

## 2016-09-20 LAB — BASIC METABOLIC PANEL
Anion gap: 9 (ref 5–15)
BUN: 11 mg/dL (ref 6–20)
CALCIUM: 8.5 mg/dL — AB (ref 8.9–10.3)
CO2: 23 mmol/L (ref 22–32)
Chloride: 107 mmol/L (ref 101–111)
Creatinine, Ser: 0.76 mg/dL (ref 0.44–1.00)
GLUCOSE: 141 mg/dL — AB (ref 65–99)
Potassium: 3.5 mmol/L (ref 3.5–5.1)
Sodium: 139 mmol/L (ref 135–145)

## 2016-09-20 LAB — GLUCOSE, CAPILLARY
GLUCOSE-CAPILLARY: 222 mg/dL — AB (ref 65–99)
GLUCOSE-CAPILLARY: 286 mg/dL — AB (ref 65–99)
Glucose-Capillary: 103 mg/dL — ABNORMAL HIGH (ref 65–99)
Glucose-Capillary: 126 mg/dL — ABNORMAL HIGH (ref 65–99)
Glucose-Capillary: 203 mg/dL — ABNORMAL HIGH (ref 65–99)
Glucose-Capillary: 171 mg/dL — ABNORMAL HIGH (ref 65–99)

## 2016-09-20 MED ORDER — INSULIN ASPART 100 UNIT/ML ~~LOC~~ SOLN
0.0000 [IU] | SUBCUTANEOUS | Status: DC
  Administered 2016-09-20: 2 [IU] via SUBCUTANEOUS
  Administered 2016-09-20: 3 [IU] via SUBCUTANEOUS
  Administered 2016-09-20: 1 [IU] via SUBCUTANEOUS
  Administered 2016-09-20: 5 [IU] via SUBCUTANEOUS
  Administered 2016-09-20: 3 [IU] via SUBCUTANEOUS
  Administered 2016-09-21: 2 [IU] via SUBCUTANEOUS
  Administered 2016-09-21: 3 [IU] via SUBCUTANEOUS
  Administered 2016-09-21: 1 [IU] via SUBCUTANEOUS

## 2016-09-20 MED ORDER — INSULIN GLARGINE 100 UNIT/ML ~~LOC~~ SOLN
20.0000 [IU] | Freq: Two times a day (BID) | SUBCUTANEOUS | Status: DC
  Administered 2016-09-20 – 2016-09-21 (×2): 20 [IU] via SUBCUTANEOUS
  Filled 2016-09-20 (×3): qty 0.2

## 2016-09-20 NOTE — Progress Notes (Signed)
Patient states she will place CPAP on when ready for bed. RT will continue to monitor.

## 2016-09-20 NOTE — Progress Notes (Signed)
Pharmacy Antibiotic Note  [name] is a 66 y.o. female admitted on 09/16/2016 with SBO/intra-abdominal infection.  Pharmacy has been consulted for zosyn dosing.  Today is day 4 zosyn and patient remains afebrile with WBC within normal limits. Slowly advancing diet.  Plan: Zosyn 3.375g IV q8h (4 hour infusion).  Monitor renal function, length of therapy   Height: 5\' 2"  (157.5 cm) Weight: 272 lb 11.2 oz (123.7 kg) IBW/kg (Calculated) : 50.1  Temp (24hrs), Avg:98.4 F (36.9 C), Min:98.1 F (36.7 C), Max:98.9 F (37.2 C)   Recent Labs Lab 09/16/16 1716 09/16/16 1740 09/16/16 2253 09/17/16 0454 09/17/16 1742 09/18/16 0447 09/20/16 0801  WBC 21.5*  --   --  13.7*  --  8.1 5.9  CREATININE 0.93  --   --  0.74  --  0.72 0.76  LATICACIDVEN  --  4.48* 2.1*  --  0.8  --   --     Estimated Creatinine Clearance: 88 mL/min (by C-G formula based on SCr of 0.76 mg/dL).    Allergies  Allergen Reactions  . Demerol Nausea Only    Severe  . Hydromorphone Hcl Itching    All over the body.  . Tetracycline Hives    Antimicrobials this admission: Zosyn 09/16/2016 >>   Dose adjustments this admission: -  Microbiology results: 6/8 bcx x2: NG x2 days  Thank you for allowing pharmacy to be a part of this patient's care.  Demetrius Charity, PharmD Acute Care Pharmacy Resident  Pager: [phone] 09/20/2016

## 2016-09-20 NOTE — Progress Notes (Addendum)
PROGRESS NOTE    [name]  UVO:[id number] DOB: 1951-01-05 DOA: 09/16/2016 PCP: Asencion Noble, MD   Brief Narrative: This is a 66 year old female with multiple medical problems and a long-standing giant ventral hernia presented to the emergency department was abdominal pain and nausea and vomiting. She was found to have SBO on CT, without any evidence of bowel ischemia. Surgery consulted, recommended conservative management in view of her extensive medical problems and also recurrent hernia. Over the last 3 days, pt has been npo and NG tube placed and was on intermittent suction. NG tube clamped yesterday and started her on sips of clears. She was able to tolerate the sips without any complaints.  She had 3 BM yesterday. NG tube taken out and advancing diet as per surgery. Soft diet for this afternoon and if able to tolerate soft diet without any complaints, she can be discharged in the next day or two.   Assessment & Plan:   Principal Problem:   Small bowel obstruction (HCC) Active Problems:   Obstructive sleep apnea   COPD (chronic obstructive pulmonary disease) with emphysema (HCC)   Recurrent ventral incisional hernia   Insulin-requiring or dependent type II diabetes mellitus ([place])   Rheumatoid arthritis ([place])   Small bowel obstruction:  Probably secondary to bowel adhesions.  Bowel rest, was on NG tube with intermittent suction.  Nausea and vomiting have improved. abd pain has improved, but still requesting medications. No signs of peritonitis.  Ng tube clamped on 6/11 and start her onclears . Able to tolerate, .  Ng TUBE discontinued and advancing diet as per surgery.  Surgery following.     COPD:  No wheezing heard. Resume home bronchodilators as needed.    Insulin dep DM:  CBG (last 3)   Recent Labs  09/20/16 0338 09/20/16 0743 09/20/16 1202  GLUCAP 103* 126* 203*   cbg's every 4 hours. Continue sensistive scale SSI. Now that she is eating solid food , slowly  increase her lantus to her home dose. Today she is on 10 units BID, will increase it to 20 units BID.    Rheumatoid arthritis: stable. No change in meds.   OSA/ MORBID OBESITY : no change in meds . Outpatient follow up.    Ventral hernia:  No signs of incarceration of bowel.   Hypertension:  Well controlled.    Marked leukocytosis and elevated lactic acid:  Possibly from dehydration. No signs of infection or bowel ischemia. Leukocytosis has resolved and repeat lactic acid levels Is normal. Was started on zosyn empirically on admission. Complete 5 days of ZOSYN, today will be the last, d.c zosyn after today's dose.   DVT prophylaxis: (scd's Code Status: (full code.  Family Communication: none at bedside. Discussed the plan of care with the patient.  Disposition Plan: home in 1 to 2 days.  Consultants:   Surgery.    Procedures: none.    Antimicrobials: zosyn since admission d/c after today's dose.    Subjective: Had 3 BM yesterday. Feels better not back to baseline though. No Nausea, vomiting and abd pain.  Vitals:   09/19/16 2119 09/19/16 2202 09/20/16 0604 09/20/16 0735  BP:  (!) 119/59 (!) 148/75   Pulse:  72 71   Resp: 18 18 18    Temp:  98.9 F (37.2 C) 98.1 F (36.7 C)   TempSrc:  Oral Oral   SpO2: 95% (!) 5% 97% 96%  Weight:      Height:  Intake/Output Summary (Last 24 hours) at 09/20/16 1320 Last data filed at 09/20/16 1058  Gross per 24 hour  Intake             1160 ml  Output             1950 ml  Net             -790 ml   Filed Weights   09/17/16 0700 09/18/16 0440 09/19/16 0445  Weight: 123.9 kg (273 lb 2.4 oz) 123.9 kg (273 lb 2.4 oz) 123.7 kg (272 lb 11.2 oz)    Examination:  General exam: awake and walking.   Respiratory system: clear to auscultation. No wheezing or rhonchi.  Cardiovascular system: s1s2, RRR.  Gastrointestinal system: massive abdominal hernia, non reducible, mid line scar present,  abd soft , non distended, no  tenderness.  no rebound tenderness. Bowel sounds NORMAL.  Central nervous system: alert and oriented. Non focal.  Extremities:trace pedal edema.  Skin: No rashes, lesions or ulcers Psychiatry: normal mood.     Data Reviewed: I have personally reviewed following labs and imaging studies  CBC:  Recent Labs Lab 09/16/16 1716 09/17/16 0454 09/18/16 0447 09/20/16 0801  WBC 21.5* 13.7* 8.1 5.9  NEUTROABS  --  11.0*  --   --   HGB 15.6* 13.2 12.8 12.9  HCT 47.4* 41.6 41.8 40.9  MCV 81.2 83.0 84.3 82.1  PLT 291 230 188 403   Basic Metabolic Panel:  Recent Labs Lab 09/16/16 1716 09/17/16 0454 09/18/16 0447 09/20/16 0801  NA 138 143 141 139  K 4.3 4.0 3.9 3.5  CL 100* 107 107 107  CO2 23 29 28 23   GLUCOSE 272* 107* 85 141*  BUN 24* 19 17 11   CREATININE 0.93 0.74 0.72 0.76  CALCIUM 10.4* 9.1 8.4* 8.5*   GFR: Estimated Creatinine Clearance: 88 mL/min (by C-G formula based on SCr of 0.76 mg/dL). Liver Function Tests:  Recent Labs Lab 09/16/16 1716  AST 25  ALT 28  ALKPHOS 85  BILITOT 0.6  PROT 8.6*  ALBUMIN 4.4    Recent Labs Lab 09/16/16 1716  LIPASE 32   No results for input(s): AMMONIA in the last 168 hours. Coagulation Profile: No results for input(s): INR, PROTIME in the last 168 hours. Cardiac Enzymes: No results for input(s): CKTOTAL, CKMB, CKMBINDEX, TROPONINI in the last 168 hours. BNP (last 3 results) No results for input(s): PROBNP in the last 8760 hours. HbA1C: No results for input(s): HGBA1C in the last 72 hours. CBG:  Recent Labs Lab 09/19/16 2045 09/19/16 2328 09/20/16 0338 09/20/16 0743 09/20/16 1202  GLUCAP 93 115* 103* 126* 203*   Lipid Profile: No results for input(s): CHOL, HDL, LDLCALC, TRIG, CHOLHDL, LDLDIRECT in the last 72 hours. Thyroid Function Tests: No results for input(s): TSH, T4TOTAL, FREET4, T3FREE, THYROIDAB in the last 72 hours. Anemia Panel: No results for input(s): VITAMINB12, FOLATE, FERRITIN, TIBC, IRON,  RETICCTPCT in the last 72 hours. Sepsis Labs:  Recent Labs Lab 09/16/16 1740 09/16/16 2253 09/17/16 1742  LATICACIDVEN 4.48* 2.1* 0.8    Recent Results (from the past 240 hour(s))  Culture, blood (routine x 2)     Status: None (Preliminary result)   Collection Time: 09/16/16 10:45 PM  Result Value Ref Range Status   Specimen Description BLOOD LEFT ARM  Final   Special Requests   Final    BOTTLES DRAWN AEROBIC AND ANAEROBIC Blood Culture adequate volume   Culture   Final    NO  GROWTH 2 DAYS Performed at [facility], [place] [address], [place], [place] [address]    Report Status PENDING  Incomplete  Culture, blood (routine x 2)     Status: None (Preliminary result)   Collection Time: 09/16/16 10:54 PM  Result Value Ref Range Status   Specimen Description BLOOD RIGHT ARM  Final   Special Requests   Final    BOTTLES DRAWN AEROBIC AND ANAEROBIC Blood Culture adequate volume   Culture   Final    NO GROWTH 2 DAYS Performed at [facility], [place] [address], [place],  [address]    Report Status PENDING  Incomplete         Radiology Studies: Dg Abd 2 Views  Result Date: 09/19/2016 CLINICAL DATA:  Followup small bowel obstruction. EXAM: ABDOMEN - 2 VIEW COMPARISON:  09/17/2016 FINDINGS: Nasogastric tube is within the stomach. Large hernia projecting towards the right as seen previously. There are persistent dilated loops within the hernia consistent with ongoing partial small bowel obstruction. The overall pattern is not worsened. No sign of free air. IMPRESSION: Large hernia projecting towards the right. Persistent dilated gas-filled loops in the hernia consistent with ongoing partial small bowel obstruction. Electronically Signed   By: Nelson Chimes M.D.   On: 09/19/2016 09:26        Scheduled Meds: . folic acid  1 mg Intravenous Daily  . insulin aspart  0-9 Units Subcutaneous Q4H  . insulin glargine  10 Units Subcutaneous BID  . mometasone-formoterol  2  puff Inhalation BID  . tiotropium  18 mcg Inhalation Daily   Continuous Infusions: . sodium chloride Stopped (09/18/16 1700)  . dextrose 5 % and 0.9% NaCl 50 mL/hr at 09/20/16 1140  . piperacillin-tazobactam (ZOSYN)  IV Stopped (09/20/16 0941)     LOS: 4 days    Time spent: 30 minutes.     Hosie Poisson, MD [facility] Pager [phone]   If 7PM-7AM, please contact night-coverage [email or web address] Password [email or web address] 09/20/2016, 1:20 PM

## 2016-09-20 NOTE — Progress Notes (Signed)
Pt tolerated her soft diet today with no issues.  No complaints of pain since early this morning.  Ambulating in the hallway several times today.  Will continue to monitor.

## 2016-09-20 NOTE — Care Management Important Message (Signed)
Important Message  Patient Details  Name: BETHANNE MULE MRN: [id number] Date of Birth: [birth year]   Medicare Important Message Given:  Yes    Kerin Salen 09/20/2016, 10:57 AMImportant Message  Patient Details  Name: BRANDON SCARBROUGH MRN: [id number] Date of Birth: Dec 04, 1950   Medicare Important Message Given:  Yes    Kerin Salen 09/20/2016, 10:57 AM

## 2016-09-20 NOTE — Progress Notes (Signed)
[facility] Progress Note      Subjective:  CC: abdominal pain  Pt had 2 BM's yesterday. Tolerating clears. No nausea, vomiting, fever or chills overnight. Abdominal pain is improved.   Objective: Vital signs in last 24 hours: Temp:  [98.1 F (36.7 C)-98.9 F (37.2 C)] 98.1 F (36.7 C) (06/12 0604) Pulse Rate:  [71-76] 71 (06/12 0604) Resp:  [18] 18 (06/12 0604) BP: (119-148)/(59-75) 148/75 (06/12 0604) SpO2:  [5 %-98 %] 96 % (06/12 0735) Last BM Date: 09/19/16  Intake/Output from previous day: 06/11 0701 - 06/12 0700 In: 635 [I.V.:635] Out: 1750 [Urine:1750] Intake/Output this shift: No intake/output data recorded.  PE: Gen:  Alert, NAD, pleasant, cooperative, well appearing Card:  RRR, no M/G/R heard Pulm:  Rate and effort normal Abd: Soft, not distended, large right sided hernia, +BS, mild TTP on right side, obese abdomen Skin: no rashes noted, warm and dry  Lab Results:   Recent Labs  09/18/16 0447 09/20/16 0801  WBC 8.1 5.9  HGB 12.8 12.9  HCT 41.8 40.9  PLT 188 178   BMET  Recent Labs  09/18/16 0447 09/20/16 0801  NA 141 139  K 3.9 3.5  CL 107 107  CO2 28 23  GLUCOSE 85 141*  BUN 17 11  CREATININE 0.72 0.76  CALCIUM 8.4* 8.5*   PT/INR No results for input(s): LABPROT, INR in the last 72 hours. CMP     Component Value Date/Time   NA 139 09/20/2016 0801   K 3.5 09/20/2016 0801   CL 107 09/20/2016 0801   CO2 23 09/20/2016 0801   GLUCOSE 141 (H) 09/20/2016 0801   BUN 11 09/20/2016 0801   CREATININE 0.76 09/20/2016 0801   CREATININE 0.84 05/10/2016 1347   CALCIUM 8.5 (L) 09/20/2016 0801   PROT 8.6 (H) 09/16/2016 1716   ALBUMIN 4.4 09/16/2016 1716   AST 25 09/16/2016 1716   ALT 28 09/16/2016 1716   ALKPHOS 85 09/16/2016 1716   BILITOT 0.6 09/16/2016 1716   GFRNONAA >60 09/20/2016 0801   GFRAA >60 09/20/2016 0801   Lipase     Component Value Date/Time   LIPASE 32 09/16/2016 1716    Studies/Results: Dg Abd  2 Views  Result Date: 09/19/2016 CLINICAL DATA:  Followup small bowel obstruction. EXAM: ABDOMEN - 2 VIEW COMPARISON:  09/17/2016 FINDINGS: Nasogastric tube is within the stomach. Large hernia projecting towards the right as seen previously. There are persistent dilated loops within the hernia consistent with ongoing partial small bowel obstruction. The overall pattern is not worsened. No sign of free air. IMPRESSION: Large hernia projecting towards the right. Persistent dilated gas-filled loops in the hernia consistent with ongoing partial small bowel obstruction. Electronically Signed   By: Nelson Chimes M.D.   On: 09/19/2016 09:26    Anti-infectives: Anti-infectives    Start     Dose/Rate Route Frequency Ordered Stop   09/16/16 2245  piperacillin-tazobactam (ZOSYN) IVPB 3.375 g     3.375 g 12.5 mL/hr over 240 Minutes Intravenous Every 8 hours 09/16/16 2239         Assessment/Plan OSA COPD Type II diabetes RA Body mass index is 49.8  SBO with recurrent large ventral Hernia/loss of Domain - appears this is resolving, no indication for surgical intervention at this point  FEN: soft diet ID: Zosyn 09/16/16 =>> day 4 DVT:  SCD's only - OK to be on anticoagulant from our standpoint  Plan: soft diet, we will continue to follow     LOS:  4 days    [facility] 09/20/2016, 9:29 AM Pager: [phone] Consults: [phone] Mon-Fri 7:00 am-4:30 pm Sat-Sun 7:00 am-11:30 am

## 2016-09-20 NOTE — Progress Notes (Signed)
RT gave patient nasal mask per patient home request. Patient stated she could probably place on herself. RT instructed patient to call if she needed any help. RT will continue to monitor.

## 2016-09-21 DIAGNOSIS — G4733 Obstructive sleep apnea (adult) (pediatric): Secondary | ICD-10-CM

## 2016-09-21 DIAGNOSIS — R7989 Other specified abnormal findings of blood chemistry: Secondary | ICD-10-CM

## 2016-09-21 DIAGNOSIS — M069 Rheumatoid arthritis, unspecified: Secondary | ICD-10-CM

## 2016-09-21 DIAGNOSIS — J439 Emphysema, unspecified: Secondary | ICD-10-CM

## 2016-09-21 DIAGNOSIS — R112 Nausea with vomiting, unspecified: Secondary | ICD-10-CM

## 2016-09-21 DIAGNOSIS — E119 Type 2 diabetes mellitus without complications: Secondary | ICD-10-CM

## 2016-09-21 DIAGNOSIS — Z794 Long term (current) use of insulin: Secondary | ICD-10-CM

## 2016-09-21 DIAGNOSIS — K56609 Unspecified intestinal obstruction, unspecified as to partial versus complete obstruction: Secondary | ICD-10-CM

## 2016-09-21 DIAGNOSIS — K432 Incisional hernia without obstruction or gangrene: Secondary | ICD-10-CM

## 2016-09-21 LAB — GLUCOSE, CAPILLARY
GLUCOSE-CAPILLARY: 131 mg/dL — AB (ref 65–99)
Glucose-Capillary: 180 mg/dL — ABNORMAL HIGH (ref 65–99)
Glucose-Capillary: 201 mg/dL — ABNORMAL HIGH (ref 65–99)

## 2016-09-21 NOTE — Progress Notes (Signed)
[facility] Progress Note      Subjective:  CC: mild abdominal soreness  Pt states she is tolerating her diet and having BM's. Not sleeping well. No other complaints and no fever or vomiting.   Objective: Vital signs in last 24 hours: Temp:  [98.3 F (36.8 C)-98.6 F (37 C)] 98.3 F (36.8 C) (06/13 0520) Pulse Rate:  [72-73] 73 (06/13 0520) Resp:  [18] 18 (06/13 0520) BP: (123-133)/(52-75) 123/52 (06/13 0520) SpO2:  [96 %-98 %] 97 % (06/13 0520) Last BM Date: 09/19/16  Intake/Output from previous day: 06/12 0701 - 06/13 0700 In: 2550 [P.O.:1800; I.V.:650; IV Piggyback:100] Out: 2947 [Urine:3350; Stool:2] Intake/Output this shift: No intake/output data recorded.  PE: Gen:  Alert, NAD, pleasant, cooperative, well appearing Card:  RRR, no M/G/R heard Pulm:  Rate and effort normal Abd: Soft, not distended, large right sided hernia, +BS, mild TTP on right side, obese abdomen Skin: no rashes noted, warm and dry  Lab Results:   Recent Labs  09/20/16 0801  WBC 5.9  HGB 12.9  HCT 40.9  PLT 178   BMET  Recent Labs  09/20/16 0801  NA 139  K 3.5  CL 107  CO2 23  GLUCOSE 141*  BUN 11  CREATININE 0.76  CALCIUM 8.5*   PT/INR No results for input(s): LABPROT, INR in the last 72 hours. CMP     Component Value Date/Time   NA 139 09/20/2016 0801   K 3.5 09/20/2016 0801   CL 107 09/20/2016 0801   CO2 23 09/20/2016 0801   GLUCOSE 141 (H) 09/20/2016 0801   BUN 11 09/20/2016 0801   CREATININE 0.76 09/20/2016 0801   CREATININE 0.84 05/10/2016 1347   CALCIUM 8.5 (L) 09/20/2016 0801   PROT 8.6 (H) 09/16/2016 1716   ALBUMIN 4.4 09/16/2016 1716   AST 25 09/16/2016 1716   ALT 28 09/16/2016 1716   ALKPHOS 85 09/16/2016 1716   BILITOT 0.6 09/16/2016 1716   GFRNONAA >60 09/20/2016 0801   GFRAA >60 09/20/2016 0801   Lipase     Component Value Date/Time   LIPASE 32 09/16/2016 1716    Studies/Results: Dg Abd 2 Views  Result Date:  09/19/2016 CLINICAL DATA:  Followup small bowel obstruction. EXAM: ABDOMEN - 2 VIEW COMPARISON:  09/17/2016 FINDINGS: Nasogastric tube is within the stomach. Large hernia projecting towards the right as seen previously. There are persistent dilated loops within the hernia consistent with ongoing partial small bowel obstruction. The overall pattern is not worsened. No sign of free air. IMPRESSION: Large hernia projecting towards the right. Persistent dilated gas-filled loops in the hernia consistent with ongoing partial small bowel obstruction. Electronically Signed   By: Nelson Chimes M.D.   On: 09/19/2016 09:26    Anti-infectives: Anti-infectives    Start     Dose/Rate Route Frequency Ordered Stop   09/16/16 2245  piperacillin-tazobactam (ZOSYN) IVPB 3.375 g     3.375 g 12.5 mL/hr over 240 Minutes Intravenous Every 8 hours 09/16/16 2239         Assessment/Plan OSA COPD Type II diabetes RA Body mass index is 49.8  SBO with recurrent large ventral Hernia/loss of Domain - appears this is resolving, no indication for surgical intervention at this point  FEN: soft diet ID: Zosyn 09/16/16>> this can be stopped DVT: SCD's - OK to be on anticoagulant from our standpoint  Plan: tolerating diet and having BM's, okay for discharge from a surgical standpoint.     LOS: 5 days  Kalman Drape , [facility] 09/21/2016, 7:28 AM Pager: [phone] Consults: [phone] Mon-Fri 7:00 am-4:30 pm Sat-Sun 7:00 am-11:30 am

## 2016-09-21 NOTE — Care Management Note (Signed)
Case Management Note  Patient Details  Name: [name] MRN: [id number] Date of Birth: Feb 16, 1951  Subjective/Objective:  No CM needs or orders.                  Action/Plan:d/c home.   Expected Discharge Date:  09/21/16               Expected Discharge Plan:  Home/Self Care  In-House Referral:     Discharge planning Services  CM Consult  Post Acute Care Choice:    Choice offered to:     DME Arranged:    DME Agency:     HH Arranged:    HH Agency:     Status of Service:  Completed, signed off  If discussed at [employer] of Stay Meetings, dates discussed:    Additional Comments:  Dessa Phi, RN 09/21/2016, 11:04 AM

## 2016-09-21 NOTE — Discharge Summary (Signed)
Physician Discharge Summary  [name] WFU:[id number] DOB: 12/16/1950 DOA: 09/16/2016  PCP: Wendy Noble, MD  Admit date: 09/16/2016 Discharge date: 09/21/2016  Time spent: 45 minutes  Recommendations for Outpatient Follow-up:  Patient will be discharged to home.  Patient will need to follow up with primary care provider within one week of discharge, repeat BMP. Patient should continue medications as prescribed.  Patient should follow a soft diet and advance to heart healthy/carb modified as tolerated.   Discharge Diagnoses:  Small bowel obstruction COPD Diabetes mellitus, type II with neuropathy Rheumatoid arthritis Obstructive sleep apnea morbid obesity Ventral hernia Essential Hypertension Leukocytosis/elevated lactic acid GERD Hyperlipidemia  Discharge Condition: Stable  Diet recommendation:  soft diet and advance to heart healthy/carb modified as tolerated  Filed Weights   09/17/16 0700 09/18/16 0440 09/19/16 0445  Weight: 123.9 kg (273 lb 2.4 oz) 123.9 kg (273 lb 2.4 oz) 123.7 kg (272 lb 11.2 oz)    History of present illness:  On 09/16/2016 by Dr. Christia Reading [name] [name] is a 66 y.o. female with medical history significant for COPD, obstructive sleep apnea on CPAP, morbid obesity, insulin-dependent diabetes mellitus, and hypertension, now presenting to the emergency department with severe abdominal pain that began today.  Patient reports that she went to bed last night her usual state of health but woke this morning with a vague sense of fullness in her abdomen.  Over the course of the day, her pain progressively worsened, particularly after eating breakfast. She went on to develop nausea with nonbloody nonbilious vomiting. Pain continued to worsen and became severe, prompting her to seek evaluation in the emergency department.  Pain is described as severe, localized to the mid and lower abdomen, worse with food or movement, and with no alleviating factors identified. She took  2 tabs of Advil at home earlier in the day. She has had a normal bowel movement this morning. She denies fevers or chills and denies chest pain or palpitations. She reports that her COPD has been very stable with no recent cough or wheezing and no significant dyspnea.  Hospital Course:  Small bowel obstruction -Secondary to adhesions  -patient has large ventral hernia  -General surgery consulted and appreciated -CT scan abdomen pelvis with contrast showed high-grade small bowel obstruction, transition point identified without identifiable obstructive cause, likely adhesion or internal hernia -Patient did have NG tube placed. Nausea and vomiting improved. -NG tube was discontinued, patient's diet was advanced. -Currently on a soft diet and maintaining and tolerating it well. No further complaints of nausea and vomiting.  COPD -Continue home medications upon discharge, Advair, pro-air, spelled Riva -Currently stable, no wheezing noted  Diabetes mellitus, type II with neuropathy -Continue home meds upon discharge: Albiglutide, Jardiance, NovoLog, Lantus, metoprolol -Continue gabapentin for neuropathy  Rheumatoid arthritis -Continue Remicade injection, methotrexate  Obstructive sleep apnea -Continue CPAP  Morbid obesity -Patient will need to continue outpatient follow-up, discuss lifestyle modifications upon discharge with her primary care physician  Ventral hernia -No signs of incarceration of the bowel -Per patient, she has told she is a poor surgical candidate  Essential Hypertension -Continue losartan, Lasix  Leukocytosis/elevated lactic acid -Resolved, likely secondary to above etiology and dehydration -Patient had no signs of infection or bowel ischemia -Was placed on Zosyn and completed 5 days  GERD -Continue PPI  Hyperlipidemia -Continue fish oil, statin  Procedures: None  Consultations: General surgery  Discharge Exam: Vitals:   09/20/16 2138 09/21/16 0520    BP: (!) 133/58 (!) 123/52  Pulse: 72 73  Resp: 18 18  Temp: 98.6 F (37 C) 98.3 F (36.8 C)   Patient denies further nausea or vomiting. Was able to have bowel movements. Complains of not sleeping well last night. Denies chest pain, shortness of breath, dizziness, headache. Does complain of mild abdominal soreness and hip pain.    General: Well developed, well nourished, NAD, appears stated age  HEENT: NCAT, mucous membranes moist.  Cardiovascular: S1 S2 auscultated, no rubs, murmurs or gallops. 1/6SEM  Respiratory: Clear to auscultation bilaterally with equal chest rise  Abdomen: Soft, obese, large right sided hernia, mild right TTP, nondistended, + bowel sounds  Extremities: warm dry without cyanosis clubbing. Trace LE edema B/L  Neuro: AAOx3, nonfocal  Skin: Without rashes exudates or nodules  Psych: Normal affect and demeanor with intact judgement and insight  Discharge Instructions Discharge Instructions    Discharge instructions    Complete by:  As directed    Patient will be discharged to home.  Patient will need to follow up with primary care provider within one week of discharge, repeat BMP. Patient should continue medications as prescribed.  Patient should follow a soft diet and advance to heart healthy/carb modified as tolerated.     Current Discharge Medication List    CONTINUE these medications which have NOT CHANGED   Details  ADVAIR HFA 115-21 MCG/ACT inhaler USE 2 INHALATIONS TWICE A DAY Qty: 36 g, Refills: 2    Albiglutide (TANZEUM) 30 MG PEN Inject 30 mg into the skin once a week.    ALPRAZolam (XANAX) 0.5 MG tablet Take 0.25-0.5 mg by mouth 3 (three) times daily as needed for sleep or anxiety.    aspirin 81 MG tablet Take 81 mg by mouth daily.    Calcium Carbonate-Vitamin D (CALCIUM 600+D) 600-400 MG-UNIT per tablet Take 1 tablet by mouth 2 (two) times daily.      empagliflozin (JARDIANCE) 25 MG TABS tablet Take 25 mg by mouth daily.     etodolac (LODINE) 400 MG tablet Take 400 mg by mouth 2 (two) times daily.    fexofenadine (ALLEGRA) 180 MG tablet Take 180 mg by mouth daily.      fish oil-omega-3 fatty acids 1000 MG capsule Take 2 g by mouth every evening.     folic acid (FOLVITE) 1 MG tablet Take 1 mg by mouth daily.    furosemide (LASIX) 40 MG tablet Take 40 mg by mouth daily as needed for fluid. Fluid    gabapentin (NEURONTIN) 600 MG tablet Take 600 mg by mouth at bedtime.    ibuprofen (ADVIL,MOTRIN) 200 MG tablet Take 400-600 mg by mouth every 8 (eight) hours as needed. Pain    InFLIXimab (REMICADE IV) Inject into the vein. Every 8 weeks    insulin aspart (NOVOLOG) 100 UNIT/ML injection Inject 18 Units into the skin 3 (three) times daily before meals. Inject 18 Units subcutaneously every morning, 18 Units subcutaneously at lunchtime IF NEEDED and 18 Units subcutaneously at bedtime as directed    insulin glargine (LANTUS) 100 UNIT/ML injection Inject 80 Units into the skin 2 (two) times daily. Per patient 80 in am and 80 in pm.    losartan (COZAAR) 100 MG tablet Take 100 mg by mouth daily.    metFORMIN (GLUCOPHAGE-XR) 500 MG 24 hr tablet Take 2,000 mg by mouth daily.      methocarbamol (ROBAXIN) 750 MG tablet Take 750 mg by mouth every 8 (eight) hours as needed. Muscle Spasms    methotrexate (RHEUMATREX) 2.5  MG tablet Take 20 mg by mouth once a week. Caution:Chemotherapy. Protect from light.    Multiple Vitamins-Minerals (ICAPS MV PO) Take 1 tablet by mouth 2 (two) times daily.    omeprazole (PRILOSEC) 40 MG capsule TAKE 1 CAPSULE EVERY MORNING Qty: 90 capsule, Refills: 2    PROAIR HFA 108 (90 BASE) MCG/ACT inhaler INHALE 2 PUFFS INTO THE LUNGS EVERY 6 HOURS AS NEEDED FOR WHEEZING OR SHORTNESS OF BREATH Qty: 3 each, Refills: 3    simvastatin (ZOCOR) 40 MG tablet Take 40 mg by mouth daily with supper.     tiotropium (SPIRIVA) 18 MCG inhalation capsule Place 18 mcg into inhaler and inhale daily.          Allergies  Allergen Reactions  . Demerol Nausea Only    Severe  . Hydromorphone Hcl Itching    All over the body.  . Tetracycline Hives   Follow-up Information    Wendy Noble, MD. Schedule an appointment as soon as possible for a visit in 1 week(s).   Specialty:  Internal Medicine Why:  Hospital follow up Contact information: [address] [place] [address] [phone]            The results of significant diagnostics from this hospitalization (including imaging, microbiology, ancillary and laboratory) are listed below for reference.    Significant Diagnostic Studies: Ct Abdomen Pelvis W Contrast  Addendum Date: 09/16/2016   ADDENDUM REPORT: 09/16/2016 21:58 ADDENDUM: History should read known abdominal wall hernia INSTEAD of abdominal aortic aneurysm. Electronically Signed   By: Margarette Canada M.D.   On: 09/16/2016 21:58   Result Date: 09/16/2016 CLINICAL DATA:  66 year old female with abdominal and pelvic pain and nausea and vomiting for 2 hours. Known abdominal aortic aneurysm EXAM: CT ABDOMEN AND PELVIS WITH CONTRAST TECHNIQUE: Multidetector CT imaging of the abdomen and pelvis was performed using the standard protocol following bolus administration of intravenous contrast. CONTRAST:  124mL ISOVUE-300 IOPAMIDOL (ISOVUE-300) INJECTION 61% COMPARISON:  01/21/2009 FINDINGS: Lower chest: No acute abnormality. Hepatobiliary: The liver is unremarkable. The patient is status post cholecystectomy. There is no evidence of biliary dilatation. Pancreas: Unremarkable Spleen: Unremarkable Adrenals/Urinary Tract: The kidneys, adrenal glands and bladder are unremarkable. Stomach/Bowel: A very large anterior abdominal/ pelvic wall hernia containing the majority of the small bowel and colon again noted. Dilated stool filled proximal and mid small bowel loops are noted with collapsed distal small bowel loops compatible with small bowel obstruction. The small bowel transition point lies  within the lower aspect of the hernia without obstructing cause identified and may be related to an adhesion or internal hernia. There is no evidence of pneumoperitoneum Vascular/Lymphatic: Aortic atherosclerosis. No enlarged abdominal or pelvic lymph nodes. Reproductive: Status post hysterectomy. No adnexal masses. Other: No ascites or abscess. Musculoskeletal: No acute abnormality identified. Multilevel degenerative changes within the lumbar spine identified. IMPRESSION: High-grade small bowel obstruction occurring in the very large abdominal/ pelvic wall hernia. Transition point identified without identifiable obstructing cause - probably related to adhesion or internal hernia. No evidence of pneumoperitoneum or abscess. Aortic Atherosclerosis (ICD10-I70.0). Electronically Signed: By: Margarette Canada M.D. On: 09/16/2016 19:32   Dg Abd 2 Views  Result Date: 09/19/2016 CLINICAL DATA:  Followup small bowel obstruction. EXAM: ABDOMEN - 2 VIEW COMPARISON:  09/17/2016 FINDINGS: Nasogastric tube is within the stomach. Large hernia projecting towards the right as seen previously. There are persistent dilated loops within the hernia consistent with ongoing partial small bowel obstruction. The overall pattern is not worsened. No  sign of free air. IMPRESSION: Large hernia projecting towards the right. Persistent dilated gas-filled loops in the hernia consistent with ongoing partial small bowel obstruction. Electronically Signed   By: Nelson Chimes M.D.   On: 09/19/2016 09:26   Dg Abd 2 Views  Result Date: 09/17/2016 CLINICAL DATA:  66 year old female with abdominal pain and distension. Admitted yesterday, NG tube placed. EXAM: ABDOMEN - 2 VIEW COMPARISON:  09/16/2016 abdominal radiograph and CT Abdomen and Pelvis FINDINGS: Supine and left-side-down lateral decubitus views of the abdomen. NG tube side hole is at the level of the gastric body. Abundant right lateral abdominal bowel loops are noted and are probably within  the very large ventral right abdominal hernia. Most of the bowel gas pattern appears nonobstructed although there are gas-filled small bowel loops up to 42 mm diameter. No acute osseous abnormality identified. Stable cholecystectomy clips. IMPRESSION: 1. NG tube side hole up the level of the gastric body. 2. Partial small bowel obstruction suspected, with abundant small and large bowel gas including within the large ventral abdominal hernia. Some mildly to moderately dilated small bowel loops are evident. 3. No free air. Electronically Signed   By: Genevie Ann M.D.   On: 09/17/2016 10:39   Dg Abd Portable 1v  Result Date: 09/16/2016 CLINICAL DATA:  NG tube placement EXAM: PORTABLE ABDOMEN - 1 VIEW COMPARISON:  CT abdomen pelvis 09/16/2016 FINDINGS: The side port of the nasogastric tube is near the gastroesophageal junction. Recommend advancing by 5-7 cm. IMPRESSION: NG tube side port at the GE junction. Recommend advancing by 5-7 cm. Electronically Signed   By: Ulyses Jarred M.D.   On: 09/16/2016 21:16    Microbiology: Recent Results (from the past 240 hour(s))  Culture, blood (routine x 2)     Status: None (Preliminary result)   Collection Time: 09/16/16 10:45 PM  Result Value Ref Range Status   Specimen Description BLOOD LEFT ARM  Final   Special Requests   Final    BOTTLES DRAWN AEROBIC AND ANAEROBIC Blood Culture adequate volume   Culture   Final    NO GROWTH 3 DAYS Performed at [facility], [address] [place], [place] [address]    Report Status PENDING  Incomplete  Culture, blood (routine x 2)     Status: None (Preliminary result)   Collection Time: 09/16/16 10:54 PM  Result Value Ref Range Status   Specimen Description BLOOD RIGHT ARM  Final   Special Requests   Final    BOTTLES DRAWN AEROBIC AND ANAEROBIC Blood Culture adequate volume   Culture   Final    NO GROWTH 3 DAYS Performed at [facility], [address], [place], [place] [address]    Report Status PENDING   Incomplete     Labs: Basic Metabolic Panel:  Recent Labs Lab 09/16/16 1716 09/17/16 0454 09/18/16 0447 09/20/16 0801  NA 138 143 141 139  K 4.3 4.0 3.9 3.5  CL 100* 107 107 107  CO2 23 29 28 23   GLUCOSE 272* 107* 85 141*  BUN 24* 19 17 11   CREATININE 0.93 0.74 0.72 0.76  CALCIUM 10.4* 9.1 8.4* 8.5*   Liver Function Tests:  Recent Labs Lab 09/16/16 1716  AST 25  ALT 28  ALKPHOS 85  BILITOT 0.6  PROT 8.6*  ALBUMIN 4.4    Recent Labs Lab 09/16/16 1716  LIPASE 32   No results for input(s): AMMONIA in the last 168 hours. CBC:  Recent Labs Lab 09/16/16  1716 09/17/16 0454 09/18/16 0447 09/20/16 0801  WBC 21.5* 13.7* 8.1 5.9  NEUTROABS  --  11.0*  --   --   HGB 15.6* 13.2 12.8 12.9  HCT 47.4* 41.6 41.8 40.9  MCV 81.2 83.0 84.3 82.1  PLT 291 230 188 178   Cardiac Enzymes: No results for input(s): CKTOTAL, CKMB, CKMBINDEX, TROPONINI in the last 168 hours. BNP: BNP (last 3 results) No results for input(s): BNP in the last 8760 hours.  ProBNP (last 3 results) No results for input(s): PROBNP in the last 8760 hours.  CBG:  Recent Labs Lab 09/20/16 1708 09/20/16 1955 09/20/16 2331 09/21/16 0349 09/21/16 0757  GLUCAP 171* 286* 222* 131* 180*       Signed:  Nobuo Nunziata  [facility] 09/21/2016, 10:55 AM

## 2016-09-21 NOTE — Discharge Instructions (Signed)
Small Bowel Obstruction °A small bowel obstruction is a blockage in the small bowel. The small bowel, which is also called the small intestine, is a long, slender tube that connects the stomach to the colon. When a person eats and drinks, food and fluids go from the stomach to the small bowel. This is where most of the nutrients in the food and fluids are absorbed. °A small bowel obstruction will prevent food and fluids from passing through the small bowel as they normally do during digestion. The small bowel can become partially or completely blocked. This can cause symptoms such as abdominal pain, vomiting, and bloating. If this condition is not treated, it can be dangerous because the small bowel could rupture. °What are the causes? °Common causes of this condition include: °· Scar tissue from previous surgery or radiation treatment. °· Recent surgery. This may cause the movements of the bowel to slow down and cause food to block the intestine. °· Hernias. °· Inflammatory bowel disease (colitis). °· Twisting of the bowel (volvulus). °· Tumors. °· A foreign body. °· Slipping of a part of the bowel into another part (intussusception). °What are the signs or symptoms? °Symptoms of this condition include: °· Abdominal pain. This may be dull cramps or sharp pain. It may occur in one area, or it may be present in the entire abdomen. Pain can range from mild to severe, depending on the degree of obstruction. °· Nausea and vomiting. Vomit may be greenish or a yellow bile color. °· Abdominal bloating. °· Constipation. °· Lack of passing gas. °· Frequent belching. °· Diarrhea. This may occur if the obstruction is partial and runny stool is able to leak around the obstruction. °How is this diagnosed? °This condition may be diagnosed based on a physical exam, medical history, and X-rays of the abdomen. You may also have other tests, such as a CT scan of the abdomen and pelvis. °How is this treated? °Treatment for this  condition depends on the cause and severity of the problem. Treatment options may include: °· Bed rest along with fluids and pain medicines that are given through an IV tube inserted into one of your veins. Sometimes, this is all that is needed for the obstruction to improve. °· Following a simple diet. In some cases, a clear liquid diet may be required for several days. This allows the bowel to rest. °· Placement of a small tube (nasogastric tube) into the stomach. When the bowel is blocked, it usually swells up like a balloon that is filled with air and fluids. The air and fluids may be removed by suction through the nasogastric tube. This can help with pain, discomfort, and nausea. It can also help the obstruction to clear up faster. °· Surgery. This may be required if other treatments do not work. Bowel obstruction from a hernia may require early surgery and can be an emergency procedure. Surgery may also be required for scar tissue that causes frequent or severe obstructions. °Follow these instructions at home: °· Get plenty of rest. °· Follow instructions from your health care provider about eating restrictions. You may need to avoid solid foods and consume only clear liquids until your condition improves. °· Take over-the-counter and prescription medicines only as told by your health care provider. °· Keep all follow-up visits as told by your health care provider. This is important. °Contact a health care provider if: °· You have a fever. °· You have chills. °Get help right away if: °· You have increased   pain or cramping. °· You vomit blood. °· You have uncontrolled vomiting or nausea. °· You cannot drink fluids because of vomiting or pain. °· You develop confusion. °· You begin feeling very dry or thirsty (dehydrated). °· You have severe bloating. °· You feel extremely weak or you faint. °This information is not intended to replace advice given to you by your health care provider. Make sure you discuss any  questions you have with your health care provider. °Document Released: 06/14/2005 Document Revised: 11/23/2015 Document Reviewed: 05/22/2014 °Elsevier Interactive Patient Education © 2017 [employer] ° °

## 2016-09-21 NOTE — Progress Notes (Signed)
No complaints of nausea or pain today. D/C instructions were given and understanding was verbalized.

## 2016-09-22 LAB — CULTURE, BLOOD (ROUTINE X 2)
CULTURE: NO GROWTH
CULTURE: NO GROWTH
SPECIAL REQUESTS: ADEQUATE
Special Requests: ADEQUATE

## 2016-09-28 ENCOUNTER — Other Ambulatory Visit: Payer: Self-pay | Admitting: Pulmonary Disease

## 2016-09-28 DIAGNOSIS — Z6841 Body Mass Index (BMI) 40.0 and over, adult: Secondary | ICD-10-CM | POA: Diagnosis not present

## 2016-09-28 DIAGNOSIS — M150 Primary generalized (osteo)arthritis: Secondary | ICD-10-CM | POA: Diagnosis not present

## 2016-09-28 DIAGNOSIS — M0589 Other rheumatoid arthritis with rheumatoid factor of multiple sites: Secondary | ICD-10-CM | POA: Diagnosis not present

## 2016-09-28 DIAGNOSIS — Z79899 Other long term (current) drug therapy: Secondary | ICD-10-CM | POA: Diagnosis not present

## 2016-09-29 DIAGNOSIS — K56699 Other intestinal obstruction unspecified as to partial versus complete obstruction: Secondary | ICD-10-CM | POA: Diagnosis not present

## 2016-10-03 DIAGNOSIS — L851 Acquired keratosis [keratoderma] palmaris et plantaris: Secondary | ICD-10-CM | POA: Diagnosis not present

## 2016-10-03 DIAGNOSIS — B351 Tinea unguium: Secondary | ICD-10-CM | POA: Diagnosis not present

## 2016-10-03 DIAGNOSIS — E1342 Other specified diabetes mellitus with diabetic polyneuropathy: Secondary | ICD-10-CM | POA: Diagnosis not present

## 2016-10-18 DIAGNOSIS — E119 Type 2 diabetes mellitus without complications: Secondary | ICD-10-CM | POA: Diagnosis not present

## 2016-10-24 DIAGNOSIS — I10 Essential (primary) hypertension: Secondary | ICD-10-CM | POA: Diagnosis not present

## 2016-10-24 DIAGNOSIS — E1140 Type 2 diabetes mellitus with diabetic neuropathy, unspecified: Secondary | ICD-10-CM | POA: Diagnosis not present

## 2016-10-25 DIAGNOSIS — Z79899 Other long term (current) drug therapy: Secondary | ICD-10-CM | POA: Diagnosis not present

## 2016-10-25 DIAGNOSIS — M0589 Other rheumatoid arthritis with rheumatoid factor of multiple sites: Secondary | ICD-10-CM | POA: Diagnosis not present

## 2016-10-27 ENCOUNTER — Other Ambulatory Visit (HOSPITAL_COMMUNITY): Payer: Self-pay | Admitting: Internal Medicine

## 2016-10-27 DIAGNOSIS — Z1231 Encounter for screening mammogram for malignant neoplasm of breast: Secondary | ICD-10-CM

## 2016-12-02 ENCOUNTER — Encounter (INDEPENDENT_AMBULATORY_CARE_PROVIDER_SITE_OTHER): Payer: Self-pay | Admitting: *Deleted

## 2016-12-06 DIAGNOSIS — E119 Type 2 diabetes mellitus without complications: Secondary | ICD-10-CM | POA: Diagnosis not present

## 2016-12-06 DIAGNOSIS — H25813 Combined forms of age-related cataract, bilateral: Secondary | ICD-10-CM | POA: Diagnosis not present

## 2016-12-06 DIAGNOSIS — H353132 Nonexudative age-related macular degeneration, bilateral, intermediate dry stage: Secondary | ICD-10-CM | POA: Diagnosis not present

## 2016-12-06 DIAGNOSIS — Z794 Long term (current) use of insulin: Secondary | ICD-10-CM | POA: Diagnosis not present

## 2016-12-06 DIAGNOSIS — M0589 Other rheumatoid arthritis with rheumatoid factor of multiple sites: Secondary | ICD-10-CM | POA: Diagnosis not present

## 2016-12-14 ENCOUNTER — Ambulatory Visit (HOSPITAL_COMMUNITY)
Admission: RE | Admit: 2016-12-14 | Discharge: 2016-12-14 | Disposition: A | Discharge status: Home / Self Care | Payer: Medicare Other | Source: Ambulatory Visit | Attending: Internal Medicine | Admitting: Internal Medicine

## 2016-12-14 DIAGNOSIS — Z1231 Encounter for screening mammogram for malignant neoplasm of breast: Secondary | ICD-10-CM | POA: Diagnosis not present

## 2016-12-21 DIAGNOSIS — H25811 Combined forms of age-related cataract, right eye: Secondary | ICD-10-CM | POA: Diagnosis not present

## 2016-12-26 DIAGNOSIS — H353134 Nonexudative age-related macular degeneration, bilateral, advanced atrophic with subfoveal involvement: Secondary | ICD-10-CM | POA: Diagnosis not present

## 2016-12-26 DIAGNOSIS — B351 Tinea unguium: Secondary | ICD-10-CM | POA: Diagnosis not present

## 2016-12-26 DIAGNOSIS — E1342 Other specified diabetes mellitus with diabetic polyneuropathy: Secondary | ICD-10-CM | POA: Diagnosis not present

## 2016-12-26 DIAGNOSIS — L851 Acquired keratosis [keratoderma] palmaris et plantaris: Secondary | ICD-10-CM | POA: Diagnosis not present

## 2016-12-27 ENCOUNTER — Other Ambulatory Visit: Payer: Self-pay | Admitting: Pulmonary Disease

## 2017-01-04 DIAGNOSIS — E1136 Type 2 diabetes mellitus with diabetic cataract: Secondary | ICD-10-CM | POA: Diagnosis not present

## 2017-01-04 DIAGNOSIS — Z885 Allergy status to narcotic agent status: Secondary | ICD-10-CM | POA: Diagnosis not present

## 2017-01-04 DIAGNOSIS — H25811 Combined forms of age-related cataract, right eye: Secondary | ICD-10-CM | POA: Diagnosis not present

## 2017-01-04 DIAGNOSIS — M069 Rheumatoid arthritis, unspecified: Secondary | ICD-10-CM | POA: Diagnosis not present

## 2017-01-04 DIAGNOSIS — J449 Chronic obstructive pulmonary disease, unspecified: Secondary | ICD-10-CM | POA: Diagnosis not present

## 2017-01-04 DIAGNOSIS — Z881 Allergy status to other antibiotic agents status: Secondary | ICD-10-CM | POA: Diagnosis not present

## 2017-01-04 DIAGNOSIS — M1990 Unspecified osteoarthritis, unspecified site: Secondary | ICD-10-CM | POA: Diagnosis not present

## 2017-01-04 DIAGNOSIS — E7800 Pure hypercholesterolemia, unspecified: Secondary | ICD-10-CM | POA: Diagnosis not present

## 2017-01-04 DIAGNOSIS — I10 Essential (primary) hypertension: Secondary | ICD-10-CM | POA: Diagnosis not present

## 2017-01-04 DIAGNOSIS — H04123 Dry eye syndrome of bilateral lacrimal glands: Secondary | ICD-10-CM | POA: Diagnosis not present

## 2017-01-04 DIAGNOSIS — Z794 Long term (current) use of insulin: Secondary | ICD-10-CM | POA: Diagnosis not present

## 2017-01-04 DIAGNOSIS — G4730 Sleep apnea, unspecified: Secondary | ICD-10-CM | POA: Diagnosis not present

## 2017-01-04 DIAGNOSIS — Z87891 Personal history of nicotine dependence: Secondary | ICD-10-CM | POA: Diagnosis not present

## 2017-01-04 DIAGNOSIS — E119 Type 2 diabetes mellitus without complications: Secondary | ICD-10-CM | POA: Diagnosis not present

## 2017-01-04 DIAGNOSIS — H353132 Nonexudative age-related macular degeneration, bilateral, intermediate dry stage: Secondary | ICD-10-CM | POA: Diagnosis not present

## 2017-01-04 DIAGNOSIS — Z7982 Long term (current) use of aspirin: Secondary | ICD-10-CM | POA: Diagnosis not present

## 2017-01-04 DIAGNOSIS — K219 Gastro-esophageal reflux disease without esophagitis: Secondary | ICD-10-CM | POA: Diagnosis not present

## 2017-01-04 DIAGNOSIS — J45909 Unspecified asthma, uncomplicated: Secondary | ICD-10-CM | POA: Diagnosis not present

## 2017-01-08 ENCOUNTER — Other Ambulatory Visit: Payer: Self-pay | Admitting: Pulmonary Disease

## 2017-01-17 DIAGNOSIS — Z23 Encounter for immunization: Secondary | ICD-10-CM | POA: Diagnosis not present

## 2017-01-17 DIAGNOSIS — Z79899 Other long term (current) drug therapy: Secondary | ICD-10-CM | POA: Diagnosis not present

## 2017-01-17 DIAGNOSIS — M0589 Other rheumatoid arthritis with rheumatoid factor of multiple sites: Secondary | ICD-10-CM | POA: Diagnosis not present

## 2017-01-20 DIAGNOSIS — E119 Type 2 diabetes mellitus without complications: Secondary | ICD-10-CM | POA: Diagnosis not present

## 2017-01-26 DIAGNOSIS — E119 Type 2 diabetes mellitus without complications: Secondary | ICD-10-CM | POA: Diagnosis not present

## 2017-01-26 DIAGNOSIS — Z23 Encounter for immunization: Secondary | ICD-10-CM | POA: Diagnosis not present

## 2017-01-26 DIAGNOSIS — J440 Chronic obstructive pulmonary disease with acute lower respiratory infection: Secondary | ICD-10-CM | POA: Diagnosis not present

## 2017-01-26 DIAGNOSIS — N760 Acute vaginitis: Secondary | ICD-10-CM | POA: Diagnosis not present

## 2017-01-27 DIAGNOSIS — E1142 Type 2 diabetes mellitus with diabetic polyneuropathy: Secondary | ICD-10-CM | POA: Diagnosis not present

## 2017-01-27 DIAGNOSIS — L97512 Non-pressure chronic ulcer of other part of right foot with fat layer exposed: Secondary | ICD-10-CM | POA: Diagnosis not present

## 2017-02-06 DIAGNOSIS — Z6841 Body Mass Index (BMI) 40.0 and over, adult: Secondary | ICD-10-CM | POA: Diagnosis not present

## 2017-02-06 DIAGNOSIS — Z79899 Other long term (current) drug therapy: Secondary | ICD-10-CM | POA: Diagnosis not present

## 2017-02-06 DIAGNOSIS — M150 Primary generalized (osteo)arthritis: Secondary | ICD-10-CM | POA: Diagnosis not present

## 2017-02-06 DIAGNOSIS — M0589 Other rheumatoid arthritis with rheumatoid factor of multiple sites: Secondary | ICD-10-CM | POA: Diagnosis not present

## 2017-02-10 DIAGNOSIS — L97511 Non-pressure chronic ulcer of other part of right foot limited to breakdown of skin: Secondary | ICD-10-CM | POA: Diagnosis not present

## 2017-02-10 DIAGNOSIS — E1142 Type 2 diabetes mellitus with diabetic polyneuropathy: Secondary | ICD-10-CM | POA: Diagnosis not present

## 2017-02-17 ENCOUNTER — Telehealth: Payer: Self-pay | Admitting: Pulmonary Disease

## 2017-02-17 DIAGNOSIS — L97511 Non-pressure chronic ulcer of other part of right foot limited to breakdown of skin: Secondary | ICD-10-CM | POA: Diagnosis not present

## 2017-02-17 DIAGNOSIS — E1142 Type 2 diabetes mellitus with diabetic polyneuropathy: Secondary | ICD-10-CM | POA: Diagnosis not present

## 2017-02-17 NOTE — Telephone Encounter (Signed)
CPAP 01/18/17 to 02/16/17 >> used on 27 of 30 nights with average 8 hrs 3 min.  Average AHI 0.8 with CPAP 10 cm H2O.   Please let her know that CPAP report shows good control of sleep apnea.

## 2017-02-22 DIAGNOSIS — H25812 Combined forms of age-related cataract, left eye: Secondary | ICD-10-CM | POA: Diagnosis not present

## 2017-02-28 DIAGNOSIS — L97511 Non-pressure chronic ulcer of other part of right foot limited to breakdown of skin: Secondary | ICD-10-CM | POA: Diagnosis not present

## 2017-02-28 DIAGNOSIS — M0589 Other rheumatoid arthritis with rheumatoid factor of multiple sites: Secondary | ICD-10-CM | POA: Diagnosis not present

## 2017-02-28 DIAGNOSIS — E1142 Type 2 diabetes mellitus with diabetic polyneuropathy: Secondary | ICD-10-CM | POA: Diagnosis not present

## 2017-03-06 DIAGNOSIS — E1342 Other specified diabetes mellitus with diabetic polyneuropathy: Secondary | ICD-10-CM | POA: Diagnosis not present

## 2017-03-06 DIAGNOSIS — L851 Acquired keratosis [keratoderma] palmaris et plantaris: Secondary | ICD-10-CM | POA: Diagnosis not present

## 2017-03-06 DIAGNOSIS — B351 Tinea unguium: Secondary | ICD-10-CM | POA: Diagnosis not present

## 2017-03-13 DIAGNOSIS — L97511 Non-pressure chronic ulcer of other part of right foot limited to breakdown of skin: Secondary | ICD-10-CM | POA: Diagnosis not present

## 2017-03-13 DIAGNOSIS — E1142 Type 2 diabetes mellitus with diabetic polyneuropathy: Secondary | ICD-10-CM | POA: Diagnosis not present

## 2017-03-27 DIAGNOSIS — L97519 Non-pressure chronic ulcer of other part of right foot with unspecified severity: Secondary | ICD-10-CM | POA: Diagnosis not present

## 2017-03-27 DIAGNOSIS — E1142 Type 2 diabetes mellitus with diabetic polyneuropathy: Secondary | ICD-10-CM | POA: Diagnosis not present

## 2017-04-12 DIAGNOSIS — Z79899 Other long term (current) drug therapy: Secondary | ICD-10-CM | POA: Diagnosis not present

## 2017-04-12 DIAGNOSIS — M0589 Other rheumatoid arthritis with rheumatoid factor of multiple sites: Secondary | ICD-10-CM | POA: Diagnosis not present

## 2017-04-16 DIAGNOSIS — J209 Acute bronchitis, unspecified: Secondary | ICD-10-CM | POA: Diagnosis not present

## 2017-04-24 DIAGNOSIS — E1142 Type 2 diabetes mellitus with diabetic polyneuropathy: Secondary | ICD-10-CM | POA: Diagnosis not present

## 2017-04-24 DIAGNOSIS — L97519 Non-pressure chronic ulcer of other part of right foot with unspecified severity: Secondary | ICD-10-CM | POA: Diagnosis not present

## 2017-04-26 ENCOUNTER — Ambulatory Visit: Payer: Medicare Other | Admitting: Pulmonary Disease

## 2017-04-26 DIAGNOSIS — Z881 Allergy status to other antibiotic agents status: Secondary | ICD-10-CM | POA: Diagnosis not present

## 2017-04-26 DIAGNOSIS — M1990 Unspecified osteoarthritis, unspecified site: Secondary | ICD-10-CM | POA: Diagnosis not present

## 2017-04-26 DIAGNOSIS — Z885 Allergy status to narcotic agent status: Secondary | ICD-10-CM | POA: Diagnosis not present

## 2017-04-26 DIAGNOSIS — H353132 Nonexudative age-related macular degeneration, bilateral, intermediate dry stage: Secondary | ICD-10-CM | POA: Diagnosis not present

## 2017-04-26 DIAGNOSIS — I10 Essential (primary) hypertension: Secondary | ICD-10-CM | POA: Diagnosis not present

## 2017-04-26 DIAGNOSIS — Z7982 Long term (current) use of aspirin: Secondary | ICD-10-CM | POA: Diagnosis not present

## 2017-04-26 DIAGNOSIS — H25812 Combined forms of age-related cataract, left eye: Secondary | ICD-10-CM | POA: Diagnosis not present

## 2017-04-26 DIAGNOSIS — Z794 Long term (current) use of insulin: Secondary | ICD-10-CM | POA: Diagnosis not present

## 2017-04-26 DIAGNOSIS — Z961 Presence of intraocular lens: Secondary | ICD-10-CM | POA: Diagnosis not present

## 2017-04-26 DIAGNOSIS — H04123 Dry eye syndrome of bilateral lacrimal glands: Secondary | ICD-10-CM | POA: Diagnosis not present

## 2017-04-26 DIAGNOSIS — Z9841 Cataract extraction status, right eye: Secondary | ICD-10-CM | POA: Diagnosis not present

## 2017-04-26 DIAGNOSIS — E7800 Pure hypercholesterolemia, unspecified: Secondary | ICD-10-CM | POA: Diagnosis not present

## 2017-04-26 DIAGNOSIS — G4730 Sleep apnea, unspecified: Secondary | ICD-10-CM | POA: Diagnosis not present

## 2017-04-26 DIAGNOSIS — Z87891 Personal history of nicotine dependence: Secondary | ICD-10-CM | POA: Diagnosis not present

## 2017-04-26 DIAGNOSIS — E1136 Type 2 diabetes mellitus with diabetic cataract: Secondary | ICD-10-CM | POA: Diagnosis not present

## 2017-04-26 DIAGNOSIS — J449 Chronic obstructive pulmonary disease, unspecified: Secondary | ICD-10-CM | POA: Diagnosis not present

## 2017-04-26 DIAGNOSIS — M069 Rheumatoid arthritis, unspecified: Secondary | ICD-10-CM | POA: Diagnosis not present

## 2017-04-26 DIAGNOSIS — E119 Type 2 diabetes mellitus without complications: Secondary | ICD-10-CM | POA: Diagnosis not present

## 2017-04-26 DIAGNOSIS — K219 Gastro-esophageal reflux disease without esophagitis: Secondary | ICD-10-CM | POA: Diagnosis not present

## 2017-05-02 DIAGNOSIS — E1140 Type 2 diabetes mellitus with diabetic neuropathy, unspecified: Secondary | ICD-10-CM | POA: Diagnosis not present

## 2017-05-09 DIAGNOSIS — E1129 Type 2 diabetes mellitus with other diabetic kidney complication: Secondary | ICD-10-CM | POA: Diagnosis not present

## 2017-05-09 DIAGNOSIS — F419 Anxiety disorder, unspecified: Secondary | ICD-10-CM | POA: Diagnosis not present

## 2017-05-09 DIAGNOSIS — Z6841 Body Mass Index (BMI) 40.0 and over, adult: Secondary | ICD-10-CM | POA: Diagnosis not present

## 2017-05-09 DIAGNOSIS — E1149 Type 2 diabetes mellitus with other diabetic neurological complication: Secondary | ICD-10-CM | POA: Diagnosis not present

## 2017-05-09 DIAGNOSIS — J440 Chronic obstructive pulmonary disease with acute lower respiratory infection: Secondary | ICD-10-CM | POA: Diagnosis not present

## 2017-05-10 ENCOUNTER — Ambulatory Visit: Payer: Medicare Other | Admitting: Pulmonary Disease

## 2017-05-15 ENCOUNTER — Telehealth: Payer: Self-pay | Admitting: Gastroenterology

## 2017-05-15 DIAGNOSIS — E1342 Other specified diabetes mellitus with diabetic polyneuropathy: Secondary | ICD-10-CM | POA: Diagnosis not present

## 2017-05-15 DIAGNOSIS — L851 Acquired keratosis [keratoderma] palmaris et plantaris: Secondary | ICD-10-CM | POA: Diagnosis not present

## 2017-05-15 DIAGNOSIS — B351 Tinea unguium: Secondary | ICD-10-CM | POA: Diagnosis not present

## 2017-05-15 NOTE — Telephone Encounter (Signed)
(  For documentation purposes - colonoscopy 11/2011 note found in OpNote section of chart - diverticulosis, <29mm cecal tubular adenoma removed)  She can come to [facility]. APP visit due to medical issues.  (Will need her eventual surveillance colonoscopy at hospital due to BMI)  - HD

## 2017-05-16 ENCOUNTER — Encounter: Payer: Self-pay | Admitting: Gastroenterology

## 2017-05-16 NOTE — Telephone Encounter (Signed)
Appointment scheduled with Dr. Loletha Carrow for 06-19-17.  Patient notified and paperwork mailed

## 2017-05-24 DIAGNOSIS — M0589 Other rheumatoid arthritis with rheumatoid factor of multiple sites: Secondary | ICD-10-CM | POA: Diagnosis not present

## 2017-05-26 ENCOUNTER — Ambulatory Visit (INDEPENDENT_AMBULATORY_CARE_PROVIDER_SITE_OTHER): Payer: Medicare Other | Admitting: Pulmonary Disease

## 2017-05-26 ENCOUNTER — Encounter: Payer: Self-pay | Admitting: Pulmonary Disease

## 2017-05-26 VITALS — BP 126/76 | HR 89 | Ht 62.0 in | Wt 265.8 lb

## 2017-05-26 DIAGNOSIS — Z9989 Dependence on other enabling machines and devices: Secondary | ICD-10-CM

## 2017-05-26 DIAGNOSIS — J449 Chronic obstructive pulmonary disease, unspecified: Secondary | ICD-10-CM | POA: Diagnosis not present

## 2017-05-26 DIAGNOSIS — Z6841 Body Mass Index (BMI) 40.0 and over, adult: Secondary | ICD-10-CM | POA: Diagnosis not present

## 2017-05-26 DIAGNOSIS — G4733 Obstructive sleep apnea (adult) (pediatric): Secondary | ICD-10-CM

## 2017-05-26 MED ORDER — FLUTICASONE-UMECLIDIN-VILANT 100-62.5-25 MCG/INH IN AEPB
1.0000 | INHALATION_SPRAY | Freq: Every day | RESPIRATORY_TRACT | 3 refills | Status: DC
Start: 2017-05-26 — End: 2018-06-05

## 2017-05-26 MED ORDER — FLUTICASONE-UMECLIDIN-VILANT 100-62.5-25 MCG/INH IN AEPB: 1.0000 | INHALATION_SPRAY | RESPIRATORY_TRACT | 0 refills | Status: AC

## 2017-05-26 NOTE — Addendum Note (Signed)
Addended by: Georjean Mode on: 05/26/2017 12:02 PM   Modules accepted: Orders

## 2017-05-26 NOTE — Patient Instructions (Signed)
Trelegy one puff daily, and rinse mouth after each use  Stop advair and spiriva while using trelegy  Will change CPAP to 8 cm H2O  Follow up in 1 year

## 2017-05-26 NOTE — Progress Notes (Signed)
[facility]  Chief Complaint  Patient presents with  . Follow-up    doing well overall with cpap machine.    Vital signs: BP 126/76 (BP Location: Left Arm, Cuff Size: Normal)   Pulse 89   Ht 5\' 2"  (1.575 m)   Wt 265 lb 12.8 oz (120.6 kg)   SpO2 95%   BMI 48.62 kg/m   History of Present Illness: [name] is a 67 y.o. female former smoker with obstructive sleep apnea and COPD.  Her grandchildren had a cold and she caught this.  She was having more cough and chest congestion.  As a result she was using her inhaler more.  This is improving.  She isn't having fever.  She was getting chest soreness from coughing.   She has to take lasix intermittently when she gets leg swelling and more short of breath.  This helps.    She uses CPAP nightly.  She has nasal mask.  She is getting dryness in her nose.  Otherwise no issue with CPAP.  Physical Exam:  General - pleasant Eyes - pupils reactive ENT - no sinus tenderness, no oral exudate, no LAN Cardiac - regular, no murmur Chest - no wheeze, rales Abd - soft, non tender Ext - no edema Skin - no rashes Neuro - normal strength Psych - normal mood  Assessment/Plan:  COPD with chronic bronchitis. - will try changing to trelegy; demonstrated how to use this - she can stop spiriva and advair if trelegy works well for her - continue prn albuterol  Obstructive sleep apnea. - she is compliant with CPAP and reports benefit - will try changing CPAP to 8 cm H2O to determine if this improves nasal dryness - discussed how to adjust humidifier for her CPAP  Obesity. - discussed importance of weight loss  Rheumatoid arthritis. - she is on remicade and MTX with Dr. Amil Amen    Patient Instructions  Trelegy one puff daily, and rinse mouth after each use  Stop advair and spiriva while using trelegy  Will change CPAP to 8 cm H2O  Follow up in 1 year    [name], MD [place] 05/26/2017, 11:50 AM Pager:  [phone]  Flow Sheet  Sleep tests: PSG 09/02/07 >> AHI 9 CPAP 04/25/17 to 05/24/17 >> used on 23 of 30 nights with average 8 hrs 17 min.  Average AHI 0.8 with CPAP 10 cm H2O.  Pulmonary tests PFT 09/18/07 >> FEV1 1.38 (61%), FEV1% 59, TLC 4.48 (95%), DLCO 80%, + BD  Past Medical History: She  has a past medical history of Asthma, Depression, Diabetes mellitus, GERD (gastroesophageal reflux disease), Hyperlipidemia, Hypertension, Morbid obesity ([place]), Obstructive sleep apnea syndrome, Osteoarthritis of right knee (04/2010), Rheumatoid arthritis(714.0), and Septic arthritis of knee, right ([place]) (04/2010).  Past Surgical History: She  has a past surgical history that includes Total knee arthroplasty (05/07/2010); Revision total knee arthroplasty (06/09/2010); Appendectomy (1967); Abdominal hysterectomy (1992); Cholecystectomy (1992); Aorto-pulmonary window repair (1992); Hernia repair (2001); Hernia repair (2007); Abdominal wall mesh  removal (08/13/2009); Foreign body removal abdominal (01/04/2010); Colonoscopy (12/08/2011); and Bone biopsy (Right, 04/19/2016).  Family History: Her family history includes COPD in her father; Cancer in her mother; Diabetes in her father and mother; Hyperlipidemia in her brother and mother; Hypertension in her brother.  Social History: She  reports that she quit smoking about 25 years ago. Her smoking use included cigarettes. She has a 50.00 pack-year smoking history. she has never  used smokeless tobacco. She reports that she does not drink alcohol or use drugs.  Medications: Allergies as of 05/26/2017      Reactions   Demerol Nausea Only   Severe   Hydromorphone Hcl Itching   All over the body.   Tetracycline Hives      Medication List        Accurate as of 05/26/17 11:50 AM. Always use your most recent med list.          ALPRAZolam 0.5 MG tablet Commonly known as:  XANAX Take 0.25-0.5 mg by mouth  3 (three) times daily as needed for sleep or anxiety.   aspirin 81 MG tablet Take 81 mg by mouth daily.   CALCIUM 600+D 600-400 MG-UNIT tablet Generic drug:  Calcium Carbonate-Vitamin D Take 1 tablet by mouth 2 (two) times daily.   empagliflozin 25 MG Tabs tablet Commonly known as:  JARDIANCE Take 25 mg by mouth daily.   etodolac 400 MG tablet Commonly known as:  LODINE Take 400 mg by mouth 2 (two) times daily.   fish oil-omega-3 fatty acids 1000 MG capsule Take 2 g by mouth every evening.   Fluticasone-Umeclidin-Vilant 100-62.5-25 MCG/INH Aepb Commonly known as:  TRELEGY ELLIPTA Inhale 1 puff into the lungs daily.   folic acid 1 MG tablet Commonly known as:  FOLVITE Take 1 mg by mouth daily.   furosemide 40 MG tablet Commonly known as:  LASIX Take 40 mg by mouth daily as needed for fluid. Fluid   gabapentin 600 MG tablet Commonly known as:  NEURONTIN Take 600 mg by mouth at bedtime.   ibuprofen 200 MG tablet Commonly known as:  ADVIL,MOTRIN Take 400-600 mg by mouth every 8 (eight) hours as needed. Pain   ICAPS MV PO Take 1 tablet by mouth 2 (two) times daily.   insulin aspart 100 UNIT/ML injection Commonly known as:  novoLOG Inject 18 Units into the skin 3 (three) times daily before meals. Inject 18 Units subcutaneously every morning, 18 Units subcutaneously at lunchtime IF NEEDED and 18 Units subcutaneously at bedtime as directed   LANTUS 100 UNIT/ML injection Generic drug:  insulin glargine Inject 80 Units into the skin 2 (two) times daily. Per patient 80 in am and 80 in pm.   losartan 100 MG tablet Commonly known as:  COZAAR Take 100 mg by mouth daily.   metFORMIN 500 MG 24 hr tablet Commonly known as:  GLUCOPHAGE-XR Take 2,000 mg by mouth daily.   methocarbamol 750 MG tablet Commonly known as:  ROBAXIN Take 750 mg by mouth every 8 (eight) hours as needed. Muscle Spasms   methotrexate 2.5 MG tablet Commonly known as:  RHEUMATREX Take 20 mg by mouth  once a week. Caution:Chemotherapy. Protect from light.   omeprazole 40 MG capsule Commonly known as:  PRILOSEC TAKE 1 CAPSULE EVERY MORNING   PROAIR HFA 108 (90 Base) MCG/ACT inhaler Generic drug:  albuterol INHALE 2 PUFFS INTO THE LUNGS EVERY 6 HOURS AS NEEDED FOR WHEEZING OR SHORTNESS OF BREATH   REMICADE IV Inject into the vein. Every 8 weeks   simvastatin 40 MG tablet Commonly known as:  ZOCOR Take 40 mg by mouth daily with supper.

## 2017-05-29 ENCOUNTER — Ambulatory Visit: Payer: Medicare Other | Admitting: Pulmonary Disease

## 2017-06-08 DIAGNOSIS — Z6841 Body Mass Index (BMI) 40.0 and over, adult: Secondary | ICD-10-CM | POA: Diagnosis not present

## 2017-06-08 DIAGNOSIS — M150 Primary generalized (osteo)arthritis: Secondary | ICD-10-CM | POA: Diagnosis not present

## 2017-06-08 DIAGNOSIS — Z79899 Other long term (current) drug therapy: Secondary | ICD-10-CM | POA: Diagnosis not present

## 2017-06-08 DIAGNOSIS — M0589 Other rheumatoid arthritis with rheumatoid factor of multiple sites: Secondary | ICD-10-CM | POA: Diagnosis not present

## 2017-06-11 DIAGNOSIS — J45901 Unspecified asthma with (acute) exacerbation: Secondary | ICD-10-CM | POA: Diagnosis not present

## 2017-06-13 ENCOUNTER — Encounter: Payer: Self-pay | Admitting: Adult Health

## 2017-06-13 ENCOUNTER — Ambulatory Visit (INDEPENDENT_AMBULATORY_CARE_PROVIDER_SITE_OTHER): Payer: Medicare Other | Admitting: Adult Health

## 2017-06-13 ENCOUNTER — Ambulatory Visit (INDEPENDENT_AMBULATORY_CARE_PROVIDER_SITE_OTHER)
Admission: RE | Admit: 2017-06-13 | Discharge: 2017-06-13 | Disposition: A | Discharge status: Home / Self Care | Payer: Medicare Other | Source: Ambulatory Visit | Attending: Adult Health | Admitting: Adult Health

## 2017-06-13 VITALS — BP 112/58 | HR 93 | Temp 98.4°F | Ht 62.5 in | Wt 258.0 lb

## 2017-06-13 DIAGNOSIS — J181 Lobar pneumonia, unspecified organism: Secondary | ICD-10-CM

## 2017-06-13 DIAGNOSIS — M069 Rheumatoid arthritis, unspecified: Secondary | ICD-10-CM

## 2017-06-13 DIAGNOSIS — J439 Emphysema, unspecified: Secondary | ICD-10-CM

## 2017-06-13 DIAGNOSIS — Z794 Long term (current) use of insulin: Secondary | ICD-10-CM

## 2017-06-13 DIAGNOSIS — R05 Cough: Secondary | ICD-10-CM | POA: Diagnosis not present

## 2017-06-13 DIAGNOSIS — E119 Type 2 diabetes mellitus without complications: Secondary | ICD-10-CM | POA: Diagnosis not present

## 2017-06-13 MED ORDER — PREDNISONE 10 MG PO TABS: ORAL_TABLET | ORAL | 0 refills | Status: DC

## 2017-06-13 MED ORDER — LEVALBUTEROL HCL 0.63 MG/3ML IN NEBU
0.6300 mg | INHALATION_SOLUTION | Freq: Once | RESPIRATORY_TRACT | Status: AC
  Administered 2017-06-13: 0.63 mg via RESPIRATORY_TRACT

## 2017-06-13 MED ORDER — ALBUTEROL SULFATE (2.5 MG/3ML) 0.083% IN NEBU: 2.5000 mg | INHALATION_SOLUTION | Freq: Four times a day (QID) | RESPIRATORY_TRACT | 5 refills | Status: DC | PRN

## 2017-06-13 NOTE — Progress Notes (Signed)
_0  ID: [name], female    DOB: January 15, 1951, 67 y.o.   MRN: [id number]  Chief Complaint  Patient presents with  . Acute Visit    PNA     Referring provider: Asencion Noble, MD  HPI: 67 yo female former smoker with COPD and OSA on CPAP At bedtime   RA on MTX and Remicade   TESTS: PSG 09/02/07 >> AHI 9 PFT 09/18/07 >> FEV1 1.38 (61%), FEV1% 59, TLC 4.48 (95%), DLCO 80%, + BD  CPAP 11/28/14 to 12/27/14 >> used on 30 of 30 nights with average 8 hrs and 6 min. Average AHI is 0.7 with CPAP 14 m H2O.  06/13/2017 Acute OV : COPD , PNA  Patient presents for an acute office visit.  Patient says she was seen at the urgent care 2 days ago and diagnosed with pneumonia.  She was started on doxycycline.  Patient says last week she was seen by her primary care physician for recurrent cough.  And started on a prednisone taper.  She is currently on 5 mg.  Patient says she is slightly better but continues to have cough congestion and wheezing.  Patient was told she had a left lower lobe infiltrate on chest x-ray.  We repeated her chest x-ray today which does not show any evidence of a pneumonia.  Patient says that she had a recurrent cough over the last 3 months.  She initially was treated for bronchitis in December with a Z-Pak.  Patient says her symptoms did improve.  She did have a residual cough which is been somewhat chronic over the years.  In January her cough increased and she was treated for possibly allergy component.  Cough seemed to wax and wane over the last 6 weeks.  And over the last week her cough increased with thick mucus wheezing and shortness of breath.  She had no fever.  She did have a flu swab at  urgent care that was negative. Patient denies any hemoptysis, chest pain, weight loss, abdominal pain, calf pain or increased edema   Patient has rheumatoid arthritis is on methotrexate and Remicade.  Says that joint pain is been under good control with no swelling.  Methotrexate has  recently been on hold last couple days due to her recurrent bronchitis per rheumatology  Allergies  Allergen Reactions  . Demerol Nausea Only    Severe  . Hydromorphone Hcl Itching    All over the body.  . Tetracycline Hives    Immunization History  Administered Date(s) Administered  . Influenza Split 01/10/2012, 01/05/2013, 01/09/2017  . Influenza Whole 01/31/2009, 01/09/2010, 01/09/2011  . Influenza,inj,Quad PF,6+ Mos 12/10/2013, 12/23/2015  . Influenza-Unspecified 12/26/2014  . Pneumococcal Conjugate-13 11/10/2015  . Pneumococcal Polysaccharide-23 02/17/2008, 02/23/2017    Past Medical History:  Diagnosis Date  . Asthma   . Depression   . Diabetes mellitus   . GERD (gastroesophageal reflux disease)   . Hyperlipidemia   . Hypertension   . Morbid obesity ([place])   . Obstructive sleep apnea syndrome   . Osteoarthritis of right knee 04/2010   end-stage  . Rheumatoid arthritis(714.0)    on methotrexate therapy  . Septic arthritis of knee, right ([place]) 04/2010    Tobacco History: Social History   Tobacco Use  Smoking Status Former Smoker  . Packs/day: 2.00  . Years: 25.00  . Pack years: 50.00  . Types: Cigarettes  . Last attempt to quit: 04/11/1992  . Years since quitting: 25.1  Smokeless Tobacco Never  Used   Counseling given: Not Answered   Outpatient Encounter Medications as of 06/13/2017  Medication Sig  . ALPRAZolam (XANAX) 0.5 MG tablet Take 0.25-0.5 mg by mouth 3 (three) times daily as needed for sleep or anxiety.  Marland Kitchen aspirin 81 MG tablet Take 81 mg by mouth daily.  . benzonatate (TESSALON) 200 MG capsule Take 200 mg by mouth 3 (three) times daily.  . Calcium Carbonate-Vitamin D (CALCIUM 600+D) 600-400 MG-UNIT per tablet Take 1 tablet by mouth 2 (two) times daily.    Marland Kitchen doxycycline (VIBRAMYCIN) 100 MG capsule Take 100 mg by mouth 2 (two) times daily.  . empagliflozin (JARDIANCE) 25 MG TABS tablet Take 25 mg by mouth daily.  Marland Kitchen etodolac (LODINE) 400 MG tablet  Take 400 mg by mouth 2 (two) times daily.  . fish oil-omega-3 fatty acids 1000 MG capsule Take 2 g by mouth every evening.   . Fluticasone-Umeclidin-Vilant (TRELEGY ELLIPTA) 100-62.5-25 MCG/INH AEPB Inhale 1 puff into the lungs daily.  . folic acid (FOLVITE) 1 MG tablet Take 1 mg by mouth daily.  . furosemide (LASIX) 40 MG tablet Take 40 mg by mouth daily as needed for fluid. Fluid  . gabapentin (NEURONTIN) 600 MG tablet Take 600 mg by mouth at bedtime.  Marland Kitchen ibuprofen (ADVIL,MOTRIN) 200 MG tablet Take 400-600 mg by mouth every 8 (eight) hours as needed. Pain  . InFLIXimab (REMICADE IV) Inject into the vein. Every 8 weeks  . insulin aspart (NOVOLOG) 100 UNIT/ML injection Inject 18 Units into the skin 3 (three) times daily before meals. Inject 18 Units subcutaneously every morning, 18 Units subcutaneously at lunchtime IF NEEDED and 18 Units subcutaneously at bedtime as directed  . insulin glargine (LANTUS) 100 UNIT/ML injection Inject 80 Units into the skin 2 (two) times daily. Per patient 80 in am and 80 in pm.  . losartan (COZAAR) 100 MG tablet Take 100 mg by mouth daily.  . metFORMIN (GLUCOPHAGE-XR) 500 MG 24 hr tablet Take 2,000 mg by mouth daily.    . methocarbamol (ROBAXIN) 750 MG tablet Take 750 mg by mouth every 8 (eight) hours as needed. Muscle Spasms  . methotrexate (RHEUMATREX) 2.5 MG tablet Take 20 mg by mouth once a week. Caution:Chemotherapy. Protect from light.  . Multiple Vitamins-Minerals (ICAPS MV PO) Take 1 tablet by mouth 2 (two) times daily.  Marland Kitchen omeprazole (PRILOSEC) 40 MG capsule TAKE 1 CAPSULE EVERY MORNING  . PROAIR HFA 108 (90 BASE) MCG/ACT inhaler INHALE 2 PUFFS INTO THE LUNGS EVERY 6 HOURS AS NEEDED FOR WHEEZING OR SHORTNESS OF BREATH  . simvastatin (ZOCOR) 40 MG tablet Take 40 mg by mouth daily with supper.   . predniSONE (DELTASONE) 10 MG tablet 4 tabs for 2 days, then 3 tabs for 2 days, 2 tabs for 2 days, then 1 tab for 2 days, then stop   No facility-administered  encounter medications on file as of 06/13/2017.      Review of Systems  Constitutional:   No  weight loss, night sweats,  Fevers, chills, fatigue, or  lassitude.  HEENT:   No headaches,  Difficulty swallowing,  Tooth/dental problems, or  Sore throat,                No sneezing, itching, ear ache,  Mild nasal congestion, post nasal drip,   CV:  No chest pain,  Orthopnea, PND, swelling in lower extremities, anasarca, dizziness, palpitations, syncope.   GI  No heartburn, indigestion, abdominal pain, nausea, vomiting, diarrhea, change in bowel habits, loss of  appetite, bloody stools.   Resp:    No chest wall deformity  Skin: no rash or lesions.  GU: no dysuria, change in color of urine, no urgency or frequency.  No flank pain, no hematuria   MS:  No joint pain or swelling.  No decreased range of motion.  No back pain.    Physical Exam  BP (!) 112/58 (BP Location: Left Arm, Cuff Size: Normal)   Pulse 93   Temp 98.4 F (36.9 C) (Oral)   Ht 5' 2.5" (1.588 m)   Wt 258 lb (117 kg)   SpO2 94%   BMI 46.44 kg/m   GEN: A/Ox3; pleasant , NAD, obese    HEENT:  [place]/AT,  EACs-clear, TMs-wnl, NOSE-clear drainage  THROAT-clear, no lesions, no postnasal drip or exudate noted.   NECK:  Supple w/ fair ROM; no JVD; normal carotid impulses w/o bruits; no thyromegaly or nodules palpated; no lymphadenopathy.    RESP few trace rhonchi and expiratory wheezes . no accessory muscle use, no dullness to percussion  CARD:  RRR, no m/r/g, no peripheral edema, pulses intact, no cyanosis or clubbing.  GI:   Soft & nt; nml bowel sounds; no organomegaly or masses detected.   Musco: Warm bil, no deformities or joint swelling noted.   Neuro: alert, no focal deficits noted.    Skin: Warm, no lesions or rashes    Lab Results:  CBC    Component Value Date/Time   WBC 5.9 09/20/2016 0801   RBC 4.98 09/20/2016 0801   HGB 12.9 09/20/2016 0801   HCT 40.9 09/20/2016 0801   PLT 178 09/20/2016 0801    MCV 82.1 09/20/2016 0801   MCH 25.9 (L) 09/20/2016 0801   MCHC 31.5 09/20/2016 0801   RDW 17.9 (H) 09/20/2016 0801   LYMPHSABS 1.8 09/17/2016 0454   MONOABS 0.9 09/17/2016 0454   EOSABS 0.0 09/17/2016 0454   BASOSABS 0.0 09/17/2016 0454    BMET    Component Value Date/Time   NA 139 09/20/2016 0801   K 3.5 09/20/2016 0801   CL 107 09/20/2016 0801   CO2 23 09/20/2016 0801   GLUCOSE 141 (H) 09/20/2016 0801   BUN 11 09/20/2016 0801   CREATININE 0.76 09/20/2016 0801   CREATININE 0.84 05/10/2016 1347   CALCIUM 8.5 (L) 09/20/2016 0801   GFRNONAA >60 09/20/2016 0801   GFRAA >60 09/20/2016 0801    BNP No results found for: BNP  ProBNP    Component Value Date/Time   PROBNP 40.5 02/15/2008 1205    Imaging: Dg Chest 2 View  Result Date: 06/13/2017 CLINICAL DATA:  Cough, dyspnea. EXAM: CHEST  2 VIEW COMPARISON:  Radiographs of April 05, 2016. FINDINGS: The heart size and mediastinal contours are within normal limits. No pneumothorax or pleural effusion is noted. Stable bibasilar scarring is noted. No acute pulmonary disease is noted. The visualized skeletal structures are unremarkable. IMPRESSION: No active cardiopulmonary disease. Electronically Signed   By: Marijo Conception, M.D.   On: 06/13/2017 12:10     Assessment & Plan:   COPD (chronic obstructive pulmonary disease) with emphysema (HCC) Slow to resolve COPD exacerbation with asthmatic component.  Possible recent pneumonia however chest x-ray today does not show any acute infiltrates.  Patient will finish antibiotics.  We will give her a prednisone taper over the next week.  Patient does have underlying rheumatoid arthritis and is on methotrexate and Remicade.  If cough does not improve or returns may need to consider a CT chest for  further evaluation.  She is currently off of methotrexate and will continue off her nail and reevaluate on return.  She is to follow with rheumatology as planned  Plan  Patient Instructions    Finish Doxycycline. Predniosne taper over next week. .  Mucinex Twice daily  As needed  Cough/congestion  Delsym 2 tsp Twice daily  As needed  Cough.  Tessalon Three times a day  As needed  Cough .  Continue on Pirlosec .daily  Add zyrtec 19m At bedtime  .  Continue on TRELEGY daily, rinse after use .  Remain off Methotrexate for now .  Follow up with Dr. SHalford Chessman In 2-3 weeks and As needed   Please contact office for sooner follow up if symptoms do not improve or worsen or seek emergency care        Insulin-requiring or dependent type II diabetes mellitus ([facility] Patient education on steroids.  Patient continue on her diabetic regimen to report any elevated blood sugars while on steroids if not improved with her current insulin regimen  [place] She is followed for rheumatoid arthritis by rheumatology and is currently on Remicade and methotrexate.  Her methotrexate is currently on hold due to recurrent bronchitis.  For now we will continue off of methotrexate if her cough/bronchitis do not improve or persist may need to consider a CT chest to further evaluate. Along with ESR .       TRexene Edison NP 06/13/2017

## 2017-06-13 NOTE — Assessment & Plan Note (Signed)
Patient education on steroids.  Patient continue on her diabetic regimen to report any elevated blood sugars while on steroids if not improved with her current insulin regimen

## 2017-06-13 NOTE — Progress Notes (Signed)
67 y/o F with PMH of OSA, COPD with Asthma component,Bronchitis, DM, and GERD.  Presents today for a sick visit follow-up on 06/11/17.  Pt had complaints of SOB at rest and with activity and wheezing on 06/11/17, she went to a Med-center in [place], where she was seen and diagnosed with pneumonia. Flu swab was negative at this visit. Stated she was started on doxycycline and tessalon pearls on 06/11/17.  States she feels tired, continues to cough up off-white and yellow sputum on occasion.  Notes this cough has been off and on for at least 4 months, she was treated for bronchitis with a zpak.  States she is not sleeping well at night for the past 2 weeks due to waking up for coughing and wheezing on average 3 to 4 times during the night. On average she has been getting about 6 hours of interrupted sleep during the night.  Denies fevers, chills, hemoptysis, chest pain or edema.  States she has been using her Trelegy as prescribed.  She has been taking albuterol inhaler 3 to 4 times a day over the past 2 weeks.  She was placed on prednisone taper last Wednesday for twelve days by PCP.  Endorses a fair appetite.  States she have seasonal allergies for which she takes OTC Claritin as needed for allergies.  On methotrexate for RA and stated it was stopped few days ago by rheumatology office.  Pt allergies states that she is allergic to tetracycline and this gives her hives.  Pt verified this, but states there are some tetracycline that she can take.  She is aware that she is on doxycycline and it is a tetracycline.  States she has been tolerating it well.  Health Maintenance: Pt is up to date and influenza, both the pneumovax and the Prevnar 13    PE Gen: Pleasant, obese, appears ill, normal affect Eent: no lesions, nasal mucosa boggy and pale   Neck: Supple, no JVD CVD: Rate and rhythm regular, trace peripheral edema to right leg only Lungs: No use of accessory muscle, expiratory wheezes throughout lungs   Neuro: A&Ox3  I/P 1. Pneumonia-resolving Chest x-ray- completed, no infiltrates seen, results explained to pt Finish full course of doxycycline Continue tessalon pearls three times daily and as needed for cough Mucinex twice daily as needed for cough and congestion Delsym 2 tsp twice daily as needed for cough  2. COPD/Bronchitis with Asthma component-exacerbation Prednisone taper over next week Continue on Prilosec daily Mucinex, Delsym, tessalon pearls as stated in #1 Remain off methotrexate for now Follow-up with Dr. Halford Chessman in 2-3 weeks and as needed Please contact office for sooner follow-up if symptoms do not improve or worsen or seek emergency care

## 2017-06-13 NOTE — Assessment & Plan Note (Signed)
She is followed for rheumatoid arthritis by rheumatology and is currently on Remicade and methotrexate.  Her methotrexate is currently on hold due to recurrent bronchitis.  For now we will continue off of methotrexate if her cough/bronchitis do not improve or persist may need to consider a CT chest to further evaluate. Along with ESR .

## 2017-06-13 NOTE — Patient Instructions (Signed)
Finish Doxycycline. Predniosne taper over next week. .  Mucinex Twice daily  As needed  Cough/congestion  Delsym 2 tsp Twice daily  As needed  Cough.  Tessalon Three times a day  As needed  Cough .  Continue on Pirlosec .daily  Add zyrtec 10mg  At bedtime  .  Continue on TRELEGY daily, rinse after use .  Remain off Methotrexate for now .  Follow up with Dr. Halford Chessman  In 2-3 weeks and As needed   Please contact office for sooner follow up if symptoms do not improve or worsen or seek emergency care

## 2017-06-13 NOTE — Progress Notes (Signed)
Reviewed and agree with assessment/plan.   Linton Stolp, MD [facility] 04/06/2016, 12:24 PM Pager:  [phone]  

## 2017-06-13 NOTE — Assessment & Plan Note (Signed)
Slow to resolve COPD exacerbation with asthmatic component.  Possible recent pneumonia however chest x-ray today does not show any acute infiltrates.  Patient will finish antibiotics.  We will give her a prednisone taper over the next week.  Patient does have underlying rheumatoid arthritis and is on methotrexate and Remicade.  If cough does not improve or returns may need to consider a CT chest for further evaluation.  She is currently off of methotrexate and will continue off her nail and reevaluate on return.  She is to follow with rheumatology as planned  Plan  Patient Instructions  Finish Doxycycline. Predniosne taper over next week. .  Mucinex Twice daily  As needed  Cough/congestion  Delsym 2 tsp Twice daily  As needed  Cough.  Tessalon Three times a day  As needed  Cough .  Continue on Pirlosec .daily  Add zyrtec 10mg  At bedtime  .  Continue on TRELEGY daily, rinse after use .  Remain off Methotrexate for now .  Follow up with Dr. Halford Chessman  In 2-3 weeks and As needed   Please contact office for sooner follow up if symptoms do not improve or worsen or seek emergency care

## 2017-06-19 ENCOUNTER — Ambulatory Visit: Payer: Medicare Other | Admitting: Gastroenterology

## 2017-06-25 ENCOUNTER — Other Ambulatory Visit: Payer: Self-pay | Admitting: Pulmonary Disease

## 2017-06-26 DIAGNOSIS — E11621 Type 2 diabetes mellitus with foot ulcer: Secondary | ICD-10-CM | POA: Diagnosis not present

## 2017-06-26 DIAGNOSIS — E1142 Type 2 diabetes mellitus with diabetic polyneuropathy: Secondary | ICD-10-CM | POA: Diagnosis not present

## 2017-06-27 ENCOUNTER — Encounter: Payer: Self-pay | Admitting: Adult Health

## 2017-06-27 ENCOUNTER — Ambulatory Visit (INDEPENDENT_AMBULATORY_CARE_PROVIDER_SITE_OTHER): Payer: Medicare Other | Admitting: Adult Health

## 2017-06-27 ENCOUNTER — Telehealth: Payer: Self-pay | Admitting: Pulmonary Disease

## 2017-06-27 DIAGNOSIS — J439 Emphysema, unspecified: Secondary | ICD-10-CM | POA: Diagnosis not present

## 2017-06-27 NOTE — Assessment & Plan Note (Addendum)
Recent slow to resolve exacerbation after PNA -clinically improving . Underlying RA on Remicade .  MTX is on hold ? Contributing to ongoing sx . Would cont on hold for now .  Check PFT with DLCO on return . May need HRCT chest . If DLCO has dropped.  Trigger control for cough   Plan  . Patient Instructions  Mucinex Twice daily  As needed  Cough/congestion  Delsym 2 tsp Twice daily  As needed  Cough.  Tessalon Three times a day  As needed  Cough .  Continue on Pirlosec .daily  Use Zyrtec 10mg  At bedtime  .  Continue on TRELEGY daily, rinse after use .  Remain off Methotrexate for now .  Follow up with Dr. Halford Chessman  In 6 weeks and As needed   Please contact office for sooner follow up if symptoms do not improve or worsen or seek emergency care

## 2017-06-27 NOTE — Telephone Encounter (Signed)
Spoke with patient. She was requesting her last 2 OV notes to be sent to Dr. Melissa Noon office. Advised patient that I would print the last OVs and send to his office.   She verbalized understanding. Nothing else needed at time of call.

## 2017-06-27 NOTE — Patient Instructions (Addendum)
Mucinex Twice daily  As needed  Cough/congestion  Delsym 2 tsp Twice daily  As needed  Cough.  Tessalon Three times a day  As needed  Cough .  Continue on Pirlosec .daily  Use Zyrtec 10mg  At bedtime  .  Continue on TRELEGY daily, rinse after use .  Remain off Methotrexate for now .  Follow up with Dr. Halford Chessman  In 6 weeks and As needed   Please contact office for sooner follow up if symptoms do not improve or worsen or seek emergency care   Check PFT on return

## 2017-06-27 NOTE — Progress Notes (Signed)
67 yo female former smoker with COPD and OSA on CPAP At bedtime  RA on MTX and Remicade  Presents today for 2 weeks follow-up for Pneumonia and COPD/Bronchitis with Asthma component-exacerbation. States she has completed the full course of doxycycline and prednisone. States she has been feeling much better especially over the last 4 days.  Endorses cough mostly nonproductive with occasional thick white sputum.  States, cough has improved overall, no longer yellow, but still not at baseline.  Endorses occasional wheezing at night, last episode was Friday.  Notes SOB with activities.  Denies chest pain, fever, chills hemoptysis, or SOB at rest.  Endorses great appetite.  Taking TRELEGY as prescribed.  Last used albuterol 2 days ago.    RA: Methotrexate on hold, Remicade is due on 07/05/17.  Health Maintenance: Pt is up to date and influenza, both the pneumovax and the Prevnar 13  PE Gen: Pleasant, obese, normal affect Eent: no lesions, nasal mucosa boggy and pale   Neck: Supple, no JVD CVD: Rate and rhythm regular, trace peripheral edema to bilateral lower extremities Lungs: No use of accessory muscle, scattered expiratory wheezes noted in posterior lungs  Abd: non-tender, obese, BS normal Neuro: A&Ox3  I/P 1. COPD/Bronchitis with Asthma component-exacerbation Improving Continue tessalon pearls three times daily and as needed for cough Mucinex twice daily as needed for cough and congestion Delsym 2 tsp twice daily as needed for cough Continue on TRELEGY as prescribed Continue to use albuterol as needed for SOB or wheezing Continue on Prilosec daily Continue to use Zyrtec 10 mg at bedtime Follow up with Dr. Halford Chessman  In 6 weeks and As needed   Please contact office for sooner follow up if symptoms do not improve or worsen or seek emergency care   2. PNA Resolving Continue tessalon pearls,Mucinex, and Delsym as stated in #1   3. RA Remicade is due on 07/05/17, ok to get Remicade if no  fevers and you continue to feel better Continue to hold for now, will discuss this at 6 weeks follow-up appointment with Dr. Halford Chessman

## 2017-06-27 NOTE — Progress Notes (Signed)
@Patient  ID: [name], female    DOB: 02-04-1951, 67 y.o.   MRN: [id number]  Chief Complaint  Patient presents with  . Follow-up    COPD     Referring provider: Asencion Noble, MD  HPI: 67 yo female former smoker with COPD and OSA on CPAP At bedtime  RA on MTX and Remicade   TESTS: PSG 09/02/07 >> AHI 9 PFT 09/18/07 >> FEV1 1.38 (61%), FEV1% 59, TLC 4.48 (95%), DLCO 80%, + BD  CPAP 11/28/14 to 12/27/14 >>used on 30 of 30 nights with average 8 hrs and 6 min. Average AHI is 0.7 with CPAP 14 m H2O.  06/27/2017 Follow up : PNA and COPD  Patient returns for a 2-week follow-up.  She was recently diagnosed with pneumonia at urgent care .  Treated with antibiotics, doxycycline, and a prednisone taper for COPD exacerbation. Follow up  Chest x-ray last visit showed no evidence of pneumonia.  She continued to have an ongoing cough that has been increased for the last 2 months.  She was instructed to finish her antibiotics.  And was given a prednisone taper.  Since last visit patient is feeling better with decreased cough and congestion . Wheezing is less. She is not back to baseline but improving .   Patient has rheumatoid arthritis on methotrexate and Remicade.  Joint pain and swelling have been under good control.  Her methotrexate was held due to recurrent cough and COPD exacerbations.  Allergies  Allergen Reactions  . Demerol Nausea Only    Severe  . Hydromorphone Hcl Itching    All over the body.  . Tetracycline Hives    Immunization History  Administered Date(s) Administered  . Influenza Split 01/10/2012, 01/05/2013, 01/09/2017  . Influenza Whole 01/31/2009, 01/09/2010, 01/09/2011  . Influenza,inj,Quad PF,6+ Mos 12/10/2013, 12/23/2015  . Influenza-Unspecified 12/26/2014  . Pneumococcal Conjugate-13 11/10/2015  . Pneumococcal Polysaccharide-23 02/17/2008, 02/23/2017    Past Medical History:  Diagnosis Date  . Asthma   . COPD (chronic obstructive pulmonary disease) ([place])    . Depression   . DM type 1 (diabetes mellitus, type 1) ([place])   . GERD (gastroesophageal reflux disease)   . Hyperlipidemia   . Hypertension   . Morbid obesity ([place])   . Obstructive sleep apnea syndrome   . Osteoarthritis of right knee 04/2010   end-stage  . Rheumatoid arthritis(714.0)    on methotrexate therapy  . Septic arthritis of knee, right ([place]) 04/2010    Tobacco History: Social History   Tobacco Use  Smoking Status Former Smoker  . Packs/day: 2.00  . Years: 25.00  . Pack years: 50.00  . Types: Cigarettes  . Last attempt to quit: 04/11/1992  . Years since quitting: 25.2  Smokeless Tobacco Never Used   Counseling given: Not Answered   Outpatient Encounter Medications as of 06/27/2017  Medication Sig  . albuterol (PROVENTIL) (2.5 MG/3ML) 0.083% nebulizer solution Take 3 mLs (2.5 mg total) by nebulization every 6 (six) hours as needed for wheezing or shortness of breath (dx: J43.9).  Marland Kitchen ALPRAZolam (XANAX) 0.5 MG tablet Take 0.25-0.5 mg by mouth 3 (three) times daily as needed for sleep or anxiety.  Marland Kitchen aspirin 81 MG tablet Take 81 mg by mouth daily.  . benzonatate (TESSALON) 200 MG capsule Take 200 mg by mouth 3 (three) times daily.  . Calcium Carbonate-Vitamin D (CALCIUM 600+D) 600-400 MG-UNIT per tablet Take 1 tablet by mouth 2 (two) times daily.    Marland Kitchen doxycycline (VIBRAMYCIN) 100 MG capsule  Take 100 mg by mouth 2 (two) times daily.  . empagliflozin (JARDIANCE) 25 MG TABS tablet Take 25 mg by mouth daily.  Marland Kitchen etodolac (LODINE) 400 MG tablet Take 400 mg by mouth 2 (two) times daily.  . fish oil-omega-3 fatty acids 1000 MG capsule Take 2 g by mouth every evening.   . Fluticasone-Umeclidin-Vilant (TRELEGY ELLIPTA) 100-62.5-25 MCG/INH AEPB Inhale 1 puff into the lungs daily.  . folic acid (FOLVITE) 1 MG tablet Take 1 mg by mouth daily.  . furosemide (LASIX) 40 MG tablet Take 40 mg by mouth daily as needed for fluid. Fluid  . gabapentin (NEURONTIN) 600 MG tablet Take 600 mg by  mouth at bedtime.  Marland Kitchen ibuprofen (ADVIL,MOTRIN) 200 MG tablet Take 400-600 mg by mouth every 8 (eight) hours as needed. Pain  . InFLIXimab (REMICADE IV) Inject into the vein. Every 8 weeks  . insulin aspart (NOVOLOG) 100 UNIT/ML injection Inject 18 Units into the skin 3 (three) times daily before meals. Inject 18 Units subcutaneously every morning, 18 Units subcutaneously at lunchtime IF NEEDED and 18 Units subcutaneously at bedtime as directed  . insulin glargine (LANTUS) 100 UNIT/ML injection Inject 80 Units into the skin 2 (two) times daily. Per patient 80 in am and 80 in pm.  . losartan (COZAAR) 100 MG tablet Take 100 mg by mouth daily.  . metFORMIN (GLUCOPHAGE-XR) 500 MG 24 hr tablet Take 2,000 mg by mouth daily.    . methocarbamol (ROBAXIN) 750 MG tablet Take 750 mg by mouth every 8 (eight) hours as needed. Muscle Spasms  . methotrexate (RHEUMATREX) 2.5 MG tablet Take 20 mg by mouth once a week. Caution:Chemotherapy. Protect from light.  . Multiple Vitamins-Minerals (ICAPS MV PO) Take 1 tablet by mouth 2 (two) times daily.  Marland Kitchen PROAIR HFA 108 (90 BASE) MCG/ACT inhaler INHALE 2 PUFFS INTO THE LUNGS EVERY 6 HOURS AS NEEDED FOR WHEEZING OR SHORTNESS OF BREATH  . simvastatin (ZOCOR) 40 MG tablet Take 40 mg by mouth daily with supper.   . [DISCONTINUED] omeprazole (PRILOSEC) 40 MG capsule TAKE 1 CAPSULE EVERY MORNING  . [DISCONTINUED] predniSONE (DELTASONE) 10 MG tablet 4 tabs for 2 days, then 3 tabs for 2 days, 2 tabs for 2 days, then 1 tab for 2 days, then stop (Patient not taking: Reported on 06/27/2017)   No facility-administered encounter medications on file as of 06/27/2017.      Review of Systems  Constitutional:   No  weight loss, night sweats,  Fevers, chills, fatigue, or  lassitude.  HEENT:   No headaches,  Difficulty swallowing,  Tooth/dental problems, or  Sore throat,                No sneezing, itching, ear ache,  +nasal congestion, post nasal drip,   CV:  No chest pain,   Orthopnea, PND, swelling in lower extremities, anasarca, dizziness, palpitations, syncope.   GI  No heartburn, indigestion, abdominal pain, nausea, vomiting, diarrhea, change in bowel habits, loss of appetite, bloody stools.   Resp:  No chest wall deformity  Skin: no rash or lesions.  GU: no dysuria, change in color of urine, no urgency or frequency.  No flank pain, no hematuria   MS:  No joint pain or swelling.  No decreased range of motion.  No back pain.    Physical Exam  BP (!) 112/58 (BP Location: Left Arm, Cuff Size: Large)   Pulse 91   Ht 5\' 2"  (1.575 m)   Wt 264 lb 9.6 oz (120  kg)   SpO2 94%   BMI 48.40 kg/m   GEN: A/Ox3; pleasant , NAD, elderly    HEENT:  [place]/AT,  EACs-clear, TMs-wnl, NOSE-clear, THROAT-clear, no lesions, no postnasal drip or exudate noted.   NECK:  Supple w/ fair ROM; no JVD; normal carotid impulses w/o bruits; no thyromegaly or nodules palpated; no lymphadenopathy.    RESP  Few  Trace wheeze . no accessory muscle use, no dullness to percussion  CARD:  RRR, no m/r/g, no peripheral edema, pulses intact, no cyanosis or clubbing.  GI:   Soft & nt; nml bowel sounds; no organomegaly or masses detected.   Musco: Warm bil, no deformities or joint swelling noted.   Neuro: alert, no focal deficits noted.    Skin: Warm, no lesions or rashes    Lab Results:  CBC  BNP No results found for: BNP  ProBNP  Imaging: Dg Chest 2 View  Result Date: 06/13/2017 CLINICAL DATA:  Cough, dyspnea. EXAM: CHEST  2 VIEW COMPARISON:  Radiographs of April 05, 2016. FINDINGS: The heart size and mediastinal contours are within normal limits. No pneumothorax or pleural effusion is noted. Stable bibasilar scarring is noted. No acute pulmonary disease is noted. The visualized skeletal structures are unremarkable. IMPRESSION: No active cardiopulmonary disease. Electronically Signed   By: Marijo Conception, M.D.   On: 06/13/2017 12:10     Assessment & Plan:   COPD  (chronic obstructive pulmonary disease) with emphysema ([facility]) Recent slow to resolve exacerbation after PNA -clinically improving . Underlying RA on Remicade .  MTX is on hold ? Contributing to ongoing sx . Would cont on hold for now .  Check PFT with DLCO on return . May need HRCT chest . If DLCO has dropped.  Trigger control for cough   Plan  . Patient Instructions  Mucinex Twice daily  As needed  Cough/congestion  Delsym 2 tsp Twice daily  As needed  Cough.  Tessalon Three times a day  As needed  Cough .  Continue on Pirlosec .daily  Use Zyrtec 10mg  At bedtime  .  Continue on TRELEGY daily, rinse after use .  Remain off Methotrexate for now .  Follow up with Dr. Halford Chessman  In 6 weeks and As needed   Please contact office for sooner follow up if symptoms do not improve or worsen or seek emergency care          Rexene Edison, NP 06/27/2017

## 2017-06-27 NOTE — Progress Notes (Signed)
Reviewed and agree with assessment/plan.   Camelle Henkels, MD [facility] 04/06/2016, 12:24 PM Pager:  [phone]  

## 2017-07-05 DIAGNOSIS — M0589 Other rheumatoid arthritis with rheumatoid factor of multiple sites: Secondary | ICD-10-CM | POA: Diagnosis not present

## 2017-07-05 DIAGNOSIS — Z79899 Other long term (current) drug therapy: Secondary | ICD-10-CM | POA: Diagnosis not present

## 2017-07-24 DIAGNOSIS — E1342 Other specified diabetes mellitus with diabetic polyneuropathy: Secondary | ICD-10-CM | POA: Diagnosis not present

## 2017-07-24 DIAGNOSIS — L851 Acquired keratosis [keratoderma] palmaris et plantaris: Secondary | ICD-10-CM | POA: Diagnosis not present

## 2017-07-24 DIAGNOSIS — B351 Tinea unguium: Secondary | ICD-10-CM | POA: Diagnosis not present

## 2017-07-26 DIAGNOSIS — J441 Chronic obstructive pulmonary disease with (acute) exacerbation: Secondary | ICD-10-CM | POA: Diagnosis not present

## 2017-07-28 ENCOUNTER — Encounter: Payer: Self-pay | Admitting: Pulmonary Disease

## 2017-07-28 DIAGNOSIS — Z9989 Dependence on other enabling machines and devices: Principal | ICD-10-CM

## 2017-07-28 DIAGNOSIS — G4733 Obstructive sleep apnea (adult) (pediatric): Secondary | ICD-10-CM

## 2017-07-28 NOTE — Telephone Encounter (Signed)
From: [name]    Sent: 07/28/2017 12:19 PM EDT      To: [name], MD Subject: Non-Urgent Medical Question  Could you please send an order to increase the pressure on my CPAP to 10. I am struggling to get enough air in at night. Thank you.  VS please advise. Thanks  Current settings are 8cm

## 2017-07-29 ENCOUNTER — Inpatient Hospital Stay (HOSPITAL_COMMUNITY)
Admission: EM | Admit: 2017-07-29 | Discharge: 2017-08-01 | DRG: 190 | Disposition: A | Discharge status: Home / Self Care | Payer: Medicare Other | Attending: Family Medicine | Admitting: Family Medicine

## 2017-07-29 ENCOUNTER — Other Ambulatory Visit: Payer: Self-pay

## 2017-07-29 ENCOUNTER — Encounter (HOSPITAL_COMMUNITY): Payer: Self-pay | Admitting: *Deleted

## 2017-07-29 ENCOUNTER — Emergency Department (HOSPITAL_COMMUNITY): Payer: Medicare Other

## 2017-07-29 DIAGNOSIS — E109 Type 1 diabetes mellitus without complications: Secondary | ICD-10-CM | POA: Diagnosis present

## 2017-07-29 DIAGNOSIS — E785 Hyperlipidemia, unspecified: Secondary | ICD-10-CM | POA: Diagnosis present

## 2017-07-29 DIAGNOSIS — J9621 Acute and chronic respiratory failure with hypoxia: Secondary | ICD-10-CM | POA: Diagnosis not present

## 2017-07-29 DIAGNOSIS — G4733 Obstructive sleep apnea (adult) (pediatric): Secondary | ICD-10-CM | POA: Diagnosis present

## 2017-07-29 DIAGNOSIS — Z794 Long term (current) use of insulin: Secondary | ICD-10-CM

## 2017-07-29 DIAGNOSIS — N390 Urinary tract infection, site not specified: Secondary | ICD-10-CM | POA: Diagnosis present

## 2017-07-29 DIAGNOSIS — K219 Gastro-esophageal reflux disease without esophagitis: Secondary | ICD-10-CM | POA: Diagnosis present

## 2017-07-29 DIAGNOSIS — Z6841 Body Mass Index (BMI) 40.0 and over, adult: Secondary | ICD-10-CM | POA: Diagnosis not present

## 2017-07-29 DIAGNOSIS — R0789 Other chest pain: Secondary | ICD-10-CM | POA: Diagnosis not present

## 2017-07-29 DIAGNOSIS — E119 Type 2 diabetes mellitus without complications: Secondary | ICD-10-CM | POA: Diagnosis not present

## 2017-07-29 DIAGNOSIS — R000 Tachycardia, unspecified: Secondary | ICD-10-CM | POA: Diagnosis not present

## 2017-07-29 DIAGNOSIS — J441 Chronic obstructive pulmonary disease with (acute) exacerbation: Secondary | ICD-10-CM | POA: Diagnosis not present

## 2017-07-29 DIAGNOSIS — F419 Anxiety disorder, unspecified: Secondary | ICD-10-CM | POA: Diagnosis present

## 2017-07-29 DIAGNOSIS — M069 Rheumatoid arthritis, unspecified: Secondary | ICD-10-CM | POA: Diagnosis present

## 2017-07-29 DIAGNOSIS — J449 Chronic obstructive pulmonary disease, unspecified: Secondary | ICD-10-CM

## 2017-07-29 DIAGNOSIS — I10 Essential (primary) hypertension: Secondary | ICD-10-CM | POA: Diagnosis present

## 2017-07-29 DIAGNOSIS — Z7982 Long term (current) use of aspirin: Secondary | ICD-10-CM | POA: Diagnosis not present

## 2017-07-29 DIAGNOSIS — Z96651 Presence of right artificial knee joint: Secondary | ICD-10-CM | POA: Diagnosis present

## 2017-07-29 DIAGNOSIS — Z87891 Personal history of nicotine dependence: Secondary | ICD-10-CM | POA: Diagnosis not present

## 2017-07-29 DIAGNOSIS — Z79899 Other long term (current) drug therapy: Secondary | ICD-10-CM

## 2017-07-29 DIAGNOSIS — R0602 Shortness of breath: Secondary | ICD-10-CM | POA: Diagnosis not present

## 2017-07-29 LAB — URINALYSIS, ROUTINE W REFLEX MICROSCOPIC
Bilirubin Urine: NEGATIVE
GLUCOSE, UA: 150 mg/dL — AB
HGB URINE DIPSTICK: NEGATIVE
Ketones, ur: NEGATIVE mg/dL
NITRITE: NEGATIVE
Protein, ur: NEGATIVE mg/dL
SPECIFIC GRAVITY, URINE: 1.012 (ref 1.005–1.030)
pH: 5 (ref 5.0–8.0)

## 2017-07-29 LAB — COMPREHENSIVE METABOLIC PANEL
ALT: 21 U/L (ref 14–54)
AST: 26 U/L (ref 15–41)
Albumin: 3.6 g/dL (ref 3.5–5.0)
Alkaline Phosphatase: 66 U/L (ref 38–126)
Anion gap: 14 (ref 5–15)
BUN: 31 mg/dL — ABNORMAL HIGH (ref 6–20)
CHLORIDE: 100 mmol/L — AB (ref 101–111)
CO2: 24 mmol/L (ref 22–32)
Calcium: 9 mg/dL (ref 8.9–10.3)
Creatinine, Ser: 1.09 mg/dL — ABNORMAL HIGH (ref 0.44–1.00)
GFR, EST AFRICAN AMERICAN: 60 mL/min — AB (ref >60)
GFR, EST NON AFRICAN AMERICAN: 52 mL/min — AB (ref >60)
Glucose, Bld: 96 mg/dL (ref 65–99)
POTASSIUM: 4.5 mmol/L (ref 3.5–5.1)
Sodium: 138 mmol/L (ref 135–145)
Total Bilirubin: 0.6 mg/dL (ref 0.3–1.2)
Total Protein: 8 g/dL (ref 6.5–8.1)

## 2017-07-29 LAB — CBC WITH DIFFERENTIAL/PLATELET
Basophils Absolute: 0 10*3/uL (ref 0.0–0.1)
Basophils Relative: 0 %
EOS PCT: 0 %
Eosinophils Absolute: 0 10*3/uL (ref 0.0–0.7)
HCT: 43.4 % (ref 36.0–46.0)
Hemoglobin: 13.5 g/dL (ref 12.0–15.0)
Lymphocytes Relative: 14 %
Lymphs Abs: 1.2 10*3/uL (ref 0.7–4.0)
MCH: 24 pg — AB (ref 26.0–34.0)
MCHC: 31.1 g/dL (ref 30.0–36.0)
MCV: 77.1 fL — AB (ref 78.0–100.0)
MONO ABS: 0.4 10*3/uL (ref 0.1–1.0)
Monocytes Relative: 4 %
Neutro Abs: 7.3 10*3/uL (ref 1.7–7.7)
Neutrophils Relative %: 82 %
PLATELETS: 228 10*3/uL (ref 150–400)
RBC: 5.63 MIL/uL — ABNORMAL HIGH (ref 3.87–5.11)
RDW: 17.3 % — AB (ref 11.5–15.5)
WBC: 8.9 10*3/uL (ref 4.0–10.5)

## 2017-07-29 LAB — I-STAT TROPONIN, ED: TROPONIN I, POC: 0 ng/mL (ref 0.00–0.08)

## 2017-07-29 LAB — BRAIN NATRIURETIC PEPTIDE: B NATRIURETIC PEPTIDE 5: 15.7 pg/mL (ref 0.0–100.0)

## 2017-07-29 LAB — D-DIMER, QUANTITATIVE: D-Dimer, Quant: 0.38 ug/mL-FEU (ref 0.00–0.50)

## 2017-07-29 LAB — TSH: TSH: 0.018 u[IU]/mL — AB (ref 0.350–4.500)

## 2017-07-29 LAB — TROPONIN I: Troponin I: <0.03 ng/mL (ref <0.03)

## 2017-07-29 LAB — GLUCOSE, CAPILLARY: GLUCOSE-CAPILLARY: 240 mg/dL — AB (ref 65–99)

## 2017-07-29 MED ORDER — ALPRAZOLAM 0.5 MG PO TABS: 0.5000 mg | ORAL_TABLET | Freq: Three times a day (TID) | ORAL | Status: DC | PRN

## 2017-07-29 MED ORDER — ALBUTEROL SULFATE (2.5 MG/3ML) 0.083% IN NEBU
5.0000 mg | INHALATION_SOLUTION | Freq: Once | RESPIRATORY_TRACT | Status: AC
Start: 2017-07-29 — End: 2017-07-29
  Administered 2017-07-29: 5 mg via RESPIRATORY_TRACT
  Filled 2017-07-29: qty 6

## 2017-07-29 MED ORDER — BENZONATATE 100 MG PO CAPS
200.0000 mg | ORAL_CAPSULE | Freq: Three times a day (TID) | ORAL | Status: DC
  Administered 2017-07-29 – 2017-08-01 (×8): 200 mg via ORAL
  Filled 2017-07-29 (×8): qty 2

## 2017-07-29 MED ORDER — IRBESARTAN 300 MG PO TABS
300.0000 mg | ORAL_TABLET | Freq: Every day | ORAL | Status: DC
  Administered 2017-07-30 – 2017-08-01 (×3): 300 mg via ORAL
  Filled 2017-07-29 (×3): qty 1

## 2017-07-29 MED ORDER — METFORMIN HCL ER 750 MG PO TB24
2000.0000 mg | ORAL_TABLET | Freq: Every day | ORAL | Status: DC
  Administered 2017-07-30: 2000 mg via ORAL
  Filled 2017-07-29: qty 1

## 2017-07-29 MED ORDER — INSULIN GLARGINE 100 UNIT/ML ~~LOC~~ SOLN
80.0000 [IU] | Freq: Two times a day (BID) | SUBCUTANEOUS | Status: DC
  Administered 2017-07-29 – 2017-08-01 (×6): 80 [IU] via SUBCUTANEOUS
  Filled 2017-07-29 (×7): qty 0.8

## 2017-07-29 MED ORDER — ENOXAPARIN SODIUM 60 MG/0.6ML ~~LOC~~ SOLN
60.0000 mg | Freq: Every day | SUBCUTANEOUS | Status: DC
  Administered 2017-07-29 – 2017-07-31 (×3): 60 mg via SUBCUTANEOUS
  Filled 2017-07-29 (×3): qty 0.6

## 2017-07-29 MED ORDER — ORAL CARE MOUTH RINSE
15.0000 mL | Freq: Two times a day (BID) | OROMUCOSAL | Status: DC
  Administered 2017-07-30 – 2017-08-01 (×3): 15 mL via OROMUCOSAL

## 2017-07-29 MED ORDER — METHOCARBAMOL 500 MG PO TABS
750.0000 mg | ORAL_TABLET | Freq: Three times a day (TID) | ORAL | Status: DC | PRN
  Administered 2017-07-29 – 2017-08-01 (×5): 750 mg via ORAL
  Filled 2017-07-29 (×5): qty 2

## 2017-07-29 MED ORDER — GUAIFENESIN-CODEINE 100-10 MG/5ML PO SOLN
5.0000 mL | Freq: Once | ORAL | Status: AC
  Administered 2017-07-29: 5 mL via ORAL
  Filled 2017-07-29: qty 5

## 2017-07-29 MED ORDER — UMECLIDINIUM-VILANTEROL 62.5-25 MCG/INH IN AEPB
1.0000 | INHALATION_SPRAY | Freq: Every day | RESPIRATORY_TRACT | Status: DC
  Administered 2017-07-30 – 2017-08-01 (×3): 1 via RESPIRATORY_TRACT
  Filled 2017-07-29: qty 14

## 2017-07-29 MED ORDER — METHYLPREDNISOLONE SODIUM SUCC 125 MG IJ SOLR
80.0000 mg | Freq: Three times a day (TID) | INTRAMUSCULAR | Status: DC
  Administered 2017-07-29 – 2017-08-01 (×8): 80 mg via INTRAVENOUS
  Filled 2017-07-29 (×8): qty 2

## 2017-07-29 MED ORDER — ETODOLAC 200 MG PO CAPS
400.0000 mg | ORAL_CAPSULE | Freq: Two times a day (BID) | ORAL | Status: DC
  Administered 2017-07-29 – 2017-08-01 (×6): 400 mg via ORAL
  Filled 2017-07-29 (×6): qty 2

## 2017-07-29 MED ORDER — FOLIC ACID 1 MG PO TABS
1.0000 mg | ORAL_TABLET | Freq: Every day | ORAL | Status: DC
  Administered 2017-07-30 – 2017-08-01 (×3): 1 mg via ORAL
  Filled 2017-07-29 (×2): qty 1

## 2017-07-29 MED ORDER — METHYLPREDNISOLONE SODIUM SUCC 125 MG IJ SOLR
125.0000 mg | Freq: Once | INTRAMUSCULAR | Status: AC
  Administered 2017-07-29: 125 mg via INTRAVENOUS
  Filled 2017-07-29: qty 2

## 2017-07-29 MED ORDER — PANTOPRAZOLE SODIUM 40 MG PO TBEC
40.0000 mg | DELAYED_RELEASE_TABLET | Freq: Every day | ORAL | Status: DC
  Administered 2017-07-30 – 2017-08-01 (×3): 40 mg via ORAL
  Filled 2017-07-29 (×3): qty 1

## 2017-07-29 MED ORDER — ALBUTEROL SULFATE (2.5 MG/3ML) 0.083% IN NEBU
5.0000 mg | INHALATION_SOLUTION | Freq: Once | RESPIRATORY_TRACT | Status: AC
  Administered 2017-07-29: 5 mg via RESPIRATORY_TRACT
  Filled 2017-07-29: qty 6

## 2017-07-29 MED ORDER — SIMVASTATIN 40 MG PO TABS
40.0000 mg | ORAL_TABLET | Freq: Every day | ORAL | Status: DC
  Administered 2017-07-30 – 2017-07-31 (×2): 40 mg via ORAL
  Filled 2017-07-29 (×2): qty 1

## 2017-07-29 MED ORDER — CANAGLIFLOZIN 100 MG PO TABS
100.0000 mg | ORAL_TABLET | Freq: Every day | ORAL | Status: DC
  Administered 2017-07-30: 100 mg via ORAL
  Filled 2017-07-29: qty 1

## 2017-07-29 MED ORDER — INSULIN ASPART 100 UNIT/ML ~~LOC~~ SOLN
0.0000 [IU] | Freq: Three times a day (TID) | SUBCUTANEOUS | Status: DC
  Administered 2017-07-30: 3 [IU] via SUBCUTANEOUS
  Administered 2017-07-30: 2 [IU] via SUBCUTANEOUS
  Administered 2017-07-30: 3 [IU] via SUBCUTANEOUS
  Administered 2017-07-31 (×2): 2 [IU] via SUBCUTANEOUS
  Administered 2017-07-31: 3 [IU] via SUBCUTANEOUS
  Administered 2017-08-01: 2 [IU] via SUBCUTANEOUS

## 2017-07-29 MED ORDER — ALBUTEROL SULFATE (2.5 MG/3ML) 0.083% IN NEBU
2.5000 mg | INHALATION_SOLUTION | Freq: Four times a day (QID) | RESPIRATORY_TRACT | Status: DC | PRN
  Filled 2017-07-29: qty 3

## 2017-07-29 MED ORDER — ALBUTEROL SULFATE (2.5 MG/3ML) 0.083% IN NEBU
2.5000 mg | INHALATION_SOLUTION | Freq: Four times a day (QID) | RESPIRATORY_TRACT | Status: DC
  Administered 2017-07-29 – 2017-08-01 (×11): 2.5 mg via RESPIRATORY_TRACT
  Filled 2017-07-29 (×10): qty 3

## 2017-07-29 MED ORDER — GABAPENTIN 300 MG PO CAPS
600.0000 mg | ORAL_CAPSULE | Freq: Every day | ORAL | Status: DC
  Administered 2017-07-29 – 2017-07-31 (×3): 600 mg via ORAL
  Filled 2017-07-29 (×3): qty 2

## 2017-07-29 MED ORDER — IPRATROPIUM BROMIDE 0.02 % IN SOLN
0.5000 mg | Freq: Once | RESPIRATORY_TRACT | Status: AC
  Administered 2017-07-29: 0.5 mg via RESPIRATORY_TRACT
  Filled 2017-07-29: qty 2.5

## 2017-07-29 MED ORDER — ASPIRIN EC 81 MG PO TBEC
81.0000 mg | DELAYED_RELEASE_TABLET | Freq: Every day | ORAL | Status: DC
  Administered 2017-07-30 – 2017-08-01 (×3): 81 mg via ORAL
  Filled 2017-07-29 (×3): qty 1

## 2017-07-29 MED ORDER — LORATADINE 10 MG PO TABS
10.0000 mg | ORAL_TABLET | Freq: Every day | ORAL | Status: DC
Start: 2017-07-29 — End: 2017-08-01
  Administered 2017-07-29 – 2017-08-01 (×4): 10 mg via ORAL
  Filled 2017-07-29 (×4): qty 1

## 2017-07-29 MED ORDER — INSULIN ASPART 100 UNIT/ML ~~LOC~~ SOLN
0.0000 [IU] | Freq: Every day | SUBCUTANEOUS | Status: DC
  Administered 2017-07-29: 2 [IU] via SUBCUTANEOUS
  Administered 2017-07-30: 4 [IU] via SUBCUTANEOUS
  Administered 2017-07-31: 3 [IU] via SUBCUTANEOUS

## 2017-07-29 MED ORDER — LEVOFLOXACIN IN D5W 500 MG/100ML IV SOLN
500.0000 mg | INTRAVENOUS | Status: DC
  Administered 2017-07-29 – 2017-07-31 (×3): 500 mg via INTRAVENOUS
  Filled 2017-07-29 (×4): qty 100

## 2017-07-29 NOTE — Progress Notes (Signed)
Brief note: Lovenox   Wt=119 kg, BMI =48, CrCl~62 ml/min  Rx adjusted Lovenox to 60 mg daily in pt with BMI>30  Thanks Dorrene German 07/29/2017 9:15 PM

## 2017-07-29 NOTE — ED Triage Notes (Addendum)
Pt has COPD, has been seen by PMD and started on Zpac, symptoms are increasing and sHOB is more noted. She has used her inhaler and nebulizer without relief

## 2017-07-29 NOTE — ED Provider Notes (Addendum)
[facility] Provider Note   CSN: [id number] Arrival date & time: 07/29/17  1446     History   Chief Complaint Chief Complaint  Patient presents with  . Shortness of Breath    HPI  [name] is a 66 y.o. female with a history of morbid obesity, COPD, asthma, OSA on CPAP, DM Type I, HTN, HLD, RA on Remicade who presents to the emergency department with a chief complaint of dyspnea.  The patient reports constant, worse than baseline dyspnea with associated chest tightness and intermittent productive cough with yellow sputum that began 5 days ago.  She reports that she has been feeling generally unwell for the last 4 days. Three days ago, she saw her PCP who gave her an injection of Decadron and discharged her with a 4-day prednisone steroid taper and Z-pac.  Last dose of prednisone was today.  Over the last few days, she reports that she has used her albuterol inhaler and albuterol nebulizer machine every 2-4 hours while she has been awake with minimal to no improvement.  She also endorses mild swelling to her bilateral lower legs.  She is on 40 mg of Lasix daily PRN. She took 20 mg starting 4 days ago until today when she took a full dose.   She had dyspnea with exertion at baseline, which has worsened this week.  She denies chest pain, back pain, fever, chills, rash, N/V/D, abdominal pain, weakness, or lightheadedness. No known sick contacts.   Pulmonologist: Dr. Sood. Seen last month on 06/13/16 after the patient had been diagnosed with a lobar pneumonia at UC 2 days prior. No evidence on PNA on CXR repeated on 3/5. Patient has completed a Z-pac, prednisone taper, and doxycycline since December in addition to the medications listed above. Prior to who recommended a CT chest if symptoms did not improve. UTD on influenxa, pneumovax and Prevnar 13. Remicade was d/ced in February.   She lives at home with her son and daughter-in-law. No home O2 use.     The history is provided by the patient. No language interpreter was used.    Past Medical History:  Diagnosis Date  . Asthma   . COPD (chronic obstructive pulmonary disease) (HCC)   . Depression   . DM type 1 (diabetes mellitus, type 1) (HCC)   . GERD (gastroesophageal reflux disease)   . Hyperlipidemia   . Hypertension   . Morbid obesity (HCC)   . Obstructive sleep apnea syndrome   . Osteoarthritis of right knee 04/2010   end-stage  . Rheumatoid arthritis(714.0)    on methotrexate therapy  . Septic arthritis of knee, right (HCC) 04/2010    Patient Active Problem List   Diagnosis Date Noted  . COPD exacerbation (HCC) 07/29/2017  . Tachycardia 07/29/2017  . Small bowel obstruction (HCC) 09/16/2016  . Insulin-requiring or dependent type II diabetes mellitus (HCC) 09/16/2016  . Rheumatoid arthritis (HCC) 09/16/2016  . Diabetic foot ulcer (HCC) 05/10/2016  . 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 Recurrent ventral incisional hernia 11/01/2010  . 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 OTH COMPLICATIONS DUE INTERNAL JOINT PROSTHESIS 06/09/2010  . CARDIAC MURMUR 05/29/2009  . Obstructive sleep apnea 08/21/2007  . COPD (chronic obstructive pulmonary disease) with emphysema (HCC) 08/21/2007  . DM 08/20/2007  . HYPERLIPIDEMIA 08/20/2007  . MORBID OBESITY 08/20/2007    Past Surgical History:  Procedure Laterality Date  . ABDOMINAL HYSTERECTOMY  1992  .   ABDOMINAL WALL MESH  REMOVAL  08/13/2009   with removal of retained stitches  . AORTO-PULMONARY WINDOW REPAIR  1992   for cardiac tamponade  . APPENDECTOMY  1967  . BONE BIOPSY Right 04/19/2016   Procedure: BONE BIOPSY RIGHT GREAT TOE;  Surgeon: Benjamin McKinney, DPM;  Location: [facility];  Service: Podiatry;  Laterality: Right;  . CHOLECYSTECTOMY  1992   laparoscopic  . COLONOSCOPY  12/08/2011   Procedure: COLONOSCOPY;  Surgeon: Najeeb U Rehman, MD;  Location: [facility];  Service:  Endoscopy;  Laterality: N/A;  830  . FOREIGN BODY REMOVAL ABDOMINAL  01/04/2010   stitch abscesses  . HERNIA REPAIR  2001   open LOA / VWH repair w mesh.  Dr. Jenkins  . HERNIA REPAIR  2007   open redo LOA / VWH repair w mesh.  Dr. Jenkins  . REVISION TOTAL KNEE ARTHROPLASTY  06/09/2010   I and D   . TOTAL KNEE ARTHROPLASTY  05/07/2010   right knee     OB History   None      Home Medications    Prior to Admission medications   Medication Sig Start Date End Date Taking? Authorizing Provider  albuterol (PROVENTIL) (2.5 MG/3ML) 0.083% nebulizer solution Take 3 mLs (2.5 mg total) by nebulization every 6 (six) hours as needed for wheezing or shortness of breath (dx: J43.9). 06/13/17  Yes Parrett, Tammy S, NP  ALPRAZolam (XANAX) 0.5 MG tablet Take 0.5 mg by mouth 3 (three) times daily as needed for sleep or anxiety.    Yes [provider]  aspirin 81 MG tablet Take 81 mg by mouth daily.   Yes [provider]  BENICAR 40 MG tablet Take 40 mg by mouth daily. 06/27/17  Yes [provider]  benzonatate (TESSALON) 200 MG capsule Take 200 mg by mouth 3 (three) times daily. 06/11/17  Yes [provider]  BYDUREON 2 MG PEN Inject 2 mg into the muscle once a week. 06/23/17  Yes [provider]  Calcium Carbonate-Vitamin D (CALCIUM 600+D) 600-400 MG-UNIT per tablet Take 1 tablet by mouth 2 (two) times daily.     Yes [provider]  cetirizine (ZYRTEC) 10 MG tablet Take 10 mg by mouth daily.   Yes [provider]  empagliflozin (JARDIANCE) 25 MG TABS tablet Take 25 mg by mouth daily.   Yes [provider]  etodolac (LODINE) 400 MG tablet Take 400 mg by mouth 2 (two) times daily.   Yes [provider]  fish oil-omega-3 fatty acids 1000 MG capsule Take 2 g by mouth every evening.    Yes [provider]  Fluticasone-Umeclidin-Vilant (TRELEGY ELLIPTA) 100-62.5-25 MCG/INH AEPB Inhale 1 puff into the lungs daily. 05/26/17   Yes Sood, Vineet, MD  folic acid (FOLVITE) 1 MG tablet Take 1 mg by mouth daily.   Yes [provider]  furosemide (LASIX) 40 MG tablet Take 40 mg by mouth daily as needed for fluid. Fluid   Yes [provider]  gabapentin (NEURONTIN) 600 MG tablet Take 600 mg by mouth at bedtime.   Yes [provider]  ibuprofen (ADVIL,MOTRIN) 200 MG tablet Take 400-600 mg by mouth every 8 (eight) hours as needed. Pain   Yes [provider]  InFLIXimab (REMICADE IV) Inject 1 Dose into the vein every 6 (six) weeks. Every 8 weeks    Yes [provider]  insulin aspart (NOVOLOG) 100 UNIT/ML injection Inject 10-15 Units into the skin 2 (two) times daily with   a meal.    Yes [provider]  insulin glargine (LANTUS) 100 UNIT/ML injection Inject 80 Units into the skin 2 (two) times daily.    Yes [provider]  metFORMIN (GLUCOPHAGE-XR) 500 MG 24 hr tablet Take 2,000 mg by mouth daily.     Yes [provider]  methocarbamol (ROBAXIN) 750 MG tablet Take 750 mg by mouth every 8 (eight) hours as needed. Muscle Spasms 07/20/10  Yes Michel Bickers, MD  methotrexate (RHEUMATREX) 2.5 MG tablet Take 20 mg by mouth once a week. Caution:Chemotherapy. Protect from light.   Yes [provider]  Multiple Vitamins-Minerals (ICAPS MV PO) Take 1 tablet by mouth 2 (two) times daily.   Yes [provider]  omeprazole (PRILOSEC) 40 MG capsule TAKE 1 CAPSULE EVERY MORNING 06/27/17  Yes Parrett, Tammy S, NP  PROAIR HFA 108 (90 BASE) MCG/ACT inhaler INHALE 2 PUFFS INTO THE LUNGS EVERY 6 HOURS AS NEEDED FOR WHEEZING OR SHORTNESS OF BREATH 03/10/14  Yes Clance, Armando Reichert, MD  simvastatin (ZOCOR) 40 MG tablet Take 40 mg by mouth daily with supper.    Yes [provider]  guaiFENesin (MUCINEX) 600 MG 12 hr tablet Take 1 tablet (600 mg total) by mouth 2 (two) times daily. 08/01/17   Oswald Hillock, MD  Na Sulfate-K Sulfate-Mg Sulf 17.5-3.13-1.6 GM/177ML  SOLN Take 1 kit by mouth once for 1 dose. 08/07/17 08/07/17  Doran Stabler, MD    Family History Family History  Problem Relation Age of Onset  . Cancer Mother   . Hyperlipidemia Mother   . Diabetes Mother   . Diabetes Father   . COPD Father   . Arthritis Father        RA  . Hyperlipidemia Brother   . Hypertension Brother     Social History Social History   Tobacco Use  . Smoking status: Former Smoker    Packs/day: 2.00    Years: 25.00    Pack years: 50.00    Types: Cigarettes    Last attempt to quit: 04/11/1992    Years since quitting: 25.3  . Smokeless tobacco: Never Used  Substance Use Topics  . Alcohol use: No  . Drug use: No     Allergies   Demerol; Hydromorphone hcl; and Tetracycline   Review of Systems Review of Systems  Constitutional: Positive for fatigue. Negative for activity change, chills and fever.  HENT: Negative for congestion, sinus pressure, sinus pain and sore throat.   Eyes: Negative for visual disturbance.  Respiratory: Positive for cough, chest tightness and shortness of breath.   Cardiovascular: Positive for chest pain and leg swelling.  Gastrointestinal: Negative for abdominal pain, diarrhea, nausea and vomiting.  Genitourinary: Negative for dysuria and urgency.  Musculoskeletal: Negative for back pain, neck pain and neck stiffness.  Skin: Negative for rash.  Allergic/Immunologic: Negative for immunocompromised state.  Neurological: Negative for dizziness, light-headedness and headaches.  Psychiatric/Behavioral: Negative for confusion.     Physical Exam Updated Vital Signs BP 122/64 (BP Location: Right Arm)   Pulse 80   Temp (!) 97.5 F (36.4 C) (Oral)   Resp 20   Ht 5' 2" (1.575 m)   Wt 118 kg (260 lb 1.6 oz)   SpO2 94%   BMI 47.57 kg/m   Physical Exam  Constitutional: She is oriented to person, place, and time. No distress. Nasal cannula in place.  Morbidly obese female  HENT:  Head: Normocephalic.  Right Ear:  Tympanic membrane normal.  Left Ear: Tympanic membrane normal.  Nose: Nose normal. Right sinus exhibits no maxillary sinus tenderness and no frontal sinus tenderness. Left sinus exhibits no maxillary sinus tenderness and no frontal sinus tenderness.  Mouth/Throat: Uvula is midline. No oropharyngeal exudate or posterior oropharyngeal edema. No tonsillar exudate.  Eyes: Conjunctivae are normal. No scleral icterus.  Neck: Normal range of motion. Neck supple.  Cardiovascular: Normal rate and regular rhythm. Exam reveals no gallop and no friction rub.  No murmur heard. Pulmonary/Chest: Effort normal. No accessory muscle usage. No tachypnea. No respiratory distress. She has wheezes in the right upper field, the right middle field, the right lower field, the left upper field, the left middle field and the left lower field.  Abdominal: Soft. Bowel sounds are normal. She exhibits no distension and no mass. There is no tenderness. There is no rebound and no guarding. No hernia.  Musculoskeletal: She exhibits no tenderness.       Right lower leg: She exhibits no tenderness.       Left lower leg: She exhibits no tenderness.  Minimal edema to the bilateral lower extremities.  Neurological: She is alert and oriented to person, place, and time.  Skin: Skin is warm. Capillary refill takes less than 2 seconds. No rash noted. She is not diaphoretic.  Psychiatric: Her behavior is normal.  Nursing note and vitals reviewed.  ED Treatments / Results  Labs (all labs ordered are listed, but only abnormal results are displayed) Labs Reviewed  URINE CULTURE - Abnormal; Notable for the following components:      Result Value   Culture MULTIPLE SPECIES PRESENT, SUGGEST RECOLLECTION (*)    All other components within normal limits  CBC WITH DIFFERENTIAL/PLATELET - Abnormal; Notable for the following components:   RBC 5.63 (*)    MCV 77.1 (*)    MCH 24.0 (*)    RDW 17.3 (*)    All other components within normal  limits  COMPREHENSIVE METABOLIC PANEL - Abnormal; Notable for the following components:   Chloride 100 (*)    BUN 31 (*)    Creatinine, Ser 1.09 (*)    GFR calc non Af Amer 52 (*)    GFR calc Af Amer 60 (*)    All other components within normal limits  URINALYSIS, ROUTINE W REFLEX MICROSCOPIC - Abnormal; Notable for the following components:   APPearance HAZY (*)    Glucose, UA 150 (*)    Leukocytes, UA MODERATE (*)    Bacteria, UA RARE (*)    Squamous Epithelial / LPF 0-5 (*)    All other components within normal limits  COMPREHENSIVE METABOLIC PANEL - Abnormal; Notable for the following components:   Chloride 99 (*)    Glucose, Bld 199 (*)    BUN 31 (*)    Albumin 3.3 (*)    All other components within normal limits  CBC - Abnormal; Notable for the following components:   RBC 5.56 (*)    MCV 76.3 (*)    MCH 23.2 (*)    RDW 17.4 (*)    All other components within normal limits  TSH - Abnormal; Notable for the following components:   TSH 0.018 (*)    All other components within normal limits  GLUCOSE, CAPILLARY - Abnormal; Notable for the following components:   Glucose-Capillary 240 (*)    All other components within normal limits  GLUCOSE, CAPILLARY - Abnormal; Notable for the following components:   Glucose-Capillary 151 (*)    All other components   within normal limits  GLUCOSE, CAPILLARY - Abnormal; Notable for the following components:   Glucose-Capillary 227 (*)    All other components within normal limits  GLUCOSE, CAPILLARY - Abnormal; Notable for the following components:   Glucose-Capillary 222 (*)    All other components within normal limits  GLUCOSE, CAPILLARY - Abnormal; Notable for the following components:   Glucose-Capillary 235 (*)    All other components within normal limits  GLUCOSE, CAPILLARY - Abnormal; Notable for the following components:   Glucose-Capillary 199 (*)    All other components within normal limits  GLUCOSE, CAPILLARY - Abnormal;  Notable for the following components:   Glucose-Capillary 232 (*)    All other components within normal limits  GLUCOSE, CAPILLARY - Abnormal; Notable for the following components:   Glucose-Capillary 168 (*)    All other components within normal limits  GLUCOSE, CAPILLARY - Abnormal; Notable for the following components:   Glucose-Capillary 294 (*)    All other components within normal limits  GLUCOSE, CAPILLARY - Abnormal; Notable for the following components:   Glucose-Capillary 184 (*)    All other components within normal limits  BRAIN NATRIURETIC PEPTIDE  D-DIMER, QUANTITATIVE (NOT AT [facility])  TROPONIN I  TROPONIN I  TROPONIN I  I-STAT TROPONIN, ED    EKG EKG Interpretation  Date/Time:  Saturday July 29 2017 15:42:44 EDT Ventricular Rate:  91 PR Interval:    QRS Duration: 80 QT Interval:  339 QTC Calculation: 417 R Axis:   45 Text Interpretation:  Sinus rhythm No significant change since last tracing Confirmed by Yao, David H ([id number]) on 07/29/2017 5:10:55 PM   Radiology No results found.  Procedures .Critical Care Performed by: ,  A, PA-C Authorized by: ,  A, PA-C   Critical care provider statement:    Critical care time (minutes):  35   Critical care time was exclusive of:  Separately billable procedures and treating other patients and teaching time   Critical care was necessary to treat or prevent imminent or life-threatening deterioration of the following conditions:  Respiratory failure   Critical care was time spent personally by me on the following activities:  Pulse oximetry, ordering and review of radiographic studies, ordering and review of laboratory studies, ordering and performing treatments and interventions, re-evaluation of patient's condition, examination of patient and evaluation of patient's response to treatment   (including critical care time)  Medications Ordered in ED Medications  albuterol (PROVENTIL) (2.5 MG/3ML)  0.083% nebulizer solution 5 mg (5 mg Nebulization Given 07/29/17 1636)  ipratropium (ATROVENT) nebulizer solution 0.5 mg (0.5 mg Nebulization Given 07/29/17 1636)  methylPREDNISolone sodium succinate (SOLU-MEDROL) 125 mg/2 mL injection 125 mg (125 mg Intravenous Given 07/29/17 1829)  guaiFENesin-codeine 100-10 MG/5ML solution 5 mL (5 mLs Oral Given 07/29/17 1754)  albuterol (PROVENTIL) (2.5 MG/3ML) 0.083% nebulizer solution 5 mg (5 mg Nebulization Given 07/29/17 1800)  ipratropium (ATROVENT) nebulizer solution 0.5 mg (0.5 mg Nebulization Given 07/29/17 1800)     Initial Impression / Assessment and Plan / ED Course  I have reviewed the triage vital signs and the nursing notes.  Pertinent labs & imaging results that were available during my care of the patient were reviewed by me and considered in my medical decision making (see chart for details).     66-year-old female with a history of morbid obesity, COPD, asthma, OSA on CPAP, DM Type I, HTN, HLD, RA on Remicade presenting with dyspnea, chest tightness, cough, generalized malaise for 5 days.  Patient later   endorses that she has been feeling generally unwell since December 2018 with cough and shortness of breath, but symptoms worsened 5 days ago.  She was seen by her PCP 3 days ago and started on a prednisone taper and Z-Pak.  Last dose of prednisone was today.  Symptoms have worsened despite treatment.  Patient was seen and evaluated by Dr. Darl Householder, attending physician.  Afebrile. No tachycardia on arrival, but HR increased after nebulizer treatments x2. SaO2 91-92% on RA, placed on 2L/min [place]. Ambulated on pulse. RR increased to 35-45/min and HR increased to 123-145. SaO2 sustained at 94% while ambulating and decreased to 91% when stopped and 87% when patient laid down.   Chest x-ray consistent with COPD exacerbation.  No leukocytosis on CBC.  EKG with sinus rhythm and unchanged since previous.  Solu-Medrol given in the ED.  On re-examination, the  patient continues to have inspiratory and expiratory wheezes in all fields.  BNP is normal. Doubt pneumonia, CHF exacerbation, or ACS. PE unlikely with Wells score.  Suspect acute on chronic respiratory failure with hypoxia and COPD exacerbation.  Reviewed patient's medical chart.  Dr. Halford Chessman, pulmonologist, recommended chest CT at the patient's last visit in March of her symptoms did not improve since they have been waxing and waning since December.  Discussed this information with Dr. Maudie Mercury, hospitalist, who has accepted the patient for admission.   The patient appears reasonably stabilized for admission considering the current resources, flow, and capabilities available in the ED at this time, and I doubt any other [facility] requiring further screening and/or treatment in the ED prior to admission.  Final Clinical Impressions(s) / ED Diagnoses   Final diagnoses:  COPD exacerbation ([place])  Acute on chronic respiratory failure with hypoxia [facility])    ED Discharge Orders        Ordered    guaiFENesin (MUCINEX) 600 MG 12 hr tablet  2 times daily     08/01/17 1212    predniSONE (DELTASONE) 10 MG tablet  Status:  Discontinued     08/01/17 1212    Increase activity slowly     08/01/17 1220    Diet - low sodium heart healthy     08/01/17 1220       Shellby Schlink A, PA-C 07/29/17 1954    Drenda Freeze, MD 07/29/17 2341    Joline Maxcy A, PA-C 08/07/17 1755    Drenda Freeze, MD 08/10/17 1416

## 2017-07-29 NOTE — H&P (Signed)
TRH H&P   Patient Demographics:    [name], is a 67 y.o. female  MRN: [id number]   DOB - Dec 31, 1950  Admit Date - 07/29/2017  Outpatient Primary MD for the patient is Asencion Noble, MD  Referring MD/NP/PA: Joline Maxcy  Outpatient Specialists:   Patient coming from: home  Chief Complaint  Patient presents with  . Shortness of Breath      HPI:    [name]  is a 67 y.o. female, w Dm2 , hypertension, hyperlipidemia, Gerd, OSA , RA, Copd apparently c/o dyspnea and wheezing for the past week, was worse today and therefore presented to the ED.  Pt notes cough (nonproductive).  Denies fever, chills, cp, palp, n/v, diarrhea, brbpr, dysuria, hematuria.    In Ed,  CXR IMPRESSION: 1. No acute abnormality. 2. Stable changes of COPD and chronic bronchitis.  Urinalysis Wbc 6-30, rbc 0-5 BNP 15.7 Wbc 8.9, Hgb 13.5 Plt 228   Na 138, K 4.5, Bun 31, Creatinine 1.09 Ast 26, Alt 21 Alk phos  66, T. Bili 0.6  Trop 0.00 Pt treated with solumedrol and multiple breathing treatments and continued to have dyspnea.  Pt will be admitted for Copd exacerbation and UTI.      Review of systems:    In addition to the HPI above,  No Fever-chills, No Headache, No changes with Vision or hearing, No problems swallowing food or Liquids, No Chest pain,  No Abdominal pain, No Nausea or Vommitting, Bowel movements are regular, No Blood in stool or Urine, No dysuria, No new skin rashes or bruises, No new joints pains-aches,  No new weakness, tingling, numbness in any extremity, No recent weight gain or loss, No polyuria, polydypsia or polyphagia, No significant Mental Stressors.  A full 10 point Review of Systems was done, except as stated above, all other Review of Systems were negative.   With Past History of the following :    Past Medical History:  Diagnosis Date  . Asthma   .  COPD (chronic obstructive pulmonary disease) ([place])   . Depression   . DM type 1 (diabetes mellitus, type 1) ([place])   . GERD (gastroesophageal reflux disease)   . Hyperlipidemia   . Hypertension   . Morbid obesity ([place])   . Obstructive sleep apnea syndrome   . Osteoarthritis of right knee 04/2010   end-stage  . Rheumatoid arthritis(714.0)    on methotrexate therapy  . Septic arthritis of knee, right ([place]) 04/2010      Past Surgical History:  Procedure Laterality Date  . ABDOMINAL HYSTERECTOMY  1992  . ABDOMINAL WALL MESH  REMOVAL  08/13/2009   with removal of retained stitches  . [place]   for cardiac tamponade  . APPENDECTOMY  1967  . BONE BIOPSY Right 04/19/2016   Procedure: BONE BIOPSY RIGHT GREAT TOE;  Surgeon: Caprice Beaver, DPM;  Location: [facility];  Service: Podiatry;  Laterality: Right;  . CHOLECYSTECTOMY  1992   laparoscopic  . COLONOSCOPY  12/08/2011   Procedure: COLONOSCOPY;  Surgeon: Rogene Houston, MD;  Location: [facility];  Service: Endoscopy;  Laterality: N/A;  830  . FOREIGN BODY REMOVAL ABDOMINAL  01/04/2010   stitch abscesses  . HERNIA REPAIR  2001   open LOA / [place] repair w mesh.  Dr. Arnoldo Morale  . HERNIA REPAIR  2007   open redo LOA / [place] repair w mesh.  Dr. Arnoldo Morale  . REVISION TOTAL KNEE ARTHROPLASTY  06/09/2010   I and D   . TOTAL KNEE ARTHROPLASTY  05/07/2010   right knee      Social History:     Social History   Tobacco Use  . Smoking status: Former Smoker    Packs/day: 2.00    Years: 25.00    Pack years: 50.00    Types: Cigarettes    Last attempt to quit: 04/11/1992    Years since quitting: 25.3  . Smokeless tobacco: Never Used  Substance Use Topics  . Alcohol use: No     Lives - at home  Mobility - walks by self   Family History :     Family History  Problem Relation Age of Onset  . Cancer Mother   . Hyperlipidemia Mother   . Diabetes Mother   . Diabetes Father   . COPD Father   . Arthritis Father         RA  . Hyperlipidemia Brother   . Hypertension Brother        Home Medications:   Prior to Admission medications   Medication Sig Start Date End Date Taking? Authorizing Provider  albuterol (PROVENTIL) (2.5 MG/3ML) 0.083% nebulizer solution Take 3 mLs (2.5 mg total) by nebulization every 6 (six) hours as needed for wheezing or shortness of breath (dx: J43.9). 06/13/17  Yes Parrett, Tammy S, NP  ALPRAZolam (XANAX) 0.5 MG tablet Take 0.5 mg by mouth 3 (three) times daily as needed for sleep or anxiety.    Yes [provider]  aspirin 81 MG tablet Take 81 mg by mouth daily.   Yes [provider]  BENICAR 40 MG tablet Take 40 mg by mouth daily. 06/27/17  Yes [provider]  benzonatate (TESSALON) 200 MG capsule Take 200 mg by mouth 3 (three) times daily. 06/11/17  Yes [provider]  BYDUREON 2 MG PEN Inject 2 mg into the muscle once a week. 06/23/17  Yes [provider]  Calcium Carbonate-Vitamin D (CALCIUM 600+D) 600-400 MG-UNIT per tablet Take 1 tablet by mouth 2 (two) times daily.     Yes [provider]  cetirizine (ZYRTEC) 10 MG tablet Take 10 mg by mouth daily.   Yes [provider]  empagliflozin (JARDIANCE) 25 MG TABS tablet Take 25 mg by mouth daily.   Yes [provider]  etodolac (LODINE) 400 MG tablet Take 400 mg by mouth 2 (two) times daily.   Yes [provider]  fish oil-omega-3 fatty acids 1000 MG capsule Take 2 g by mouth every evening.    Yes [provider]  Fluticasone-Umeclidin-Vilant (TRELEGY ELLIPTA) 100-62.5-25 MCG/INH AEPB Inhale 1 puff into the lungs daily. 05/26/17  Yes [name], MD  folic acid (FOLVITE) 1 MG tablet Take 1 mg by mouth daily.   Yes [provider]  furosemide (LASIX) 40 MG tablet Take 40 mg by mouth daily as needed for fluid. Fluid  Yes [provider]  gabapentin (NEURONTIN) 600 MG tablet Take 600 mg by mouth at bedtime.   Yes [provider]  ibuprofen (ADVIL,MOTRIN) 200 MG tablet Take 400-600 mg by mouth every 8 (eight) hours as needed. Pain   Yes [provider]  InFLIXimab (REMICADE IV) Inject 1 Dose into the vein every 6 (six) weeks. Every 8 weeks    Yes [provider]  insulin aspart (NOVOLOG) 100 UNIT/ML injection Inject 10-15 Units into the skin 2 (two) times daily with a meal.    Yes [provider]  insulin glargine (LANTUS) 100 UNIT/ML injection Inject 80 Units into the skin 2 (two) times daily.    Yes [provider]  metFORMIN (GLUCOPHAGE-XR) 500 MG 24 hr tablet Take 2,000 mg by mouth daily.     Yes [provider]  methocarbamol (ROBAXIN) 750 MG tablet Take 750 mg by mouth every 8 (eight) hours as needed. Muscle Spasms 07/20/10  Yes Michel Bickers, MD  methotrexate (RHEUMATREX) 2.5 MG tablet Take 20 mg by mouth once a week. Caution:Chemotherapy. Protect from light.   Yes [provider]  Multiple Vitamins-Minerals (ICAPS MV PO) Take 1 tablet by mouth 2 (two) times daily.   Yes [provider]  omeprazole (PRILOSEC) 40 MG capsule TAKE 1 CAPSULE EVERY MORNING 06/27/17  Yes Parrett, Tammy S, NP  PROAIR HFA 108 (90 BASE) MCG/ACT inhaler INHALE 2 PUFFS INTO THE LUNGS EVERY 6 HOURS AS NEEDED FOR WHEEZING OR SHORTNESS OF BREATH 03/10/14  Yes Clance, Armando Reichert, MD  simvastatin (ZOCOR) 40 MG tablet Take 40 mg by mouth daily with supper.    Yes [provider]     Allergies:     Allergies  Allergen Reactions  . Demerol Nausea Only    Severe  . Hydromorphone Hcl Itching    All over the body.  . Tetracycline Hives     Physical Exam:   Vitals  Blood pressure (!) 112/53, pulse (!) 106, temperature 98.1 F (36.7 C), temperature source Oral, resp. rate (!) 25, height 5' 2" (1.575 m), weight 118.4 kg (261 lb), SpO2 95 %.   1. General  lying in bed in NAD,    2. Normal affect and insight, Not Suicidal or Homicidal, Awake Alert, Oriented X  3.  3. No F.N deficits, ALL C.Nerves Intact, Strength 5/5 all 4 extremities, Sensation intact all 4 extremities, Plantars down going.  4. Ears and Eyes appear Normal, Conjunctivae clear, PERRLA. Moist Oral Mucosa.  5. Supple Neck, No JVD, No cervical lymphadenopathy appriciated, No Carotid Bruits.  6. Symmetrical Chest wall movement, Good air movement bilaterally,  + bilateral exp wheezing, no crackles.   7. Tachy s1, s2,  No m/g/r  8. Positive Bowel Sounds, Abdomen Soft, No tenderness, No organomegaly appriciated,No rebound -guarding or rigidity.  9.  No Cyanosis, Normal Skin Turgor, No Skin Rash or Bruise.  10. Good muscle tone,  joints appear normal , no effusions, Normal ROM.  11. No Palpable Lymph Nodes in Neck or Axillae   morbidly obese   Data Review:    CBC Recent Labs  Lab 07/29/17 1631  WBC 8.9  HGB 13.5  HCT 43.4  PLT 228  MCV 77.1*  MCH 24.0*  MCHC 31.1  RDW 17.3*  LYMPHSABS 1.2  MONOABS 0.4  EOSABS 0.0  BASOSABS 0.0   ------------------------------------------------------------------------------------------------------------------  Chemistries  Recent Labs  Lab 07/29/17 1631  NA 138  K 4.5  CL 100*  CO2 24  GLUCOSE  96  BUN 31*  CREATININE 1.09*  CALCIUM 9.0  AST 26  ALT 21  ALKPHOS 66  BILITOT 0.6   ------------------------------------------------------------------------------------------------------------------ estimated creatinine clearance is 62 mL/min (A) (by C-G formula based on SCr of 1.09 mg/dL (H)). ------------------------------------------------------------------------------------------------------------------ No results for input(s): TSH, T4TOTAL, T3FREE, THYROIDAB in the last 72 hours.  Invalid input(s): FREET3  Coagulation profile No results for input(s): INR, PROTIME in the last 168 hours. ------------------------------------------------------------------------------------------------------------------- No results for  input(s): DDIMER in the last 72 hours. -------------------------------------------------------------------------------------------------------------------  Cardiac Enzymes No results for input(s): CKMB, TROPONINI, MYOGLOBIN in the last 168 hours.  Invalid input(s): CK ------------------------------------------------------------------------------------------------------------------    Component Value Date/Time   BNP 15.7 07/29/2017 1631     ---------------------------------------------------------------------------------------------------------------  Urinalysis    Component Value Date/Time   COLORURINE YELLOW 07/29/2017 1608   APPEARANCEUR HAZY (A) 07/29/2017 1608   LABSPEC 1.012 07/29/2017 1608   PHURINE 5.0 07/29/2017 1608   GLUCOSEU 150 (A) 07/29/2017 1608   HGBUR NEGATIVE 07/29/2017 1608   BILIRUBINUR NEGATIVE 07/29/2017 1608   KETONESUR NEGATIVE 07/29/2017 1608   PROTEINUR NEGATIVE 07/29/2017 1608   UROBILINOGEN 0.2 12/31/2012 1341   NITRITE NEGATIVE 07/29/2017 1608   LEUKOCYTESUR MODERATE (A) 07/29/2017 1608    ----------------------------------------------------------------------------------------------------------------   Imaging Results:    Dg Chest 2 View  Result Date: 07/29/2017 CLINICAL DATA:  Increasing shortness of breath.  COPD. EXAM: CHEST - 2 VIEW COMPARISON:  06/13/2017. FINDINGS: Normal sized heart. Clear lungs. Stable small amount of scarring at the right lateral lung base. The lungs are hyperexpanded with mild diffuse peribronchial thickening and accentuation of the interstitial markings. Thoracic spine degenerative changes, including changes of DISH. IMPRESSION: 1. No acute abnormality. 2. Stable changes of COPD and chronic bronchitis. Electronically Signed   By: Claudie Revering M.D.   On: 07/29/2017 16:30    nsr at 91, nl axis, no st-t changes c/w ischemia   Assessment & Plan:    Principal Problem:   COPD exacerbation (HCC) Active Problems:    Insulin-requiring or dependent type II diabetes mellitus (HCC)   Rheumatoid arthritis (HCC)   Tachycardia    Copd exacerbation solumedrol 54m iv q8h Anoro 1puff qday Albuterol 1 neb q6h and q6h prn  Levaquin 5034miv qday  Tachycardia Tele Trop I q6h x3 TSH D dimer, if positive then CTA chest r/o PE  UTI Urine culture Levaquin 50065mv qday  Dm2 Cont Metformin 2000m16m qday Cont Lantus 80units [place] bid Cont Jardiance 25mg70mqday No Bydureon since not on formulary fsbs ac and qhs, ISS  RA HOLD methotrexate   Hypertension Cont Benicar 40mg 51mday  Anxiety Cont xanax prn      DVT Prophylaxis Lovenox - SCDs  AM Labs Ordered, also please review Full Orders  Family Communication: Admission, patients condition and plan of care including tests being ordered have been discussed with the patient  who indicate understanding and agree with the plan and Code Status.  Code Status FULL CODE  Likely DC to  home  Condition GUARDED   Consults called: none  Admission status: inpatient   Time spent in minutes : 45    Jani Graveln 07/29/2017 at 7:20 PM  Between 7am to 7pm - Pager - [phone] 7pm go to [email or web address] - password [email or web address]  [facility] - Office  [phone]

## 2017-07-29 NOTE — ED Notes (Signed)
Pt ambulated on pulse oximetry. Patient exhibited SOB with RR 35-45/min. Pt HR increased to 123-145bpm. SpO2 sustained at 94% while ambulating and dropped to 91% when stopped, decreased to 87% when patient laid down.

## 2017-07-29 NOTE — ED Notes (Signed)
ED TO INPATIENT HANDOFF REPORT  Name/Age/Gender [name] 67 y.o. female  Code Status Code Status History    Date Active Date Inactive Code Status Order ID Comments User Context   09/16/2016 2053 09/21/2016 1648 Full Code [id number]  OpydIlene Qua, MD ED      Home/SNF/Other Home  Chief Complaint Shortness of Breath / Bronchitis or COPD   Level of Care/Admitting Diagnosis ED Disposition    ED Disposition Condition Comment   Admit  Hospital Area: [place] [[id number]]  Level of Care: Telemetry [5]  Admit to tele based on following criteria: Monitor for Ischemic changes  Diagnosis: COPD (chronic obstructive pulmonary disease) [facility]) [[id number]]  Admitting Physician: Jani Gravel [[id number]]  Attending Physician: Jani Gravel [phone]  Estimated length of stay: past midnight tomorrow  Certification:: I certify this patient will need inpatient services for at least 2 midnights  PT Class (Do Not Modify): Inpatient [101]  PT Acc Code (Do Not Modify): Private [1]       Medical History Past Medical History:  Diagnosis Date  . Asthma   . COPD (chronic obstructive pulmonary disease) ([place])   . Depression   . DM type 1 (diabetes mellitus, type 1) ([place])   . GERD (gastroesophageal reflux disease)   . Hyperlipidemia   . Hypertension   . Morbid obesity ([place])   . Obstructive sleep apnea syndrome   . Osteoarthritis of right knee 04/2010   end-stage  . Rheumatoid arthritis(714.0)    on methotrexate therapy  . Septic arthritis of knee, right ([place]) 04/2010    Allergies Allergies  Allergen Reactions  . Demerol Nausea Only    Severe  . Hydromorphone Hcl Itching    All over the body.  . Tetracycline Hives    IV Location/Drains/Wounds Patient Lines/Drains/Airways Status   Active Line/Drains/Airways    Name:   Placement date:   Placement time:   Site:   Days:   Peripheral IV 07/29/17 Left Forearm   07/29/17    1821    Forearm   less than 1   Incision (Closed) 04/19/16  Foot Right   04/19/16    1255     466          Labs/Imaging Results for orders placed or performed during the hospital encounter of 07/29/17 (from the past 48 hour(s))  Urinalysis, Routine w reflex microscopic     Status: Abnormal   Collection Time: 07/29/17  4:08 PM  Result Value Ref Range   Color, Urine YELLOW YELLOW   APPearance HAZY (A) CLEAR   Specific Gravity, Urine 1.012 1.005 - 1.030   pH 5.0 5.0 - 8.0   Glucose, UA 150 (A) NEGATIVE mg/dL   Hgb urine dipstick NEGATIVE NEGATIVE   Bilirubin Urine NEGATIVE NEGATIVE   Ketones, ur NEGATIVE NEGATIVE mg/dL   Protein, ur NEGATIVE NEGATIVE mg/dL   Nitrite NEGATIVE NEGATIVE   Leukocytes, UA MODERATE (A) NEGATIVE   RBC / HPF 0-5 0 - 5 RBC/hpf   WBC, UA 6-30 0 - 5 WBC/hpf   Bacteria, UA RARE (A) NONE SEEN   Squamous Epithelial / LPF 0-5 (A) NONE SEEN   Mucus PRESENT    Hyaline Casts, UA PRESENT     Comment: Performed at [facility], [place] [address], [place], [place] [address]  Brain natriuretic peptide     Status: None   Collection Time: 07/29/17  4:31 PM  Result Value Ref Range   B Natriuretic Peptide 15.7 0.0 - 100.0  pg/mL    Comment: Performed at [facility], [place] [address], [place], [place] [address]  CBC with Differential     Status: Abnormal   Collection Time: 07/29/17  4:31 PM  Result Value Ref Range   WBC 8.9 4.0 - 10.5 K/uL   RBC 5.63 (H) 3.87 - 5.11 MIL/uL   Hemoglobin 13.5 12.0 - 15.0 g/dL   HCT 43.4 36.0 - 46.0 %   MCV 77.1 (L) 78.0 - 100.0 fL   MCH 24.0 (L) 26.0 - 34.0 pg   MCHC 31.1 30.0 - 36.0 g/dL   RDW 17.3 (H) 11.5 - 15.5 %   Platelets 228 150 - 400 K/uL   Neutrophils Relative % 82 %   Neutro Abs 7.3 1.7 - 7.7 K/uL   Lymphocytes Relative 14 %   Lymphs Abs 1.2 0.7 - 4.0 K/uL   Monocytes Relative 4 %   Monocytes Absolute 0.4 0.1 - 1.0 K/uL   Eosinophils Relative 0 %   Eosinophils Absolute 0.0 0.0 - 0.7 K/uL   Basophils Relative 0 %   Basophils Absolute 0.0 0.0 -  0.1 K/uL    Comment: Performed at [facility], [place] [address], [place], [place] [address]  Comprehensive metabolic panel     Status: Abnormal   Collection Time: 07/29/17  4:31 PM  Result Value Ref Range   Sodium 138 135 - 145 mmol/L   Potassium 4.5 3.5 - 5.1 mmol/L   Chloride 100 (L) 101 - 111 mmol/L   CO2 24 22 - 32 mmol/L   Glucose, Bld 96 65 - 99 mg/dL   BUN 31 (H) 6 - 20 mg/dL   Creatinine, Ser 1.09 (H) 0.44 - 1.00 mg/dL   Calcium 9.0 8.9 - 10.3 mg/dL   Total Protein 8.0 6.5 - 8.1 g/dL   Albumin 3.6 3.5 - 5.0 g/dL   AST 26 15 - 41 U/L   ALT 21 14 - 54 U/L   Alkaline Phosphatase 66 38 - 126 U/L   Total Bilirubin 0.6 0.3 - 1.2 mg/dL   GFR calc non Af Amer 52 (L) >60 mL/min   GFR calc Af Amer 60 (L) >60 mL/min    Comment: (NOTE) The eGFR has been calculated using the CKD EPI equation. This calculation has not been validated in all clinical situations. eGFR's persistently <60 mL/min signify possible Chronic Kidney Disease.    Anion gap 14 5 - 15    Comment: Performed at [facility], [place] [address], [place], [place] [address]  I-Stat Troponin, ED (not at [facility])     Status: None   Collection Time: 07/29/17  4:39 PM  Result Value Ref Range   Troponin i, poc 0.00 0.00 - 0.08 ng/mL   Comment 3            Comment: Due to the release kinetics of cTnI, a negative result within the first hours of the onset of symptoms does not rule out myocardial infarction with certainty. If myocardial infarction is still suspected, repeat the test at appropriate intervals.    Dg Chest 2 View  Result Date: 07/29/2017 CLINICAL DATA:  Increasing shortness of breath.  COPD. EXAM: CHEST - 2 VIEW COMPARISON:  06/13/2017. FINDINGS: Normal sized heart. Clear lungs. Stable small amount of scarring at the right lateral lung base. The lungs are hyperexpanded with mild diffuse peribronchial thickening and accentuation of the interstitial markings. Thoracic spine  degenerative changes, including changes of DISH. IMPRESSION: 1. No acute abnormality.  2. Stable changes of COPD and chronic bronchitis. Electronically Signed   By: Claudie Revering M.D.   On: 07/29/2017 16:30    Pending Labs Unresulted Labs (From admission, onward)   Start     Ordered   07/29/17 1924  D-dimer, quantitative (not at [facility])  Add-on,   R     07/29/17 1923   07/29/17 1924  Troponin I (q 6hr x 3)  Now then every 6 hours,   R     07/29/17 1923      Vitals/Pain Today's Vitals   07/29/17 1730 07/29/17 1800 07/29/17 1835 07/29/17 1923  BP: (!) 112/53 (!) 112/53  (!) 119/59  Pulse: 95 (!) 104 (!) 106 (!) 103  Resp: (!) 24 14 (!) 25 19  Temp:      TempSrc:      SpO2: 92% 92% 95% 93%  Weight:      Height:      PainSc:        Isolation Precautions No active isolations  Medications Medications  albuterol (PROVENTIL) (2.5 MG/3ML) 0.083% nebulizer solution 5 mg (5 mg Nebulization Given 07/29/17 1636)  ipratropium (ATROVENT) nebulizer solution 0.5 mg (0.5 mg Nebulization Given 07/29/17 1636)  methylPREDNISolone sodium succinate (SOLU-MEDROL) 125 mg/2 mL injection 125 mg (125 mg Intravenous Given 07/29/17 1829)  guaiFENesin-codeine 100-10 MG/5ML solution 5 mL (5 mLs Oral Given 07/29/17 1754)  albuterol (PROVENTIL) (2.5 MG/3ML) 0.083% nebulizer solution 5 mg (5 mg Nebulization Given 07/29/17 1800)  ipratropium (ATROVENT) nebulizer solution 0.5 mg (0.5 mg Nebulization Given 07/29/17 1800)    Mobility walks

## 2017-07-29 NOTE — ED Notes (Signed)
Bed: WA08 Expected date:  Expected time:  Means of arrival:  Comments: 

## 2017-07-30 DIAGNOSIS — Z794 Long term (current) use of insulin: Secondary | ICD-10-CM

## 2017-07-30 DIAGNOSIS — E119 Type 2 diabetes mellitus without complications: Secondary | ICD-10-CM

## 2017-07-30 DIAGNOSIS — M069 Rheumatoid arthritis, unspecified: Secondary | ICD-10-CM

## 2017-07-30 DIAGNOSIS — J441 Chronic obstructive pulmonary disease with (acute) exacerbation: Principal | ICD-10-CM

## 2017-07-30 DIAGNOSIS — J9621 Acute and chronic respiratory failure with hypoxia: Secondary | ICD-10-CM

## 2017-07-30 DIAGNOSIS — R000 Tachycardia, unspecified: Secondary | ICD-10-CM

## 2017-07-30 LAB — GLUCOSE, CAPILLARY
GLUCOSE-CAPILLARY: 235 mg/dL — AB (ref 65–99)
Glucose-Capillary: 151 mg/dL — ABNORMAL HIGH (ref 65–99)
Glucose-Capillary: 227 mg/dL — ABNORMAL HIGH (ref 65–99)
Glucose-Capillary: 222 mg/dL — ABNORMAL HIGH (ref 65–99)

## 2017-07-30 LAB — COMPREHENSIVE METABOLIC PANEL
ALBUMIN: 3.3 g/dL — AB (ref 3.5–5.0)
ALK PHOS: 61 U/L (ref 38–126)
ALT: 20 U/L (ref 14–54)
AST: 17 U/L (ref 15–41)
Anion gap: 15 (ref 5–15)
BUN: 31 mg/dL — AB (ref 6–20)
CHLORIDE: 99 mmol/L — AB (ref 101–111)
CO2: 24 mmol/L (ref 22–32)
Calcium: 8.9 mg/dL (ref 8.9–10.3)
Creatinine, Ser: 0.92 mg/dL (ref 0.44–1.00)
GFR calc non Af Amer: >60 mL/min (ref >60)
GLUCOSE: 199 mg/dL — AB (ref 65–99)
Potassium: 4.1 mmol/L (ref 3.5–5.1)
SODIUM: 138 mmol/L (ref 135–145)
Total Bilirubin: 0.6 mg/dL (ref 0.3–1.2)
Total Protein: 7.2 g/dL (ref 6.5–8.1)

## 2017-07-30 LAB — CBC
HCT: 42.4 % (ref 36.0–46.0)
Hemoglobin: 12.9 g/dL (ref 12.0–15.0)
MCH: 23.2 pg — AB (ref 26.0–34.0)
MCHC: 30.4 g/dL (ref 30.0–36.0)
MCV: 76.3 fL — AB (ref 78.0–100.0)
PLATELETS: 213 10*3/uL (ref 150–400)
RBC: 5.56 MIL/uL — AB (ref 3.87–5.11)
RDW: 17.4 % — ABNORMAL HIGH (ref 11.5–15.5)
WBC: 5.2 10*3/uL (ref 4.0–10.5)

## 2017-07-30 LAB — TROPONIN I: Troponin I: <0.03 ng/mL (ref <0.03)

## 2017-07-30 NOTE — Progress Notes (Signed)
[facility]  PROGRESS NOTE  ANTONIETTE PEAKE WER:[id number] DOB: 23-Feb-1951 DOA: 07/29/2017 PCP: Asencion Noble, MD   Brief HPI:    67 y.o. female, w Dm2 , hypertension, hyperlipidemia, Gerd, OSA , RA, Copd apparently c/o dyspnea and wheezing for the past week, was worse today and therefore presented to the ED.  Pt notes cough (nonproductive).  Denies fever, chills, cp, palp, n/v, diarrhea, brbpr, dysuria, hematuria.     Subjective   Patient seen and examined, still coughing.   Assessment/Plan:     1. COPD exacerbation-slight improvement, continue Solu-Medrol 80 mg IV every 8 hours, Levaquin, will add duo nebs every 6 hours. 2. UTI-patient has abnormal UA, started on Levaquin.  Urine culture is pending. 3. Sinus tachycardia-d-dimer was negative, Heart rate is stable 4. Diabetes mellitus-hold metformin, Jardiance.  Continue Lantus, sliding scale insulin NovoLog. 5. Rheumatoid arthritis-hold methotrexate  6. Hypertension -blood pressure stable, continue Benicar 7. Anxiety - continue Xanax as needed  8.       DVT prophylaxis: SCDs  Code Status: Full code  Family Communication: No family at bedside  Disposition Plan: likely home when medically ready for discharge   Consultants:  None   Procedures:  None    Antibiotics:   Anti-infectives (From admission, onward)   Start     Dose/Rate Route Frequency Ordered Stop   07/29/17 2200  levofloxacin (LEVAQUIN) IVPB 500 mg     500 mg 100 mL/hr over 60 Minutes Intravenous Every 24 hours 07/29/17 2105         Objective   Vitals:   07/29/17 2106 07/30/17 0500 07/30/17 0625 07/30/17 0711  BP: 128/65  130/63   Pulse: (!) 103  81   Resp: 20  18   Temp: 98.2 F (36.8 C)  97.8 F (36.6 C)   TempSrc: Oral  Oral   SpO2: 94%  98% 99%  Weight:  117 kg (257 lb 14.4 oz)    Height:        Intake/Output Summary (Last 24 hours) at 07/30/2017 1411 Last data filed at 07/30/2017 1300 Gross per 24 hour  Intake 340 ml  Output  1400 ml  Net -1060 ml   Filed Weights   07/29/17 1520 07/29/17 2104 07/30/17 0500  Weight: 118.4 kg (261 lb) 119.3 kg (263 lb) 117 kg (257 lb 14.4 oz)     Physical Examination:  Physical Exam:  Mouth: Oral mucosa is moist, no lesions on palate,  Neck: Supple, no deformities, masses, or tenderness Lungs: Normal respiratory effort, bilateral rhonchi auscultated Heart: Regular rate and rhythm, S1 and S2 normal, no murmurs, rubs auscultated Abdomen: BS normoactive,soft,nondistended,non-tender to palpation,no organomegaly Extremities: No pretibial edema, no erythema, no cyanosis, no clubbing Neuro : Alert and oriented to time, place and person, No focal deficits     Data Reviewed: I have personally reviewed following labs and imaging studies  CBG: Recent Labs  Lab 07/29/17 2109 07/30/17 0740 07/30/17 1227  GLUCAP 240* 151* 227*    CBC: Recent Labs  Lab 07/29/17 1631 07/30/17 0346  WBC 8.9 5.2  NEUTROABS 7.3  --   HGB 13.5 12.9  HCT 43.4 42.4  MCV 77.1* 76.3*  PLT 228 195    Basic Metabolic Panel: Recent Labs  Lab 07/29/17 1631 07/30/17 0346  NA 138 138  K 4.5 4.1  CL 100* 99*  CO2 24 24  GLUCOSE 96 199*  BUN 31* 31*  CREATININE 1.09* 0.92  CALCIUM 9.0 8.9    No results found for  this or any previous visit (from the past 240 hour(s)).   Liver Function Tests: Recent Labs  Lab 07/29/17 1631 07/30/17 0346  AST 26 17  ALT 21 20  ALKPHOS 66 61  BILITOT 0.6 0.6  PROT 8.0 7.2  ALBUMIN 3.6 3.3*   No results for input(s): LIPASE, AMYLASE in the last 168 hours. No results for input(s): AMMONIA in the last 168 hours.  Cardiac Enzymes: Recent Labs  Lab 07/29/17 2125 07/30/17 0346 07/30/17 0947  TROPONINI <0.03 <0.03 <0.03   BNP (last 3 results) Recent Labs    07/29/17 1631  BNP 15.7    ProBNP (last 3 results) No results for input(s): PROBNP in the last 8760 hours.    Studies: Dg Chest 2 View  Result Date: 07/29/2017 CLINICAL DATA:   Increasing shortness of breath.  COPD. EXAM: CHEST - 2 VIEW COMPARISON:  06/13/2017. FINDINGS: Normal sized heart. Clear lungs. Stable small amount of scarring at the right lateral lung base. The lungs are hyperexpanded with mild diffuse peribronchial thickening and accentuation of the interstitial markings. Thoracic spine degenerative changes, including changes of DISH. IMPRESSION: 1. No acute abnormality. 2. Stable changes of COPD and chronic bronchitis. Electronically Signed   By: Claudie Revering M.D.   On: 07/29/2017 16:30    Scheduled Meds: . albuterol  2.5 mg Nebulization Q6H  . aspirin EC  81 mg Oral Daily  . benzonatate  200 mg Oral TID  . enoxaparin (LOVENOX) injection  60 mg Subcutaneous QHS  . etodolac  400 mg Oral BID  . folic acid  1 mg Oral Daily  . gabapentin  600 mg Oral QHS  . insulin aspart  0-5 Units Subcutaneous QHS  . insulin aspart  0-9 Units Subcutaneous TID WC  . insulin glargine  80 Units Subcutaneous BID  . irbesartan  300 mg Oral Daily  . loratadine  10 mg Oral Daily  . mouth rinse  15 mL Mouth Rinse BID  . methylPREDNISolone (SOLU-MEDROL) injection  80 mg Intravenous Q8H  . pantoprazole  40 mg Oral Daily  . simvastatin  40 mg Oral Q supper  . umeclidinium-vilanterol  1 puff Inhalation Daily      Time spent: 20 min  [facility] Pager [phone]. If 7PM-7AM, please contact night-coverage at [email or web address], Office  [phone]  password [email or web address]  07/30/2017, 2:11 PM  LOS: 1 day

## 2017-07-30 NOTE — Telephone Encounter (Signed)
Please send order to have CPAP increased to 10 cm H2O.

## 2017-07-31 LAB — GLUCOSE, CAPILLARY
GLUCOSE-CAPILLARY: 199 mg/dL — AB (ref 65–99)
GLUCOSE-CAPILLARY: 232 mg/dL — AB (ref 65–99)
GLUCOSE-CAPILLARY: 168 mg/dL — AB (ref 65–99)
Glucose-Capillary: 294 mg/dL — ABNORMAL HIGH (ref 65–99)

## 2017-07-31 LAB — URINE CULTURE

## 2017-07-31 MED ORDER — GUAIFENESIN ER 600 MG PO TB12
600.0000 mg | ORAL_TABLET | Freq: Two times a day (BID) | ORAL | Status: DC
  Administered 2017-07-31 – 2017-08-01 (×3): 600 mg via ORAL
  Filled 2017-07-31 (×3): qty 1

## 2017-07-31 MED ORDER — ACETAMINOPHEN 325 MG PO TABS
650.0000 mg | ORAL_TABLET | Freq: Four times a day (QID) | ORAL | Status: DC | PRN
  Administered 2017-07-31: 650 mg via ORAL
  Filled 2017-07-31: qty 2

## 2017-07-31 NOTE — Progress Notes (Signed)
[facility]  PROGRESS NOTE  [name] EHM:[id number] DOB: December 31, 1950 DOA: 07/29/2017 PCP: Asencion Noble, MD   Brief HPI:    67 y.o. female, w Dm2 , hypertension, hyperlipidemia, Gerd, OSA , RA, Copd apparently c/o dyspnea and wheezing for the past week, was worse today and therefore presented to the ED.  Pt notes cough (nonproductive).  Denies fever, chills, cp, palp, n/v, diarrhea, brbpr, dysuria, hematuria.     Subjective   Patient seen and examined, breathing is improved.   Assessment/Plan:     1. COPD exacerbation-significant improvement, she is off oxygen, continue Solu-Medrol 80 mg IV every 8 hours, Levaquin,  duo nebs every 6 hours. 2. UTI-patient has abnormal UA, started on Levaquin.  Urine culture grew multiple species. 3. Sinus tachycardia-d-dimer was negative, Heart rate is stable 4. Diabetes mellitus-hold metformin, Jardiance.  Continue Lantus, sliding scale insulin NovoLog. 5. Rheumatoid arthritis-hold methotrexate  6. Hypertension -blood pressure stable, continue Benicar 7. Anxiety - continue Xanax as needed        DVT prophylaxis: SCDs  Code Status: Full code  Family Communication: No family at bedside  Disposition Plan: likely home when medically ready for discharge   Consultants:  None   Procedures:  None    Antibiotics:   Anti-infectives (From admission, onward)   Start     Dose/Rate Route Frequency Ordered Stop   07/29/17 2200  levofloxacin (LEVAQUIN) IVPB 500 mg     500 mg 100 mL/hr over 60 Minutes Intravenous Every 24 hours 07/29/17 2105         Objective   Vitals:   07/31/17 0615 07/31/17 0752 07/31/17 1428 07/31/17 1459  BP:    (!) 119/52  Pulse:    92  Resp:    15  Temp:    97.7 F (36.5 C)  TempSrc:    Oral  SpO2:  93% 96% 95%  Weight: 117.5 kg (259 lb 1.6 oz)     Height:        Intake/Output Summary (Last 24 hours) at 07/31/2017 1543 Last data filed at 07/31/2017 0600 Gross per 24 hour  Intake 1420 ml  Output  3650 ml  Net -2230 ml   Filed Weights   07/29/17 2104 07/30/17 0500 07/31/17 0615  Weight: 119.3 kg (263 lb) 117 kg (257 lb 14.4 oz) 117.5 kg (259 lb 1.6 oz)     Physical Examination:   Mouth: Oral mucosa is moist, no lesions on palate,  Neck: Supple, no deformities, masses, or tenderness Lungs: Normal respiratory effort, bilateral wheezing auscultated. Heart: Regular rate and rhythm, S1 and S2 normal, no murmurs, rubs auscultated Abdomen: BS normoactive,soft,nondistended,non-tender to palpation,no organomegaly Extremities: No pretibial edema, no erythema, no cyanosis, no clubbing Neuro : Alert and oriented to time, place and person, No focal deficits        Data Reviewed: I have personally reviewed following labs and imaging studies  CBG: Recent Labs  Lab 07/30/17 1227 07/30/17 1730 07/30/17 2131 07/31/17 0740 07/31/17 1216  GLUCAP 227* 222* 235* 199* 232*    CBC: Recent Labs  Lab 07/29/17 1631 07/30/17 0346  WBC 8.9 5.2  NEUTROABS 7.3  --   HGB 13.5 12.9  HCT 43.4 42.4  MCV 77.1* 76.3*  PLT 228 366    Basic Metabolic Panel: Recent Labs  Lab 07/29/17 1631 07/30/17 0346  NA 138 138  K 4.5 4.1  CL 100* 99*  CO2 24 24  GLUCOSE 96 199*  BUN 31* 31*  CREATININE 1.09* 0.92  CALCIUM 9.0 8.9    Recent Results (from the past 240 hour(s))  Culture, Urine     Status: Abnormal   Collection Time: 07/29/17  4:08 PM  Result Value Ref Range Status   Specimen Description   Final    URINE, CLEAN CATCH Performed at [facility] [address], [place], [place] [address]    Special Requests   Final    NONE Performed at [facility], [place] [address], [place],  [address]    Culture MULTIPLE SPECIES PRESENT, SUGGEST RECOLLECTION (A)  Final   Report Status 07/31/2017 FINAL  Final     Liver Function Tests: Recent Labs  Lab 07/29/17 1631 07/30/17 0346  AST 26 17  ALT 21 20  ALKPHOS 66 61  BILITOT 0.6 0.6   PROT 8.0 7.2  ALBUMIN 3.6 3.3*   No results for input(s): LIPASE, AMYLASE in the last 168 hours. No results for input(s): AMMONIA in the last 168 hours.  Cardiac Enzymes: Recent Labs  Lab 07/29/17 2125 07/30/17 0346 07/30/17 0947  TROPONINI <0.03 <0.03 <0.03   BNP (last 3 results) Recent Labs    07/29/17 1631  BNP 15.7    ProBNP (last 3 results) No results for input(s): PROBNP in the last 8760 hours.    Studies: Dg Chest 2 View  Result Date: 07/29/2017 CLINICAL DATA:  Increasing shortness of breath.  COPD. EXAM: CHEST - 2 VIEW COMPARISON:  06/13/2017. FINDINGS: Normal sized heart. Clear lungs. Stable small amount of scarring at the right lateral lung base. The lungs are hyperexpanded with mild diffuse peribronchial thickening and accentuation of the interstitial markings. Thoracic spine degenerative changes, including changes of DISH. IMPRESSION: 1. No acute abnormality. 2. Stable changes of COPD and chronic bronchitis. Electronically Signed   By: Claudie Revering M.D.   On: 07/29/2017 16:30    Scheduled Meds: . albuterol  2.5 mg Nebulization Q6H  . aspirin EC  81 mg Oral Daily  . benzonatate  200 mg Oral TID  . enoxaparin (LOVENOX) injection  60 mg Subcutaneous QHS  . etodolac  400 mg Oral BID  . folic acid  1 mg Oral Daily  . gabapentin  600 mg Oral QHS  . guaiFENesin  600 mg Oral BID  . insulin aspart  0-5 Units Subcutaneous QHS  . insulin aspart  0-9 Units Subcutaneous TID WC  . insulin glargine  80 Units Subcutaneous BID  . irbesartan  300 mg Oral Daily  . loratadine  10 mg Oral Daily  . mouth rinse  15 mL Mouth Rinse BID  . methylPREDNISolone (SOLU-MEDROL) injection  80 mg Intravenous Q8H  . pantoprazole  40 mg Oral Daily  . simvastatin  40 mg Oral Q supper  . umeclidinium-vilanterol  1 puff Inhalation Daily      Time spent: 20 min  [facility] Pager [phone]. If 7PM-7AM, please contact night-coverage at [email or web address], Office   [phone]  password [email or web address]  07/31/2017, 3:43 PM  LOS: 2 days

## 2017-07-31 NOTE — Progress Notes (Signed)
SATURATION QUALIFICATIONS: (This note is used to comply with regulatory documentation for home oxygen)  Patient Saturations on Room Air at Rest = 94%  Patient Saturations on Room Air while Ambulating = 90%    Please briefly explain why patient needs home oxygen: Pt tolerated ambulating in hall without oxygen; mildly SOB but saturation maintained  Ender Rorke, Baxter Flattery B

## 2017-07-31 NOTE — Progress Notes (Deleted)
SATURATION QUALIFICATIONS: (This note is used to comply with regulatory documentation for home oxygen)  Patient Saturations on Room Air at Rest = 94%  Patient Saturations on Room Air while Ambulating = 86%  Patient Saturations on 2 Liters of oxygen while Ambulating = 96%  Please briefly explain why patient needs home oxygen:  Pt oxygen level decreased while ambulating and experienced SOB and some light headedness Gaylen Venning, Baxter Flattery B

## 2017-08-01 LAB — GLUCOSE, CAPILLARY: GLUCOSE-CAPILLARY: 184 mg/dL — AB (ref 65–99)

## 2017-08-01 MED ORDER — ALBUTEROL SULFATE (2.5 MG/3ML) 0.083% IN NEBU: 2.5000 mg | INHALATION_SOLUTION | Freq: Two times a day (BID) | RESPIRATORY_TRACT | Status: DC

## 2017-08-01 MED ORDER — GUAIFENESIN ER 600 MG PO TB12: 600.0000 mg | ORAL_TABLET | Freq: Two times a day (BID) | ORAL | 0 refills | Status: DC

## 2017-08-01 MED ORDER — PREDNISONE 10 MG PO TABS: ORAL_TABLET | ORAL | 0 refills | Status: DC

## 2017-08-01 NOTE — Care Management Important Message (Signed)
Important Message  Patient Details  Name: [name] MRN: [id number] Date of Birth: 1950/08/08   Medicare Important Message Given:  Yes    Kerin Salen 08/01/2017, 1:21 PMImportant Message  Patient Details  Name: [name] MRN: [id number] Date of Birth: [birth year]   Medicare Important Message Given:  Yes    Kerin Salen 08/01/2017, 1:21 PM

## 2017-08-01 NOTE — Discharge Summary (Addendum)
Physician Discharge Summary  [name] [id number]:[id number] DOB: 05-25-1950 DOA: 07/29/2017  PCP: Asencion Noble, MD  Admit date: 07/29/2017 Discharge date: 08/01/2017  Time spent: *35 minutes  Recommendations for Outpatient Follow-up:  1. Follow up PCP in 2 weeks  Discharge Diagnoses:  Principal Problem:   COPD exacerbation ([place]) Active Problems:   Insulin-requiring or dependent type II diabetes mellitus ([place])   Rheumatoid arthritis ([place])   Tachycardia   Discharge Condition: Stable  Diet recommendation: Regular diet  Filed Weights   07/30/17 0500 07/31/17 0615 08/01/17 0412  Weight: 117 kg (257 lb 14.4 oz) 117.5 kg (259 lb 1.6 oz) 118 kg (260 lb 1.6 oz)    History of present illness:  67 y.o.female,w Dm2 , hypertension, hyperlipidemia, Gerd, OSA , RA, Copd apparently c/o dyspnea and wheezing for the pastweek, was worse today and therefore presented to the ED. Pt notes cough (nonproductive). Denies fever, chills, cp, palp, n/v, diarrhea, brbpr, dysuria, hematuria.     Hospital Course:   1. COPD exacerbation-significant improvement, she is off oxygen, patient was started on  Solu-Medrol 80 mg IV every 8 hours, Levaquin,  duo nebs every 6 hours. Will d/c levaquin as no infection identified on culture. Will discharge home on Prednisone taper. 2. UTI-patient has abnormal UA, started on Levaquin.  Urine culture grew multiple species. Will discontinue Levaquin. 3. Sinus tachycardia-d-dimer was negative, Heart rate is stable 4. Diabetes mellitus-continue  metformin, Jardiance,Lantus. 5. Rheumatoid arthritis-hold methotrexate  6. Hypertension -blood pressure stable, continue Benicar      Procedures:  None   Consultations:  None   Discharge Exam: Vitals:   08/01/17 0412 08/01/17 0855  BP: 122/64   Pulse: 80   Resp: 20   Temp: (!) 97.5 F (36.4 C)   SpO2: 95% 94%    General: Appears in no acute distress Cardiovascular: S1s2 RRR Respiratory: scattered wheezing  bilaterally  Discharge Instructions    Allergies as of 08/01/2017      Reactions   Demerol Nausea Only   Severe   Hydromorphone Hcl Itching   All over the body.   Tetracycline Hives      Medication List    TAKE these medications   ALPRAZolam 0.5 MG tablet Commonly known as:  XANAX Take 0.5 mg by mouth 3 (three) times daily as needed for sleep or anxiety.   aspirin 81 MG tablet Take 81 mg by mouth daily.   BENICAR 40 MG tablet Generic drug:  olmesartan Take 40 mg by mouth daily.   benzonatate 200 MG capsule Commonly known as:  TESSALON Take 200 mg by mouth 3 (three) times daily.   BYDUREON 2 MG Pen Generic drug:  Exenatide ER Inject 2 mg into the muscle once a week.   CALCIUM 600+D 600-400 MG-UNIT tablet Generic drug:  Calcium Carbonate-Vitamin D Take 1 tablet by mouth 2 (two) times daily.   cetirizine 10 MG tablet Commonly known as:  ZYRTEC Take 10 mg by mouth daily.   empagliflozin 25 MG Tabs tablet Commonly known as:  JARDIANCE Take 25 mg by mouth daily.   etodolac 400 MG tablet Commonly known as:  LODINE Take 400 mg by mouth 2 (two) times daily.   fish oil-omega-3 fatty acids 1000 MG capsule Take 2 g by mouth every evening.   Fluticasone-Umeclidin-Vilant 100-62.5-25 MCG/INH Aepb Commonly known as:  TRELEGY ELLIPTA Inhale 1 puff into the lungs daily.   folic acid 1 MG tablet Commonly known as:  FOLVITE Take 1 mg by mouth daily.  furosemide 40 MG tablet Commonly known as:  LASIX Take 40 mg by mouth daily as needed for fluid. Fluid   gabapentin 600 MG tablet Commonly known as:  NEURONTIN Take 600 mg by mouth at bedtime.   guaiFENesin 600 MG 12 hr tablet Commonly known as:  MUCINEX Take 1 tablet (600 mg total) by mouth 2 (two) times daily.   ibuprofen 200 MG tablet Commonly known as:  ADVIL,MOTRIN Take 400-600 mg by mouth every 8 (eight) hours as needed. Pain   ICAPS MV PO Take 1 tablet by mouth 2 (two) times daily.   insulin aspart  100 UNIT/ML injection Commonly known as:  novoLOG Inject 10-15 Units into the skin 2 (two) times daily with a meal.   LANTUS 100 UNIT/ML injection Generic drug:  insulin glargine Inject 80 Units into the skin 2 (two) times daily.   metFORMIN 500 MG 24 hr tablet Commonly known as:  GLUCOPHAGE-XR Take 2,000 mg by mouth daily.   methocarbamol 750 MG tablet Commonly known as:  ROBAXIN Take 750 mg by mouth every 8 (eight) hours as needed. Muscle Spasms   methotrexate 2.5 MG tablet Commonly known as:  RHEUMATREX Take 20 mg by mouth once a week. Caution:Chemotherapy. Protect from light.   omeprazole 40 MG capsule Commonly known as:  PRILOSEC TAKE 1 CAPSULE EVERY MORNING   predniSONE 10 MG tablet Commonly known as:  DELTASONE Prednisone 40 mg po daily x 1 day then Prednisone 30 mg po daily x 1 day then Prednisone 20 mg po daily x 1 day then Prednisone 10 mg daily x 1 day then stop...   PROAIR HFA 108 (90 Base) MCG/ACT inhaler Generic drug:  albuterol INHALE 2 PUFFS INTO THE LUNGS EVERY 6 HOURS AS NEEDED FOR WHEEZING OR SHORTNESS OF BREATH   albuterol (2.5 MG/3ML) 0.083% nebulizer solution Commonly known as:  PROVENTIL Take 3 mLs (2.5 mg total) by nebulization every 6 (six) hours as needed for wheezing or shortness of breath (dx: J43.9).   REMICADE IV Inject 1 Dose into the vein every 6 (six) weeks. Every 8 weeks   simvastatin 40 MG tablet Commonly known as:  ZOCOR Take 40 mg by mouth daily with supper.      Allergies  Allergen Reactions  . Demerol Nausea Only    Severe  . Hydromorphone Hcl Itching    All over the body.  . Tetracycline Hives      The results of significant diagnostics from this hospitalization (including imaging, microbiology, ancillary and laboratory) are listed below for reference.    Significant Diagnostic Studies: Dg Chest 2 View  Result Date: 07/29/2017 CLINICAL DATA:  Increasing shortness of breath.  COPD. EXAM: CHEST - 2 VIEW COMPARISON:   06/13/2017. FINDINGS: Normal sized heart. Clear lungs. Stable small amount of scarring at the right lateral lung base. The lungs are hyperexpanded with mild diffuse peribronchial thickening and accentuation of the interstitial markings. Thoracic spine degenerative changes, including changes of DISH. IMPRESSION: 1. No acute abnormality. 2. Stable changes of COPD and chronic bronchitis. Electronically Signed   By: Claudie Revering M.D.   On: 07/29/2017 16:30    Microbiology: Recent Results (from the past 240 hour(s))  Culture, Urine     Status: Abnormal   Collection Time: 07/29/17  4:08 PM  Result Value Ref Range Status   Specimen Description   Final    URINE, CLEAN CATCH Performed at [facility], [place] [address], [place], [place] [address]    Special Requests  Final    NONE Performed at [facility], [place] [address] [place], [place] [address]    Culture MULTIPLE SPECIES PRESENT, SUGGEST RECOLLECTION (A)  Final   Report Status 07/31/2017 FINAL  Final     Labs: Basic Metabolic Panel: Recent Labs  Lab 07/29/17 1631 07/30/17 0346  NA 138 138  K 4.5 4.1  CL 100* 99*  CO2 24 24  GLUCOSE 96 199*  BUN 31* 31*  CREATININE 1.09* 0.92  CALCIUM 9.0 8.9   Liver Function Tests: Recent Labs  Lab 07/29/17 1631 07/30/17 0346  AST 26 17  ALT 21 20  ALKPHOS 66 61  BILITOT 0.6 0.6  PROT 8.0 7.2  ALBUMIN 3.6 3.3*   No results for input(s): LIPASE, AMYLASE in the last 168 hours. No results for input(s): AMMONIA in the last 168 hours. CBC: Recent Labs  Lab 07/29/17 1631 07/30/17 0346  WBC 8.9 5.2  NEUTROABS 7.3  --   HGB 13.5 12.9  HCT 43.4 42.4  MCV 77.1* 76.3*  PLT 228 213   Cardiac Enzymes: Recent Labs  Lab 07/29/17 2125 07/30/17 0346 07/30/17 0947  TROPONINI <0.03 <0.03 <0.03   BNP: BNP (last 3 results) Recent Labs    07/29/17 1631  BNP 15.7    ProBNP (last 3 results) No results for input(s): PROBNP in the last 8760  hours.  CBG: Recent Labs  Lab 07/31/17 0740 07/31/17 1216 07/31/17 1716 07/31/17 2023 08/01/17 0738  GLUCAP 199* 232* 168* 294* 184*       Signed:  Oswald Hillock MD.  [facility] 08/01/2017, 12:13 PM

## 2017-08-01 NOTE — Progress Notes (Signed)
Pt discharged from the unit via wheelchair. Discharge instructions reviewed with pt and family member. No questions or concerns at this time.

## 2017-08-04 ENCOUNTER — Other Ambulatory Visit: Payer: Self-pay | Admitting: *Deleted

## 2017-08-04 NOTE — Patient Outreach (Signed)
[facility]) Care Management  08/04/2017  [name] Mar 12, 1951 [id number]  RED Alert EMMI-General Discharge-Day##1, 08/03/2017 Reason: scheduled follow up-No  Telephone call attempt x1; left HIPPA compliant voice mail requesting call back.  Plan: Send outreach letter to patient. Follow up 2-4 business days.  Sherrin Daisy, RN BSN [place] Management Coordinator [facility] Care Management  [phone]

## 2017-08-05 ENCOUNTER — Encounter: Payer: Self-pay | Admitting: *Deleted

## 2017-08-07 ENCOUNTER — Ambulatory Visit (INDEPENDENT_AMBULATORY_CARE_PROVIDER_SITE_OTHER): Payer: Medicare Other | Admitting: Gastroenterology

## 2017-08-07 ENCOUNTER — Encounter: Payer: Self-pay | Admitting: Gastroenterology

## 2017-08-07 VITALS — BP 120/60 | HR 84 | Ht 62.0 in | Wt 259.0 lb

## 2017-08-07 DIAGNOSIS — Z86010 Personal history of colonic polyps: Secondary | ICD-10-CM | POA: Diagnosis not present

## 2017-08-07 DIAGNOSIS — K432 Incisional hernia without obstruction or gangrene: Secondary | ICD-10-CM

## 2017-08-07 DIAGNOSIS — Z794 Long term (current) use of insulin: Secondary | ICD-10-CM | POA: Diagnosis not present

## 2017-08-07 DIAGNOSIS — J439 Emphysema, unspecified: Secondary | ICD-10-CM

## 2017-08-07 DIAGNOSIS — E119 Type 2 diabetes mellitus without complications: Secondary | ICD-10-CM | POA: Diagnosis not present

## 2017-08-07 MED ORDER — NA SULFATE-K SULFATE-MG SULF 17.5-3.13-1.6 GM/177ML PO SOLN: 1.0000 | Freq: Once | ORAL | 0 refills | Status: AC

## 2017-08-07 NOTE — Patient Instructions (Signed)
If you are age 67 or older, your body mass index should be between 23-30. Your Body mass index is 47.37 kg/m. If this is out of the aforementioned range listed, please consider follow up with your Primary Care Provider.  If you are age 50 or younger, your body mass index should be between 19-25. Your Body mass index is 47.37 kg/m. If this is out of the aformentioned range listed, please consider follow up with your Primary Care Provider.   You have been scheduled for a colonoscopy. Please follow written instructions given to you at your visit today.  Please pick up your prep supplies at the pharmacy within the next 1-3 days. If you use inhalers (even only as needed), please bring them with you on the day of your procedure. Your physician has requested that you go to www.[email or web address] and enter the access code given to you at your visit today. This web site gives a general overview about your procedure. However, you should still follow specific instructions given to you by our office regarding your preparation for the procedure.  Thank you for choosing [facility]  Dr Wilfrid Lund III

## 2017-08-07 NOTE — Progress Notes (Signed)
[name] Consult Note:  History: [name] 08/07/2017  Referring physician: Asencion Noble, MD  Reason for consult/chief complaint: Colon Cancer Screening (was due for recall colon in October. saw Dr. Laural [name] but it is more convient to come to [name]. hx of polyps. )   Subjective  HPI:  This very pleasant patient requested to see [place] for history of colon polyps and to plan surveillance colonoscopy.  Her last colonoscopy with Dr. Laural [name] in [place] was in August 2013, report notes diverticulosis and a subcentimeter cecal tubular adenoma removed. The patient was admitted to [name] from 4/20 to 08/01/2017 for COPD exacerbation requiring nebulizers steroids, and oxygen treatment.  She went home off of oxygen but on steroids.  She reports that her breathing is improving, but really has not been good since mid to late December, and she has had multiple rounds of steroids and antibiotics since then.  She is due to see her pulmonologist,, Dr [name], later this week.  [name] denies chronic abdominal pain or GI bleeding.  She has an enlarging abdominal wall hernia and has been told by surgery it cannot be repaired.  She denies nausea vomiting or dysphagia.  She tends toward constipation with a BM about every other day.  ROS:  Review of Systems  Constitutional: Negative for appetite change and unexpected weight change.  HENT: Negative for mouth sores and voice change.   Eyes: Negative for pain and redness.  Respiratory: Positive for cough and shortness of breath.   Cardiovascular: Negative for chest pain and palpitations.  Genitourinary: Negative for dysuria and hematuria.  Musculoskeletal: Positive for arthralgias. Negative for myalgias.  Skin: Negative for pallor and rash.  Allergic/Immunologic: Positive for environmental allergies.  Neurological: Negative for weakness and headaches.  Hematological: Negative for adenopathy.     Past Medical  History: Past Medical History:  Diagnosis Date  . Asthma   . COPD (chronic obstructive pulmonary disease) ([name])   . Depression   . DM type 1 (diabetes mellitus, type 1) ([place])   . GERD (gastroesophageal reflux disease)   . Hyperlipidemia   . Hypertension   . Morbid obesity ([name])   . Obstructive sleep apnea syndrome   . Osteoarthritis of right knee 04/2010   end-stage  . Rheumatoid arthritis(714.0)    on methotrexate therapy  . Septic arthritis of knee, right ([name]) 04/2010     Past Surgical History: Past Surgical History:  Procedure Laterality Date  . ABDOMINAL HYSTERECTOMY  1992  . ABDOMINAL WALL MESH  REMOVAL  08/13/2009   with removal of retained stitches  . [place]   for cardiac tamponade  . APPENDECTOMY  1967  . BONE BIOPSY Right 04/19/2016   Procedure: BONE BIOPSY RIGHT GREAT TOE;  Surgeon: [name], DPM;  Location: [facility];  Service: Podiatry;  Laterality: Right;  . CHOLECYSTECTOMY  1992   laparoscopic  . COLONOSCOPY  12/08/2011   Procedure: COLONOSCOPY;  Surgeon: Wendy [facility], MD;  Location: [facility];  Service: Endoscopy;  Laterality: N/A;  830  . FOREIGN BODY REMOVAL ABDOMINAL  01/04/2010   stitch abscesses  . HERNIA REPAIR  2001   open LOA / [place] repair w mesh.  Dr. Arnoldo [name]  . HERNIA REPAIR  2007   open redo LOA / [place] repair w mesh.  Dr. Arnoldo [name]  . REVISION TOTAL KNEE ARTHROPLASTY  06/09/2010   I and D   . TOTAL KNEE ARTHROPLASTY  05/07/2010   right knee  Family History: Family History  Problem Relation Age of Onset  . Cancer Mother   . Hyperlipidemia Mother   . Diabetes Mother   . Diabetes Father   . COPD Father   . Arthritis Father        RA  . Hyperlipidemia Brother   . Hypertension Brother     Social History: Social History   Socioeconomic History  . Marital status: Widowed    Spouse name: Not on file  . Number of children: Not on file  . Years of education: Not on file  . Highest education  level: Not on file  Occupational History  . Not on file  Social Needs  . Financial resource strain: Not on file  . Food insecurity:    Worry: Not on file    Inability: Not on file  . Transportation needs:    Medical: Not on file    Non-medical: Not on file  Tobacco Use  . Smoking status: Former Smoker    Packs/day: 2.00    Years: 25.00    Pack years: 50.00    Types: Cigarettes    Last attempt to quit: 04/11/1992    Years since quitting: 25.3  . Smokeless tobacco: Never Used  Substance and Sexual Activity  . Alcohol use: No  . Drug use: No  . Sexual activity: Yes    Partners: Male  Lifestyle  . Physical activity:    Days per week: Not on file    Minutes per session: Not on file  . Stress: Not on file  Relationships  . Social connections:    Talks on phone: Not on file    Gets together: Not on file    Attends religious service: Not on file    Active member of club or organization: Not on file    Attends meetings of clubs or organizations: Not on file    Relationship status: Not on file  Other Topics Concern  . Not on file  Social History Narrative  . Not on file    Allergies: Allergies  Allergen Reactions  . Demerol Nausea Only    Severe  . Hydromorphone Hcl Itching    All over the body.  . Tetracycline Hives    Outpatient Meds: Current Outpatient Medications  Medication Sig Dispense Refill  . albuterol (PROVENTIL) (2.5 MG/3ML) 0.083% nebulizer solution Take 3 mLs (2.5 mg total) by nebulization every 6 (six) hours as needed for wheezing or shortness of breath (dx: J43.9). 360 mL 5  . ALPRAZolam (XANAX) 0.5 MG tablet Take 0.5 mg by mouth 3 (three) times daily as needed for sleep or anxiety.     Marland Kitchen aspirin 81 MG tablet Take 81 mg by mouth daily.    Marland Kitchen BENICAR 40 MG tablet Take 40 mg by mouth daily.    . benzonatate (TESSALON) 200 MG capsule Take 200 mg by mouth 3 (three) times daily.  0  . BYDUREON 2 MG PEN Inject 2 mg into the muscle once a week.    . Calcium  Carbonate-Vitamin D (CALCIUM 600+D) 600-400 MG-UNIT per tablet Take 1 tablet by mouth 2 (two) times daily.      . cetirizine (ZYRTEC) 10 MG tablet Take 10 mg by mouth daily.    . empagliflozin (JARDIANCE) 25 MG TABS tablet Take 25 mg by mouth daily.    Marland Kitchen etodolac (LODINE) 400 MG tablet Take 400 mg by mouth 2 (two) times daily.    . fish oil-omega-3 fatty acids 1000 MG capsule  Take 2 g by mouth every evening.     . Fluticasone-Umeclidin-Vilant (TRELEGY ELLIPTA) 100-62.5-25 MCG/INH AEPB Inhale 1 puff into the lungs daily. 90 each 3  . folic acid (FOLVITE) 1 MG tablet Take 1 mg by mouth daily.    . furosemide (LASIX) 40 MG tablet Take 40 mg by mouth daily as needed for fluid. Fluid    . gabapentin (NEURONTIN) 600 MG tablet Take 600 mg by mouth at bedtime.    Marland Kitchen guaiFENesin (MUCINEX) 600 MG 12 hr tablet Take 1 tablet (600 mg total) by mouth 2 (two) times daily. 10 tablet 0  . ibuprofen (ADVIL,MOTRIN) 200 MG tablet Take 400-600 mg by mouth every 8 (eight) hours as needed. Pain    . InFLIXimab (REMICADE IV) Inject 1 Dose into the vein every 6 (six) weeks. Every 8 weeks     . insulin aspart (NOVOLOG) 100 UNIT/ML injection Inject 10-15 Units into the skin 2 (two) times daily with a meal.     . insulin glargine (LANTUS) 100 UNIT/ML injection Inject 80 Units into the skin 2 (two) times daily.     . metFORMIN (GLUCOPHAGE-XR) 500 MG 24 hr tablet Take 2,000 mg by mouth daily.      . methocarbamol (ROBAXIN) 750 MG tablet Take 750 mg by mouth every 8 (eight) hours as needed. Muscle Spasms    . Multiple Vitamins-Minerals (ICAPS MV PO) Take 1 tablet by mouth 2 (two) times daily.    Marland Kitchen omeprazole (PRILOSEC) 40 MG capsule TAKE 1 CAPSULE EVERY MORNING 90 capsule 1  . PROAIR HFA 108 (90 BASE) MCG/ACT inhaler INHALE 2 PUFFS INTO THE LUNGS EVERY 6 HOURS AS NEEDED FOR WHEEZING OR SHORTNESS OF BREATH 3 each 3  . simvastatin (ZOCOR) 40 MG tablet Take 40 mg by mouth daily with supper.     . methotrexate (RHEUMATREX) 2.5 MG  tablet Take 20 mg by mouth once a week. Caution:Chemotherapy. Protect from light.     No current facility-administered medications for this visit.       ___________________________________________________________________ Objective   Exam:  BP 120/60   Pulse 84   Ht 5\' 2"  (1.575 m)   Wt 259 lb (117.5 kg)   BMI 47.37 kg/m    General: this is a(n) pleasant woman, antalgic gait, dry cough, no respiratory distress, speaks full sentences without difficulty  Eyes: sclera anicteric, no redness  ENT: oral mucosa moist without lesions, no cervical or supraclavicular lymphadenopathy, good dentition  CV: RRR without murmur, S1/S2, no JVD, mild peripheral edema below the knee, mild venous stasis changes as well  Resp: clear to auscultation bilaterally, normal RR and effort noted  GI: soft, no tenderness, with active bowel sounds. No guarding or palpable organomegaly noted.  Exam limited by body habitus, has a large abdominal wall hernia requiring a binder  Skin; warm and dry, no rash or jaundice noted  Neuro: awake, alert and oriented x 3. Normal gross motor function and fluent speech  Labs:  CBC Latest Ref Rng & Units 07/30/2017 07/29/2017 09/20/2016  WBC 4.0 - 10.5 K/uL 5.2 8.9 5.9  Hemoglobin 12.0 - 15.0 g/dL 12.9 13.5 12.9  Hematocrit 36.0 - 46.0 % 42.4 43.4 40.9  Platelets 150 - 400 K/uL 213 228 178   CMP Latest Ref Rng & Units 07/30/2017 07/29/2017 09/20/2016  Glucose 65 - 99 mg/dL 199(H) 96 141(H)  BUN 6 - 20 mg/dL 31(H) 31(H) 11  Creatinine 0.44 - 1.00 mg/dL 0.92 1.09(H) 0.76  Sodium 135 - 145 mmol/L 138  138 139  Potassium 3.5 - 5.1 mmol/L 4.1 4.5 3.5  Chloride 101 - 111 mmol/L 99(L) 100(L) 107  CO2 22 - 32 mmol/L 24 24 23   Calcium 8.9 - 10.3 mg/dL 8.9 9.0 8.5(L)  Total Protein 6.5 - 8.1 g/dL 7.2 8.0 -  Total Bilirubin 0.3 - 1.2 mg/dL 0.6 0.6 -  Alkaline Phos 38 - 126 U/L 61 66 -  AST 15 - 41 U/L 17 26 -  ALT 14 - 54 U/L 20 21 -     Assessment: Encounter Diagnoses    Name Primary?  . Personal history of colonic polyps Yes  . Pulmonary emphysema, unspecified emphysema type ([place])   . Insulin-requiring or dependent type II diabetes mellitus ([place])   . Recurrent ventral incisional hernia   . MORBID OBESITY     I have offered her colonoscopy and discussed in the procedure and its risks.  She is at increased risk of sedation related complications due to her morbid obesity and COPD.  As such, I feel we should wait a few months until she has improved and stable pulmonary function.  Her procedure also needs to be done in the [name] outpatient endoscopy lab.  She is agreeable to proceed.  The benefits and risks of the planned procedure were described in detail with the patient or (when appropriate) their health care proxy.  Risks were outlined as including, but not limited to, bleeding, infection, perforation, adverse medication reaction leading to cardiac or pulmonary decompensation, or pancreatitis (if ERCP).  The limitation of incomplete mucosal visualization was also discussed.  No guarantees or warranties were given.  Patient at increased risk for cardiopulmonary complications of procedure due to medical comorbidities.   Thank you for the courtesy of this consult.  Please call me with any questions or concerns.  Nelida Meuse III  CC: [facility], MD [name], MD

## 2017-08-08 ENCOUNTER — Other Ambulatory Visit: Payer: Self-pay | Admitting: Pulmonary Disease

## 2017-08-08 ENCOUNTER — Other Ambulatory Visit: Payer: Self-pay | Admitting: *Deleted

## 2017-08-08 DIAGNOSIS — G4733 Obstructive sleep apnea (adult) (pediatric): Secondary | ICD-10-CM

## 2017-08-08 NOTE — Patient Outreach (Addendum)
[place] [facility]) Care Management  08/08/2017  STEVEY STAPLETON 1951/03/22 [id number]   RED Alert EMMI-General Discharge-Day##1, 08/03/2017 Reason: scheduled follow up-No  2nd Red EMMI-Alert for General Discharge -day #4, 08/06/2017: Reason: Sad, hopeless/anxious/empty: yes  Telephone call x 2 to patient; left HIPPA compliant voice mail requesting call back. Outreach letter was sent after 1st unsuccessful call attempt.  Plan: Will follow up 2-4 business days.  Sherrin Daisy, RN BSN [place] Management Coordinator [facility] Care Management  [phone]

## 2017-08-09 ENCOUNTER — Ambulatory Visit (INDEPENDENT_AMBULATORY_CARE_PROVIDER_SITE_OTHER): Payer: Medicare Other | Admitting: Pulmonary Disease

## 2017-08-09 ENCOUNTER — Encounter: Payer: Self-pay | Admitting: Pulmonary Disease

## 2017-08-09 VITALS — BP 110/70 | HR 94 | Ht 62.0 in | Wt 262.0 lb

## 2017-08-09 DIAGNOSIS — R0609 Other forms of dyspnea: Secondary | ICD-10-CM | POA: Diagnosis not present

## 2017-08-09 DIAGNOSIS — J449 Chronic obstructive pulmonary disease, unspecified: Secondary | ICD-10-CM

## 2017-08-09 DIAGNOSIS — G4733 Obstructive sleep apnea (adult) (pediatric): Secondary | ICD-10-CM

## 2017-08-09 DIAGNOSIS — Z8739 Personal history of other diseases of the musculoskeletal system and connective tissue: Secondary | ICD-10-CM | POA: Diagnosis not present

## 2017-08-09 LAB — PULMONARY FUNCTION TEST
DL/VA % pred: 120 %
DL/VA: 5.48 ml/min/mmHg/L
DLCO UNC % PRED: 97 %
DLCO unc: 20.99 ml/min/mmHg
FEF 25-75 Post: 0.92 L/sec
FEF 25-75 Pre: 0.63 L/sec
FEF2575-%CHANGE-POST: 44 %
FEF2575-%PRED-POST: 47 %
FEF2575-%Pred-Pre: 32 %
FEV1-%CHANGE-POST: 11 %
FEV1-%Pred-Post: 61 %
FEV1-%Pred-Pre: 55 %
FEV1-POST: 1.35 L
FEV1-PRE: 1.21 L
FEV1FVC-%Change-Post: 6 %
FEV1FVC-%Pred-Pre: 84 %
FEV6-%Change-Post: 6 %
FEV6-%PRED-PRE: 67 %
FEV6-%Pred-Post: 71 %
FEV6-PRE: 1.85 L
FEV6-Post: 1.96 L
FEV6FVC-%Change-Post: 1 %
FEV6FVC-%PRED-PRE: 102 %
FEV6FVC-%Pred-Post: 104 %
FVC-%CHANGE-POST: 4 %
FVC-%PRED-POST: 68 %
FVC-%PRED-PRE: 65 %
FVC-POST: 1.96 L
FVC-Pre: 1.87 L
POST FEV1/FVC RATIO: 69 %
POST FEV6/FVC RATIO: 100 %
PRE FEV6/FVC RATIO: 99 %
Pre FEV1/FVC ratio: 65 %
RV % PRED: 125 %
RV: 2.54 L
TLC % pred: 98 %
TLC: 4.67 L

## 2017-08-09 MED ORDER — AMOXICILLIN-POT CLAVULANATE 875-125 MG PO TABS: 1.0000 | ORAL_TABLET | Freq: Two times a day (BID) | ORAL | 0 refills | Status: DC

## 2017-08-09 MED ORDER — MONTELUKAST SODIUM 10 MG PO TABS: 10.0000 mg | ORAL_TABLET | Freq: Every day | ORAL | 5 refills | Status: DC

## 2017-08-09 NOTE — Patient Instructions (Signed)
Will schedule high resolution CT chest Augmentin one pill twice per day for 7 days Singulair 10 mg pill nightly  Follow up in 6 weeks with Dr. Halford Chessman or Nurse Practitioner

## 2017-08-09 NOTE — Progress Notes (Signed)
PFT completed 08/09/17   

## 2017-08-09 NOTE — Progress Notes (Signed)
[facility]  Chief Complaint  Patient presents with  . Follow-up    PFT done today. ER visit 4/20 for COPD exacerbation. Reports she still has dry cough. Needs refill on tessalon pearls.     Vital signs: BP 110/70 (BP Location: Left Arm, Cuff Size: Normal)   Pulse 94   Ht 5\' 2"  (1.575 m)   Wt 262 lb (118.8 kg)   SpO2 96%   BMI 47.92 kg/m   History of Present Illness: [name] is a 67 y.o. female former smoker with obstructive sleep apnea and COPD.  Since I saw her last she was treated with prednisone several times.  She was also admitted to the hospital.  She still feels crummy.  She is coughing and brings up clear to yellow sputum.  She gets a rattle in her chest.  She is not having fever.  Sinus and throat are okay.  She doesn't have any energy.  No skin rash or leg swelling.  Has been off methotrexate.  Is due for remicade shot later this month.  Feels trelegy works well.  Uses CPAP nightly.   Physical Exam:  General - pleasant Eyes - pupils reactive ENT - no sinus tenderness, no oral exudate, no LAN Cardiac - regular, no murmur Chest - b/l rhonchi that partially clear with cough Abd - soft, non tender Ext - no edema Skin - no rashes Neuro - normal strength Psych - normal mood  CXR 07/29/17 >> changes of bronchitis   CMP Latest Ref Rng & Units 07/30/2017 07/29/2017 09/20/2016  Glucose 65 - 99 mg/dL 199(H) 96 141(H)  BUN 6 - 20 mg/dL 31(H) 31(H) 11  Creatinine 0.44 - 1.00 mg/dL 0.92 1.09(H) 0.76  Sodium 135 - 145 mmol/L 138 138 139  Potassium 3.5 - 5.1 mmol/L 4.1 4.5 3.5  Chloride 101 - 111 mmol/L 99(L) 100(L) 107  CO2 22 - 32 mmol/L 24 24 23   Calcium 8.9 - 10.3 mg/dL 8.9 9.0 8.5(L)  Total Protein 6.5 - 8.1 g/dL 7.2 8.0 -  Total Bilirubin 0.3 - 1.2 mg/dL 0.6 0.6 -  Alkaline Phos 38 - 126 U/L 61 66 -  AST 15 - 41 U/L 17 26 -  ALT 14 - 54 U/L 20 21 -    CBC Latest Ref Rng & Units 07/30/2017 07/29/2017 09/20/2016  WBC 4.0 -  10.5 K/uL 5.2 8.9 5.9  Hemoglobin 12.0 - 15.0 g/dL 12.9 13.5 12.9  Hematocrit 36.0 - 46.0 % 42.4 43.4 40.9  Platelets 150 - 400 K/uL 213 228 178     Assessment/Plan:  COPD with chronic bronchitis. - she has persistent exacerbation - will give course of augmentin - continue trelegy - trying to avoid additional prednisone - prn albuterol - add singulair since she might have an allergy/asthma component - will arrange for high resolution CT chest to assess other causes of her persistent symptoms  Obstructive sleep apnea. - she is compliant with CPAP and reports benefit - continue CPAP 8 cm H2O  Obesity. - discussed importance of weight loss  Rheumatoid arthritis. - advised best to hold methotrexate for now - she will f/u with Dr. Amil Amen for management of remicade   Patient Instructions  Will schedule high resolution CT chest Augmentin one pill twice per day for 7 days Singulair 10 mg pill nightly  Follow up in 6 weeks with Dr. Halford Chessman or Nurse Practitioner    [name], MD [place] 08/09/2017, 3:29 PM Pager:  [phone]  Flow Sheet  Sleep tests: PSG 09/02/07 >> AHI 9 CPAP 07/08/17 to 08/06/17 >> used on 27 of 30 nights with average 8 hrs 30 min.  Average AHI 0.9 with CPAP 10 cm H2O  Pulmonary tests: PFT 09/18/07 >> FEV1 1.38 (61%), FEV1% 59, TLC 4.48 (95%), DLCO 80%, + BD PFT 08/09/17 >> FEV1 1.35 (61%), FEV1% 69, TLC 4.67 (98%), DLCO 97%  Past Medical History: She  has a past medical history of Asthma, COPD (chronic obstructive pulmonary disease) ([place]), Depression, DM type 1 (diabetes mellitus, type 1) ([place]), GERD (gastroesophageal reflux disease), Hyperlipidemia, Hypertension, Morbid obesity ([place]), Obstructive sleep apnea syndrome, Osteoarthritis of right knee (04/2010), Rheumatoid arthritis(714.0), and Septic arthritis of knee, right ([place]) (04/2010).  Past Surgical History: She  has a past surgical history that includes Total knee  arthroplasty (05/07/2010); Revision total knee arthroplasty (06/09/2010); Appendectomy (1967); Abdominal hysterectomy (1992); Cholecystectomy (1992); [place]-pulmonary window repair (1992); Hernia repair (2001); Hernia repair (2007); Abdominal wall mesh  removal (08/13/2009); Foreign body removal abdominal (01/04/2010); Colonoscopy (12/08/2011); and Bone biopsy (Right, 04/19/2016).  Family History: Her family history includes Arthritis in her father; COPD in her father; Cancer in her mother; Diabetes in her father and mother; Hyperlipidemia in her brother and mother; Hypertension in her brother.  Social History: She  reports that she quit smoking about 25 years ago. Her smoking use included cigarettes. She has a 50.00 pack-year smoking history. She has never used smokeless tobacco. She reports that she does not drink alcohol or use drugs.  Medications: Allergies as of 08/09/2017      Reactions   Demerol Nausea Only   Severe   Hydromorphone Hcl Itching   All over the body.   Tetracycline Hives      Medication List        Accurate as of 08/09/17  3:29 PM. Always use your most recent med list.          ALPRAZolam 0.5 MG tablet Commonly known as:  XANAX Take 0.5 mg by mouth 3 (three) times daily as needed for sleep or anxiety.   amoxicillin-clavulanate 875-125 MG tablet Commonly known as:  AUGMENTIN Take 1 tablet by mouth 2 (two) times daily.   aspirin 81 MG tablet Take 81 mg by mouth daily.   BENICAR 40 MG tablet Generic drug:  olmesartan Take 40 mg by mouth daily.   benzonatate 200 MG capsule Commonly known as:  TESSALON Take 200 mg by mouth 3 (three) times daily.   BYDUREON 2 MG Pen Generic drug:  Exenatide ER Inject 2 mg into the muscle once a week.   CALCIUM 600+D 600-400 MG-UNIT tablet Generic drug:  Calcium Carbonate-Vitamin D Take 1 tablet by mouth 2 (two) times daily.   cetirizine 10 MG tablet Commonly known as:  ZYRTEC Take 10 mg by mouth daily.   empagliflozin 25  MG Tabs tablet Commonly known as:  JARDIANCE Take 25 mg by mouth daily.   etodolac 400 MG tablet Commonly known as:  LODINE Take 400 mg by mouth 2 (two) times daily.   fish oil-omega-3 fatty acids 1000 MG capsule Take 2 g by mouth every evening.   Fluticasone-Umeclidin-Vilant 100-62.5-25 MCG/INH Aepb Commonly known as:  TRELEGY ELLIPTA Inhale 1 puff into the lungs daily.   folic acid 1 MG tablet Commonly known as:  FOLVITE Take 1 mg by mouth daily.   furosemide 40 MG tablet Commonly known as:  LASIX Take 40 mg by mouth daily as needed for fluid. Fluid  gabapentin 600 MG tablet Commonly known as:  NEURONTIN Take 600 mg by mouth at bedtime.   guaiFENesin 600 MG 12 hr tablet Commonly known as:  MUCINEX Take 1 tablet (600 mg total) by mouth 2 (two) times daily.   ibuprofen 200 MG tablet Commonly known as:  ADVIL,MOTRIN Take 400-600 mg by mouth every 8 (eight) hours as needed. Pain   ICAPS MV PO Take 1 tablet by mouth 2 (two) times daily.   insulin aspart 100 UNIT/ML injection Commonly known as:  novoLOG Inject 10-15 Units into the skin 2 (two) times daily with a meal.   LANTUS 100 UNIT/ML injection Generic drug:  insulin glargine Inject 80 Units into the skin 2 (two) times daily.   metFORMIN 500 MG 24 hr tablet Commonly known as:  GLUCOPHAGE-XR Take 2,000 mg by mouth daily.   methocarbamol 750 MG tablet Commonly known as:  ROBAXIN Take 750 mg by mouth every 8 (eight) hours as needed. Muscle Spasms   methotrexate 2.5 MG tablet Commonly known as:  RHEUMATREX Take 20 mg by mouth once a week. Caution:Chemotherapy. Protect from light.   montelukast 10 MG tablet Commonly known as:  SINGULAIR Take 1 tablet (10 mg total) by mouth at bedtime.   omeprazole 40 MG capsule Commonly known as:  PRILOSEC TAKE 1 CAPSULE EVERY MORNING   PROAIR HFA 108 (90 Base) MCG/ACT inhaler Generic drug:  albuterol INHALE 2 PUFFS INTO THE LUNGS EVERY 6 HOURS AS NEEDED FOR WHEEZING OR  SHORTNESS OF BREATH   albuterol (2.5 MG/3ML) 0.083% nebulizer solution Commonly known as:  PROVENTIL Take 3 mLs (2.5 mg total) by nebulization every 6 (six) hours as needed for wheezing or shortness of breath (dx: J43.9).   REMICADE IV Inject 1 Dose into the vein every 6 (six) weeks. Every 8 weeks   simvastatin 40 MG tablet Commonly known as:  ZOCOR Take 40 mg by mouth daily with supper.

## 2017-08-14 ENCOUNTER — Encounter: Payer: Self-pay | Admitting: Pulmonary Disease

## 2017-08-14 ENCOUNTER — Other Ambulatory Visit: Payer: Self-pay | Admitting: *Deleted

## 2017-08-14 NOTE — Patient Outreach (Signed)
[place] [facility]) Care Management  08/14/2017  [name] 02-08-1951 [id number]  RED Alert EMMI-General Discharge-Day##1, 08/03/2017 Reason: scheduled follow up-No  2nd Red EMMI-Alert for General Discharge -day #4, 08/06/2017: Reason: Sad, hopeless/anxious/empty: yes  Telephone call to patient; who was advised of reason for call. States she does have some sadness but thinks it has to do with living alone. States she has talked to her MD about it in the past and he made some suggestions that she needed to think about. States she plans to talk to him about it at next visit. States she continues to do things that she is interested in and enjoys being with her friends. States she feels very comfortable talking with her MD .   No further concerns. EMMI alert addressed.  Plan: Close case.   Sherrin Daisy, RN BSN [place] Management Coordinator [facility] Care Management  [phone]

## 2017-08-16 ENCOUNTER — Ambulatory Visit (INDEPENDENT_AMBULATORY_CARE_PROVIDER_SITE_OTHER)
Admission: RE | Admit: 2017-08-16 | Discharge: 2017-08-16 | Disposition: A | Discharge status: Home / Self Care | Payer: Medicare Other | Source: Ambulatory Visit | Attending: Pulmonary Disease | Admitting: Pulmonary Disease

## 2017-08-16 DIAGNOSIS — J449 Chronic obstructive pulmonary disease, unspecified: Secondary | ICD-10-CM

## 2017-08-16 DIAGNOSIS — R0609 Other forms of dyspnea: Secondary | ICD-10-CM

## 2017-08-16 DIAGNOSIS — R05 Cough: Secondary | ICD-10-CM | POA: Diagnosis not present

## 2017-08-16 DIAGNOSIS — Z8739 Personal history of other diseases of the musculoskeletal system and connective tissue: Secondary | ICD-10-CM

## 2017-08-21 ENCOUNTER — Telehealth: Payer: Self-pay | Admitting: Pulmonary Disease

## 2017-08-21 DIAGNOSIS — Q208 Other congenital malformations of cardiac chambers and connections: Secondary | ICD-10-CM

## 2017-08-21 DIAGNOSIS — R0602 Shortness of breath: Secondary | ICD-10-CM

## 2017-08-21 NOTE — Telephone Encounter (Signed)
HRCT chest 08/17/17 >> calcification RV, atherosclerosis, scarring, mild air trapping, micronodularity LLL, 1.4 cm nodule LUL, fatty liver   Results d/w pt.  Will arrange for Echo to assess RV calcification.

## 2017-08-22 ENCOUNTER — Encounter: Payer: Self-pay | Admitting: Pulmonary Disease

## 2017-08-22 DIAGNOSIS — M0589 Other rheumatoid arthritis with rheumatoid factor of multiple sites: Secondary | ICD-10-CM | POA: Diagnosis not present

## 2017-08-22 DIAGNOSIS — Z79899 Other long term (current) drug therapy: Secondary | ICD-10-CM | POA: Diagnosis not present

## 2017-08-23 DIAGNOSIS — F419 Anxiety disorder, unspecified: Secondary | ICD-10-CM | POA: Diagnosis not present

## 2017-08-23 DIAGNOSIS — E1140 Type 2 diabetes mellitus with diabetic neuropathy, unspecified: Secondary | ICD-10-CM | POA: Diagnosis not present

## 2017-08-23 DIAGNOSIS — Z79899 Other long term (current) drug therapy: Secondary | ICD-10-CM | POA: Diagnosis not present

## 2017-08-23 DIAGNOSIS — I10 Essential (primary) hypertension: Secondary | ICD-10-CM | POA: Diagnosis not present

## 2017-08-28 ENCOUNTER — Other Ambulatory Visit: Payer: Self-pay

## 2017-08-28 DIAGNOSIS — E11621 Type 2 diabetes mellitus with foot ulcer: Secondary | ICD-10-CM | POA: Diagnosis not present

## 2017-08-28 DIAGNOSIS — E1142 Type 2 diabetes mellitus with diabetic polyneuropathy: Secondary | ICD-10-CM | POA: Diagnosis not present

## 2017-08-28 MED ORDER — MONTELUKAST SODIUM 10 MG PO TABS: 10.0000 mg | ORAL_TABLET | Freq: Every day | ORAL | 3 refills | Status: DC

## 2017-08-29 DIAGNOSIS — E785 Hyperlipidemia, unspecified: Secondary | ICD-10-CM | POA: Diagnosis not present

## 2017-08-29 DIAGNOSIS — F329 Major depressive disorder, single episode, unspecified: Secondary | ICD-10-CM | POA: Diagnosis not present

## 2017-08-29 DIAGNOSIS — E1140 Type 2 diabetes mellitus with diabetic neuropathy, unspecified: Secondary | ICD-10-CM | POA: Diagnosis not present

## 2017-08-29 DIAGNOSIS — I700 Atherosclerosis of aorta: Secondary | ICD-10-CM | POA: Diagnosis not present

## 2017-09-01 ENCOUNTER — Ambulatory Visit: Payer: Medicare Other | Admitting: Endocrinology

## 2017-09-01 ENCOUNTER — Other Ambulatory Visit: Payer: Self-pay

## 2017-09-01 ENCOUNTER — Ambulatory Visit (HOSPITAL_COMMUNITY): Payer: Medicare Other | Attending: Cardiology

## 2017-09-01 DIAGNOSIS — G4733 Obstructive sleep apnea (adult) (pediatric): Secondary | ICD-10-CM | POA: Insufficient documentation

## 2017-09-01 DIAGNOSIS — R011 Cardiac murmur, unspecified: Secondary | ICD-10-CM | POA: Diagnosis not present

## 2017-09-01 DIAGNOSIS — Z87891 Personal history of nicotine dependence: Secondary | ICD-10-CM | POA: Insufficient documentation

## 2017-09-01 DIAGNOSIS — Q208 Other congenital malformations of cardiac chambers and connections: Secondary | ICD-10-CM

## 2017-09-01 DIAGNOSIS — I350 Nonrheumatic aortic (valve) stenosis: Secondary | ICD-10-CM | POA: Diagnosis not present

## 2017-09-01 DIAGNOSIS — J449 Chronic obstructive pulmonary disease, unspecified: Secondary | ICD-10-CM | POA: Insufficient documentation

## 2017-09-01 DIAGNOSIS — R0602 Shortness of breath: Secondary | ICD-10-CM | POA: Insufficient documentation

## 2017-09-01 DIAGNOSIS — E785 Hyperlipidemia, unspecified: Secondary | ICD-10-CM | POA: Diagnosis not present

## 2017-09-05 DIAGNOSIS — Z96651 Presence of right artificial knee joint: Secondary | ICD-10-CM | POA: Diagnosis not present

## 2017-09-05 DIAGNOSIS — Z96652 Presence of left artificial knee joint: Secondary | ICD-10-CM | POA: Diagnosis not present

## 2017-09-05 DIAGNOSIS — M25561 Pain in right knee: Secondary | ICD-10-CM | POA: Diagnosis not present

## 2017-09-05 DIAGNOSIS — M25562 Pain in left knee: Secondary | ICD-10-CM | POA: Diagnosis not present

## 2017-09-15 ENCOUNTER — Telehealth: Payer: Self-pay | Admitting: Pulmonary Disease

## 2017-09-15 NOTE — Telephone Encounter (Signed)
Echo 09/01/17 >> EF 65 to 70%, mild AS, severe MV calcification  Results d/w pt.

## 2017-09-20 ENCOUNTER — Encounter: Payer: Self-pay | Admitting: Pulmonary Disease

## 2017-09-20 ENCOUNTER — Ambulatory Visit (INDEPENDENT_AMBULATORY_CARE_PROVIDER_SITE_OTHER): Payer: Medicare Other | Admitting: Pulmonary Disease

## 2017-09-20 VITALS — BP 108/60 | HR 82 | Ht 61.0 in | Wt 267.0 lb

## 2017-09-20 DIAGNOSIS — R918 Other nonspecific abnormal finding of lung field: Secondary | ICD-10-CM | POA: Diagnosis not present

## 2017-09-20 DIAGNOSIS — G4733 Obstructive sleep apnea (adult) (pediatric): Secondary | ICD-10-CM

## 2017-09-20 DIAGNOSIS — J449 Chronic obstructive pulmonary disease, unspecified: Secondary | ICD-10-CM | POA: Diagnosis not present

## 2017-09-20 DIAGNOSIS — Z9989 Dependence on other enabling machines and devices: Secondary | ICD-10-CM

## 2017-09-20 DIAGNOSIS — J439 Emphysema, unspecified: Secondary | ICD-10-CM

## 2017-09-20 MED ORDER — MONTELUKAST SODIUM 10 MG PO TABS: 10.0000 mg | ORAL_TABLET | Freq: Every day | ORAL | 3 refills | Status: DC

## 2017-09-20 NOTE — Progress Notes (Signed)
[name]  Chief Complaint  Patient presents with  . Follow-up    Pt has SOB with exertion. Pt states she has swelling in legs and hands in last week; gained 3lbs since last week.    Vital signs: BP 108/60 (BP Location: Left Arm, Cuff Size: Normal)   Pulse 82   Ht 5\' 1"  (1.549 m)   Wt 267 lb (121.1 kg)   SpO2 96%   BMI 50.45 kg/m   History of Present Illness: [name] is a 67 y.o. female former smoker with obstructive sleep apnea and COPD.  Breathing better.  Not having as much cough.  No wheeze, sputum, chest pain, fever, or hemoptysis.  Has more ankle swelling today.  Joints okay.  Trelegy working well.  Uses CPAP nightly.  Hasn't resumed MTX.  Still on remicade.  Physical Exam:  General - pleasant Eyes - pupils reactive ENT - no sinus tenderness, no oral exudate, no LAN Cardiac - regular, no murmur Chest - no wheeze, rales Abd - soft, non tender Ext - no edema Skin - no rashes Neuro - normal strength Psych - normal mood  Assessment/Plan:  COPD with chronic bronchitis. - improved after recent course of ABx - continue trelegy - prn albuterol  Lung nodules. - likely infectious - f/u CT chest w/o contrast in August 2019  Obstructive sleep apnea. - she is compliant - continue CPAP 8 cm H2O  Obesity. - discussed importance of weight loss  Rheumatoid arthritis. - okay to resume MTX if deemed necessary by Dr. Amil Amen   Patient Instructions  Will schedule CT chest for August 2019 and follow up after that    [name], MD [name] 09/20/2017, 12:18 PM Pager:  [phone]  Flow Sheet  Sleep tests: PSG 09/02/07 >> AHI 9 CPAP 07/08/17 to 08/06/17 >> used on 27 of 30 nights with average 8 hrs 30 min.  Average AHI 0.9 with CPAP 10 cm H2O  Pulmonary tests: PFT 09/18/07 >> FEV1 1.38 (61%), FEV1% 59, TLC 4.48 (95%), DLCO 80%, + BD PFT 08/09/17 >> FEV1 1.35 (61%), FEV1% 69, TLC 4.67 (98%),  DLCO 97% HRCT chest 08/17/17 >> calcification RV, atherosclerosis, scarring, mild air trapping, micronodularity LLL, 1.4 cm nodule LUL, fatty liver  Cardiac tests: Echo 09/01/17 >> EF 65 to 70%, mild AS, severe MV calcification  Past Medical History: She  has a past medical history of Asthma, COPD (chronic obstructive pulmonary disease) ([place]), Depression, DM type 1 (diabetes mellitus, type 1) ([place]), GERD (gastroesophageal reflux disease), Hyperlipidemia, Hypertension, Morbid obesity ([place]), Obstructive sleep apnea syndrome, Osteoarthritis of right knee (04/2010), Rheumatoid arthritis(714.0), and Septic arthritis of knee, right ([place]) (04/2010).  Past Surgical History: She  has a past surgical history that includes Total knee arthroplasty (05/07/2010); Revision total knee arthroplasty (06/09/2010); Appendectomy (1967); Abdominal hysterectomy (1992); Cholecystectomy (1992); Aorto-pulmonary window repair (1992); Hernia repair (2001); Hernia repair (2007); Abdominal wall mesh  removal (08/13/2009); Foreign body removal abdominal (01/04/2010); Colonoscopy (12/08/2011); and Bone biopsy (Right, 04/19/2016).  Family History: Her family history includes Arthritis in her father; COPD in her father; Cancer in her mother; Diabetes in her father and mother; Hyperlipidemia in her brother and mother; Hypertension in her brother.  Social History: She  reports that she quit smoking about 25 years ago. Her smoking use included cigarettes. She has a 50.00 pack-year smoking history. She has never used smokeless tobacco. She reports that she does not drink alcohol or use drugs.  Medications: Allergies  as of 09/20/2017      Reactions   Demerol Nausea Only   Severe   Hydromorphone Hcl Itching   All over the body.   Tetracycline Hives      Medication List        Accurate as of 09/20/17 12:18 PM. Always use your most recent med list.          ALPRAZolam 0.5 MG tablet Commonly known as:  XANAX Take 0.5 mg by mouth 3  (three) times daily as needed for sleep or anxiety.   aspirin 81 MG tablet Take 81 mg by mouth daily.   BENICAR 40 MG tablet Generic drug:  olmesartan Take 40 mg by mouth daily.   BYDUREON 2 MG Pen Generic drug:  Exenatide ER Inject 2 mg into the muscle once a week.   CALCIUM 600+D 600-400 MG-UNIT tablet Generic drug:  Calcium Carbonate-Vitamin D Take 1 tablet by mouth 2 (two) times daily.   cetirizine 10 MG tablet Commonly known as:  ZYRTEC Take 10 mg by mouth daily.   empagliflozin 25 MG Tabs tablet Commonly known as:  JARDIANCE Take 25 mg by mouth daily.   etodolac 400 MG tablet Commonly known as:  LODINE Take 400 mg by mouth 2 (two) times daily.   fish oil-omega-3 fatty acids 1000 MG capsule Take 2 g by mouth every evening.   Fluticasone-Umeclidin-Vilant 100-62.5-25 MCG/INH Aepb Commonly known as:  TRELEGY ELLIPTA Inhale 1 puff into the lungs daily.   folic acid 1 MG tablet Commonly known as:  FOLVITE Take 1 mg by mouth daily.   furosemide 40 MG tablet Commonly known as:  LASIX Take 40 mg by mouth daily as needed for fluid. Fluid   ibuprofen 200 MG tablet Commonly known as:  ADVIL,MOTRIN Take 400-600 mg by mouth every 8 (eight) hours as needed. Pain   ICAPS MV PO Take 1 tablet by mouth 2 (two) times daily.   insulin aspart 100 UNIT/ML injection Commonly known as:  novoLOG Inject 10-15 Units into the skin 2 (two) times daily with a meal.   LANTUS 100 UNIT/ML injection Generic drug:  insulin glargine Inject 80 Units into the skin 2 (two) times daily.   metFORMIN 500 MG 24 hr tablet Commonly known as:  GLUCOPHAGE-XR Take 2,000 mg by mouth daily.   methocarbamol 750 MG tablet Commonly known as:  ROBAXIN Take 750 mg by mouth every 8 (eight) hours as needed. Muscle Spasms   methotrexate 2.5 MG tablet Commonly known as:  RHEUMATREX Take 20 mg by mouth once a week. Caution:Chemotherapy. Protect from light.   montelukast 10 MG tablet Commonly known  as:  SINGULAIR Take 1 tablet (10 mg total) by mouth at bedtime.   omeprazole 40 MG capsule Commonly known as:  PRILOSEC TAKE 1 CAPSULE EVERY MORNING   PROAIR HFA 108 (90 Base) MCG/ACT inhaler Generic drug:  albuterol INHALE 2 PUFFS INTO THE LUNGS EVERY 6 HOURS AS NEEDED FOR WHEEZING OR SHORTNESS OF BREATH   albuterol (2.5 MG/3ML) 0.083% nebulizer solution Commonly known as:  PROVENTIL Take 3 mLs (2.5 mg total) by nebulization every 6 (six) hours as needed for wheezing or shortness of breath (dx: J43.9).   REMICADE IV Inject 1 Dose into the vein every 6 (six) weeks. Every 8 weeks   simvastatin 40 MG tablet Commonly known as:  ZOCOR Take 40 mg by mouth daily with supper.

## 2017-09-20 NOTE — Patient Instructions (Signed)
Will schedule CT chest for August 2019 and follow up after that

## 2017-09-22 DIAGNOSIS — J4521 Mild intermittent asthma with (acute) exacerbation: Secondary | ICD-10-CM | POA: Diagnosis not present

## 2017-09-22 DIAGNOSIS — F329 Major depressive disorder, single episode, unspecified: Secondary | ICD-10-CM | POA: Diagnosis not present

## 2017-09-25 DIAGNOSIS — H43813 Vitreous degeneration, bilateral: Secondary | ICD-10-CM | POA: Diagnosis not present

## 2017-09-25 DIAGNOSIS — H353134 Nonexudative age-related macular degeneration, bilateral, advanced atrophic with subfoveal involvement: Secondary | ICD-10-CM | POA: Diagnosis not present

## 2017-09-25 DIAGNOSIS — E119 Type 2 diabetes mellitus without complications: Secondary | ICD-10-CM | POA: Diagnosis not present

## 2017-09-25 DIAGNOSIS — Z794 Long term (current) use of insulin: Secondary | ICD-10-CM | POA: Diagnosis not present

## 2017-10-02 DIAGNOSIS — M545 Low back pain: Secondary | ICD-10-CM | POA: Diagnosis not present

## 2017-10-02 DIAGNOSIS — E1142 Type 2 diabetes mellitus with diabetic polyneuropathy: Secondary | ICD-10-CM | POA: Diagnosis not present

## 2017-10-02 DIAGNOSIS — L97511 Non-pressure chronic ulcer of other part of right foot limited to breakdown of skin: Secondary | ICD-10-CM | POA: Diagnosis not present

## 2017-10-02 DIAGNOSIS — Z79899 Other long term (current) drug therapy: Secondary | ICD-10-CM | POA: Diagnosis not present

## 2017-10-02 DIAGNOSIS — Z6841 Body Mass Index (BMI) 40.0 and over, adult: Secondary | ICD-10-CM | POA: Diagnosis not present

## 2017-10-02 DIAGNOSIS — M0589 Other rheumatoid arthritis with rheumatoid factor of multiple sites: Secondary | ICD-10-CM | POA: Diagnosis not present

## 2017-10-02 DIAGNOSIS — M150 Primary generalized (osteo)arthritis: Secondary | ICD-10-CM | POA: Diagnosis not present

## 2017-10-03 DIAGNOSIS — M0589 Other rheumatoid arthritis with rheumatoid factor of multiple sites: Secondary | ICD-10-CM | POA: Diagnosis not present

## 2017-10-09 DIAGNOSIS — E1142 Type 2 diabetes mellitus with diabetic polyneuropathy: Secondary | ICD-10-CM | POA: Diagnosis not present

## 2017-10-09 DIAGNOSIS — L97511 Non-pressure chronic ulcer of other part of right foot limited to breakdown of skin: Secondary | ICD-10-CM | POA: Diagnosis not present

## 2017-10-11 ENCOUNTER — Encounter (HOSPITAL_COMMUNITY): Payer: Self-pay | Admitting: *Deleted

## 2017-10-16 ENCOUNTER — Encounter (HOSPITAL_COMMUNITY): Payer: Self-pay | Admitting: *Deleted

## 2017-10-16 ENCOUNTER — Ambulatory Visit (HOSPITAL_COMMUNITY): Payer: Medicare Other | Admitting: Certified Registered Nurse Anesthetist

## 2017-10-16 ENCOUNTER — Encounter (HOSPITAL_COMMUNITY)
Admission: RE | Disposition: A | Discharge status: Home / Self Care | Payer: Self-pay | Source: Ambulatory Visit | Attending: Gastroenterology

## 2017-10-16 ENCOUNTER — Other Ambulatory Visit: Payer: Self-pay

## 2017-10-16 ENCOUNTER — Ambulatory Visit (HOSPITAL_COMMUNITY)
Admission: RE | Admit: 2017-10-16 | Discharge: 2017-10-16 | Disposition: A | Discharge status: Home / Self Care | Payer: Medicare Other | Source: Ambulatory Visit | Attending: Gastroenterology | Admitting: Gastroenterology

## 2017-10-16 DIAGNOSIS — Z09 Encounter for follow-up examination after completed treatment for conditions other than malignant neoplasm: Secondary | ICD-10-CM | POA: Diagnosis not present

## 2017-10-16 DIAGNOSIS — Z860100 Personal history of colon polyps, unspecified: Secondary | ICD-10-CM

## 2017-10-16 DIAGNOSIS — D123 Benign neoplasm of transverse colon: Secondary | ICD-10-CM | POA: Insufficient documentation

## 2017-10-16 DIAGNOSIS — Z96651 Presence of right artificial knee joint: Secondary | ICD-10-CM | POA: Diagnosis not present

## 2017-10-16 DIAGNOSIS — I10 Essential (primary) hypertension: Secondary | ICD-10-CM | POA: Diagnosis not present

## 2017-10-16 DIAGNOSIS — D124 Benign neoplasm of descending colon: Secondary | ICD-10-CM

## 2017-10-16 DIAGNOSIS — D122 Benign neoplasm of ascending colon: Secondary | ICD-10-CM | POA: Insufficient documentation

## 2017-10-16 DIAGNOSIS — E119 Type 2 diabetes mellitus without complications: Secondary | ICD-10-CM | POA: Diagnosis not present

## 2017-10-16 DIAGNOSIS — M069 Rheumatoid arthritis, unspecified: Secondary | ICD-10-CM | POA: Diagnosis not present

## 2017-10-16 DIAGNOSIS — J439 Emphysema, unspecified: Secondary | ICD-10-CM

## 2017-10-16 DIAGNOSIS — K432 Incisional hernia without obstruction or gangrene: Secondary | ICD-10-CM

## 2017-10-16 DIAGNOSIS — K6289 Other specified diseases of anus and rectum: Secondary | ICD-10-CM | POA: Insufficient documentation

## 2017-10-16 DIAGNOSIS — Z87891 Personal history of nicotine dependence: Secondary | ICD-10-CM | POA: Insufficient documentation

## 2017-10-16 DIAGNOSIS — Z794 Long term (current) use of insulin: Secondary | ICD-10-CM

## 2017-10-16 DIAGNOSIS — J449 Chronic obstructive pulmonary disease, unspecified: Secondary | ICD-10-CM | POA: Insufficient documentation

## 2017-10-16 DIAGNOSIS — K5730 Diverticulosis of large intestine without perforation or abscess without bleeding: Secondary | ICD-10-CM | POA: Insufficient documentation

## 2017-10-16 HISTORY — PX: COLONOSCOPY WITH PROPOFOL: SHX5780

## 2017-10-16 HISTORY — PX: POLYPECTOMY: SHX5525

## 2017-10-16 LAB — GLUCOSE, CAPILLARY: Glucose-Capillary: 97 mg/dL (ref 70–99)

## 2017-10-16 SURGERY — COLONOSCOPY WITH PROPOFOL
Anesthesia: Monitor Anesthesia Care

## 2017-10-16 MED ORDER — PROPOFOL 10 MG/ML IV BOLUS
INTRAVENOUS | Status: AC
  Filled 2017-10-16: qty 40

## 2017-10-16 MED ORDER — PROPOFOL 10 MG/ML IV BOLUS
INTRAVENOUS | Status: AC
  Filled 2017-10-16: qty 20

## 2017-10-16 MED ORDER — PROPOFOL 10 MG/ML IV BOLUS
INTRAVENOUS | Status: DC | PRN
  Administered 2017-10-16 (×4): 20 mg via INTRAVENOUS

## 2017-10-16 MED ORDER — PROPOFOL 500 MG/50ML IV EMUL
INTRAVENOUS | Status: DC | PRN
  Administered 2017-10-16: 200 ug/kg/min via INTRAVENOUS

## 2017-10-16 MED ORDER — ONDANSETRON HCL 4 MG/2ML IJ SOLN
INTRAMUSCULAR | Status: DC | PRN
  Administered 2017-10-16: 4 mg via INTRAVENOUS

## 2017-10-16 MED ORDER — LACTATED RINGERS IV SOLN
INTRAVENOUS | Status: DC
  Administered 2017-10-16: 1000 mL via INTRAVENOUS

## 2017-10-16 MED ORDER — SODIUM CHLORIDE 0.9 % IV SOLN: INTRAVENOUS | Status: DC

## 2017-10-16 SURGICAL SUPPLY — 22 items

## 2017-10-16 NOTE — Transfer of Care (Signed)
Immediate Anesthesia Transfer of Care Note  Patient: [name]  Procedure(s) Performed: COLONOSCOPY WITH PROPOFOL (N/A ) POLYPECTOMY  Patient Location: PACU  Anesthesia Type:MAC  Level of Consciousness: awake, alert  and oriented  Airway & Oxygen Therapy: Patient Spontanous Breathing and Patient connected to face mask oxygen  Post-op Assessment: Report given to RN and Post -op Vital signs reviewed and stable  Post vital signs: Reviewed and stable  Last Vitals:  Vitals Value Taken Time  BP    Temp    Pulse 89 10/16/2017 10:45 AM  Resp 17 10/16/2017 10:45 AM  SpO2 100 % 10/16/2017 10:45 AM  Vitals shown include unvalidated device data.  Last Pain:  Vitals:   10/16/17 0905  TempSrc: Oral  PainSc: 0-No pain         Complications: No apparent anesthesia complications

## 2017-10-16 NOTE — Interval H&P Note (Signed)
History and Physical Interval Note:  10/16/2017 10:00 AM  [name]  has presented today for surgery, with the diagnosis of Phx of colon polyps  The various methods of treatment have been discussed with the patient and family. After consideration of risks, benefits and other options for treatment, the patient has consented to  Procedure(s): COLONOSCOPY WITH PROPOFOL (N/A) as a surgical intervention .  The patient's history has been reviewed, patient examined, no change in status, stable for surgery.  I have reviewed the patient's chart and labs.  Questions were answered to the patient's satisfaction.     Nelida Meuse III

## 2017-10-16 NOTE — Discharge Instructions (Signed)
Do not take aspirin for the next 5 days.    YOU HAD AN ENDOSCOPIC PROCEDURE TODAY: Refer to the procedure report and other information in the discharge instructions given to you for any specific questions about what was found during the examination. If this information does not answer your questions, please call [place] office at [phone] to clarify.   YOU SHOULD EXPECT: Some feelings of bloating in the abdomen. Passage of more gas than usual. Walking can help get rid of the air that was put into your GI tract during the procedure and reduce the bloating. If you had a lower endoscopy (such as a colonoscopy or flexible sigmoidoscopy) you may notice spotting of blood in your stool or on the toilet paper. Some abdominal soreness may be present for a day or two, also.  DIET: Your first meal following the procedure should be a light meal and then it is ok to progress to your normal diet. A half-sandwich or bowl of soup is an example of a good first meal. Heavy or fried foods are harder to digest and may make you feel nauseous or bloated. Drink plenty of fluids but you should avoid alcoholic beverages for 24 hours. If you had a esophageal dilation, please see attached instructions for diet.    ACTIVITY: Your care partner should take you home directly after the procedure. You should plan to take it easy, moving slowly for the rest of the day. You can resume normal activity the day after the procedure however YOU SHOULD NOT DRIVE, use power tools, machinery or perform tasks that involve climbing or major physical exertion for 24 hours (because of the sedation medicines used during the test).   SYMPTOMS TO REPORT IMMEDIATELY: A gastroenterologist can be reached at any hour. Please call [phone]  for any of the following symptoms:  Following lower endoscopy (colonoscopy, flexible sigmoidoscopy) Excessive amounts of blood in the stool  Significant tenderness, worsening of abdominal pains  Swelling of  the abdomen that is new, acute  Fever of 100 or higher  Following upper endoscopy (EGD, EUS, ERCP, esophageal dilation) Vomiting of blood or coffee ground material  New, significant abdominal pain  New, significant chest pain or pain under the shoulder blades  Painful or persistently difficult swallowing  New shortness of breath  Black, tarry-looking or red, bloody stools  FOLLOW UP:  If any biopsies were taken you will be contacted by phone or by letter within the next 1-3 weeks. Call [phone]  if you have not heard about the biopsies in 3 weeks.  Please also call with any specific questions about appointments or follow up tests.

## 2017-10-16 NOTE — Anesthesia Preprocedure Evaluation (Signed)
Anesthesia Evaluation  Patient identified by MRN, date of birth, ID band Patient awake    Reviewed: Allergy & Precautions, NPO status , Patient's Chart, lab work & pertinent test results  Airway Mallampati: II       Dental no notable dental hx. (+) Teeth Intact   Pulmonary former smoker,    Pulmonary exam normal breath sounds clear to auscultation       Cardiovascular hypertension, Pt. on medications Normal cardiovascular exam Rate:Normal     Neuro/Psych    GI/Hepatic GERD  Medicated,  Endo/Other  diabetes, Type 2, Insulin DependentMorbid obesity  Renal/GU   negative genitourinary   Musculoskeletal   Abdominal (+) + obese,   Peds  Hematology   Anesthesia Other Findings OLUCHI [name]  ECHO COMPLETE WO IMAGING ENHANCING AGENT  Order# [id number]  Reading physician: Jerline Pain, MD Ordering physician: [name], MD Study date: 09/01/17 Study Result   Result status: Final result                          [name] [facility]                        [address], [place] [address]                            [phone]  ------------------------------------------------------------------- Transthoracic Echocardiography  Patient:    [name], [name] MR #:       [id number] Study Date: 09/01/2017 Gender:     F Age:        67 Height:     157.5 cm Weight:     118.8 kg BSA:        2.35 m^2 Pt. Status: Room:   ATTENDING    [name] [id number]  [id number]     [facility], [place], [place]  SONOGRAPHER  Wyatt Mage, RDCS  PERFORMING   [facility], Outpatient  cc:  ------------------------------------------------------------------- LV EF: 65% -   70%  ------------------------------------------------------------------- Indications:      Shortness of breath (R06.02).  -------------------------------------------------------------------    Reproductive/Obstetrics                             Anesthesia Physical Anesthesia Plan  ASA: III  Anesthesia Plan:    Post-op Pain Management:    Induction:   PONV Risk Score and Plan: 2 and Ondansetron  Airway Management Planned: Natural Airway, Nasal Cannula and Simple Face Mask  Additional Equipment:   Intra-op Plan:   Post-operative Plan:   Informed Consent: I have reviewed the patients History and Physical, chart, labs and discussed the procedure including the risks, benefits and alternatives for the proposed anesthesia with the patient or authorized representative who has indicated his/her understanding and acceptance.     Plan Discussed with: CRNA and Surgeon  Anesthesia Plan Comments:         Anesthesia Quick Evaluation

## 2017-10-16 NOTE — H&P (Signed)
History:  This patient presents for endoscopic testing for history of colon polyps.  [name] Referring physician: Asencion Noble, MD  Past Medical History: Past Medical History:  Diagnosis Date  . Asthma   . COPD (chronic obstructive pulmonary disease) ([place])   . Depression   . DM type 1 (diabetes mellitus, type 1) ([place])   . GERD (gastroesophageal reflux disease)   . Hyperlipidemia   . Hypertension   . Morbid obesity ([place])   . Obstructive sleep apnea syndrome   . Osteoarthritis of right knee 04/2010   end-stage  . Rheumatoid arthritis(714.0)    on methotrexate therapy  . Septic arthritis of knee, right ([place]) 04/2010     Past Surgical History: Past Surgical History:  Procedure Laterality Date  . ABDOMINAL HYSTERECTOMY  1992  . ABDOMINAL WALL MESH  REMOVAL  08/13/2009   with removal of retained stitches  . [place]   for cardiac tamponade  . APPENDECTOMY  1967  . BONE BIOPSY Right 04/19/2016   Procedure: BONE BIOPSY RIGHT GREAT TOE;  Surgeon: Caprice Beaver, DPM;  Location: [facility];  Service: Podiatry;  Laterality: Right;  . CHOLECYSTECTOMY  1992   laparoscopic  . COLONOSCOPY  12/08/2011   Procedure: COLONOSCOPY;  Surgeon: Rogene Houston, MD;  Location: [facility];  Service: Endoscopy;  Laterality: N/A;  830  . FOREIGN BODY REMOVAL ABDOMINAL  01/04/2010   stitch abscesses  . HERNIA REPAIR  2001   open LOA / [place] repair w mesh.  Dr. Arnoldo Morale  . HERNIA REPAIR  2007   open redo LOA / Kamas repair w mesh.  Dr. Arnoldo Morale  . REVISION TOTAL KNEE ARTHROPLASTY  06/09/2010   I and D   . TOTAL KNEE ARTHROPLASTY  05/07/2010   right knee    Allergies: Allergies  Allergen Reactions  . Demerol Nausea And Vomiting    Severe  . Hydromorphone Hcl Itching    All over the body.  . Tetracycline Hives    Outpatient Meds: Current Facility-Administered Medications  Medication Dose Route Frequency Provider Last Rate Last Dose  . 0.9 %  sodium chloride  infusion   Intravenous Continuous Danis, Estill Cotta III, MD      . lactated ringers infusion   Intravenous Continuous Nelida Meuse III, MD 10 mL/hr at 10/16/17 0925 1,000 mL at 10/16/17 0925      ___________________________________________________________________ Objective   Exam:  BP (!) 124/6   Pulse 90   Temp 98.3 F (36.8 C) (Oral)   Resp 20   Ht 5\' 1"  (1.549 m)   Wt 267 lb (121.1 kg)   SpO2 94%   BMI 50.45 kg/m    CV: RRR without murmur, S1/S2, no JVD, no peripheral edema  Resp: generally decreased breath sounds bilaterally, normal RR and effort noted  GI: soft, no tenderness, with active bowel sounds. No guarding or palpable organomegaly noted.Large abdominal wall hernia  Neuro: awake, alert and oriented x 3. Normal gross motor function and fluent speech   Assessment:  Hx colon polyp (tubular adenoma 2013)  Plan:  colonoscopy   Nelida Meuse III

## 2017-10-16 NOTE — Op Note (Signed)
[facility] Patient Name: [name] Procedure Date: 10/16/2017 MRN: [id number] Attending MD: Estill Cotta. Loletha Carrow , MD Date of Birth: Oct 22, 1950 CSN: [id number] Age: 67 Admit Type: Outpatient Procedure:                Colonoscopy Indications:              Surveillance: Personal history of adenomatous                            polyps on last colonoscopy > 5 years ago (cecal                            adenomatous polyp 2013) Providers:                Estill Cotta. Loletha Carrow, MD, Cleda Daub, RN, William Dalton, Technician Referring MD:              Medicines:                 Complications:            No immediate complications. Estimated Blood Loss:     Estimated blood loss was minimal. Procedure:                Pre-Anesthesia Assessment:                           - Prior to the procedure, a History and Physical                            was performed, and patient medications and                            allergies were reviewed. The patient's tolerance of                            previous anesthesia was also reviewed. The risks                            and benefits of the procedure and the sedation                            options and risks were discussed with the patient.                            All questions were answered, and informed consent                            was obtained. Anticoagulants: The patient has taken                            aspirin. It was decided not to withhold this                            medication  prior to the procedure. ASA Grade                            Assessment: III - A patient with severe systemic                            disease. After reviewing the risks and benefits,                            the patient was deemed in satisfactory condition to                            undergo the procedure.                           After obtaining informed consent, the colonoscope                             was passed under direct vision. Throughout the                            procedure, the patient's blood pressure, pulse, and                            oxygen saturations were monitored continuously. The                            EC-[id number] ([id number]) scope was introduced through                            the anus and advanced to the the cecum, identified                            by appendiceal orifice and ileocecal valve. The                            colonoscopy was performed with difficulty due to                            poor bowel prep, poor endoscopic visualization and                            the patient's body habitus and large abdominal wall                            hernia. [Solution]. The patient tolerated the                            procedure. The quality of the bowel preparation was                            poor. The ileocecal valve, appendiceal orifice, and  rectum were photographed. The quality of the bowel                            preparation was evaluated using the BBPS [facility]                            Bowel Preparation Scale) with scores of: Right                            Colon = 1, Transverse Colon = 1 and Left Colon = 1.                            The total BBPS score equals 3. Scope In: 10:12:43 AM Scope Out: 10:38:47 AM Scope Withdrawal Time: 0 hours 18 minutes 50 seconds  Total Procedure Duration: 0 hours 26 minutes 4 seconds  Findings:      The digital rectal exam findings include decreased sphincter tone.      A moderate amount of liquid stool was found in the entire colon,       interfering with visualization. There was also a large amount of fibrous       food debris (broccoli) throughout the colon, especially in the right       colon.      A 8 mm polyp was found in the ascending colon. The polyp was sessile.       The polyp was removed with a cold snare. Resection and retrieval were       complete. To stop active  bleeding, one hemostatic clip was successfully       placed (MR conditional). There was no bleeding at the end of the       procedure.      A 6 mm polyp was found in the transverse colon. The polyp was sessile.       The polyp was removed with a cold snare. Resection and retrieval were       complete.      A 10 mm polyp was found in the transverse colon. The polyp was sessile.       The polyp was removed with a hot snare. Resection and retrieval were       complete.      A 6 mm polyp was found in the descending colon. The polyp was sessile.       The polyp was removed with a cold snare. Resection and retrieval were       complete.      Multiple small-mouthed diverticula were found in the left colon.      The exam was otherwise without abnormality on direct and retroflexion       views, given the significant limitations of the preparation. Impression:               - Preparation of the colon was poor.                           - Decreased sphincter tone found on digital rectal                            exam.                           -  One 8 mm polyp in the ascending colon, removed                            with a cold snare. Resected and retrieved. Clip (MR                            conditional) was placed.                           - One 6 mm polyp in the transverse colon, removed                            with a cold snare. Resected and retrieved.                           - One 10 mm polyp in the transverse colon, removed                            with a hot snare. Resected and retrieved.                           - One 6 mm polyp in the descending colon, removed                            with a cold snare. Resected and retrieved.                           - Diverticulosis in the left colon.                           - The examination was otherwise normal on direct                            and retroflexion views. Moderate Sedation:      N/A- Per Anesthesia  Care Recommendation:           - Patient has a contact number available for                            emergencies. The signs and symptoms of potential                            delayed complications were discussed with the                            patient. Return to normal activities tomorrow.                            Written discharge instructions were provided to the                            patient.                           -  Resume previous diet.                           - No aspirin, ibuprofen, naproxen, or other                            non-steroidal anti-inflammatory drugs for 5 days                            after polyp removal.                           - Await pathology results.                           - Repeat colonoscopy in 1 year because the bowel                            preparation was poor and for surveillance. Procedure Code(s):        --- Professional ---                           [phone], Colonoscopy, flexible; with removal of                            tumor(s), polyp(s), or other lesion(s) by snare                            technique Diagnosis Code(s):        --- Professional ---                           Z86.010, Personal history of colonic polyps                           K62.89, Other specified diseases of anus and rectum                           D12.2, Benign neoplasm of ascending colon                           D12.3, Benign neoplasm of transverse colon (hepatic                            flexure or splenic flexure)                           D12.4, Benign neoplasm of descending colon                           K57.30, Diverticulosis of large intestine without                            perforation or abscess without bleeding CPT copyright 2017 American Medical Association. All rights reserved. The codes documented in this report are preliminary and upon coder review may  be revised to meet  current compliance requirements. Sandeep Delagarza L. Loletha Carrow,  MD 10/16/2017 10:49:16 AM This report has been signed electronically. Number of Addenda: 0

## 2017-10-16 NOTE — Anesthesia Postprocedure Evaluation (Signed)
Anesthesia Post Note  Patient: [name]  Procedure(s) Performed: COLONOSCOPY WITH PROPOFOL (N/A ) POLYPECTOMY     Patient location during evaluation: Endoscopy Anesthesia Type: MAC Level of consciousness: awake and sedated Pain management: pain level controlled Vital Signs Assessment: post-procedure vital signs reviewed and stable Respiratory status: spontaneous breathing Cardiovascular status: stable Postop Assessment: no apparent nausea or vomiting Anesthetic complications: no    Last Vitals:  Vitals:   10/16/17 1055 10/16/17 1105  BP: (!) 112/54 (!) 119/56  Pulse: 91   Resp: 20 (!) 21  Temp:    SpO2: 95% 93%    Last Pain:  Vitals:   10/16/17 1105  TempSrc:   PainSc: 0-No pain   Pain Goal:                 [name] JR,JOHN Skarlet Lyons

## 2017-10-17 ENCOUNTER — Encounter (HOSPITAL_COMMUNITY): Payer: Self-pay | Admitting: Gastroenterology

## 2017-10-17 ENCOUNTER — Ambulatory Visit (INDEPENDENT_AMBULATORY_CARE_PROVIDER_SITE_OTHER): Payer: Medicare Other | Admitting: Endocrinology

## 2017-10-17 VITALS — BP 132/64 | HR 94 | Ht 61.0 in | Wt 272.4 lb

## 2017-10-17 DIAGNOSIS — E119 Type 2 diabetes mellitus without complications: Secondary | ICD-10-CM | POA: Diagnosis not present

## 2017-10-17 DIAGNOSIS — E0590 Thyrotoxicosis, unspecified without thyrotoxic crisis or storm: Secondary | ICD-10-CM | POA: Diagnosis not present

## 2017-10-17 DIAGNOSIS — I10 Essential (primary) hypertension: Secondary | ICD-10-CM

## 2017-10-17 DIAGNOSIS — Z794 Long term (current) use of insulin: Secondary | ICD-10-CM | POA: Diagnosis not present

## 2017-10-17 LAB — T4, FREE: Free T4: 0.81 ng/dL (ref 0.60–1.60)

## 2017-10-17 LAB — T3, FREE: T3 FREE: 3.6 pg/mL (ref 2.3–4.2)

## 2017-10-17 LAB — TSH: TSH: 0.02 u[IU]/mL — ABNORMAL LOW (ref 0.35–4.50)

## 2017-10-17 NOTE — Progress Notes (Signed)
Patient ID: [name], female   DOB: March 08, 1951, 67 y.o.   MRN: [id number]                                                                                                              Reason for Appointment: Abnormal thyroid, new consultation  Referring physician: Eleonore Chiquito   Chief complaint: Abnormal thyroid test   History of Present Illness:   She had a routine screening TSH test during her hospitalization for COPD in April Also at that time she was tachycardic, was apparently fairly ill at that time with her COPD exacerbation  She thinks she has no symptoms of palpitations, shakiness, unusually feeling warm or sweaty She tends to feel tired and has been feeling this way since the beginning of the year, she thinks this is from her having recurrent problems with respiratory illness.   The patient has lost about 24 lbs since earlier this year and she thinks this is from her cutting back on high fat foods and high calorie foods as well as cutting back on eating out Recently appears to have regained some weight  Wt Readings from Last 3 Encounters:  10/17/17 272 lb 6.4 oz (123.6 kg)  10/16/17 267 lb (121.1 kg)  09/20/17 267 lb (121.1 kg)     Treatments so far:  Thyroid function tests as follows:     Lab Results  Component Value Date   TSH 0.018 (L) 07/29/2017   TSH 0.33 (L) 08/21/2007    No results found for: THYROTRECAB   Allergies as of 10/17/2017      Reactions   Demerol Nausea And Vomiting   Severe   Hydromorphone Hcl Itching   All over the body.   Tetracycline Hives      Medication List        Accurate as of 10/17/17  4:08 PM. Always use your most recent med list.          ALPRAZolam 0.5 MG tablet Commonly known as:  XANAX Take 0.5 mg by mouth 3 (three) times daily as needed for sleep or anxiety.   ARTIFICIAL TEARS OP Place 1 drop into both eyes 4 (four) times daily.   aspirin 81 MG tablet Take 81 mg by mouth daily.   BENICAR 40 MG tablet Generic  drug:  olmesartan Take 40 mg by mouth daily.   BYDUREON 2 MG Pen Generic drug:  Exenatide ER Inject 2 mg into the muscle every Saturday.   calamine lotion Apply 1 application topically as needed (bug bites).   CALCIUM 600 + D PO Take 1 tablet by mouth 2 (two) times daily.   cetirizine 10 MG tablet Commonly known as:  ZYRTEC Take 10 mg by mouth at bedtime.   empagliflozin 25 MG Tabs tablet Commonly known as:  JARDIANCE Take 25 mg by mouth daily.   etodolac 400 MG tablet Commonly known as:  LODINE Take 400 mg by mouth 2 (two) times daily.   fish oil-omega-3 fatty acids 1000 MG capsule Take 2 g by  mouth every evening.   Fluticasone-Umeclidin-Vilant 100-62.5-25 MCG/INH Aepb Commonly known as:  TRELEGY ELLIPTA Inhale 1 puff into the lungs daily.   furosemide 40 MG tablet Commonly known as:  LASIX Take 40 mg by mouth daily as needed for fluid. Fluid   ibuprofen 200 MG tablet Commonly known as:  ADVIL,MOTRIN Take 400-600 mg by mouth every 8 (eight) hours as needed for moderate pain. Pain   ICAPS MV PO Take 1 tablet by mouth 2 (two) times daily.   insulin lispro 100 UNIT/ML injection Commonly known as:  HUMALOG Inject 15 Units into the skin 2 (two) times daily.   LANTUS 100 UNIT/ML injection Generic drug:  insulin glargine Inject 80 Units into the skin 2 (two) times daily.   metFORMIN 500 MG 24 hr tablet Commonly known as:  GLUCOPHAGE-XR Take 2,000 mg by mouth daily.   montelukast 10 MG tablet Commonly known as:  SINGULAIR Take 1 tablet (10 mg total) by mouth at bedtime.   omeprazole 40 MG capsule Commonly known as:  PRILOSEC TAKE 1 CAPSULE EVERY MORNING   PROAIR HFA 108 (90 Base) MCG/ACT inhaler Generic drug:  albuterol INHALE 2 PUFFS INTO THE LUNGS EVERY 6 HOURS AS NEEDED FOR WHEEZING OR SHORTNESS OF BREATH   albuterol (2.5 MG/3ML) 0.083% nebulizer solution Commonly known as:  PROVENTIL Take 3 mLs (2.5 mg total) by nebulization every 6 (six) hours as  needed for wheezing or shortness of breath (dx: J43.9).   REMICADE IV Inject 1 Dose into the vein every 6 (six) weeks.   sertraline 50 MG tablet Commonly known as:  ZOLOFT Take 50 mg by mouth daily. TAKE ONE TABLET BY MOUTH ONCE DAILY.   simvastatin 40 MG tablet Commonly known as:  ZOCOR Take 40 mg by mouth daily with supper.           Past Medical History:  Diagnosis Date  . Asthma   . COPD (chronic obstructive pulmonary disease) ([place])   . Depression   . DM type 1 (diabetes mellitus, type 1) ([place])   . GERD (gastroesophageal reflux disease)   . Hyperlipidemia   . Hypertension   . Morbid obesity ([place])   . Obstructive sleep apnea syndrome   . Osteoarthritis of right knee 04/2010   end-stage  . Rheumatoid arthritis(714.0)    on methotrexate therapy  . Septic arthritis of knee, right ([place]) 04/2010    Past Surgical History:  Procedure Laterality Date  . ABDOMINAL HYSTERECTOMY  1992  . ABDOMINAL WALL MESH  REMOVAL  08/13/2009   with removal of retained stitches  . [place]   for cardiac tamponade  . APPENDECTOMY  1967  . BONE BIOPSY Right 04/19/2016   Procedure: BONE BIOPSY RIGHT GREAT TOE;  Surgeon: Caprice Beaver, DPM;  Location: [facility];  Service: Podiatry;  Laterality: Right;  . CHOLECYSTECTOMY  1992   laparoscopic  . COLONOSCOPY  12/08/2011   Procedure: COLONOSCOPY;  Surgeon: Rogene Houston, MD;  Location: [facility];  Service: Endoscopy;  Laterality: N/A;  830  . COLONOSCOPY WITH PROPOFOL N/A 10/16/2017   Procedure: COLONOSCOPY WITH PROPOFOL;  Surgeon: Doran Stabler, MD;  Location: [facility];  Service: Gastroenterology;  Laterality: N/A;  . FOREIGN BODY REMOVAL ABDOMINAL  01/04/2010   stitch abscesses  . HERNIA REPAIR  2001   open LOA / [place] repair w mesh.  Dr. Arnoldo Morale  . HERNIA REPAIR  2007   open redo LOA / [place] repair w mesh.  Dr. Arnoldo Morale  .  POLYPECTOMY  10/16/2017   Procedure: POLYPECTOMY;  Surgeon: Doran Stabler, MD;   Location: [facility];  Service: Gastroenterology;;  . REVISION TOTAL KNEE ARTHROPLASTY  06/09/2010   I and D   . TOTAL KNEE ARTHROPLASTY  05/07/2010   right knee    Family History  Problem Relation Age of Onset  . Cancer Mother   . Hyperlipidemia Mother   . Diabetes Mother   . Diabetes Father   . COPD Father   . Arthritis Father        RA  . Hyperlipidemia Brother   . Hypertension Brother     Social History:  reports that she quit smoking about 25 years ago. Her smoking use included cigarettes. She has a 50.00 pack-year smoking history. She has never used smokeless tobacco. She reports that she does not drink alcohol or use drugs.  Allergies:  Allergies  Allergen Reactions  . Demerol Nausea And Vomiting    Severe  . Hydromorphone Hcl Itching    All over the body.  . Tetracycline Hives     Review of Systems  Constitutional: Positive for malaise.  HENT: Negative for trouble swallowing.   Eyes: Negative for blurred vision.  Respiratory: Positive for shortness of breath.   Cardiovascular: Positive for leg swelling.  Gastrointestinal: Positive for constipation.  Endocrine: Positive for fatigue.  Genitourinary: Negative for nocturia.  Musculoskeletal: Negative for joint pain.  Neurological: Negative for weakness and tremors.   She has been on insulin for her diabetes since 1992 Also on Jardiance, Bydureon and metformin  Recent A1c by PCP was 7.1 She is followed long-term by her PCP. No symptoms of hypoglycemia recently    Examination:   BP 132/64 (BP Location: Left Arm, Patient Position: Sitting, Cuff Size: Normal)   Pulse 94   Ht 5\' 1"  (1.549 m)   Wt 272 lb 6.4 oz (123.6 kg)   SpO2 96%   BMI 51.47 kg/m    General Appearance:  She has mild generalized obesity  She looks well, not anxious or hyperkinetic.         Eyes: No abnormal prominence, lid lag or stare present.  Has mild swelling of the eyelids   Neck: The thyroid is nonpalpable  There is no  lymphadenopathy in the neck .           Heart: normal S1 and S2, no murmurs .          Lungs: breath sounds are normal bilaterally without added sounds  Abdomen: no hepatosplenomegaly or other palpable abnormality   Extremities: hands are not unusually warm or sweaty. She has 1+ ankle edema.  Neurological: No tremors present  Deep tendon reflexes not elicited both at biceps and ankles   Skin: No rash, abnormal thickening of the skin on the lower legs seen     Assessment/Plan:  Subclinical Hyperthyroidism  She had a suppressed TSH when admitted for COPD exacerbation nearly 3 months ago Currently has no goiter on exam and no symptoms suggestive of hypothyroidism  Not clear if she has had subclinical hyperthyroidism or euthyroid sick syndrome when admitted No prior history of goiter or thyroid disease  We will check her thyroid panel today and decide on further management  Discussed that if she does have significantly abnormal thyroid levels will proceed with checking a thyrotropin receptor antibody If this is negative may also need to do a thyroid scan and uptake; given her history of hypertension also may be important to treat subclinical hyperthyroidism  to prevent atrial arrhythmias in the future  Discussed potential treatment for Graves' disease or toxic nodular goiter if we have to do this  Follow-up to be determined   DIABETES on multidrug regimen and basal bolus insulin: Followed by PCP, diabetes consultation has not been requested with last A1c 7.1.  Consult note sent to referring physician  Elayne Snare 10/17/2017, 4:08 PM    ADDENDUM: TSH is 0.02 but has normal free T4 and T3.  Will order thyrotropin receptor antibody and if negative consider scan   Note: This office note was prepared with Estate agent. Any transcriptional errors that result from this process are unintentional.

## 2017-10-18 ENCOUNTER — Encounter: Payer: Self-pay | Admitting: Gastroenterology

## 2017-10-18 DIAGNOSIS — E0590 Thyrotoxicosis, unspecified without thyrotoxic crisis or storm: Secondary | ICD-10-CM | POA: Diagnosis not present

## 2017-10-18 NOTE — Addendum Note (Signed)
Addended by: Kaylyn Lim I on: 10/18/2017 08:26 AM   Modules accepted: Orders

## 2017-10-19 LAB — THYROTROPIN RECEPTOR AUTOABS: THYROTROPIN RECEPTOR AB: 0.55 IU/L (ref 0.00–1.75)

## 2017-10-20 ENCOUNTER — Other Ambulatory Visit: Payer: Self-pay | Admitting: Endocrinology

## 2017-10-20 DIAGNOSIS — E0590 Thyrotoxicosis, unspecified without thyrotoxic crisis or storm: Secondary | ICD-10-CM

## 2017-10-23 DIAGNOSIS — E1142 Type 2 diabetes mellitus with diabetic polyneuropathy: Secondary | ICD-10-CM | POA: Diagnosis not present

## 2017-10-23 DIAGNOSIS — L97511 Non-pressure chronic ulcer of other part of right foot limited to breakdown of skin: Secondary | ICD-10-CM | POA: Diagnosis not present

## 2017-10-30 ENCOUNTER — Encounter (HOSPITAL_COMMUNITY)
Admission: RE | Admit: 2017-10-30 | Discharge: 2017-10-30 | Disposition: A | Discharge status: Home / Self Care | Payer: Medicare Other | Source: Ambulatory Visit | Attending: Endocrinology | Admitting: Endocrinology

## 2017-10-30 ENCOUNTER — Encounter (HOSPITAL_COMMUNITY): Payer: Self-pay

## 2017-10-30 DIAGNOSIS — E0590 Thyrotoxicosis, unspecified without thyrotoxic crisis or storm: Secondary | ICD-10-CM | POA: Insufficient documentation

## 2017-10-30 MED ORDER — SODIUM CHLORIDE 0.9% FLUSH
INTRAVENOUS | Status: AC
  Filled 2017-10-30: qty 200

## 2017-10-30 MED ORDER — SODIUM IODIDE I-123 7.4 MBQ CAPS
303.0000 | ORAL_CAPSULE | Freq: Once | ORAL | Status: AC
  Administered 2017-10-30: 303 via ORAL

## 2017-10-31 ENCOUNTER — Encounter (HOSPITAL_COMMUNITY)
Admission: RE | Admit: 2017-10-31 | Discharge: 2017-10-31 | Disposition: A | Discharge status: Home / Self Care | Payer: Medicare Other | Source: Ambulatory Visit | Attending: Endocrinology | Admitting: Endocrinology

## 2017-10-31 DIAGNOSIS — E0590 Thyrotoxicosis, unspecified without thyrotoxic crisis or storm: Secondary | ICD-10-CM | POA: Diagnosis not present

## 2017-11-02 NOTE — Progress Notes (Signed)
Letter had been sent out on 10/18/17.

## 2017-11-06 ENCOUNTER — Other Ambulatory Visit: Payer: Self-pay

## 2017-11-06 MED ORDER — PROPYLTHIOURACIL 50 MG PO TABS
ORAL_TABLET | ORAL | 3 refills | Status: DC
Start: 2017-11-06 — End: 2017-12-22

## 2017-11-09 ENCOUNTER — Other Ambulatory Visit: Payer: Self-pay | Admitting: Endocrinology

## 2017-11-09 DIAGNOSIS — E0590 Thyrotoxicosis, unspecified without thyrotoxic crisis or storm: Secondary | ICD-10-CM

## 2017-11-13 DIAGNOSIS — E1142 Type 2 diabetes mellitus with diabetic polyneuropathy: Secondary | ICD-10-CM | POA: Diagnosis not present

## 2017-11-13 DIAGNOSIS — L97512 Non-pressure chronic ulcer of other part of right foot with fat layer exposed: Secondary | ICD-10-CM | POA: Diagnosis not present

## 2017-11-14 DIAGNOSIS — M0589 Other rheumatoid arthritis with rheumatoid factor of multiple sites: Secondary | ICD-10-CM | POA: Diagnosis not present

## 2017-11-14 DIAGNOSIS — Z79899 Other long term (current) drug therapy: Secondary | ICD-10-CM | POA: Diagnosis not present

## 2017-11-20 DIAGNOSIS — L97511 Non-pressure chronic ulcer of other part of right foot limited to breakdown of skin: Secondary | ICD-10-CM | POA: Diagnosis not present

## 2017-11-20 DIAGNOSIS — E1142 Type 2 diabetes mellitus with diabetic polyneuropathy: Secondary | ICD-10-CM | POA: Diagnosis not present

## 2017-11-23 DIAGNOSIS — Z9989 Dependence on other enabling machines and devices: Principal | ICD-10-CM

## 2017-11-23 DIAGNOSIS — G4733 Obstructive sleep apnea (adult) (pediatric): Secondary | ICD-10-CM

## 2017-11-27 DIAGNOSIS — E1142 Type 2 diabetes mellitus with diabetic polyneuropathy: Secondary | ICD-10-CM | POA: Diagnosis not present

## 2017-11-27 DIAGNOSIS — L97511 Non-pressure chronic ulcer of other part of right foot limited to breakdown of skin: Secondary | ICD-10-CM | POA: Diagnosis not present

## 2017-11-29 ENCOUNTER — Ambulatory Visit (HOSPITAL_COMMUNITY)
Admission: RE | Admit: 2017-11-29 | Discharge: 2017-11-29 | Disposition: A | Discharge status: Home / Self Care | Payer: Medicare Other | Source: Ambulatory Visit | Attending: Pulmonary Disease | Admitting: Pulmonary Disease

## 2017-11-29 DIAGNOSIS — E1140 Type 2 diabetes mellitus with diabetic neuropathy, unspecified: Secondary | ICD-10-CM | POA: Diagnosis not present

## 2017-11-29 DIAGNOSIS — R918 Other nonspecific abnormal finding of lung field: Secondary | ICD-10-CM | POA: Insufficient documentation

## 2017-11-29 DIAGNOSIS — I700 Atherosclerosis of aorta: Secondary | ICD-10-CM | POA: Diagnosis not present

## 2017-11-29 DIAGNOSIS — Z79899 Other long term (current) drug therapy: Secondary | ICD-10-CM | POA: Diagnosis not present

## 2017-12-01 ENCOUNTER — Encounter: Payer: Self-pay | Admitting: Pulmonary Disease

## 2017-12-01 ENCOUNTER — Ambulatory Visit (INDEPENDENT_AMBULATORY_CARE_PROVIDER_SITE_OTHER): Payer: Medicare Other | Admitting: Pulmonary Disease

## 2017-12-01 VITALS — BP 124/76 | HR 92 | Ht 61.0 in | Wt 279.4 lb

## 2017-12-01 DIAGNOSIS — G4733 Obstructive sleep apnea (adult) (pediatric): Secondary | ICD-10-CM | POA: Diagnosis not present

## 2017-12-01 DIAGNOSIS — Z9989 Dependence on other enabling machines and devices: Secondary | ICD-10-CM

## 2017-12-01 DIAGNOSIS — J449 Chronic obstructive pulmonary disease, unspecified: Secondary | ICD-10-CM | POA: Diagnosis not present

## 2017-12-01 DIAGNOSIS — Z8739 Personal history of other diseases of the musculoskeletal system and connective tissue: Secondary | ICD-10-CM | POA: Diagnosis not present

## 2017-12-01 DIAGNOSIS — Z6841 Body Mass Index (BMI) 40.0 and over, adult: Secondary | ICD-10-CM | POA: Diagnosis not present

## 2017-12-01 NOTE — Patient Instructions (Signed)
Follow up in 6 months 

## 2017-12-01 NOTE — Progress Notes (Signed)
[facility]  Chief Complaint  Patient presents with  . Follow-up    follow up for CT scan results. Patient had a CT chest on 11/29/17.     Constitutional: BP 124/76 (BP Location: Left Arm, Patient Position: Sitting, Cuff Size: Normal)   Pulse 92   Ht 5\' 1"  (1.549 m)   Wt 279 lb 6.4 oz (126.7 kg)   SpO2 94%   BMI 52.79 kg/m   History of Present Illness: [name] is a 67 y.o. female former smoker with obstructive sleep apnea and COPD.  She had CT chest 11/29/17 (reviewed by me).  LUL nodule resolved.  No other worrisome findings.  Gets congested with weather change.  Not having sputum, fever, chest pain, sinus congestion, sore throat.  Joint pain is under control.  Remains on remicade.  No plans to resume MTX.  Uses CPAP nightly w/o difficulty.  Hasn't needed to use albuterol much.  Comprehensive Respiratory Exam:  Appearance - well kempt  ENMT - nasal mucosa moist, turbinates clear, midline nasal septum, no dental lesions, no gingival bleeding, no oral exudates, no tonsillar hypertrophy Neck - no masses, trachea midline, no thyromegaly, no elevation in JVP Respiratory - normal appearance of chest wall, normal respiratory effort w/o accessory muscle use, no dullness on percussion, no wheezing or rales CV - s1s2 regular rate and rhythm, no murmurs, no peripheral edema, radial pulses symmetric GI - soft, non tender, no masses Lymph - no adenopathy noted in neck and axillary areas MSK - normal muscle strength and tone, walks with a cane Ext - no cyanosis, clubbing, or joint inflammation noted Skin - no rashes, lesions, or ulcers Neuro - oriented to person, place, and time Psych - normal mood and affect   Assessment/Plan:  COPD with chronic bronchitis. - continue trelegy and prn albuterol  Lung nodules. - was probably infectious - resolved - no need for additional imaging studies at this time  Obstructive sleep apnea. - she is  compliant with CPAP and reports benefit - continue CPAP 8 cm H2O  Obesity. -- discussed options to assist with weight loss  Rheumatoid arthritis. - followed by Dr. Amil Amen with rheumatology   Patient Instructions  Follow up in 6 months    [name], MD [place] 12/01/2017, 2:23 PM Pager:  [phone]  Flow Sheet  Sleep tests: PSG 09/02/07 >> AHI 9 CPAP 07/08/17 to 08/06/17 >> used on 27 of 30 nights with average 8 hrs 30 min.  Average AHI 0.9 with CPAP 10 cm H2O  Pulmonary tests: PFT 09/18/07 >> FEV1 1.38 (61%), FEV1% 59, TLC 4.48 (95%), DLCO 80%, + BD PFT 08/09/17 >> FEV1 1.35 (61%), FEV1% 69, TLC 4.67 (98%), DLCO 97%  Chest imaging: HRCT chest 08/17/17 >> calcification RV, atherosclerosis, scarring, mild air trapping, micronodularity LLL, 1.4 cm nodule LUL, fatty liver CT chest 11/29/17 >> LUL nodule resolved  Cardiac tests: Echo 09/01/17 >> EF 65 to 70%, mild AS, severe MV calcification  Past Medical History: She  has a past medical history of Asthma, COPD (chronic obstructive pulmonary disease) ([place]), Depression, DM type 1 (diabetes mellitus, type 1) ([place]), GERD (gastroesophageal reflux disease), Hyperlipidemia, Hypertension, Morbid obesity ([place]), Obstructive sleep apnea syndrome, Osteoarthritis of right knee (04/2010), Rheumatoid arthritis(714.0), and Septic arthritis of knee, right ([place]) (04/2010).  Past Surgical History: She  has a past surgical history that includes Total knee arthroplasty (05/07/2010); Revision total knee arthroplasty (06/09/2010); Appendectomy (1967); Abdominal hysterectomy (1992); Cholecystectomy (1992); [place]o-pulmonary  window repair (1992); Hernia repair (2001); Hernia repair (2007); Abdominal wall mesh  removal (08/13/2009); Foreign body removal abdominal (01/04/2010); Colonoscopy (12/08/2011); Bone biopsy (Right, 04/19/2016); Colonoscopy with propofol (N/A, 10/16/2017); and polypectomy (10/16/2017).  Family History: Her family history  includes Arthritis in her father; COPD in her father; Cancer in her mother; Diabetes in her father and mother; Hyperlipidemia in her brother and mother; Hypertension in her brother; Thyroid disease in her mother.  Social History: She  reports that she quit smoking about 25 years ago. Her smoking use included cigarettes. She has a 50.00 pack-year smoking history. She has never used smokeless tobacco. She reports that she does not drink alcohol or use drugs.  Medications: Allergies as of 12/01/2017      Reactions   Demerol Nausea And Vomiting   Severe   Hydromorphone Hcl Itching   All over the body.   Tetracycline Hives      Medication List        Accurate as of 12/01/17  2:23 PM. Always use your most recent med list.          ALPRAZolam 0.5 MG tablet Commonly known as:  XANAX Take 0.5 mg by mouth 3 (three) times daily as needed for sleep or anxiety.   ARTIFICIAL TEARS OP Place 1 drop into both eyes 4 (four) times daily.   aspirin 81 MG tablet Take 81 mg by mouth daily.   BENICAR 40 MG tablet Generic drug:  olmesartan Take 40 mg by mouth daily.   BYDUREON 2 MG Pen Generic drug:  Exenatide ER Inject 2 mg into the muscle every Saturday.   calamine lotion Apply 1 application topically as needed (bug bites).   CALCIUM 600 + D PO Take 1 tablet by mouth 2 (two) times daily.   cetirizine 10 MG tablet Commonly known as:  ZYRTEC Take 10 mg by mouth at bedtime.   empagliflozin 25 MG Tabs tablet Commonly known as:  JARDIANCE Take 25 mg by mouth daily.   etodolac 400 MG tablet Commonly known as:  LODINE Take 400 mg by mouth 2 (two) times daily.   fish oil-omega-3 fatty acids 1000 MG capsule Take 2 g by mouth every evening.   Fluticasone-Umeclidin-Vilant 100-62.5-25 MCG/INH Aepb Inhale 1 puff into the lungs daily.   furosemide 40 MG tablet Commonly known as:  LASIX Take 40 mg by mouth daily as needed for fluid. Fluid   ibuprofen 200 MG tablet Commonly known as:   ADVIL,MOTRIN Take 400-600 mg by mouth every 8 (eight) hours as needed for moderate pain. Pain   ICAPS MV PO Take 1 tablet by mouth 2 (two) times daily.   insulin lispro 100 UNIT/ML injection Commonly known as:  HUMALOG Inject 15 Units into the skin 2 (two) times daily.   LANTUS 100 UNIT/ML injection Generic drug:  insulin glargine Inject 80 Units into the skin 2 (two) times daily.   metFORMIN 500 MG 24 hr tablet Commonly known as:  GLUCOPHAGE-XR Take 2,000 mg by mouth daily.   montelukast 10 MG tablet Commonly known as:  SINGULAIR Take 1 tablet (10 mg total) by mouth at bedtime.   omeprazole 40 MG capsule Commonly known as:  PRILOSEC TAKE 1 CAPSULE EVERY MORNING   PROAIR HFA 108 (90 Base) MCG/ACT inhaler Generic drug:  albuterol INHALE 2 PUFFS INTO THE LUNGS EVERY 6 HOURS AS NEEDED FOR WHEEZING OR SHORTNESS OF BREATH   albuterol (2.5 MG/3ML) 0.083% nebulizer solution Commonly known as:  PROVENTIL Take 3 mLs (2.5 mg  total) by nebulization every 6 (six) hours as needed for wheezing or shortness of breath (dx: J43.9).   propylthiouracil 50 MG tablet Commonly known as:  PTU Take 1 tablet by mouth 2 times daily.   REMICADE IV Inject 1 Dose into the vein every 6 (six) weeks.   sertraline 50 MG tablet Commonly known as:  ZOLOFT Take 50 mg by mouth daily. TAKE ONE TABLET BY MOUTH ONCE DAILY.   simvastatin 40 MG tablet Commonly known as:  ZOCOR Take 40 mg by mouth daily with supper.

## 2017-12-04 DIAGNOSIS — E1142 Type 2 diabetes mellitus with diabetic polyneuropathy: Secondary | ICD-10-CM | POA: Diagnosis not present

## 2017-12-04 DIAGNOSIS — L97511 Non-pressure chronic ulcer of other part of right foot limited to breakdown of skin: Secondary | ICD-10-CM | POA: Diagnosis not present

## 2017-12-05 DIAGNOSIS — E1140 Type 2 diabetes mellitus with diabetic neuropathy, unspecified: Secondary | ICD-10-CM | POA: Diagnosis not present

## 2017-12-05 DIAGNOSIS — H8110 Benign paroxysmal vertigo, unspecified ear: Secondary | ICD-10-CM | POA: Diagnosis not present

## 2017-12-18 DIAGNOSIS — L97511 Non-pressure chronic ulcer of other part of right foot limited to breakdown of skin: Secondary | ICD-10-CM | POA: Diagnosis not present

## 2017-12-18 DIAGNOSIS — E1342 Other specified diabetes mellitus with diabetic polyneuropathy: Secondary | ICD-10-CM | POA: Diagnosis not present

## 2017-12-18 DIAGNOSIS — B351 Tinea unguium: Secondary | ICD-10-CM | POA: Diagnosis not present

## 2017-12-18 DIAGNOSIS — L851 Acquired keratosis [keratoderma] palmaris et plantaris: Secondary | ICD-10-CM | POA: Diagnosis not present

## 2017-12-19 ENCOUNTER — Other Ambulatory Visit (HOSPITAL_COMMUNITY)
Admission: RE | Admit: 2017-12-19 | Discharge: 2017-12-19 | Disposition: A | Discharge status: Home / Self Care | Payer: Medicare Other | Source: Ambulatory Visit | Attending: Endocrinology | Admitting: Endocrinology

## 2017-12-19 DIAGNOSIS — E0590 Thyrotoxicosis, unspecified without thyrotoxic crisis or storm: Secondary | ICD-10-CM | POA: Diagnosis not present

## 2017-12-19 LAB — T4, FREE: Free T4: 0.68 ng/dL — ABNORMAL LOW (ref 0.82–1.77)

## 2017-12-19 LAB — TSH: TSH: 0.215 u[IU]/mL — ABNORMAL LOW (ref 0.350–4.500)

## 2017-12-20 LAB — T3, FREE: T3, Free: 3 pg/mL (ref 2.0–4.4)

## 2017-12-22 ENCOUNTER — Encounter: Payer: Self-pay | Admitting: Endocrinology

## 2017-12-22 ENCOUNTER — Ambulatory Visit (INDEPENDENT_AMBULATORY_CARE_PROVIDER_SITE_OTHER): Payer: Medicare Other | Admitting: Endocrinology

## 2017-12-22 VITALS — BP 108/54 | HR 91 | Temp 97.9°F | Ht 61.0 in | Wt 276.4 lb

## 2017-12-22 DIAGNOSIS — Z794 Long term (current) use of insulin: Secondary | ICD-10-CM | POA: Diagnosis not present

## 2017-12-22 DIAGNOSIS — E119 Type 2 diabetes mellitus without complications: Secondary | ICD-10-CM | POA: Diagnosis not present

## 2017-12-22 LAB — POCT GLYCOSYLATED HEMOGLOBIN (HGB A1C): Hemoglobin A1C: 6.6 % — AB (ref 4.0–5.6)

## 2017-12-22 NOTE — Patient Instructions (Signed)
Stop Ptu  1/2 Benicar

## 2017-12-22 NOTE — Progress Notes (Addendum)
Patient ID: [name], female   DOB: 10/26/1950, 67 y.o.   MRN: [id number]                                                                                                              Reason for Appointment: Abnormal thyroid  Referring physician: Eleonore Chiquito   Chief complaint: Abnormal thyroid test   History of Present Illness:   She had a routine screening TSH test during her hospitalization for COPD in April Also at that time she was tachycardic, was apparently fairly ill at that time with her COPD exacerbation She did not have any other symptoms suggestive of hyperthyroidism although had lost weight from trying a better diet She tends to feel tired and has been feeling this way since the beginning of the year  Because of her subclinical hyperthyroidism, history of hypertension and high normal free T3 she has been on a trial of PTU Her thyroid scan did not show any overactive areas and uptake of 19%  Wt Readings from Last 3 Encounters:  12/22/17 276 lb 6.4 oz (125.4 kg)  12/01/17 279 lb 6.4 oz (126.7 kg)  10/17/17 272 lb 6.4 oz (123.6 kg)     Treatments so far: PTU 50 mg twice daily  Thyroid function tests as follows:     Lab Results  Component Value Date   FREET4 0.68 (L) 12/19/2017   FREET4 0.81 10/17/2017   T3FREE 3.0 12/19/2017   T3FREE 3.6 10/17/2017   TSH 0.215 (L) 12/19/2017   TSH 0.02 (L) 10/17/2017   TSH 0.018 (L) 07/29/2017    Lab Results  Component Value Date   THYROTRECAB 0.55 10/18/2017     Allergies as of 12/22/2017      Reactions   Demerol Nausea And Vomiting   Severe   Hydromorphone Hcl Itching   All over the body.   Tetracycline Hives      Medication List        Accurate as of 12/22/17  2:50 PM. Always use your most recent med list.          ALPRAZolam 0.5 MG tablet Commonly known as:  XANAX Take 0.5 mg by mouth 3 (three) times daily as needed for sleep or anxiety.   ARTIFICIAL TEARS OP Place 1 drop into both eyes 4 (four) times  daily.   aspirin 81 MG tablet Take 81 mg by mouth daily.   BENICAR 40 MG tablet Generic drug:  olmesartan Take 40 mg by mouth daily.   BYDUREON 2 MG Pen Generic drug:  Exenatide ER Inject 2 mg into the muscle every Saturday.   calamine lotion Apply 1 application topically as needed (bug bites).   CALCIUM 600 + D PO Take 1 tablet by mouth 2 (two) times daily.   cetirizine 10 MG tablet Commonly known as:  ZYRTEC Take 10 mg by mouth at bedtime.   empagliflozin 25 MG Tabs tablet Commonly known as:  JARDIANCE Take 25 mg by mouth daily.   etodolac 400 MG tablet Commonly known as:  LODINE Take 400 mg by mouth 2 (two) times daily.   fish oil-omega-3 fatty acids 1000 MG capsule Take 2 g by mouth every evening.   Fluticasone-Umeclidin-Vilant 100-62.5-25 MCG/INH Aepb Inhale 1 puff into the lungs daily.   furosemide 40 MG tablet Commonly known as:  LASIX Take 40 mg by mouth daily as needed for fluid. Fluid   ibuprofen 200 MG tablet Commonly known as:  ADVIL,MOTRIN Take 400-600 mg by mouth every 8 (eight) hours as needed for moderate pain. Pain   ICAPS MV PO Take 1 tablet by mouth 2 (two) times daily.   insulin lispro 100 UNIT/ML injection Commonly known as:  HUMALOG Inject 15 Units into the skin 2 (two) times daily.   LANTUS 100 UNIT/ML injection Generic drug:  insulin glargine Inject 80 Units into the skin 2 (two) times daily.   metFORMIN 500 MG 24 hr tablet Commonly known as:  GLUCOPHAGE-XR Take 2,000 mg by mouth daily.   montelukast 10 MG tablet Commonly known as:  SINGULAIR Take 1 tablet (10 mg total) by mouth at bedtime.   omeprazole 40 MG capsule Commonly known as:  PRILOSEC TAKE 1 CAPSULE EVERY MORNING   PROAIR HFA 108 (90 Base) MCG/ACT inhaler Generic drug:  albuterol INHALE 2 PUFFS INTO THE LUNGS EVERY 6 HOURS AS NEEDED FOR WHEEZING OR SHORTNESS OF BREATH   albuterol (2.5 MG/3ML) 0.083% nebulizer solution Commonly known as:  PROVENTIL Take 3 mLs  (2.5 mg total) by nebulization every 6 (six) hours as needed for wheezing or shortness of breath (dx: J43.9).   REMICADE IV Inject 1 Dose into the vein every 6 (six) weeks.   sertraline 50 MG tablet Commonly known as:  ZOLOFT Take 50 mg by mouth daily. TAKE ONE TABLET BY MOUTH ONCE DAILY.   simvastatin 40 MG tablet Commonly known as:  ZOCOR Take 40 mg by mouth daily with supper.           Past Medical History:  Diagnosis Date  . Asthma   . COPD (chronic obstructive pulmonary disease) ([place])   . Depression   . DM type 1 (diabetes mellitus, type 1) ([place])   . GERD (gastroesophageal reflux disease)   . Hyperlipidemia   . Hypertension   . Morbid obesity ([place])   . Obstructive sleep apnea syndrome   . Osteoarthritis of right knee 04/2010   end-stage  . Rheumatoid arthritis(714.0)    on methotrexate therapy  . Septic arthritis of knee, right ([place]) 04/2010    Past Surgical History:  Procedure Laterality Date  . ABDOMINAL HYSTERECTOMY  1992  . ABDOMINAL WALL MESH  REMOVAL  08/13/2009   with removal of retained stitches  . [place]   for cardiac tamponade  . APPENDECTOMY  1967  . BONE BIOPSY Right 04/19/2016   Procedure: BONE BIOPSY RIGHT GREAT TOE;  Surgeon: Caprice Beaver, DPM;  Location: [facility];  Service: Podiatry;  Laterality: Right;  . CHOLECYSTECTOMY  1992   laparoscopic  . COLONOSCOPY  12/08/2011   Procedure: COLONOSCOPY;  Surgeon: Rogene Houston, MD;  Location: [facility];  Service: Endoscopy;  Laterality: N/A;  830  . COLONOSCOPY WITH PROPOFOL N/A 10/16/2017   Procedure: COLONOSCOPY WITH PROPOFOL;  Surgeon: Doran Stabler, MD;  Location: [facility];  Service: Gastroenterology;  Laterality: N/A;  . FOREIGN BODY REMOVAL ABDOMINAL  01/04/2010   stitch abscesses  . HERNIA REPAIR  2001   open LOA / [place] repair w mesh.  Dr. Arnoldo Morale  .  HERNIA REPAIR  2007   open redo LOA / [place] repair w mesh.  Dr. Arnoldo Morale  . POLYPECTOMY  10/16/2017    Procedure: POLYPECTOMY;  Surgeon: Doran Stabler, MD;  Location: [facility];  Service: Gastroenterology;;  . REVISION TOTAL KNEE ARTHROPLASTY  06/09/2010   I and D   . TOTAL KNEE ARTHROPLASTY  05/07/2010   right knee    Family History  Problem Relation Age of Onset  . Cancer Mother   . Hyperlipidemia Mother   . Diabetes Mother   . Thyroid disease Mother   . Diabetes Father   . COPD Father   . Arthritis Father        RA  . Hyperlipidemia Brother   . Hypertension Brother     Social History:  reports that she quit smoking about 25 years ago. Her smoking use included cigarettes. She has a 50.00 pack-year smoking history. She has never used smokeless tobacco. She reports that she does not drink alcohol or use drugs.  Allergies:  Allergies  Allergen Reactions  . Demerol Nausea And Vomiting    Severe  . Hydromorphone Hcl Itching    All over the body.  . Tetracycline Hives     Review of Systems She has been on insulin for her diabetes since 1992 Also on Jardiance, Bydureon and metformin  Recent A1c by PCP was 6.8 and today is 6.6  She is followed long-term by her PCP.  Lab Results  Component Value Date   HGBA1C 6.6 (A) 12/22/2017   HGBA1C 7.8 (H) 09/16/2016   HGBA1C (H) 06/10/2010    7.0 (NOTE)                                                                       According to the ADA Clinical Practice Recommendations for 2011, when HbA1c is used as a screening test:   >=6.5%   Diagnostic of Diabetes Mellitus           (if abnormal result  is confirmed)  5.7-6.4%   Increased risk of developing Diabetes Mellitus  References:Diagnosis and Classification of Diabetes Mellitus,Diabetes FXTK,2409,73(ZHGDJ 1):S62-S69 and Standards of Medical Care in         Diabetes - 2011,Diabetes Care,2011,34  (Suppl 1):S11-S61.   Lab Results  Component Value Date   CREATININE 0.92 07/30/2017     HYPERTENSION: She is taking Benicar 40 mg.  She says her blood pressure has been about  242 systolic She is also on Jardiance    Examination:   BP (!) 108/54 (BP Location: Right Arm, Cuff Size: Large)   Pulse 91   Temp 97.9 F (36.6 C) (Oral)   Ht 5\' 1"  (1.549 m)   Wt 276 lb 6.4 oz (125.4 kg)   SpO2 94%   BMI 52.23 kg/m   Thyroid not palpable No tremor Biceps reflexes appear normal    Assessment/Plan:  Subclinical Hyperthyroidism  She had a significantly suppressed TSH and this had persisted in July No evidence of toxic nodular goiter, heart nodules or Graves' disease on evaluation  However with a trial of PTU 50 mg twice daily her free T4 is now mildly decreased although TSH is still relatively low  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 and appears to be improved with significant decrease in free T4 with very low-dose PTU  For this reason we will have her stop her PTU, unlikely she needs any treatment and she will follow-up with her PCP in November for repeat labs on thyroid including T3 levels  HYPERTENSION: Her blood pressure is low normal Considering that she is on maximum dose Benicar along with Jardiance she is at risk for renal insufficiency and she will cut down her Benicar to half a tablet To follow-up with PCP  Elayne Snare 12/22/2017, 2:50 PM       Note: This office note was prepared with Dragon voice recognition system technology. Any transcriptional errors that result from this process are unintentional.

## 2017-12-24 ENCOUNTER — Other Ambulatory Visit: Payer: Self-pay | Admitting: Adult Health

## 2017-12-26 ENCOUNTER — Other Ambulatory Visit (HOSPITAL_COMMUNITY): Payer: Self-pay | Admitting: Internal Medicine

## 2017-12-26 DIAGNOSIS — Z1231 Encounter for screening mammogram for malignant neoplasm of breast: Secondary | ICD-10-CM

## 2017-12-28 DIAGNOSIS — Z23 Encounter for immunization: Secondary | ICD-10-CM | POA: Diagnosis not present

## 2018-01-01 DIAGNOSIS — M0589 Other rheumatoid arthritis with rheumatoid factor of multiple sites: Secondary | ICD-10-CM | POA: Diagnosis not present

## 2018-01-08 DIAGNOSIS — L97511 Non-pressure chronic ulcer of other part of right foot limited to breakdown of skin: Secondary | ICD-10-CM | POA: Diagnosis not present

## 2018-01-08 DIAGNOSIS — E1142 Type 2 diabetes mellitus with diabetic polyneuropathy: Secondary | ICD-10-CM | POA: Diagnosis not present

## 2018-01-17 ENCOUNTER — Ambulatory Visit (HOSPITAL_COMMUNITY)
Admission: RE | Admit: 2018-01-17 | Discharge: 2018-01-17 | Disposition: A | Discharge status: Home / Self Care | Payer: Medicare Other | Source: Ambulatory Visit | Attending: Internal Medicine | Admitting: Internal Medicine

## 2018-01-17 DIAGNOSIS — Z1231 Encounter for screening mammogram for malignant neoplasm of breast: Secondary | ICD-10-CM | POA: Insufficient documentation

## 2018-01-29 DIAGNOSIS — L97511 Non-pressure chronic ulcer of other part of right foot limited to breakdown of skin: Secondary | ICD-10-CM | POA: Diagnosis not present

## 2018-01-29 DIAGNOSIS — E1142 Type 2 diabetes mellitus with diabetic polyneuropathy: Secondary | ICD-10-CM | POA: Diagnosis not present

## 2018-01-30 DIAGNOSIS — L299 Pruritus, unspecified: Secondary | ICD-10-CM | POA: Diagnosis not present

## 2018-01-30 DIAGNOSIS — M0589 Other rheumatoid arthritis with rheumatoid factor of multiple sites: Secondary | ICD-10-CM | POA: Diagnosis not present

## 2018-01-30 DIAGNOSIS — M150 Primary generalized (osteo)arthritis: Secondary | ICD-10-CM | POA: Diagnosis not present

## 2018-01-30 DIAGNOSIS — M545 Low back pain: Secondary | ICD-10-CM | POA: Diagnosis not present

## 2018-01-30 DIAGNOSIS — Z6841 Body Mass Index (BMI) 40.0 and over, adult: Secondary | ICD-10-CM | POA: Diagnosis not present

## 2018-01-30 DIAGNOSIS — Z79899 Other long term (current) drug therapy: Secondary | ICD-10-CM | POA: Diagnosis not present

## 2018-02-12 DIAGNOSIS — M0589 Other rheumatoid arthritis with rheumatoid factor of multiple sites: Secondary | ICD-10-CM | POA: Diagnosis not present

## 2018-02-26 DIAGNOSIS — L851 Acquired keratosis [keratoderma] palmaris et plantaris: Secondary | ICD-10-CM | POA: Diagnosis not present

## 2018-02-26 DIAGNOSIS — B351 Tinea unguium: Secondary | ICD-10-CM | POA: Diagnosis not present

## 2018-02-26 DIAGNOSIS — E1342 Other specified diabetes mellitus with diabetic polyneuropathy: Secondary | ICD-10-CM | POA: Diagnosis not present

## 2018-03-26 DIAGNOSIS — M0589 Other rheumatoid arthritis with rheumatoid factor of multiple sites: Secondary | ICD-10-CM | POA: Diagnosis not present

## 2018-03-30 DIAGNOSIS — E1140 Type 2 diabetes mellitus with diabetic neuropathy, unspecified: Secondary | ICD-10-CM | POA: Diagnosis not present

## 2018-04-09 DIAGNOSIS — E1142 Type 2 diabetes mellitus with diabetic polyneuropathy: Secondary | ICD-10-CM | POA: Diagnosis not present

## 2018-04-09 DIAGNOSIS — I10 Essential (primary) hypertension: Secondary | ICD-10-CM | POA: Diagnosis not present

## 2018-04-09 DIAGNOSIS — L97511 Non-pressure chronic ulcer of other part of right foot limited to breakdown of skin: Secondary | ICD-10-CM | POA: Diagnosis not present

## 2018-04-09 DIAGNOSIS — E1140 Type 2 diabetes mellitus with diabetic neuropathy, unspecified: Secondary | ICD-10-CM | POA: Diagnosis not present

## 2018-05-07 DIAGNOSIS — E1342 Other specified diabetes mellitus with diabetic polyneuropathy: Secondary | ICD-10-CM | POA: Diagnosis not present

## 2018-05-07 DIAGNOSIS — B351 Tinea unguium: Secondary | ICD-10-CM | POA: Diagnosis not present

## 2018-05-07 DIAGNOSIS — L851 Acquired keratosis [keratoderma] palmaris et plantaris: Secondary | ICD-10-CM | POA: Diagnosis not present

## 2018-05-09 DIAGNOSIS — M0589 Other rheumatoid arthritis with rheumatoid factor of multiple sites: Secondary | ICD-10-CM | POA: Diagnosis not present

## 2018-06-04 DIAGNOSIS — E1142 Type 2 diabetes mellitus with diabetic polyneuropathy: Secondary | ICD-10-CM | POA: Diagnosis not present

## 2018-06-04 DIAGNOSIS — L97511 Non-pressure chronic ulcer of other part of right foot limited to breakdown of skin: Secondary | ICD-10-CM | POA: Diagnosis not present

## 2018-06-05 ENCOUNTER — Other Ambulatory Visit: Payer: Self-pay | Admitting: Pulmonary Disease

## 2018-06-11 DIAGNOSIS — Z79899 Other long term (current) drug therapy: Secondary | ICD-10-CM | POA: Diagnosis not present

## 2018-06-11 DIAGNOSIS — L299 Pruritus, unspecified: Secondary | ICD-10-CM | POA: Diagnosis not present

## 2018-06-11 DIAGNOSIS — M150 Primary generalized (osteo)arthritis: Secondary | ICD-10-CM | POA: Diagnosis not present

## 2018-06-11 DIAGNOSIS — M545 Low back pain: Secondary | ICD-10-CM | POA: Diagnosis not present

## 2018-06-11 DIAGNOSIS — M0589 Other rheumatoid arthritis with rheumatoid factor of multiple sites: Secondary | ICD-10-CM | POA: Diagnosis not present

## 2018-06-11 DIAGNOSIS — Z6841 Body Mass Index (BMI) 40.0 and over, adult: Secondary | ICD-10-CM | POA: Diagnosis not present

## 2018-06-20 DIAGNOSIS — M0589 Other rheumatoid arthritis with rheumatoid factor of multiple sites: Secondary | ICD-10-CM | POA: Diagnosis not present

## 2018-06-20 DIAGNOSIS — Z79899 Other long term (current) drug therapy: Secondary | ICD-10-CM | POA: Diagnosis not present

## 2018-06-25 DIAGNOSIS — S0121XA Laceration without foreign body of nose, initial encounter: Secondary | ICD-10-CM | POA: Diagnosis not present

## 2018-06-25 DIAGNOSIS — S12040A Displaced lateral mass fracture of first cervical vertebra, initial encounter for closed fracture: Secondary | ICD-10-CM | POA: Diagnosis not present

## 2018-06-25 DIAGNOSIS — Z87891 Personal history of nicotine dependence: Secondary | ICD-10-CM | POA: Diagnosis not present

## 2018-06-25 DIAGNOSIS — Z888 Allergy status to other drugs, medicaments and biological substances status: Secondary | ICD-10-CM | POA: Diagnosis not present

## 2018-06-25 DIAGNOSIS — Z043 Encounter for examination and observation following other accident: Secondary | ICD-10-CM | POA: Diagnosis not present

## 2018-06-25 DIAGNOSIS — D6489 Other specified anemias: Secondary | ICD-10-CM | POA: Diagnosis not present

## 2018-06-25 DIAGNOSIS — S22029A Unspecified fracture of second thoracic vertebra, initial encounter for closed fracture: Secondary | ICD-10-CM | POA: Diagnosis not present

## 2018-06-25 DIAGNOSIS — S066X9A Traumatic subarachnoid hemorrhage with loss of consciousness of unspecified duration, initial encounter: Secondary | ICD-10-CM | POA: Diagnosis not present

## 2018-06-25 DIAGNOSIS — S066X0A Traumatic subarachnoid hemorrhage without loss of consciousness, initial encounter: Secondary | ICD-10-CM | POA: Diagnosis not present

## 2018-06-25 DIAGNOSIS — S0101XA Laceration without foreign body of scalp, initial encounter: Secondary | ICD-10-CM | POA: Diagnosis not present

## 2018-06-25 DIAGNOSIS — S12040K Displaced lateral mass fracture of first cervical vertebra, subsequent encounter for fracture with nonunion: Secondary | ICD-10-CM | POA: Diagnosis not present

## 2018-06-25 DIAGNOSIS — S0181XA Laceration without foreign body of other part of head, initial encounter: Secondary | ICD-10-CM | POA: Diagnosis not present

## 2018-06-25 DIAGNOSIS — T1490XA Injury, unspecified, initial encounter: Secondary | ICD-10-CM | POA: Diagnosis not present

## 2018-06-25 DIAGNOSIS — S12091A Other nondisplaced fracture of first cervical vertebra, initial encounter for closed fracture: Secondary | ICD-10-CM | POA: Diagnosis not present

## 2018-06-25 DIAGNOSIS — Z881 Allergy status to other antibiotic agents status: Secondary | ICD-10-CM | POA: Diagnosis not present

## 2018-06-25 DIAGNOSIS — S22019A Unspecified fracture of first thoracic vertebra, initial encounter for closed fracture: Secondary | ICD-10-CM | POA: Diagnosis not present

## 2018-06-25 DIAGNOSIS — W109XXA Fall (on) (from) unspecified stairs and steps, initial encounter: Secondary | ICD-10-CM | POA: Diagnosis not present

## 2018-06-26 DIAGNOSIS — R162 Hepatomegaly with splenomegaly, not elsewhere classified: Secondary | ICD-10-CM | POA: Diagnosis not present

## 2018-06-26 DIAGNOSIS — R402144 Coma scale, eyes open, spontaneous, 24 hours or more after hospital admission: Secondary | ICD-10-CM | POA: Diagnosis not present

## 2018-06-26 DIAGNOSIS — Z043 Encounter for examination and observation following other accident: Secondary | ICD-10-CM | POA: Diagnosis not present

## 2018-06-26 DIAGNOSIS — R402142 Coma scale, eyes open, spontaneous, at arrival to emergency department: Secondary | ICD-10-CM | POA: Diagnosis present

## 2018-06-26 DIAGNOSIS — Z87891 Personal history of nicotine dependence: Secondary | ICD-10-CM | POA: Diagnosis not present

## 2018-06-26 DIAGNOSIS — R402362 Coma scale, best motor response, obeys commands, at arrival to emergency department: Secondary | ICD-10-CM | POA: Diagnosis present

## 2018-06-26 DIAGNOSIS — E119 Type 2 diabetes mellitus without complications: Secondary | ICD-10-CM | POA: Diagnosis not present

## 2018-06-26 DIAGNOSIS — D279 Benign neoplasm of unspecified ovary: Secondary | ICD-10-CM | POA: Diagnosis not present

## 2018-06-26 DIAGNOSIS — S0181XA Laceration without foreign body of other part of head, initial encounter: Secondary | ICD-10-CM | POA: Diagnosis not present

## 2018-06-26 DIAGNOSIS — I609 Nontraumatic subarachnoid hemorrhage, unspecified: Secondary | ICD-10-CM | POA: Insufficient documentation

## 2018-06-26 DIAGNOSIS — R402252 Coma scale, best verbal response, oriented, at arrival to emergency department: Secondary | ICD-10-CM | POA: Diagnosis present

## 2018-06-26 DIAGNOSIS — E1142 Type 2 diabetes mellitus with diabetic polyneuropathy: Secondary | ICD-10-CM | POA: Diagnosis not present

## 2018-06-26 DIAGNOSIS — G4733 Obstructive sleep apnea (adult) (pediatric): Secondary | ICD-10-CM | POA: Diagnosis not present

## 2018-06-26 DIAGNOSIS — E7800 Pure hypercholesterolemia, unspecified: Secondary | ICD-10-CM | POA: Diagnosis present

## 2018-06-26 DIAGNOSIS — F419 Anxiety disorder, unspecified: Secondary | ICD-10-CM | POA: Diagnosis present

## 2018-06-26 DIAGNOSIS — Z888 Allergy status to other drugs, medicaments and biological substances status: Secondary | ICD-10-CM | POA: Diagnosis not present

## 2018-06-26 DIAGNOSIS — S22029A Unspecified fracture of second thoracic vertebra, initial encounter for closed fracture: Secondary | ICD-10-CM | POA: Diagnosis not present

## 2018-06-26 DIAGNOSIS — I10 Essential (primary) hypertension: Secondary | ICD-10-CM | POA: Diagnosis not present

## 2018-06-26 DIAGNOSIS — R402254 Coma scale, best verbal response, oriented, 24 hours or more after hospital admission: Secondary | ICD-10-CM | POA: Diagnosis not present

## 2018-06-26 DIAGNOSIS — Z881 Allergy status to other antibiotic agents status: Secondary | ICD-10-CM | POA: Diagnosis not present

## 2018-06-26 DIAGNOSIS — W19XXXA Unspecified fall, initial encounter: Secondary | ICD-10-CM | POA: Diagnosis not present

## 2018-06-26 DIAGNOSIS — D649 Anemia, unspecified: Secondary | ICD-10-CM | POA: Diagnosis not present

## 2018-06-26 DIAGNOSIS — E669 Obesity, unspecified: Secondary | ICD-10-CM | POA: Diagnosis present

## 2018-06-26 DIAGNOSIS — S12000A Unspecified displaced fracture of first cervical vertebra, initial encounter for closed fracture: Secondary | ICD-10-CM | POA: Diagnosis not present

## 2018-06-26 DIAGNOSIS — S066X9A Traumatic subarachnoid hemorrhage with loss of consciousness of unspecified duration, initial encounter: Secondary | ICD-10-CM | POA: Diagnosis not present

## 2018-06-26 DIAGNOSIS — M25531 Pain in right wrist: Secondary | ICD-10-CM | POA: Diagnosis not present

## 2018-06-26 DIAGNOSIS — R402364 Coma scale, best motor response, obeys commands, 24 hours or more after hospital admission: Secondary | ICD-10-CM | POA: Diagnosis not present

## 2018-06-26 DIAGNOSIS — E049 Nontoxic goiter, unspecified: Secondary | ICD-10-CM | POA: Diagnosis not present

## 2018-06-26 DIAGNOSIS — E785 Hyperlipidemia, unspecified: Secondary | ICD-10-CM | POA: Diagnosis not present

## 2018-06-26 DIAGNOSIS — S22019A Unspecified fracture of first thoracic vertebra, initial encounter for closed fracture: Secondary | ICD-10-CM | POA: Diagnosis not present

## 2018-06-26 DIAGNOSIS — D509 Iron deficiency anemia, unspecified: Secondary | ICD-10-CM | POA: Diagnosis present

## 2018-06-26 DIAGNOSIS — S12091A Other nondisplaced fracture of first cervical vertebra, initial encounter for closed fracture: Secondary | ICD-10-CM | POA: Diagnosis not present

## 2018-06-26 DIAGNOSIS — Z794 Long term (current) use of insulin: Secondary | ICD-10-CM | POA: Diagnosis not present

## 2018-06-26 DIAGNOSIS — X58XXXA Exposure to other specified factors, initial encounter: Secondary | ICD-10-CM | POA: Diagnosis not present

## 2018-06-26 DIAGNOSIS — Z6841 Body Mass Index (BMI) 40.0 and over, adult: Secondary | ICD-10-CM | POA: Diagnosis not present

## 2018-06-26 DIAGNOSIS — J449 Chronic obstructive pulmonary disease, unspecified: Secondary | ICD-10-CM | POA: Diagnosis not present

## 2018-06-26 DIAGNOSIS — S0121XA Laceration without foreign body of nose, initial encounter: Secondary | ICD-10-CM | POA: Diagnosis present

## 2018-06-26 DIAGNOSIS — K439 Ventral hernia without obstruction or gangrene: Secondary | ICD-10-CM | POA: Diagnosis present

## 2018-06-26 DIAGNOSIS — S12041A Nondisplaced lateral mass fracture of first cervical vertebra, initial encounter for closed fracture: Secondary | ICD-10-CM | POA: Diagnosis not present

## 2018-06-26 DIAGNOSIS — S0101XA Laceration without foreign body of scalp, initial encounter: Secondary | ICD-10-CM | POA: Diagnosis not present

## 2018-06-26 DIAGNOSIS — T1490XA Injury, unspecified, initial encounter: Secondary | ICD-10-CM | POA: Diagnosis not present

## 2018-06-26 DIAGNOSIS — D6489 Other specified anemias: Secondary | ICD-10-CM | POA: Diagnosis not present

## 2018-06-27 DIAGNOSIS — S129XXA Fracture of neck, unspecified, initial encounter: Secondary | ICD-10-CM | POA: Insufficient documentation

## 2018-07-02 ENCOUNTER — Telehealth: Payer: Self-pay | Admitting: Pulmonary Disease

## 2018-07-02 DIAGNOSIS — G4733 Obstructive sleep apnea (adult) (pediatric): Secondary | ICD-10-CM

## 2018-07-02 NOTE — Telephone Encounter (Signed)
Dr. Halford Chessman, please see pt's mychart message and please advise on it for pt. Thanks!

## 2018-07-02 NOTE — Telephone Encounter (Signed)
Patient had recent neck fracture and has to wear neck brace.  Can't wear current CPAP mask with neck brace.    Please send order to [facility] to have them refit her CPAP mask.

## 2018-07-04 ENCOUNTER — Ambulatory Visit: Payer: Medicare Other | Admitting: Pulmonary Disease

## 2018-07-04 DIAGNOSIS — S12000A Unspecified displaced fracture of first cervical vertebra, initial encounter for closed fracture: Secondary | ICD-10-CM | POA: Diagnosis not present

## 2018-07-04 DIAGNOSIS — I609 Nontraumatic subarachnoid hemorrhage, unspecified: Secondary | ICD-10-CM | POA: Diagnosis not present

## 2018-07-04 NOTE — Telephone Encounter (Signed)
Order has been placed for mask refit for patient to [facility] LVM for patient with this info.X1

## 2018-07-05 NOTE — Telephone Encounter (Signed)
Called and spoke with patient regarding her cpap mask Advised her order has been placed with [facility] Pt expressed and verbalized understanding Nothing further needed.

## 2018-07-13 DIAGNOSIS — G4733 Obstructive sleep apnea (adult) (pediatric): Secondary | ICD-10-CM

## 2018-07-13 NOTE — Telephone Encounter (Signed)
[name], this message was sent for Dr Halford Chessman this morning.   had to change masks and head gear due to having to wear a cervical collar 24/7. I am using the nasal pillows. My CPAP pressure is set at 10. Would it be possible to send an order to go back down to 8. The pillows blast more than my mask did. Thank you for all you help.  Message routed to [place], NP

## 2018-07-13 NOTE — Telephone Encounter (Signed)
Yes. Please decrease pressure to 8cmH20.

## 2018-07-23 DIAGNOSIS — M5412 Radiculopathy, cervical region: Secondary | ICD-10-CM | POA: Diagnosis not present

## 2018-07-25 ENCOUNTER — Other Ambulatory Visit: Payer: Self-pay | Admitting: Orthopedic Surgery

## 2018-07-25 DIAGNOSIS — M5412 Radiculopathy, cervical region: Secondary | ICD-10-CM

## 2018-07-30 DIAGNOSIS — I10 Essential (primary) hypertension: Secondary | ICD-10-CM | POA: Diagnosis not present

## 2018-07-30 DIAGNOSIS — Z79899 Other long term (current) drug therapy: Secondary | ICD-10-CM | POA: Diagnosis not present

## 2018-07-30 DIAGNOSIS — D509 Iron deficiency anemia, unspecified: Secondary | ICD-10-CM | POA: Diagnosis not present

## 2018-07-30 DIAGNOSIS — J45902 Unspecified asthma with status asthmaticus: Secondary | ICD-10-CM | POA: Diagnosis not present

## 2018-07-30 DIAGNOSIS — E1140 Type 2 diabetes mellitus with diabetic neuropathy, unspecified: Secondary | ICD-10-CM | POA: Diagnosis not present

## 2018-08-01 DIAGNOSIS — M0589 Other rheumatoid arthritis with rheumatoid factor of multiple sites: Secondary | ICD-10-CM | POA: Diagnosis not present

## 2018-08-02 ENCOUNTER — Ambulatory Visit
Admission: RE | Admit: 2018-08-02 | Discharge: 2018-08-02 | Disposition: A | Discharge status: Home / Self Care | Payer: Medicare Other | Source: Ambulatory Visit | Attending: Orthopedic Surgery | Admitting: Orthopedic Surgery

## 2018-08-02 ENCOUNTER — Other Ambulatory Visit: Payer: Self-pay

## 2018-08-02 DIAGNOSIS — M4802 Spinal stenosis, cervical region: Secondary | ICD-10-CM | POA: Diagnosis not present

## 2018-08-02 DIAGNOSIS — M5412 Radiculopathy, cervical region: Secondary | ICD-10-CM

## 2018-08-06 DIAGNOSIS — M542 Cervicalgia: Secondary | ICD-10-CM | POA: Diagnosis not present

## 2018-08-06 DIAGNOSIS — E0590 Thyrotoxicosis, unspecified without thyrotoxic crisis or storm: Secondary | ICD-10-CM | POA: Diagnosis not present

## 2018-08-06 DIAGNOSIS — D509 Iron deficiency anemia, unspecified: Secondary | ICD-10-CM | POA: Diagnosis not present

## 2018-08-06 DIAGNOSIS — E785 Hyperlipidemia, unspecified: Secondary | ICD-10-CM | POA: Diagnosis not present

## 2018-08-06 DIAGNOSIS — E1140 Type 2 diabetes mellitus with diabetic neuropathy, unspecified: Secondary | ICD-10-CM | POA: Diagnosis not present

## 2018-08-07 ENCOUNTER — Telehealth: Payer: Self-pay | Admitting: Gastroenterology

## 2018-08-07 NOTE — Telephone Encounter (Signed)
I received a call from Dr. Asencion Noble, this patient's primary care provider.  Routine labs have shown a microcytic anemia with a hemoglobin between 10 and 11, which is new for this patient.  Recent office note by Dr. Willey Blade was faxed to [place] a request was made for labs as well.  No hemoglobin or CBC accompanies these records, but it does show a ferritin of 8.  The patient has apparently not been having any new digestive symptoms no any overt GI bleeding.  Current records do show her to be on etodolac for arthritis and aspirin.  I recommended that Dr. Willey Blade arrange stool studies to check for occult blood and also start this patient on iron tablets.  Please contact her because Dr. Willey Blade would like her to see [place] regarding this anemia.  As she is immune compromised on infliximab, I would like it to be a telemedicine visit.

## 2018-08-07 NOTE — Telephone Encounter (Signed)
Called and spoke to pt. Scheduled her for a telemedicine visit with Dr. Loletha Carrow on Thursday morning, 4-30 at 8:30am.  Zoom visit confirmed with pt. Verified e-mail address and cell phone number in chart are correct.

## 2018-08-08 ENCOUNTER — Encounter: Payer: Self-pay | Admitting: General Surgery

## 2018-08-09 ENCOUNTER — Other Ambulatory Visit: Payer: Self-pay

## 2018-08-09 ENCOUNTER — Encounter: Payer: Self-pay | Admitting: Gastroenterology

## 2018-08-09 ENCOUNTER — Ambulatory Visit (INDEPENDENT_AMBULATORY_CARE_PROVIDER_SITE_OTHER): Payer: Medicare Other | Admitting: Gastroenterology

## 2018-08-09 VITALS — BP 116/62 | Ht 61.0 in | Wt 253.0 lb

## 2018-08-09 DIAGNOSIS — K5909 Other constipation: Secondary | ICD-10-CM | POA: Diagnosis not present

## 2018-08-09 DIAGNOSIS — D500 Iron deficiency anemia secondary to blood loss (chronic): Secondary | ICD-10-CM

## 2018-08-09 NOTE — Progress Notes (Addendum)
This patient contacted our office requesting a physician telemedicine video consultation regarding clinical questions and/or test results.   Participants on the conference : myself and patient   The patient consented to phone consultation and was aware that a charge will be placed through their insurance.  I was in my office and the patient was at home   Encounter time:  Total time 40 minutes, with 25 minutes spent with patient on phone/webex  (Extensive chart review required)  Wilfrid Lund, MD   _____________________________________________________________________________________________              [facility] Progress Note  Chief Complaint: Iron deficiency anemia  Subjective  History:  I last saw Syra for a surveillance colonoscopy in July 2019.  At that time, multiple adenomatous colon polyps were removed, and the preparation was poor due to nonadherence with preprocedure dietary advice.  The recommendation at that time was for repeat colonoscopy in 1 year. I received a call earlier this week from Dr. Asencion Noble, [facility] his PCP.  He noted on routine labs that she had iron deficiency anemia with a hemoglobin between 10 and 11.  The patient reportedly had no overt GI bleeding.  I recommended that he start her on oral iron, arrange for stool cards for occult blood and that this telemedicine visit be set up with [place]. A recent office note from Dr. Willey Blade was received via fax.  There was no CBC or hemoglobin result, ferritin was 8.  Debborah tells me she is still recovering from a severe fall about 6 weeks ago.  She does not even recall the event, which occurred at home.  She was taken to the emergency department in [place], then immediately sent to [facility] in [facility] when it was determined that she had a "C1 lateral mass fracture", and traumatic subarachnoid hemorrhage.  CT angiogram reportedly negative for vascular involvement.  Both conditions were treated  nonoperatively.  (I reviewed the discharge summary related to this hospitalization, available through epic/care everywhere )  Avonne was in a hard neck collar for 5 weeks, now in a soft collar.  She was on opioids for about the first 2 weeks for pain control.  She has struggled with constipation for years, typically with a BM every 1 to 2 days and the need to strain.  Sometimes she will have "a pink smear" on the stool during straining.  She has not had any frank rectal bleeding, abdominal pain, nausea, vomiting, dysphagia, unexpected weight loss or melena.  She remains on a baby aspirin, and just 2 days ago stopped etodolac, which she had been taking daily for her arthritic pain. She was just prescribed the iron, which Dr. Willey Blade recommended twice a day.  Lavonne has been taking MiraLAX a capful 1-2 times daily on the recommendation of Dr. Willey Blade, but has not found it sufficiently helpful to relieve chronic constipation.  Will also occasionally take Senokot.  ROS: Cardiovascular:  no chest pain Respiratory: no dyspnea  The patient's Past Medical, Family and Social History were reviewed and are on file in the EMR. Past Medical History:  Diagnosis Date   Asthma    COPD (chronic obstructive pulmonary disease) ([place])    Depression    DM type 1 (diabetes mellitus, type 1) ([facility])    GERD (gastroesophageal reflux disease)    Hyperlipidemia    Hypertension    Morbid obesity ([place])    Obstructive sleep apnea syndrome    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   Rheumatoid arthritis(714.0)    on methotrexate therapy   Septic arthritis of knee, right ([place]) 04/2010    Objective:  Med list reviewed  Current Outpatient Medications:    albuterol (PROVENTIL) (2.5 MG/3ML) 0.083% nebulizer solution, Take 3 mLs (2.5 mg total) by nebulization every 6 (six) hours as needed for wheezing or shortness of breath (dx: J43.9)., Disp: 360 mL, Rfl: 5   ALPRAZolam (XANAX) 0.5 MG tablet, Take  0.5 mg by mouth 3 (three) times daily as needed for sleep or anxiety. , Disp: , Rfl:    aspirin 81 MG tablet, Take 81 mg by mouth daily., Disp: , Rfl:    BENICAR 20 MG tablet, Take 20 mg by mouth daily. , Disp: , Rfl:    BYDUREON 2 MG PEN, Inject 2 mg into the muscle every Saturday. , Disp: , Rfl:    calamine lotion, Apply 1 application topically as needed (bug bites)., Disp: , Rfl:    Calcium Carb-Cholecalciferol (CALCIUM 600 + D PO), Take 1 tablet by mouth 2 (two) times daily., Disp: , Rfl:    cetirizine (ZYRTEC) 10 MG tablet, Take 10 mg by mouth at bedtime. , Disp: , Rfl:    empagliflozin (JARDIANCE) 25 MG TABS tablet, Take 25 mg by mouth daily., Disp: , Rfl:    etodolac (LODINE) 400 MG tablet, Take 400 mg by mouth 2 (two) times daily., Disp: , Rfl:    ferrous sulfate 325 (65 FE) MG tablet, Take 325 mg by mouth 2 (two) times a day., Disp: , Rfl:    fish oil-omega-3 fatty acids 1000 MG capsule, Take 2 g by mouth every evening. , Disp: , Rfl:    furosemide (LASIX) 40 MG tablet, Take 40 mg by mouth daily as needed for fluid. Fluid, Disp: , Rfl:    Hypromellose (ARTIFICIAL TEARS OP), Place 1 drop into both eyes 4 (four) times daily., Disp: , Rfl:    ibuprofen (ADVIL,MOTRIN) 200 MG tablet, Take 400-600 mg by mouth every 8 (eight) hours as needed for moderate pain. Pain, Disp: , Rfl:    InFLIXimab (REMICADE IV), Inject 1 Dose into the vein every 6 (six) weeks. , Disp: , Rfl:    insulin glargine (LANTUS) 100 UNIT/ML injection, Inject 70 Units into the skin 2 (two) times daily. 70 in the am 70 in the pm, Disp: , Rfl:    insulin lispro (HUMALOG) 100 UNIT/ML injection, Inject 24 Units into the skin 2 (two) times daily. 10 in am. 14units a dinner, Disp: , Rfl:    metFORMIN (GLUCOPHAGE-XR) 500 MG 24 hr tablet, Take 2,000 mg by mouth daily.  , Disp: , Rfl:    montelukast (SINGULAIR) 10 MG tablet, Take 1 tablet (10 mg total) by mouth at bedtime., Disp: 90 tablet, Rfl: 3   Multiple  Vitamins-Minerals (ICAPS MV PO), Take 1 tablet by mouth 2 (two) times daily., Disp: , Rfl:    omeprazole (PRILOSEC) 40 MG capsule, TAKE 1 CAPSULE EVERY MORNING, Disp: 90 capsule, Rfl: 2   PROAIR HFA 108 (90 BASE) MCG/ACT inhaler, INHALE 2 PUFFS INTO THE LUNGS EVERY 6 HOURS AS NEEDED FOR WHEEZING OR SHORTNESS OF BREATH, Disp: 3 each, Rfl: 3   sertraline (ZOLOFT) 50 MG tablet, Take 50 mg by mouth daily. TAKE ONE TABLET BY MOUTH ONCE DAILY., Disp: , Rfl:    simvastatin (ZOCOR) 40 MG tablet, Take 40 mg by mouth daily with supper. , Disp: , Rfl:    TRELEGY ELLIPTA 100-62.5-25 MCG/INH AEPB, USE 1 INHALATION  DAILY, Disp: 180 each, Rfl: 0    No exam-virtual visit She is alert and conversational in no acute distress during the video conference.  Recent Labs:  As above  From 06/27/18 d/c summary:  WBC 8.3   Hgb 10.1   Plt 230 Normal creatinine  @ASSESSMENTPLANBEGIN @ Assessment: Encounter Diagnoses  Name Primary?   Iron deficiency anemia due to chronic blood loss Yes   Chronic constipation    Iron deficiency anemia of unknown cause, though suspected to be most likely from chronic GI blood loss.  She has not yet received the stool cards in the mail to be checked for occult blood with Dr. Ria Comment office.  It would not be surprising if these are positive.  If they are negative, she still needs upper endoscopy and colonoscopy, but perhaps the urgency would be somewhat decreased.  I am most suspicious of peptic ulcer given chronic NSAID and aspirin use.  Etodolac was just stopped earlier this week, but she remains on low-dose aspirin.  She has no history of angina, MI, cardiac stent, TIA or CVA.  Although the bowel preparation was poor on last year's colonoscopy, I think it is unlikely that a malignancy was not seen.  Estalee needs an upper endoscopy and colonoscopy for this iron deficiency anemia.  I am concerned about proceeding with that in the next several weeks because she is immune  compromised on Remicade, and her procedure needs to be done in the hospital endoscopy lab due to her BMI and chronic lung disease as well as new concerns about airway management in the setting of recent cervical spine fracture.  I am concerned she is at increased risk of contracting COVID-19 in that setting.  I would also need to communicate with Dr.Dumonsky, who she reports is a local orthopedic surgeon following and managing her cervical fracture.  We would need to find out when they feel this fracture is sufficiently healed to allow for safe manipulation of the head and neck for airway management during endoscopic procedure, especially if intubation is required.  Plan: I recommended she discontinue aspirin She will stay off etodolac and stay on once daily omeprazole.  I recommended only taking the iron sulfate 325 mg once daily because I am concerned it will worsen her constipation and cause digestive upset.  If she is able to tolerate it at that dose, then it can be increased to twice daily, which would help improve her anemia faster.  OTC ducosate 200 mg once daily.  As needed- glycerine suppository.  Stool cards for occult blood will be done as recommended through Dr. Ria Comment office.  After I can communicate with her orthopedic surgeon and review available hospital-based endoscopy schedule in light of current COVID related restrictions, we will contact her with plans for upper endoscopy and colonoscopy.    Nelida Meuse III

## 2018-08-13 ENCOUNTER — Telehealth: Payer: Self-pay | Admitting: Gastroenterology

## 2018-08-13 NOTE — Telephone Encounter (Signed)
Better fax copy of labs from Dr. Asencion Noble.  07/30/18: Ferritin 8,, Iron 14,  CMP normal WBC 4.8, hemoglobin 10.4, MCV 67, platelet 251 Normal lipid panel Globin A1c 5.5 TSH low at 0.024 (which Dr. Willey Blade is addressing, and most likely accounts for the patient's recent weight loss)

## 2018-08-22 ENCOUNTER — Telehealth: Payer: Self-pay | Admitting: Gastroenterology

## 2018-08-22 NOTE — Telephone Encounter (Signed)
On Monday, May 11th I spoke with Dr. Verlin Dike or orthopedics and updated him on [name]'s recent anemia and need for EGD and colonoscopy in near future. He stated that the patient is far enough out from a non-displaced C1 fracture that she could have sedation with movement of head and neck, even intubation if needed.

## 2018-08-23 NOTE — Progress Notes (Signed)
Virtual Visit via Telephone Note  I connected with [name]  on 08/24/18 at  9:45 AM EDT by telephone and verified that I am speaking with the correct person using two identifiers.  Location: Patient: Home Provider: [facility] - [address], [place], [place], [place] [address]   I discussed the limitations, risks, security and privacy concerns of performing an evaluation and management service by telephone and the availability of in person appointments. I also discussed with the patient that there may be a patient responsible charge related to this service. The patient expressed understanding and agreed to proceed.  Patient consented to consult via telephone: Yes People present and their role in pt care: Pt   History of Present Illness: 68 y.o. female former smoker with obstructive sleep apnea and COPD.  Smoking History: Former Smoker. Quit 1994. 50 pack years.  Maintenance: Trelegy Ellipta  Pt of Dr. Halford Chessman   Chief complaint: OSA / COPD    68 year old female former smoker followed in our office for mild obstructive sleep apnea and COPD.  Patient is managed on Trelegy Ellipta.  Patient has albuterol nebulizers which she has not had to use.  Patient wanted to complete a tele-visit with our office today so she can receive her CPAP supplies.  CPAP compliance report shows excellent compliance.  See CPAP compliance report listed below:  07/24/2018-08/22/2018-CPAP compliance report-30 of the last 30 days use, all 30 those days greater than 4 hours, average usage 7 hours and 44 minutes, CPAP set pressure of 8, AHI 2.4 >>>Patient had contacted our office in April/2020 reporting that the set pressure of 10 was blowing too hard and she wanted to have her pressures set back to 8, triage NP approved this request >>> In comparison to 2019 CPAP compliance report patient's AHI was better controlled on the set pressure of 10  Patient reports that with the set pressure of 8 she does  feel he gets a more comfortable fit.  Patient is willing to try a set pressure of 9 or and even consider going back up to the set pressure of 10 in some time.  Patient wants to ensure that her AHI is well controlled.  MMRC - Breathlessness Score 1 - I get short of breath when hurrying on level ground or walking up a slight hill    Observations/Objective:  Sleep tests: PSG 09/02/07 >> AHI 9  Pulmonary tests: PFT 09/18/07 >> FEV1 1.38 (61%), FEV1% 59, TLC 4.48 (95%), DLCO 80%, + BD PFT 08/09/17 >> FEV1 1.35 (61%), FEV1% 69, TLC 4.67 (98%), DLCO 97%  Chest imaging: HRCT chest 08/17/17 >> calcification RV, atherosclerosis, scarring, mild air trapping, micronodularity LLL, 1.4 cm nodule LUL, fatty liver CT chest 11/29/17 >> LUL nodule resolved  Cardiac tests: Echo 09/01/17 >> EF 65 to 70%, mild AS, severe MV calcification  Assessment and Plan:  Obstructive sleep apnea Assessment: Mild obstructive sleep apnea in 2009 sleep study, AHI of 9 CPAP compliance report today with a set pressure of 8 shows excellent compliance, AHI 2.4 2019 CPAP compliance poor with a set pressure of 10 had a better controlled AHI Patient reports that the set pressure of 8 has been more comfortable for her Patient has no issues or concerns with her CPAP at this time  Plan: We will try set pressure of 9 to see if this will help with better control her AHI 39-month compliance check Order sent to DME for supplies Patient to contact her office sooner  if the set pressure of 9 is too much pressure for her Continue to work on healthy weight as well as exercise daily Follow-up with our office in 4 to 6 months with Dr. Halford Chessman  COPD (chronic obstructive pulmonary disease) with emphysema ([place]) Assessment: mMRC 1 today Maintained well on Trelegy Ellipta Patient denies wheezing, cough, increased sputum production, increased fatigue  Plan: Continue Trelegy Ellipta Continue albuterol nebulizers as needed for shortness of  breath and/or wheezing Follow-up with our office in 6 months   Follow Up Instructions:  Return in about 6 months (around 02/24/2019), or if symptoms worsen or fail to improve, for Follow up with Dr. Halford Chessman.   I discussed the assessment and treatment plan with the patient. The patient was provided an opportunity to ask questions and all were answered. The patient agreed with the plan and demonstrated an understanding of the instructions.   The patient was advised to call back or seek an in-person evaluation if the symptoms worsen or if the condition fails to improve as anticipated.  I provided 23 minutes of non-face-to-face time during this encounter.   Wendy Rinne, NP

## 2018-08-24 ENCOUNTER — Ambulatory Visit (INDEPENDENT_AMBULATORY_CARE_PROVIDER_SITE_OTHER): Payer: Medicare Other | Admitting: Pulmonary Disease

## 2018-08-24 ENCOUNTER — Encounter: Payer: Self-pay | Admitting: Pulmonary Disease

## 2018-08-24 ENCOUNTER — Other Ambulatory Visit: Payer: Self-pay

## 2018-08-24 DIAGNOSIS — J439 Emphysema, unspecified: Secondary | ICD-10-CM | POA: Diagnosis not present

## 2018-08-24 DIAGNOSIS — G4733 Obstructive sleep apnea (adult) (pediatric): Secondary | ICD-10-CM | POA: Diagnosis not present

## 2018-08-24 NOTE — Addendum Note (Signed)
Addended by: Amado Coe on: 08/24/2018 09:27 AM   Modules accepted: Orders

## 2018-08-24 NOTE — Assessment & Plan Note (Signed)
Assessment: Mild obstructive sleep apnea in 2009 sleep study, AHI of 9 CPAP compliance report today with a set pressure of 8 shows excellent compliance, AHI 2.4 2019 CPAP compliance poor with a set pressure of 10 had a better controlled AHI Patient reports that the set pressure of 8 has been more comfortable for her Patient has no issues or concerns with her CPAP at this time  Plan: We will try set pressure of 9 to see if this will help with better control her AHI 25-month compliance check Order sent to DME for supplies Patient to contact her office sooner if the set pressure of 9 is too much pressure for her Continue to work on healthy weight as well as exercise daily Follow-up with our office in 4 to 6 months with Dr. Halford Chessman

## 2018-08-24 NOTE — Assessment & Plan Note (Signed)
Assessment: mMRC 1 today Maintained well on Trelegy Ellipta Patient denies wheezing, cough, increased sputum production, increased fatigue  Plan: Continue Trelegy Ellipta Continue albuterol nebulizers as needed for shortness of breath and/or wheezing Follow-up with our office in 6 months

## 2018-08-24 NOTE — Patient Instructions (Addendum)
Change pressure setting from set pressure of 8 to set pressure of 9  Year of supplies  Mask of choice if patient needs  2 month download to check compliance   We recommend that you continue using your CPAP daily >>>Keep up the hard work using your device >>> Goal should be wearing this for the entire night that you are sleeping, at least 4 to 6 hours  Remember:  . Do not drive or operate heavy machinery if tired or drowsy.  . Please notify the supply company and office if you are unable to use your device regularly due to missing supplies or machine being broken.  . Work on maintaining a healthy weight and following your recommended nutrition plan  . Maintain proper daily exercise and movement  . Maintaining proper use of your device can also help improve management of other chronic illnesses such as: Blood pressure, blood sugars, and weight management.   BiPAP/ CPAP Cleaning:  >>>Clean weekly, with Dawn soap, and bottle brush.  Set up to air dry.    Continue Trelegy Ellipta  >>> 1 puff daily in the morning >>>rinse mouth out after use  >>> This inhaler contains 3 medications that help manage her respiratory status, contact our office if you cannot afford this medication or unable to remain on this medication  Can use albuterol nebs every 8 hours as needed   Note your daily symptoms > remember "red flags" for COPD:   >>>Increase in cough >>>increase in sputum production >>>increase in shortness of breath or activity  intolerance.   If you notice these symptoms, please call the office to be seen.    Return in about 6 months (around 02/24/2019), or if symptoms worsen or fail to improve, for Follow up with Dr. Halford Chessman.    Coronavirus (COVID-19) Are you at risk?  Are you at risk for the Coronavirus (COVID-19)?  To be considered HIGH RISK for Coronavirus (COVID-19), you have to meet the following criteria:  . Traveled to [place], [place], [place], [place] or [place]; or in the [place] to [place], [place], [place], or [place]; and have fever, cough, and shortness of breath within the last 2 weeks of travel OR . Been in close contact with a person diagnosed with COVID-19 within the last 2 weeks and have fever, cough, and shortness of breath . IF YOU DO NOT MEET THESE CRITERIA, YOU ARE CONSIDERED LOW RISK FOR COVID-19.  What to do if you are HIGH RISK for COVID-19?  Marland Kitchen If you are having a medical emergency, call 911. . Seek medical care right away. Before you go to a doctor's office, urgent care or emergency department, call ahead and tell them about your recent travel, contact with someone diagnosed with COVID-19, and your symptoms. You should receive instructions from your physician's office regarding next steps of care.  . When you arrive at healthcare provider, tell the healthcare staff immediately you have returned from visiting [place], [place], [place], [place] or [place]; or traveled in the [place] to [place], [place], [place], or [place]; in the last two weeks or you have been in close contact with a person diagnosed with COVID-19 in the last 2 weeks.   . Tell the health care staff about your symptoms: fever, cough and shortness of breath. . After you have been seen by a medical provider, you will be either: o Tested for (COVID-19) and discharged home on quarantine except to seek medical care if  symptoms worsen, and asked to  - Stay home and avoid contact with others until you get your results (4-5 days)  - Avoid travel on public transportation if possible (such as bus, train, or airplane) or o Sent to the Emergency Department by EMS for evaluation, COVID-19 testing, and possible admission depending on your condition and test results.  What to do if you are LOW RISK for COVID-19?  Reduce your risk of any infection by using the same precautions used for avoiding the common cold or flu:  Marland Kitchen Wash your hands often with soap and warm water for at  least 20 seconds.  If soap and water are not readily available, use an alcohol-based hand sanitizer with at least 60% alcohol.  . If coughing or sneezing, cover your mouth and nose by coughing or sneezing into the elbow areas of your shirt or coat, into a tissue or into your sleeve (not your hands). . Avoid shaking hands with others and consider head nods or verbal greetings only. . Avoid touching your eyes, nose, or mouth with unwashed hands.  . Avoid close contact with people who are sick. . Avoid places or events with large numbers of people in one location, like concerts or sporting events. . Carefully consider travel plans you have or are making. . If you are planning any travel outside or inside the [place], visit the CDC's Travelers' Health webpage for the latest health notices. . If you have some symptoms but not all symptoms, continue to monitor at home and seek medical attention if your symptoms worsen. . If you are having a medical emergency, call 911.   [place] / e-Visit: [email or web address]         [facility]: [facility]: [phone]                   [facility]: [phone]           It is flu season:   >>> Best ways to protect herself from the flu: Receive the yearly flu vaccine, practice good hand hygiene washing with soap and also using hand sanitizer when available, eat a nutritious meals, get adequate rest, hydrate appropriately   Please contact the office if your symptoms worsen or you have concerns that you are not improving.   Thank you for choosing [facility] for your healthcare, and for allowing [place] to partner with you on your healthcare journey. I am thankful to be able to provide care to you today.   Wyn Quaker FNP-C     Chronic Obstructive Pulmonary Disease Chronic obstructive  pulmonary disease (COPD) is a long-term (chronic) lung problem. When you have COPD, it is hard for air to get in and out of your lungs. Usually the condition gets worse over time, and your lungs will never return to normal. There are things you can do to keep yourself as healthy as possible.  Your doctor may treat your condition with: ? Medicines. ? Oxygen. ? Lung surgery.  Your doctor may also recommend: ? Rehabilitation. This includes steps to make your body work better. It may involve a team of specialists. ? Quitting smoking, if you smoke. ? Exercise and changes to your diet. ? Comfort measures (palliative care). Follow these instructions at home: Medicines  Take over-the-counter and prescription medicines only as told by your doctor.  Talk to your doctor before taking any cough or allergy medicines.  You may need to avoid medicines that cause your lungs to be dry. Lifestyle  If you smoke, stop. Smoking makes the problem worse. If you need help quitting, ask your doctor.  Avoid being around things that make your breathing worse. This may include smoke, chemicals, and fumes.  Stay active, but remember to rest as well.  Learn and use tips on how to relax.  Make sure you get enough sleep. Most adults need at least 7 hours of sleep every night.  Eat healthy foods. Eat smaller meals more often. Rest before meals. Controlled breathing Learn and use tips on how to control your breathing as told by your doctor. Try:  Breathing in (inhaling) through your nose for 1 second. Then, pucker your lips and breath out (exhale) through your lips for 2 seconds.  Putting one hand on your belly (abdomen). Breathe in slowly through your nose for 1 second. Your hand on your belly should move out. Pucker your lips and breathe out slowly through your lips. Your hand on your belly should move in as you breathe out.  Controlled coughing Learn and use controlled coughing to clear mucus from your lungs.  Follow these steps: 1. Lean your head a little forward. 2. Breathe in deeply. 3. Try to hold your breath for 3 seconds. 4. Keep your mouth slightly open while coughing 2 times. 5. Spit any mucus out into a tissue. 6. Rest and do the steps again 1 or 2 times as needed. General instructions  Make sure you get all the shots (vaccines) that your doctor recommends. Ask your doctor about a flu shot and a pneumonia shot.  Use oxygen therapy and pulmonary rehabilitation if told by your doctor. If you need home oxygen therapy, ask your doctor if you should buy a tool to measure your oxygen level (oximeter).  Make a COPD action plan with your doctor. This helps you to know what to do if you feel worse than usual.  Manage any other conditions you have as told by your doctor.  Avoid going outside when it is very hot, cold, or humid.  Avoid people who have a sickness you can catch (contagious).  Keep all follow-up visits as told by your doctor. This is important. Contact a doctor if:  You cough up more mucus than usual.  There is a change in the color or thickness of the mucus.  It is harder to breathe than usual.  Your breathing is faster than usual.  You have trouble sleeping.  You need to use your medicines more often than usual.  You have trouble doing your normal activities such as getting dressed or walking around the house. Get help right away if:  You have shortness of breath while resting.  You have shortness of breath that stops you from: ? Being able to talk. ? Doing normal activities.  Your chest hurts for longer than 5 minutes.  Your skin color is more blue than usual.  Your pulse oximeter shows that you have low oxygen for longer than 5 minutes.  You have a fever.  You feel too tired to breathe normally. Summary  Chronic obstructive pulmonary disease (COPD) is a long-term lung problem.  The way your lungs work will never return to normal. Usually the  condition gets worse over time. There are things you can do to keep yourself as healthy as possible.  Take over-the-counter and prescription medicines only as told by your doctor.  If you smoke, stop. Smoking makes the  problem worse. This information is not intended to replace advice given to you by your health care provider. Make sure you discuss any questions you have with your health care provider. Document Released: 09/14/2007 Document Revised: 05/02/2016 Document Reviewed: 05/02/2016 Elsevier Interactive Patient Education  2019 [place] With Sleep Apnea Sleep apnea is a condition in which breathing pauses or becomes shallow during sleep. Sleep apnea is most commonly caused by a collapsed or blocked airway. People with sleep apnea snore loudly and have times when they gasp and stop breathing for 10 seconds or more during sleep. This happens over and over during the night. This disrupts your sleep and keeps your body from getting the rest that it needs, which can cause tiredness and lack of energy (fatigue) during the day. The breaks in breathing also interrupt the deep sleep that you need to feel rested. Even if you do not completely wake up from the gaps in breathing, your sleep may not be restful. You may also have a headache in the morning and low energy during the day, and you may feel anxious or depressed. How can sleep apnea affect me? Sleep apnea increases your chances of extreme tiredness during the day (daytime fatigue). It can also increase your risk for health conditions, such as:  Heart attack.  Stroke.  Diabetes.  Heart failure.  Irregular heartbeat.  High blood pressure. If you have daytime fatigue as a result of sleep apnea, you may be more likely to:  Perform poorly at school or work.  Fall asleep while driving.  Have difficulty with attention.  Develop depression or anxiety.  Become severely overweight (obese).  Have sexual  dysfunction. What actions can I take to manage sleep apnea? Sleep apnea treatment   If you were given a device to open your airway while you sleep, use it only as told by your health care provider. You may be given: ? An oral appliance. This is a custom-made mouthpiece that shifts your lower jaw forward. ? A continuous positive airway pressure (CPAP) device. This device blows air through a mask when you breathe out (exhale). ? A nasal expiratory positive airway pressure (EPAP) device. This device has valves that you put into each nostril. ? A bi-level positive airway pressure (BPAP) device. This device blows air through a mask when you breathe in (inhale) and breathe out (exhale).  You may need surgery if other treatments do not work for you. Sleep habits  Go to sleep and wake up at the same time every day. This helps set your internal clock (circadian rhythm) for sleeping. ? If you stay up later than usual, such as on weekends, try to get up in the morning within 2 hours of your normal wake time.  Try to get at least 7-9 hours of sleep each night.  Stop computer, tablet, and mobile phone use a few hours before bedtime.  Do not take long naps during the day. If you nap, limit it to 30 minutes.  Have a relaxing bedtime routine. Reading or listening to music may relax you and help you sleep.  Use your bedroom only for sleep. ? Keep your television and computer out of your bedroom. ? Keep your bedroom cool, dark, and quiet. ? Use a supportive mattress and pillows.  Follow your health care provider's instructions for other changes to sleep habits. Nutrition  Do not eat heavy meals in the evening.  Do not have caffeine in the later part of  the day. The effects of caffeine can last for more than 5 hours.  Follow your health care provider's or dietitian's instructions for any diet changes. Lifestyle      Do not drink alcohol before bedtime. Alcohol can cause you to fall asleep at  first, but then it can cause you to wake up in the middle of the night and have trouble getting back to sleep.  Do not use any products that contain nicotine or tobacco, such as cigarettes and e-cigarettes. If you need help quitting, ask your health care provider. Medicines  Take over-the-counter and prescription medicines only as told by your health care provider.  Do not use over-the-counter sleep medicine. You can become dependent on this medicine, and it can make sleep apnea worse.  Do not use medicines, such as sedatives and narcotics, unless told by your health care provider. Activity  Exercise on most days, but avoid exercising in the evening. Exercising near bedtime can interfere with sleeping.  If possible, spend time outside every day. Natural light helps regulate your circadian rhythm. General information  Lose weight if you need to, and maintain a healthy weight.  Keep all follow-up visits as told by your health care provider. This is important.  If you are having surgery, make sure to tell your health care provider that you have sleep apnea. You may need to bring your device with you. Where to find more information Learn more about sleep apnea and daytime fatigue from:  American Sleep Association: sleepassociation.[place]: sleepfoundation.org  National Heart, Lung, and Blood Institute: https://www.[place].biz/ Summary  Sleep apnea can cause daytime fatigue and other serious health conditions.  Both sleep apnea and daytime fatigue can be bad for your health and well-being.  You may need to wear a device while sleeping to help keep your airway open.  If you are having surgery, make sure to tell your health care provider that you have sleep apnea. You may need to bring your device with you.  Making changes to sleep habits, diet, lifestyle, and activity can help you manage sleep apnea. This information is not intended to replace advice given to you by  your health care provider. Make sure you discuss any questions you have with your health care provider. Document Released: 06/22/2017 Document Revised: 11/28/2017 Document Reviewed: 06/22/2017 Elsevier Interactive Patient Education  [employer].

## 2018-08-28 ENCOUNTER — Telehealth: Payer: Self-pay | Admitting: Gastroenterology

## 2018-08-28 NOTE — Telephone Encounter (Signed)
I am glad to hear it.  While she still needs endoscopic procedures for the anemia and also the history of colon polyps, it is less urgent if no blood has been detected in the stool.  Therefore, I am going to put off her EGD and colonoscopy until my scheduled endoscopy block time in July.  We do not yet have that schedule, so she is on a list to track of such patients.  She should continue iron as prescribed by primary care and have her hemoglobin checked with them monthly.  Also, please let her know that I did speak with her orthopedist, Dr. Othelia Pulling, because I wanted to know when he felt it would be safe to proceed with endoscopic procedures after her C1 fracture.  He felt it was safe to proceed with endoscopic procedures whenever we feel it is appropriate.

## 2018-08-28 NOTE — Telephone Encounter (Signed)
Pt pcp offc Abigail Butts called and wanted to report that the pt hemoccult was negative.

## 2018-08-28 NOTE — Telephone Encounter (Signed)
Called patient and let her know hemmoccult was neg. PCP had already called her. Also let her know we were planning for July for her EGD/Colon. And that Dr. Othelia Pulling had cleared her from those procedures.

## 2018-08-29 ENCOUNTER — Telehealth: Payer: Self-pay | Admitting: Pulmonary Disease

## 2018-08-29 ENCOUNTER — Other Ambulatory Visit: Payer: Self-pay | Admitting: Pulmonary Disease

## 2018-08-29 DIAGNOSIS — D508 Other iron deficiency anemias: Secondary | ICD-10-CM | POA: Diagnosis not present

## 2018-08-29 DIAGNOSIS — G4733 Obstructive sleep apnea (adult) (pediatric): Secondary | ICD-10-CM

## 2018-08-29 NOTE — Telephone Encounter (Signed)
Called and spoke with Melissa with [employer] [facility] at [phone].  Melissa requested that [place] call her back first thing in the morning 08/30/18 regarding prior pt's message.  Routing message to [employer]

## 2018-08-29 NOTE — Telephone Encounter (Signed)
Returned call to patient to find out reason for DME changed Explained we can place order to switch. She says she is getting run around on getting her supplies. She had to schedule a visit now they are saying that's not sufficient that she needs new order. An order was just placed on 5/15. Calling [employer] to find out what else is needed before changing DME's

## 2018-08-30 NOTE — Telephone Encounter (Signed)
Returned call to [employer]. Per Melissa supplies are being shipped. Called pt to inform   According to [facility] there was an issue with the way order was written. Supplies and DME order to change pressure were on the same order.  Pt is satisified Nothing further needed.

## 2018-08-30 NOTE — Telephone Encounter (Signed)
Left detailed message stating that I would go ahead and place order to [facility] in [place].

## 2018-08-30 NOTE — Telephone Encounter (Signed)
[name], please advise if you are okay with [place] placing an order to have pt's DME changed from [place] to [facility] in [place] per pt's choice for her cpap?

## 2018-08-30 NOTE — Telephone Encounter (Signed)
I am perfectly fine signing for patient's supplies that she can change to the DME of her choice.  Wyn Quaker FNP

## 2018-09-03 ENCOUNTER — Other Ambulatory Visit: Payer: Self-pay | Admitting: Pulmonary Disease

## 2018-09-05 DIAGNOSIS — D509 Iron deficiency anemia, unspecified: Secondary | ICD-10-CM | POA: Diagnosis not present

## 2018-09-06 ENCOUNTER — Telehealth: Payer: Self-pay | Admitting: Gastroenterology

## 2018-09-06 DIAGNOSIS — M25561 Pain in right knee: Secondary | ICD-10-CM | POA: Diagnosis not present

## 2018-09-06 DIAGNOSIS — M25562 Pain in left knee: Secondary | ICD-10-CM | POA: Diagnosis not present

## 2018-09-06 NOTE — Telephone Encounter (Signed)
Please let [name] know that I received a copy of last weeks CBC done at Dr. Ria Comment office.  Hemoglobin has returned to normal at 12.2 on iron therapy. I recommend she stay on the current iron dosing if she is tolerating it well.  If it is causing abdominal pain or setting of constipation, then cut back to every other day.  Plan remains as per the May 19 telephone note, where we will schedule EGD and colonoscopy on my July [facility] outpatient block once we have that schedule available.

## 2018-09-06 NOTE — Telephone Encounter (Signed)
Spoke with pt and she is aware.

## 2018-09-06 NOTE — Telephone Encounter (Signed)
Left message for pt to call back  °

## 2018-09-12 DIAGNOSIS — M0589 Other rheumatoid arthritis with rheumatoid factor of multiple sites: Secondary | ICD-10-CM | POA: Diagnosis not present

## 2018-09-17 DIAGNOSIS — M542 Cervicalgia: Secondary | ICD-10-CM | POA: Diagnosis not present

## 2018-09-17 DIAGNOSIS — M546 Pain in thoracic spine: Secondary | ICD-10-CM | POA: Diagnosis not present

## 2018-09-24 ENCOUNTER — Telehealth: Payer: Self-pay | Admitting: Gastroenterology

## 2018-09-24 NOTE — Telephone Encounter (Signed)
Please contact this patient to schedule EGD and Colonoscopy during my July 20th  [facility] outpatient block.  Indications;  Iron deficiency anemia and history of colon polyps.  Poor prep in July 2019 - needs strict attention to pre-procedure dietary advice (especially no broccoli) and a 4 liter Golytely split prep.    Prescribe two tablets of reglan 5mg  - one tablet an hour before evening and AM prep doses.

## 2018-09-25 ENCOUNTER — Other Ambulatory Visit: Payer: Self-pay

## 2018-09-25 DIAGNOSIS — D500 Iron deficiency anemia secondary to blood loss (chronic): Secondary | ICD-10-CM

## 2018-09-25 DIAGNOSIS — Z86010 Personal history of colonic polyps: Secondary | ICD-10-CM

## 2018-09-25 NOTE — Telephone Encounter (Signed)
Pt has been scheduled for the hospital on 10-29-2018 as requested. Instructions have been mailed to home address on file. Pt aware of COVID19 prescreening and the requirements for quarantining.

## 2018-10-09 ENCOUNTER — Telehealth: Payer: Self-pay | Admitting: Gastroenterology

## 2018-10-09 MED ORDER — PEG 3350-KCL-NA BICARB-NACL 420 G PO SOLR: 4000.0000 mL | Freq: Once | ORAL | 0 refills | Status: AC

## 2018-10-09 MED ORDER — METOCLOPRAMIDE HCL 5 MG PO TABS: 5.0000 mg | ORAL_TABLET | Freq: Four times a day (QID) | ORAL | 0 refills | Status: DC

## 2018-10-09 NOTE — Telephone Encounter (Signed)
Pt is scheduled for hospital colon and requested script for prep soln and Reglan to be sent to [facility].

## 2018-10-09 NOTE — Telephone Encounter (Signed)
Resent Rx to pharmacy as requested.

## 2018-10-13 ENCOUNTER — Other Ambulatory Visit: Payer: Self-pay | Admitting: Pulmonary Disease

## 2018-10-17 DIAGNOSIS — M0589 Other rheumatoid arthritis with rheumatoid factor of multiple sites: Secondary | ICD-10-CM | POA: Diagnosis not present

## 2018-10-17 DIAGNOSIS — M545 Low back pain: Secondary | ICD-10-CM | POA: Diagnosis not present

## 2018-10-17 DIAGNOSIS — Z79899 Other long term (current) drug therapy: Secondary | ICD-10-CM | POA: Diagnosis not present

## 2018-10-17 DIAGNOSIS — M150 Primary generalized (osteo)arthritis: Secondary | ICD-10-CM | POA: Diagnosis not present

## 2018-10-17 DIAGNOSIS — L299 Pruritus, unspecified: Secondary | ICD-10-CM | POA: Diagnosis not present

## 2018-10-17 DIAGNOSIS — Z6841 Body Mass Index (BMI) 40.0 and over, adult: Secondary | ICD-10-CM | POA: Diagnosis not present

## 2018-10-24 DIAGNOSIS — M0589 Other rheumatoid arthritis with rheumatoid factor of multiple sites: Secondary | ICD-10-CM | POA: Diagnosis not present

## 2018-10-25 ENCOUNTER — Other Ambulatory Visit (HOSPITAL_COMMUNITY)
Admission: RE | Admit: 2018-10-25 | Discharge: 2018-10-25 | Disposition: A | Discharge status: Home / Self Care | Payer: Medicare Other | Source: Ambulatory Visit | Attending: Gastroenterology | Admitting: Gastroenterology

## 2018-10-25 DIAGNOSIS — Z1159 Encounter for screening for other viral diseases: Secondary | ICD-10-CM | POA: Diagnosis not present

## 2018-10-25 LAB — SARS CORONAVIRUS 2 (TAT 6-24 HRS): SARS Coronavirus 2: NEGATIVE

## 2018-10-26 NOTE — Progress Notes (Signed)
Spoke with patient about Endoscopy procedure on Monday 7/20 and ensured patient has remained quarantined since COVID test, and that she has not been around anyone sick nor has had symptoms of being sick. All questions addressed.  

## 2018-10-28 NOTE — Anesthesia Preprocedure Evaluation (Addendum)
Anesthesia Evaluation  Patient identified by MRN, date of birth, ID band Patient awake    Reviewed: Allergy & Precautions, NPO status , Patient's Chart, lab work & pertinent test results  History of Anesthesia Complications (+) PONV and history of anesthetic complications  Airway Mallampati: III  TM Distance: >3 FB Neck ROM: Full    Dental  (+) Dental Advisory Given, Teeth Intact   Pulmonary asthma , sleep apnea and Continuous Positive Airway Pressure Ventilation , COPD,  COPD inhaler, former smoker,    breath sounds clear to auscultation       Cardiovascular hypertension, Pt. on medications + Valvular Problems/Murmurs  Rhythm:Regular Rate:Normal + Systolic murmurs  '19 TTE -  EF 65% to 70%. Wall motion was normal. Mild AS.    Neuro/Psych PSYCHIATRIC DISORDERS Depression negative neurological ROS     GI/Hepatic Neg liver ROS, GERD  Medicated and Controlled,  Endo/Other  diabetes, Type 2, Insulin Dependent, Oral Hypoglycemic AgentsMorbid obesity  Renal/GU negative Renal ROS     Musculoskeletal  (+) Arthritis , Rheumatoid disorders,    Abdominal   Peds  Hematology negative hematology ROS (+)   Anesthesia Other Findings   Reproductive/Obstetrics                            Anesthesia Physical Anesthesia Plan  ASA: III  Anesthesia Plan: MAC   Post-op Pain Management:    Induction: Intravenous  PONV Risk Score and Plan: 2 and Propofol infusion and Treatment may vary due to age or medical condition  Airway Management Planned: Nasal Cannula and Natural Airway  Additional Equipment: None  Intra-op Plan:   Post-operative Plan:   Informed Consent: I have reviewed the patients History and Physical, chart, labs and discussed the procedure including the risks, benefits and alternatives for the proposed anesthesia with the patient or authorized representative who has indicated his/her  understanding and acceptance.       Plan Discussed with: CRNA and Anesthesiologist  Anesthesia Plan Comments:        Anesthesia Quick Evaluation

## 2018-10-29 ENCOUNTER — Ambulatory Visit (HOSPITAL_COMMUNITY): Payer: Medicare Other | Admitting: Anesthesiology

## 2018-10-29 ENCOUNTER — Other Ambulatory Visit: Payer: Self-pay

## 2018-10-29 ENCOUNTER — Encounter (HOSPITAL_COMMUNITY): Payer: Self-pay | Admitting: Emergency Medicine

## 2018-10-29 ENCOUNTER — Encounter (HOSPITAL_COMMUNITY)
Admission: RE | Disposition: A | Discharge status: Home / Self Care | Payer: Self-pay | Source: Home / Self Care | Attending: Gastroenterology

## 2018-10-29 ENCOUNTER — Ambulatory Visit (HOSPITAL_COMMUNITY)
Admission: RE | Admit: 2018-10-29 | Discharge: 2018-10-29 | Disposition: A | Discharge status: Home / Self Care | Payer: Medicare Other | Attending: Gastroenterology | Admitting: Gastroenterology

## 2018-10-29 DIAGNOSIS — Z96651 Presence of right artificial knee joint: Secondary | ICD-10-CM | POA: Diagnosis not present

## 2018-10-29 DIAGNOSIS — K317 Polyp of stomach and duodenum: Secondary | ICD-10-CM | POA: Diagnosis not present

## 2018-10-29 DIAGNOSIS — E109 Type 1 diabetes mellitus without complications: Secondary | ICD-10-CM | POA: Insufficient documentation

## 2018-10-29 DIAGNOSIS — D500 Iron deficiency anemia secondary to blood loss (chronic): Secondary | ICD-10-CM | POA: Diagnosis not present

## 2018-10-29 DIAGNOSIS — D509 Iron deficiency anemia, unspecified: Secondary | ICD-10-CM | POA: Diagnosis not present

## 2018-10-29 DIAGNOSIS — G4733 Obstructive sleep apnea (adult) (pediatric): Secondary | ICD-10-CM | POA: Insufficient documentation

## 2018-10-29 DIAGNOSIS — Z86010 Personal history of colonic polyps: Secondary | ICD-10-CM | POA: Insufficient documentation

## 2018-10-29 DIAGNOSIS — D123 Benign neoplasm of transverse colon: Secondary | ICD-10-CM | POA: Insufficient documentation

## 2018-10-29 DIAGNOSIS — I10 Essential (primary) hypertension: Secondary | ICD-10-CM | POA: Insufficient documentation

## 2018-10-29 DIAGNOSIS — Z6841 Body Mass Index (BMI) 40.0 and over, adult: Secondary | ICD-10-CM | POA: Diagnosis not present

## 2018-10-29 DIAGNOSIS — Q438 Other specified congenital malformations of intestine: Secondary | ICD-10-CM | POA: Diagnosis not present

## 2018-10-29 DIAGNOSIS — K3189 Other diseases of stomach and duodenum: Secondary | ICD-10-CM | POA: Insufficient documentation

## 2018-10-29 DIAGNOSIS — J449 Chronic obstructive pulmonary disease, unspecified: Secondary | ICD-10-CM | POA: Diagnosis not present

## 2018-10-29 DIAGNOSIS — K5730 Diverticulosis of large intestine without perforation or abscess without bleeding: Secondary | ICD-10-CM | POA: Insufficient documentation

## 2018-10-29 DIAGNOSIS — K219 Gastro-esophageal reflux disease without esophagitis: Secondary | ICD-10-CM | POA: Insufficient documentation

## 2018-10-29 DIAGNOSIS — K2970 Gastritis, unspecified, without bleeding: Secondary | ICD-10-CM | POA: Insufficient documentation

## 2018-10-29 DIAGNOSIS — K635 Polyp of colon: Secondary | ICD-10-CM | POA: Diagnosis not present

## 2018-10-29 DIAGNOSIS — Z794 Long term (current) use of insulin: Secondary | ICD-10-CM | POA: Diagnosis not present

## 2018-10-29 HISTORY — DX: Nausea with vomiting, unspecified: R11.2

## 2018-10-29 HISTORY — PX: COLONOSCOPY WITH PROPOFOL: SHX5780

## 2018-10-29 HISTORY — PX: BIOPSY: SHX5522

## 2018-10-29 HISTORY — DX: Other specified postprocedural states: Z98.890

## 2018-10-29 HISTORY — PX: ESOPHAGOGASTRODUODENOSCOPY (EGD) WITH PROPOFOL: SHX5813

## 2018-10-29 HISTORY — PX: POLYPECTOMY: SHX5525

## 2018-10-29 LAB — GLUCOSE, CAPILLARY: Glucose-Capillary: 76 mg/dL (ref 70–99)

## 2018-10-29 SURGERY — ESOPHAGOGASTRODUODENOSCOPY (EGD) WITH PROPOFOL
Anesthesia: Monitor Anesthesia Care

## 2018-10-29 MED ORDER — PHENYLEPHRINE HCL (PRESSORS) 10 MG/ML IV SOLN
INTRAVENOUS | Status: DC | PRN
  Administered 2018-10-29: 120 ug via INTRAVENOUS
  Administered 2018-10-29: 160 ug via INTRAVENOUS
  Administered 2018-10-29: 120 ug via INTRAVENOUS
  Administered 2018-10-29 (×2): 80 ug via INTRAVENOUS

## 2018-10-29 MED ORDER — SODIUM CHLORIDE 0.9 % IV SOLN: INTRAVENOUS | Status: DC

## 2018-10-29 MED ORDER — PROPOFOL 10 MG/ML IV BOLUS
INTRAVENOUS | Status: AC
  Filled 2018-10-29: qty 20

## 2018-10-29 MED ORDER — PROPOFOL 10 MG/ML IV BOLUS
INTRAVENOUS | Status: AC
  Filled 2018-10-29: qty 60

## 2018-10-29 MED ORDER — LIDOCAINE HCL (CARDIAC) PF 100 MG/5ML IV SOSY
PREFILLED_SYRINGE | INTRAVENOUS | Status: DC | PRN
  Administered 2018-10-29: 75 mg via INTRAVENOUS

## 2018-10-29 MED ORDER — LACTATED RINGERS IV SOLN
INTRAVENOUS | Status: DC
  Administered 2018-10-29: 08:00:00 via INTRAVENOUS

## 2018-10-29 MED ORDER — PROPOFOL 500 MG/50ML IV EMUL
INTRAVENOUS | Status: DC | PRN
  Administered 2018-10-29: 150 ug/kg/min via INTRAVENOUS

## 2018-10-29 MED ORDER — LACTATED RINGERS IV SOLN
INTRAVENOUS | Status: DC | PRN
  Administered 2018-10-29 (×2): via INTRAVENOUS

## 2018-10-29 SURGICAL SUPPLY — 25 items

## 2018-10-29 NOTE — Transfer of Care (Signed)
Immediate Anesthesia Transfer of Care Note  Patient: [name]  Procedure(s) Performed: ESOPHAGOGASTRODUODENOSCOPY (EGD) WITH PROPOFOL (N/A ) COLONOSCOPY WITH PROPOFOL (N/A ) POLYPECTOMY BIOPSY  Patient Location: PACU  Anesthesia Type:MAC  Level of Consciousness: awake, alert , oriented and patient cooperative  Airway & Oxygen Therapy: Patient Spontanous Breathing and Patient connected to nasal cannula oxygen  Post-op Assessment: Report given to RN, Post -op Vital signs reviewed and stable and Patient moving all extremities X 4  Post vital signs: stable  Last Vitals:  Vitals Value Taken Time  BP 113/54 10/29/18 0900  Temp 36.4 C 10/29/18 0900  Pulse 82 10/29/18 0903  Resp 19 10/29/18 0903  SpO2 100 % 10/29/18 0903  Vitals shown include unvalidated device data.  Last Pain:  Vitals:   10/29/18 0900  TempSrc: Oral  PainSc: 0-No pain         Complications: No apparent anesthesia complications

## 2018-10-29 NOTE — Discharge Instructions (Signed)
YOU HAD AN ENDOSCOPIC PROCEDURE TODAY: Refer to the procedure report and other information in the discharge instructions given to you for any specific questions about what was found during the examination. If this information does not answer your questions, please call [place] office at [phone] to clarify.   YOU SHOULD EXPECT: Some feelings of bloating in the abdomen. Passage of more gas than usual. Walking can help get rid of the air that was put into your GI tract during the procedure and reduce the bloating. If you had a lower endoscopy (such as a colonoscopy or flexible sigmoidoscopy) you may notice spotting of blood in your stool or on the toilet paper. Some abdominal soreness may be present for a day or two, also.  DIET: Your first meal following the procedure should be a light meal and then it is ok to progress to your normal diet. A half-sandwich or bowl of soup is an example of a good first meal. Heavy or fried foods are harder to digest and may make you feel nauseous or bloated. Drink plenty of fluids but you should avoid alcoholic beverages for 24 hours. If you had a esophageal dilation, please see attached instructions for diet.    ACTIVITY: Your care partner should take you home directly after the procedure. You should plan to take it easy, moving slowly for the rest of the day. You can resume normal activity the day after the procedure however YOU SHOULD NOT DRIVE, use power tools, machinery or perform tasks that involve climbing or major physical exertion for 24 hours (because of the sedation medicines used during the test).   A gastroenterologist can be reached at any hour. Please call [phone]  for any of the following symptoms:  Following upper endoscopy (EGD, EUS, ERCP, esophageal dilation) Vomiting of blood or coffee ground material  New, significant abdominal pain  New, significant chest pain or pain under the shoulder blades  Painful or persistently difficult swallowing    New shortness of breath  Black, tarry-looking or red, bloody stools  FOLLOW UP:  If any biopsies were taken you will be contacted by phone or by letter within the next 1-3 weeks. Call [phone]  if you have not heard about the biopsies in 3 weeks.  Please also call with any specific questions about appointments or follow up tests.

## 2018-10-29 NOTE — H&P (Signed)
History:  This patient presents for endoscopic testing for Iron deficiency anemia.  [name] Referring physician: Asencion Noble, MD  Past Medical History: Past Medical History:  Diagnosis Date  . Asthma   . COPD (chronic obstructive pulmonary disease) ([place])   . Depression   . DM type 1 (diabetes mellitus, type 1) ([place])   . GERD (gastroesophageal reflux disease)   . Hyperlipidemia   . Hypertension   . Morbid obesity ([employer])   . Obstructive sleep apnea syndrome   . Osteoarthritis of right knee 04/2010   end-stage  . PONV (postoperative nausea and vomiting)   . Rheumatoid arthritis(714.0)    on methotrexate therapy  . Septic arthritis of knee, right ([place]) 04/2010     Past Surgical History: Past Surgical History:  Procedure Laterality Date  . ABDOMINAL HYSTERECTOMY  1992  . ABDOMINAL WALL MESH  REMOVAL  08/13/2009   with removal of retained stitches  . [place]   for cardiac tamponade  . APPENDECTOMY  1967  . BONE BIOPSY Right 04/19/2016   Procedure: BONE BIOPSY RIGHT GREAT TOE;  Surgeon: Caprice Beaver, DPM;  Location: [facility];  Service: Podiatry;  Laterality: Right;  . CHOLECYSTECTOMY  1992   laparoscopic  . COLONOSCOPY  12/08/2011   Procedure: COLONOSCOPY;  Surgeon: Rogene Houston, MD;  Location: [facility];  Service: Endoscopy;  Laterality: N/A;  830  . COLONOSCOPY WITH PROPOFOL N/A 10/16/2017   Procedure: COLONOSCOPY WITH PROPOFOL;  Surgeon: Doran Stabler, MD;  Location: [facility];  Service: Gastroenterology;  Laterality: N/A;  . FOREIGN BODY REMOVAL ABDOMINAL  01/04/2010   stitch abscesses  . HERNIA REPAIR  2001   open LOA / [place] repair w mesh.  Dr. Arnoldo Morale  . HERNIA REPAIR  2007   open redo LOA / [place] repair w mesh.  Dr. Arnoldo Morale  . POLYPECTOMY  10/16/2017   Procedure: POLYPECTOMY;  Surgeon: Doran Stabler, MD;  Location: [facility];  Service: Gastroenterology;;  . REVISION TOTAL KNEE ARTHROPLASTY  06/09/2010   I and D   .  TOTAL KNEE ARTHROPLASTY  05/07/2010   right knee    Allergies: Allergies  Allergen Reactions  . Demerol Nausea And Vomiting    Severe  . Hydromorphone Hcl Itching    All over the body.  . Tetracycline Hives    Outpatient Meds: Current Facility-Administered Medications  Medication Dose Route Frequency Provider Last Rate Last Dose  . lactated ringers infusion   Intravenous Continuous Nelida Meuse III, MD 10 mL/hr at 10/29/18 0731        ___________________________________________________________________ Objective   Exam:  BP (!) 129/52   Pulse 87   Temp 98.4 F (36.9 C) (Oral)   Resp 19   Ht 5\' 2"  (1.575 m)   Wt 117 kg   SpO2 96%   BMI 47.19 kg/m    CV: RRR without murmur, S1/S2, no JVD, no peripheral edema  Resp: clear to auscultation bilaterally, normal RR and effort noted  GI: soft, no tenderness, with active bowel sounds. No guarding or palpable organomegaly noted.  Neuro: awake, alert and oriented x 3. Normal gross motor function and fluent speech   Assessment:  IDA  Plan:  EGD and colonoscopy   Nelida Meuse III

## 2018-10-29 NOTE — Interval H&P Note (Signed)
History and Physical Interval Note:  10/29/2018 7:59 AM  [name]  has presented today for surgery, with the diagnosis of anemia,hx of polyps.  The various methods of treatment have been discussed with the patient and family. After consideration of risks, benefits and other options for treatment, the patient has consented to  Procedure(s): ESOPHAGOGASTRODUODENOSCOPY (EGD) WITH PROPOFOL (N/A) COLONOSCOPY WITH PROPOFOL (N/A) as a surgical intervention.  The patient's history has been reviewed, patient examined, no change in status, stable for surgery.  I have reviewed the patient's chart and labs.  Questions were answered to the patient's satisfaction.     Nelida Meuse III

## 2018-10-29 NOTE — Op Note (Signed)
[facility] Patient Name: [name] Procedure Date: 10/29/2018 MRN: [id number] Attending MD: [name] , MD Date of Birth: 1950/11/24 CSN: [id number] Age: 68 Admit Type: Outpatient Procedure:                Upper GI endoscopy Indications:              Unexplained iron deficiency anemia (hemocult                            negative - improved quickly with oral iron) Providers:                Wendy Cotta. Loletha Carrow, MD, Vista Lawman, RN, Cherylynn Ridges, Technician, Enrigue Catena, CRNA Referring MD:             Asencion Noble, MD Medicines:                Monitored Anesthesia Care Complications:            No immediate complications. Estimated Blood Loss:     Estimated blood loss was minimal. Procedure:                Pre-Anesthesia Assessment:                           - Prior to the procedure, a History and Physical                            was performed, and patient medications and                            allergies were reviewed. The patient's tolerance of                            previous anesthesia was also reviewed. The risks                            and benefits of the procedure and the sedation                            options and risks were discussed with the patient.                            All questions were answered, and informed consent                            was obtained. Prior Anticoagulants: The patient has                            taken no previous anticoagulant or antiplatelet                            agents. ASA Grade Assessment: III - A patient with  severe systemic disease. After reviewing the risks                            and benefits, the patient was deemed in                            satisfactory condition to undergo the procedure.                           - Prior to the procedure, a History and Physical                            was performed, and patient medications and                        allergies were reviewed. The patient's tolerance of                            previous anesthesia was also reviewed. The risks                            and benefits of the procedure and the sedation                            options and risks were discussed with the patient.                            All questions were answered, and informed consent                            was obtained. Prior Anticoagulants: The patient has                            taken no previous anticoagulant or antiplatelet                            agents. ASA Grade Assessment: III - A patient with                            severe systemic disease. After reviewing the risks                            and benefits, the patient was deemed in                            satisfactory condition to undergo the procedure.                           After obtaining informed consent, the endoscope was                            passed under direct vision. Throughout the  procedure, the patient's blood pressure, pulse, and                            oxygen saturations were monitored continuously. The                            [id number]-[id number] ([id number]) Olympus gastroscope was                            introduced through the mouth, and advanced to the                            second part of duodenum. The upper GI endoscopy was                            accomplished without difficulty. The patient                            tolerated the procedure fairly well. Scope In: Scope Out: Findings:      The esophagus was normal.      Localized mild inflammation characterized by congestion (edema) and       erythema was found on the lesser curvature of the stomach. Biopsies were       taken with a cold forceps for histology.      Two diminutive, benign-appearing mucosal papules (nodules) were found in       the prepyloric region of the stomach. Biopsies were taken with a cold        forceps for histology.      The exam of the stomach was otherwise normal.      The examined duodenum was normal. Impression:               - Normal esophagus.                           - mild gastritis. Biopsied.                           - Two mucosal papules (nodules) found in the                            stomach. Biopsied.                           - Normal examined duodenum.                           No source of GI blood loss seen to explain hemocult                            negative IDA. Moderate Sedation:      Not Applicable - Patient had care per Anesthesia. Recommendation:           - Patient has a contact number available for                            emergencies. The signs  and symptoms of potential                            delayed complications were discussed with the                            patient. Return to normal activities tomorrow.                            Written discharge instructions were provided to the                            patient.                           - Resume previous diet.                           - Continue present medications.                           - Await pathology results.                           - See the other procedure note for documentation of                            additional recommendations.                           - Return to primary care physician to follow                            hemoglobin and determine duration of iron therapy. Procedure Code(s):        --- Professional ---                           [phone], Esophagogastroduodenoscopy, flexible,                            transoral; with biopsy, single or multiple Diagnosis Code(s):        --- Professional ---                           K31.89, Other diseases of stomach and duodenum                           D50.9, Iron deficiency anemia, unspecified CPT copyright 2019 American Medical Association. All rights reserved. The codes documented in this report are  preliminary and upon coder review may  be revised to meet current compliance requirements. Henry L. Loletha Carrow, MD 10/29/2018 9:06:19 AM This report has been signed electronically. Number of Addenda: 0

## 2018-10-29 NOTE — Anesthesia Procedure Notes (Signed)
Procedure Name: MAC Date/Time: 10/29/2018 8:09 AM Performed by: Lissa Morales, CRNA Pre-anesthesia Checklist: Patient identified, Emergency Drugs available, Suction available, Patient being monitored and Timeout performed Oxygen Delivery Method: Simple face mask Placement Confirmation: positive ETCO2

## 2018-10-29 NOTE — Op Note (Signed)
[facility] Patient Name: [name] Procedure Date: 10/29/2018 MRN: [id number] Attending MD: Estill Cotta. Loletha Carrow , MD Date of Birth: July 08, 1950 CSN: [id number] Age: 68 Admit Type: Outpatient Procedure:                Colonoscopy Indications:              Unexplained iron deficiency anemia (hemocult                            negative, improved quickly with oral iron); patient                            also had multiple colon polyps one year ago on                            screening colonoscopy with poor preparation Providers:                Mallie Mussel L. Loletha Carrow, MD, Vista Lawman, RN, Cherylynn Ridges, Technician, Enrigue Catena, CRNA Referring MD:             Dr. Asencion Noble Medicines:                Monitored Anesthesia Care Complications:            No immediate complications. Estimated Blood Loss:     Estimated blood loss was minimal. Procedure:                Pre-Anesthesia Assessment:                           - Prior to the procedure, a History and Physical                            was performed, and patient medications and                            allergies were reviewed. The patient's tolerance of                            previous anesthesia was also reviewed. The risks                            and benefits of the procedure and the sedation                            options and risks were discussed with the patient.                            All questions were answered, and informed consent                            was obtained. Prior Anticoagulants: The patient has  taken no previous anticoagulant or antiplatelet                            agents. ASA Grade Assessment: III - A patient with                            severe systemic disease. After reviewing the risks                            and benefits, the patient was deemed in                            satisfactory condition to undergo the procedure.                       After obtaining informed consent, the colonoscope                            was passed under direct vision. Throughout the                            procedure, the patient's blood pressure, pulse, and                            oxygen saturations were monitored continuously. The                            CF-HQ190L ([id number] Olympus colonoscope was                            introduced through the anus and advanced to the the                            cecum, identified by appendiceal orifice and                            ileocecal valve. The colonoscopy was somewhat                            difficult due to a redundant colon, significant                            looping and the patient's body habitus. Successful                            completion of the procedure was aided by using                            manual pressure and an abdominal binder. The                            patient tolerated the procedure well. The quality  of the bowel preparation was good. The ileocecal                            valve, appendiceal orifice, and rectum were                            photographed. Scope In: 8:18:38 AM Scope Out: 8:39:33 AM Scope Withdrawal Time: 0 hours 9 minutes 12 seconds  Total Procedure Duration: 0 hours 20 minutes 55 seconds  Findings:      The perianal and digital rectal examinations were normal.      Multiple diverticula were found in the left colon and right colon.      A 4 mm polyp was found in the proximal transverse colon. The polyp was       sessile. The polyp was removed with a cold snare. Resection and       retrieval were complete.      The exam was otherwise without abnormality on direct and retroflexion       views. Impression:               - Diverticulosis in the left colon and in the right                            colon.                           - One 4 mm polyp in the proximal transverse colon,                             removed with a cold snare. Resected and retrieved.                           - The examination was otherwise normal on direct                            and retroflexion views. Moderate Sedation:      Not Applicable - Patient had care per Anesthesia. Recommendation:           - Patient has a contact number available for                            emergencies. The signs and symptoms of potential                            delayed complications were discussed with the                            patient. Return to normal activities tomorrow.                            Written discharge instructions were provided to the                            patient.                           -  Resume previous diet.                           - Continue present medications.                           - Await pathology results.                           - Repeat colonoscopy is recommended for                            surveillance. The colonoscopy date will be                            determined after pathology results from today's                            exam become available for review.                           - Return to primary care physician to monitor                            hemoglobin and determine duration of iron treatment.                           - See the other procedure note for documentation of                            additional recommendations. Procedure Code(s):        --- Professional ---                           [phone], Colonoscopy, flexible; with removal of                            tumor(s), polyp(s), or other lesion(s) by snare                            technique Diagnosis Code(s):        --- Professional ---                           K63.5, Polyp of colon                           D50.9, Iron deficiency anemia, unspecified                           K57.30, Diverticulosis of large intestine without                            perforation or  abscess without bleeding CPT copyright 2019 American Medical Association. All rights reserved. The codes documented in this report are preliminary and upon coder review may  be revised to meet current compliance requirements. Mallie Mussel  Laurian Brim, MD 10/29/2018 8:59:59 AM This report has been signed electronically. Number of Addenda: 0

## 2018-10-29 NOTE — Anesthesia Postprocedure Evaluation (Signed)
Anesthesia Post Note  Patient: [name]  Procedure(s) Performed: ESOPHAGOGASTRODUODENOSCOPY (EGD) WITH PROPOFOL (N/A ) COLONOSCOPY WITH PROPOFOL (N/A ) POLYPECTOMY BIOPSY     Patient location during evaluation: PACU Anesthesia Type: MAC Level of consciousness: awake and alert Pain management: pain level controlled Vital Signs Assessment: post-procedure vital signs reviewed and stable Respiratory status: spontaneous breathing, nonlabored ventilation and respiratory function stable Cardiovascular status: stable and blood pressure returned to baseline Anesthetic complications: no    Last Vitals:  Vitals:   10/29/18 0920 10/29/18 0930  BP: 124/72 135/60  Pulse: 80 81  Resp: (!) 23 16  Temp:    SpO2: 99% 97%    Last Pain:  Vitals:   10/29/18 0900  TempSrc: Oral  PainSc: 0-No pain                 Audry Pili

## 2018-10-31 ENCOUNTER — Encounter (HOSPITAL_COMMUNITY): Payer: Self-pay | Admitting: Gastroenterology

## 2018-11-01 ENCOUNTER — Encounter: Payer: Self-pay | Admitting: Gastroenterology

## 2018-11-02 ENCOUNTER — Telehealth: Payer: Self-pay | Admitting: Pulmonary Disease

## 2018-11-02 DIAGNOSIS — D508 Other iron deficiency anemias: Secondary | ICD-10-CM | POA: Diagnosis not present

## 2018-11-02 DIAGNOSIS — E1140 Type 2 diabetes mellitus with diabetic neuropathy, unspecified: Secondary | ICD-10-CM | POA: Diagnosis not present

## 2018-11-02 DIAGNOSIS — Z79899 Other long term (current) drug therapy: Secondary | ICD-10-CM | POA: Diagnosis not present

## 2018-11-02 DIAGNOSIS — E785 Hyperlipidemia, unspecified: Secondary | ICD-10-CM | POA: Diagnosis not present

## 2018-11-02 NOTE — Telephone Encounter (Signed)
lmtcb for pt.  Routing to [place] for follow-up as this was not a triage-generated message.

## 2018-11-02 NOTE — Telephone Encounter (Signed)
11/02/2018 1119  Triage,  Can we please contact the patient let her know that we have received her CPAP compliance report.  CPAP compliance report shows good compliance with well controlled events.  10/02/2018-10/31/2018 20-26 of the last 30 days use, 25 those days greater than 4 hours, average usage 8 hours and 4 minutes, CPAP set pressure of 9, AHI 2  Patient is well controlled on set pressure of 9.  Wyn Quaker, FNP

## 2018-11-02 NOTE — Telephone Encounter (Signed)
Patient is returning phone call.  Patient phone number is [phone].

## 2018-11-02 NOTE — Telephone Encounter (Signed)
Spoke with pt, aware of results/recs.  Nothing further needed.  

## 2018-11-09 DIAGNOSIS — M069 Rheumatoid arthritis, unspecified: Secondary | ICD-10-CM | POA: Diagnosis not present

## 2018-11-09 DIAGNOSIS — E1169 Type 2 diabetes mellitus with other specified complication: Secondary | ICD-10-CM | POA: Diagnosis not present

## 2018-11-09 DIAGNOSIS — D509 Iron deficiency anemia, unspecified: Secondary | ICD-10-CM | POA: Diagnosis not present

## 2018-11-20 NOTE — Progress Notes (Signed)
Reviewed and agree with assessment/plan.   Rory Montel, MD [facility] 04/06/2016, 12:24 PM Pager:  [phone]  

## 2018-11-22 ENCOUNTER — Other Ambulatory Visit (INDEPENDENT_AMBULATORY_CARE_PROVIDER_SITE_OTHER): Payer: Medicare Other

## 2018-11-22 ENCOUNTER — Other Ambulatory Visit: Payer: Self-pay

## 2018-11-22 ENCOUNTER — Other Ambulatory Visit: Payer: Self-pay | Admitting: Endocrinology

## 2018-11-22 DIAGNOSIS — E0590 Thyrotoxicosis, unspecified without thyrotoxic crisis or storm: Secondary | ICD-10-CM

## 2018-11-22 LAB — TSH: TSH: <0.01 u[IU]/mL — ABNORMAL LOW (ref 0.35–4.50)

## 2018-11-22 LAB — T4, FREE: Free T4: 0.98 ng/dL (ref 0.60–1.60)

## 2018-11-22 LAB — T3, FREE: T3, Free: 3.2 pg/mL (ref 2.3–4.2)

## 2018-11-26 ENCOUNTER — Other Ambulatory Visit: Payer: Self-pay

## 2018-11-26 ENCOUNTER — Ambulatory Visit (INDEPENDENT_AMBULATORY_CARE_PROVIDER_SITE_OTHER): Payer: Medicare Other | Admitting: Endocrinology

## 2018-11-26 ENCOUNTER — Encounter: Payer: Self-pay | Admitting: Endocrinology

## 2018-11-26 DIAGNOSIS — Z794 Long term (current) use of insulin: Secondary | ICD-10-CM | POA: Diagnosis not present

## 2018-11-26 DIAGNOSIS — E119 Type 2 diabetes mellitus without complications: Secondary | ICD-10-CM

## 2018-11-26 DIAGNOSIS — E0590 Thyrotoxicosis, unspecified without thyrotoxic crisis or storm: Secondary | ICD-10-CM

## 2018-11-26 NOTE — Progress Notes (Signed)
Patient ID: [name], female   DOB: July 07, 1950, 69 y.o.   MRN: [id number]                                                                                                              Reason for Appointment: Abnormal thyroid  Referring physician: Eleonore Chiquito  Today's office visit was provided via telemedicine using video technique The patient was explained the limitations of evaluation and management by telemedicine and the availability of in person appointments.  The patient understood the limitations and agreed to proceed. Patient also understood that the telehealth visit is billable. . Location of the patient: Patient's home . Location of the provider: Physician office Only the patient and myself were participating in the encounter     Chief complaint: Follow-up of thyroid   History of Present Illness:   She had a low level of TSH done during her hospitalization for COPD in April 2019 Also at that time she was tachycardic, was apparently fairly ill at that time with her COPD exacerbation  Evaluation did not show any evidence of toxic nodular goiter, hot nodule or Graves' disease With a trial of PTU 50 mg twice daily she subjectively did not feel any different and her free T4 was below normal at 0.68 Also been her TSH was not significantly suppressed  She was told to stop PTU and follow-up with her PCP Since TSH was still relatively low in April 2020 she is now coming back for follow-up follow-up  She still does not have any recent symptoms suggestive of hyperthyroidism such as unusual weight loss, palpitations, unusual fatigue, shakiness or heat intolerance  Previously did not have a goiter on exam  Her labs now show suppressed TSH but her free T4 is in the middle of the normal range at 0.98 and T3 is quite normal at 3.2  Her thyroid scan did not show any overactive areas and uptake of 19%  Wt Readings from Last 3 Encounters:  10/29/18 258 lb (117 kg)  08/09/18 253 lb  (114.8 kg)  12/22/17 276 lb 6.4 oz (125.4 kg)      Thyroid function tests as follows:     Lab Results  Component Value Date   FREET4 0.98 11/22/2018   FREET4 0.68 (L) 12/19/2017   FREET4 0.81 10/17/2017   T3FREE 3.2 11/22/2018   T3FREE 3.0 12/19/2017   T3FREE 3.6 10/17/2017   TSH <0.01 (L) 11/22/2018   TSH 0.215 (L) 12/19/2017   TSH 0.02 (L) 10/17/2017    Lab Results  Component Value Date   THYROTRECAB 0.55 10/18/2017     Allergies as of 11/26/2018      Reactions   Demerol Nausea And Vomiting   Severe   Hydromorphone Hcl Itching   All over the body.   Tetracycline Hives      Medication List       Accurate as of November 26, 2018  3:52 PM. If you have any questions, ask your nurse or doctor.  acetaminophen 325 MG tablet Commonly known as: TYLENOL Take 650 mg by mouth every 6 (six) hours as needed (for pain.).   ALPRAZolam 0.5 MG tablet Commonly known as: XANAX Take 0.5 mg by mouth 3 (three) times daily as needed for sleep or anxiety.   Benicar 20 MG tablet Generic drug: olmesartan Take 20 mg by mouth daily.   Bydureon 2 MG Pen Generic drug: Exenatide ER Inject 2 mg into the muscle every Saturday.   calamine lotion Apply 1 application topically as needed (bug bites).   diclofenac sodium 1 % Gel Commonly known as: VOLTAREN Apply 2-4 g topically 4 (four) times daily as needed for pain.   empagliflozin 25 MG Tabs tablet Commonly known as: JARDIANCE Take 25 mg by mouth daily.   ferrous sulfate 325 (65 FE) MG tablet Take 325 mg by mouth daily with breakfast.   furosemide 40 MG tablet Commonly known as: LASIX Take 40 mg by mouth daily as needed for fluid. Fluid   gabapentin 600 MG tablet Commonly known as: NEURONTIN Take 600 mg by mouth at bedtime.   HumaLOG KwikPen 100 UNIT/ML KwikPen Generic drug: insulin lispro Inject 8-14 Units into the skin See admin instructions. Inject 8 units subcutaneously in the morning & inject 14 units  subcutaneously at supper   hydrOXYzine 25 MG tablet Commonly known as: ATARAX/VISTARIL Take 25 mg by mouth 3 (three) times daily as needed (Neuropathic itch).   ibuprofen 200 MG tablet Commonly known as: ADVIL Take 400-600 mg by mouth every 8 (eight) hours as needed for moderate pain. Pain   ICAPS AREDS 2 PO Take 1 capsule by mouth 2 (two) times a day.   Lantus SoloStar 100 UNIT/ML Solostar Pen Generic drug: Insulin Glargine Inject 70 Units into the skin 2 (two) times a day.   loratadine 10 MG tablet Commonly known as: CLARITIN Take 10 mg by mouth daily.   Lubricant Eye Drops 0.5 % Soln Generic drug: carboxymethylcellulose 1 drop 3 (three) times daily as needed.   metFORMIN 500 MG 24 hr tablet Commonly known as: GLUCOPHAGE-XR Take 2,000 mg by mouth daily.   montelukast 10 MG tablet Commonly known as: SINGULAIR TAKE 1 TABLET AT BEDTIME   omeprazole 40 MG capsule Commonly known as: PRILOSEC TAKE 1 CAPSULE EVERY MORNING What changed: when to take this   ProAir HFA 108 (90 Base) MCG/ACT inhaler Generic drug: albuterol INHALE 2 PUFFS INTO THE LUNGS EVERY 6 HOURS AS NEEDED FOR WHEEZING OR SHORTNESS OF BREATH What changed: See the new instructions.   albuterol (2.5 MG/3ML) 0.083% nebulizer solution Commonly known as: PROVENTIL Take 3 mLs (2.5 mg total) by nebulization every 6 (six) hours as needed for wheezing or shortness of breath (dx: J43.9). What changed: Another medication with the same name was changed. Make sure you understand how and when to take each.   REMICADE IV Inject 1 Dose into the vein every 6 (six) weeks.   sertraline 50 MG tablet Commonly known as: ZOLOFT Take 50 mg by mouth daily.   simvastatin 40 MG tablet Commonly known as: ZOCOR Take 40 mg by mouth daily with supper.   Trelegy Ellipta 100-62.5-25 MCG/INH Aepb Generic drug: Fluticasone-Umeclidin-Vilant USE 1 INHALATION DAILY What changed: See the new instructions.           Past Medical  History:  Diagnosis Date  . Asthma   . COPD (chronic obstructive pulmonary disease) ([place])   . Depression   . DM type 1 (diabetes mellitus, type 1) ([place])   .  GERD (gastroesophageal reflux disease)   . Hyperlipidemia   . Hypertension   . Morbid obesity ([place])   . Obstructive sleep apnea syndrome   . Osteoarthritis of right knee 04/2010   end-stage  . PONV (postoperative nausea and vomiting)   . Rheumatoid arthritis(714.0)    on methotrexate therapy  . Septic arthritis of knee, right ([place]) 04/2010    Past Surgical History:  Procedure Laterality Date  . ABDOMINAL HYSTERECTOMY  1992  . ABDOMINAL WALL MESH  REMOVAL  08/13/2009   with removal of retained stitches  . [place]   for cardiac tamponade  . APPENDECTOMY  1967  . BIOPSY  10/29/2018   Procedure: BIOPSY;  Surgeon: Doran Stabler, MD;  Location: [facility];  Service: Gastroenterology;;  . BONE BIOPSY Right 04/19/2016   Procedure: BONE BIOPSY RIGHT GREAT TOE;  Surgeon: Caprice Beaver, DPM;  Location: [facility];  Service: Podiatry;  Laterality: Right;  . CHOLECYSTECTOMY  1992   laparoscopic  . COLONOSCOPY  12/08/2011   Procedure: COLONOSCOPY;  Surgeon: Rogene Houston, MD;  Location: [facility];  Service: Endoscopy;  Laterality: N/A;  830  . COLONOSCOPY WITH PROPOFOL N/A 10/16/2017   Procedure: COLONOSCOPY WITH PROPOFOL;  Surgeon: Doran Stabler, MD;  Location: [facility];  Service: Gastroenterology;  Laterality: N/A;  . COLONOSCOPY WITH PROPOFOL N/A 10/29/2018   Procedure: COLONOSCOPY WITH PROPOFOL;  Surgeon: Doran Stabler, MD;  Location: [facility];  Service: Gastroenterology;  Laterality: N/A;  . ESOPHAGOGASTRODUODENOSCOPY (EGD) WITH PROPOFOL N/A 10/29/2018   Procedure: ESOPHAGOGASTRODUODENOSCOPY (EGD) WITH PROPOFOL;  Surgeon: Doran Stabler, MD;  Location: [facility];  Service: Gastroenterology;  Laterality: N/A;  . FOREIGN BODY REMOVAL ABDOMINAL  01/04/2010   stitch abscesses  .  HERNIA REPAIR  2001   open LOA / [place] repair w mesh.  Dr. Arnoldo Morale  . HERNIA REPAIR  2007   open redo LOA / [place] repair w mesh.  Dr. Arnoldo Morale  . POLYPECTOMY  10/16/2017   Procedure: POLYPECTOMY;  Surgeon: Doran Stabler, MD;  Location: [facility];  Service: Gastroenterology;;  . POLYPECTOMY  10/29/2018   Procedure: POLYPECTOMY;  Surgeon: Doran Stabler, MD;  Location: [facility];  Service: Gastroenterology;;  . REVISION TOTAL KNEE ARTHROPLASTY  06/09/2010   I and D   . TOTAL KNEE ARTHROPLASTY  05/07/2010   right knee    Family History  Problem Relation Age of Onset  . Cancer Mother   . Hyperlipidemia Mother   . Diabetes Mother   . Thyroid disease Mother   . Diabetes Father   . COPD Father   . Arthritis Father        RA  . Hyperlipidemia Brother   . Hypertension Brother     Social History:  reports that she quit smoking about 26 years ago. Her smoking use included cigarettes. She has a 50.00 pack-year smoking history. She has never used smokeless tobacco. She reports that she does not drink alcohol or use drugs.  Allergies:  Allergies  Allergen Reactions  . Demerol Nausea And Vomiting    Severe  . Hydromorphone Hcl Itching    All over the body.  . Tetracycline Hives     Review of Systems  She has been on insulin for her diabetes since 1992 Also on Jardiance, Bydureon and metformin  She is continuing to be followed by her PCP. She says that she has tried to improve her diet and has  been able to lose 20 pounds in the last few months  Most recent A1c was 5.5 done in April 2020 but she does have issues with anemia   Lab Results  Component Value Date   HGBA1C 6.6 (A) 12/22/2017   HGBA1C 7.8 (H) 09/16/2016   HGBA1C (H) 06/10/2010    7.0 (NOTE)                                                                       According to the ADA Clinical Practice Recommendations for 2011, when HbA1c is used as a screening test:   >=6.5%   Diagnostic of Diabetes Mellitus            (if abnormal result  is confirmed)  5.7-6.4%   Increased risk of developing Diabetes Mellitus  References:Diagnosis and Classification of Diabetes Mellitus,Diabetes OMBT,5974,16(LAGTX 1):S62-S69 and Standards of Medical Care in         Diabetes - 2011,Diabetes Care,2011,34  (Suppl 1):S11-S61.   Lab Results  Component Value Date   CREATININE 0.92 07/30/2017     HYPERTENSION: She is taking Benicar 20 mg daily She will check her blood pressure at home.   She is also on Jardiance    Examination:   There were no vitals taken for this visit.     Assessment/Plan:  Subclinical Hyperthyroidism  She has had a persistently suppressed TSH without overt hyperthyroidism Initially evaluated in 2019 No evidence of toxic nodular goiter, nodules or Graves' disease on evaluation  Currently she does not have any symptoms, no history of goiter Although her TSH is undetectable her free T4 and T3 are quite normal  Since she does not have any significant problems with hypertension, cardiac arrhythmias do not see any need to treat her with antithyroid drugs Previously PTU had brought her free T4 down below normal  However will need periodic follow-up to make sure she is not having any progression We will also recheck her thyrotropin antibody on the next visit, this was previously negative  Hypertension and diabetes: She is following up with her PCP    Elayne Snare 11/26/2018, 3:52 PM       Note: This office note was prepared with Dragon voice recognition system technology. Any transcriptional errors that result from this process are unintentional.

## 2018-12-24 DIAGNOSIS — M0589 Other rheumatoid arthritis with rheumatoid factor of multiple sites: Secondary | ICD-10-CM | POA: Diagnosis not present

## 2018-12-25 ENCOUNTER — Other Ambulatory Visit (HOSPITAL_COMMUNITY): Payer: Self-pay | Admitting: Internal Medicine

## 2018-12-25 DIAGNOSIS — Z1231 Encounter for screening mammogram for malignant neoplasm of breast: Secondary | ICD-10-CM

## 2019-01-04 DIAGNOSIS — Z23 Encounter for immunization: Secondary | ICD-10-CM | POA: Diagnosis not present

## 2019-01-25 ENCOUNTER — Ambulatory Visit (HOSPITAL_COMMUNITY)
Admission: RE | Admit: 2019-01-25 | Discharge: 2019-01-25 | Disposition: A | Discharge status: Home / Self Care | Payer: Medicare Other | Source: Ambulatory Visit | Attending: Internal Medicine | Admitting: Internal Medicine

## 2019-01-25 ENCOUNTER — Other Ambulatory Visit: Payer: Self-pay

## 2019-01-25 DIAGNOSIS — Z1231 Encounter for screening mammogram for malignant neoplasm of breast: Secondary | ICD-10-CM | POA: Insufficient documentation

## 2019-02-04 DIAGNOSIS — M0589 Other rheumatoid arthritis with rheumatoid factor of multiple sites: Secondary | ICD-10-CM | POA: Diagnosis not present

## 2019-02-04 MED ORDER — ALBUTEROL SULFATE HFA 108 (90 BASE) MCG/ACT IN AERS: INHALATION_SPRAY | RESPIRATORY_TRACT | 0 refills | Status: DC

## 2019-02-04 NOTE — Telephone Encounter (Signed)
Patient sent in mychart message for refill of Proair inhaler. Rx sent to preferred pharmacy. Nothing further needed at this time.

## 2019-02-05 DIAGNOSIS — M069 Rheumatoid arthritis, unspecified: Secondary | ICD-10-CM | POA: Diagnosis not present

## 2019-02-05 DIAGNOSIS — Z79899 Other long term (current) drug therapy: Secondary | ICD-10-CM | POA: Diagnosis not present

## 2019-02-05 DIAGNOSIS — E1140 Type 2 diabetes mellitus with diabetic neuropathy, unspecified: Secondary | ICD-10-CM | POA: Diagnosis not present

## 2019-02-05 DIAGNOSIS — D508 Other iron deficiency anemias: Secondary | ICD-10-CM | POA: Diagnosis not present

## 2019-02-06 DIAGNOSIS — H353130 Nonexudative age-related macular degeneration, bilateral, stage unspecified: Secondary | ICD-10-CM | POA: Diagnosis not present

## 2019-02-11 DIAGNOSIS — E02 Subclinical iodine-deficiency hypothyroidism: Secondary | ICD-10-CM | POA: Diagnosis not present

## 2019-02-11 DIAGNOSIS — D509 Iron deficiency anemia, unspecified: Secondary | ICD-10-CM | POA: Diagnosis not present

## 2019-02-11 DIAGNOSIS — E1140 Type 2 diabetes mellitus with diabetic neuropathy, unspecified: Secondary | ICD-10-CM | POA: Diagnosis not present

## 2019-02-11 DIAGNOSIS — J449 Chronic obstructive pulmonary disease, unspecified: Secondary | ICD-10-CM | POA: Diagnosis not present

## 2019-02-12 ENCOUNTER — Other Ambulatory Visit: Payer: Self-pay | Admitting: Endocrinology

## 2019-02-12 DIAGNOSIS — E0590 Thyrotoxicosis, unspecified without thyrotoxic crisis or storm: Secondary | ICD-10-CM | POA: Diagnosis not present

## 2019-02-12 DIAGNOSIS — E119 Type 2 diabetes mellitus without complications: Secondary | ICD-10-CM | POA: Diagnosis not present

## 2019-02-12 DIAGNOSIS — Z794 Long term (current) use of insulin: Secondary | ICD-10-CM | POA: Diagnosis not present

## 2019-02-13 LAB — FREE THYROXINE + T4
Free T4: 1.24 ng/dL (ref 0.82–1.77)
T4, Total: 7.2 ug/dL (ref 4.5–12.0)

## 2019-02-13 LAB — TSH: TSH: 0.01 u[IU]/mL — ABNORMAL LOW (ref 0.450–4.500)

## 2019-02-13 LAB — THYROTROPIN RECEPTOR AUTOABS: Thyrotropin Receptor Ab: <1.1 IU/L (ref 0.00–1.75)

## 2019-02-13 LAB — GLUCOSE, RANDOM: Glucose: 113 mg/dL — ABNORMAL HIGH (ref 65–99)

## 2019-02-13 LAB — T3, FREE: T3, Free: 4 pg/mL (ref 2.0–4.4)

## 2019-02-18 DIAGNOSIS — M0589 Other rheumatoid arthritis with rheumatoid factor of multiple sites: Secondary | ICD-10-CM | POA: Diagnosis not present

## 2019-02-18 DIAGNOSIS — L299 Pruritus, unspecified: Secondary | ICD-10-CM | POA: Diagnosis not present

## 2019-02-18 DIAGNOSIS — Z79899 Other long term (current) drug therapy: Secondary | ICD-10-CM | POA: Diagnosis not present

## 2019-02-18 DIAGNOSIS — Z6841 Body Mass Index (BMI) 40.0 and over, adult: Secondary | ICD-10-CM | POA: Diagnosis not present

## 2019-02-18 DIAGNOSIS — M545 Low back pain: Secondary | ICD-10-CM | POA: Diagnosis not present

## 2019-02-18 DIAGNOSIS — M150 Primary generalized (osteo)arthritis: Secondary | ICD-10-CM | POA: Diagnosis not present

## 2019-02-25 ENCOUNTER — Other Ambulatory Visit: Payer: Self-pay | Admitting: Pulmonary Disease

## 2019-02-26 ENCOUNTER — Other Ambulatory Visit: Payer: Self-pay

## 2019-02-26 ENCOUNTER — Ambulatory Visit (INDEPENDENT_AMBULATORY_CARE_PROVIDER_SITE_OTHER): Payer: Medicare Other | Admitting: Pulmonary Disease

## 2019-02-26 ENCOUNTER — Encounter: Payer: Self-pay | Admitting: Pulmonary Disease

## 2019-02-26 VITALS — BP 130/68 | HR 96 | Temp 98.0°F | Ht 62.21 in | Wt 261.0 lb

## 2019-02-26 DIAGNOSIS — G4733 Obstructive sleep apnea (adult) (pediatric): Secondary | ICD-10-CM

## 2019-02-26 DIAGNOSIS — J449 Chronic obstructive pulmonary disease, unspecified: Secondary | ICD-10-CM | POA: Diagnosis not present

## 2019-02-26 MED ORDER — ALBUTEROL SULFATE HFA 108 (90 BASE) MCG/ACT IN AERS: INHALATION_SPRAY | RESPIRATORY_TRACT | 3 refills | Status: DC

## 2019-02-26 NOTE — Progress Notes (Signed)
[facility]  Chief Complaint  Patient presents with  . Follow-up    1 year ROV    Constitutional:  BP 130/68 (BP Location: Right Arm, Cuff Size: Normal)   Pulse 96   Temp 98 F (36.7 C) (Temporal)   Ht 5' 2.21" (1.58 m) Comment: with shoes  Wt 261 lb (118.4 kg)   SpO2 94%   BMI 47.42 kg/m   Past Medical History:  RA, HTN, HLD, GERD, DM, Depression  Brief Summary:  [name] is a 68 y.o. female former smoker with obstructive sleep apnea and COPD.  Has more of a cough for past few days.  Feels congested but not bringing up sputum.  Not having wheeze, fever, hemoptysis, or chest pain.  Using CPAP nightly.  No issue with mask fit.  Her filter cover fell off.  She should be eligible for new machine in 2021.  Had to start on MTX with remicade for RA.  Physical Exam:   Appearance - well kempt   ENMT - clear nasal mucosa, midline nasal  septum, no oral exudates, no LAN, trachea midline  Respiratory - normal chest wall, normal respiratory effort, no accessory muscle use, no wheeze/rales  CV - s1s2 regular rate and rhythm, no murmurs, no peripheral edema, radial pulses symmetric  GI - soft, non tender, no masses  Lymph - no adenopathy noted in neck and axillary areas  MSK - normal gait  Ext - no cyanosis, clubbing, or joint inflammation noted  Skin - no rashes, lesions, or ulcers  Neuro - normal strength, oriented x 3  Psych - normal mood and affect   Assessment/Plan:   COPD with chronic bronchitis. - continue trelegy and prn proair - can use mucinex prn for cough  Obstructive sleep apnea. - she is compliant with CPAP and reports benefit - continue CPAP 9 cm H2O  Obesity. -- discussed options to assist with weight loss  Rheumatoid arthritis. - followed by Dr. Amil Amen with rheumatology - recently started on MTX, and continuing on remicade   Patient Instructions  Can try using mucinex to help clear up chest  congestion and cough  Follow up in 6 months    Wendy Mires, MD [place] Pager: [phone] 02/26/2019, 1:57 PM  Flow Sheet     Pulmonary tests:  PFT 09/18/07 >> FEV1 1.38 (61%), FEV1% 59, TLC 4.48 (95%), DLCO 80%, + BD PFT 08/09/17 >> FEV1 1.35 (61%), FEV1% 69, TLC 4.67 (98%), DLCO 97%  Chest imaging:  HRCT chest 08/17/17 >> calcification RV, atherosclerosis, scarring, mild air trapping, micronodularity LLL, 1.4 cm nodule LUL, fatty liver CT chest 11/29/17 >> LUL nodule resolved  Sleep tests:  PSG 09/02/07 >> AHI 9 CPAP 01/26/19 to 02/24/19 >> used on 30 of 30 nights with average 8 hrs 35 min.  Average AHI 1.6 with CPAP 9 cm H2O  Cardiac tests:  Echo 09/01/17 >> EF 65 to 70%, mild AS, severe MV calcification  Medications:   Allergies as of 02/26/2019      Reactions   Demerol Nausea And Vomiting   Severe   Hydromorphone Hcl Itching   All over the body.   Tetracycline Hives      Medication List       Accurate as of February 26, 2019  1:57 PM. If you have any questions, ask your nurse or doctor.        STOP taking these medications   ferrous sulfate 325 (65 FE) MG  tablet Stopped by: Wendy Mires, MD   ICAPS AREDS 2 PO Stopped by: Wendy Mires, MD     TAKE these medications   acetaminophen 325 MG tablet Commonly known as: TYLENOL Take 650 mg by mouth every 6 (six) hours as needed (for pain.).   albuterol (2.5 MG/3ML) 0.083% nebulizer solution Commonly known as: PROVENTIL Take 3 mLs (2.5 mg total) by nebulization every 6 (six) hours as needed for wheezing or shortness of breath (dx: J43.9).   albuterol 108 (90 Base) MCG/ACT inhaler Commonly known as: ProAir HFA INHALE 2 PUFFS INTO THE LUNGS EVERY 6 HOURS AS NEEDED FOR WHEEZING OR SHORTNESS OF BREATH   ALPRAZolam 0.5 MG tablet Commonly known as: XANAX Take 0.5 mg by mouth 3 (three) times daily as needed for sleep or anxiety.   Benicar 20 MG tablet Generic drug: olmesartan Take 20  mg by mouth daily.   Bydureon 2 MG Pen Generic drug: Exenatide ER Inject 2 mg into the muscle every Saturday.   calamine lotion Apply 1 application topically as needed (bug bites).   diclofenac sodium 1 % Gel Commonly known as: VOLTAREN Apply 2-4 g topically 4 (four) times daily as needed for pain.   empagliflozin 25 MG Tabs tablet Commonly known as: JARDIANCE Take 25 mg by mouth daily.   furosemide 40 MG tablet Commonly known as: LASIX Take 40 mg by mouth daily as needed for fluid. Fluid   gabapentin 600 MG tablet Commonly known as: NEURONTIN Take 600 mg by mouth at bedtime.   HumaLOG KwikPen 100 UNIT/ML KwikPen Generic drug: insulin lispro Inject 8-14 Units into the skin See admin instructions. Inject 8 units subcutaneously in the morning & inject 14 units subcutaneously at supper   hydrOXYzine 25 MG tablet Commonly known as: ATARAX/VISTARIL Take 25 mg by mouth 3 (three) times daily as needed (Neuropathic itch).   ibuprofen 200 MG tablet Commonly known as: ADVIL Take 400-600 mg by mouth every 8 (eight) hours as needed for moderate pain. Pain   Lantus SoloStar 100 UNIT/ML Solostar Pen Generic drug: Insulin Glargine Inject 70 Units into the skin 2 (two) times a day.   loratadine 10 MG tablet Commonly known as: CLARITIN Take 10 mg by mouth daily.   Lubricant Eye Drops 0.5 % Soln Generic drug: carboxymethylcellulose 1 drop 3 (three) times daily as needed.   metFORMIN 500 MG 24 hr tablet Commonly known as: GLUCOPHAGE-XR Take 2,000 mg by mouth daily.   methotrexate 2.5 MG tablet Commonly known as: RHEUMATREX Take 2.5 mg by mouth once a week. Caution:Chemotherapy. Protect from light.   montelukast 10 MG tablet Commonly known as: SINGULAIR TAKE 1 TABLET AT BEDTIME   omeprazole 40 MG capsule Commonly known as: PRILOSEC TAKE 1 CAPSULE EVERY MORNING What changed: when to take this   REMICADE IV Inject 1 Dose into the vein every 6 (six) weeks.   sertraline  50 MG tablet Commonly known as: ZOLOFT Take 50 mg by mouth daily.   simvastatin 40 MG tablet Commonly known as: ZOCOR Take 40 mg by mouth daily with supper.   Trelegy Ellipta 100-62.5-25 MCG/INH Aepb Generic drug: Fluticasone-Umeclidin-Vilant USE 1 INHALATION DAILY What changed: See the new instructions.       Past Surgical History:  She  has a past surgical history that includes Total knee arthroplasty (05/07/2010); Revision total knee arthroplasty (06/09/2010); Appendectomy (1967); Abdominal hysterectomy (1992); Cholecystectomy (1992); [place]-pulmonary window repair (1992); Hernia repair (2001); Hernia repair (2007); Abdominal wall mesh  removal (08/13/2009); Foreign body removal abdominal (  01/04/2010); Colonoscopy (12/08/2011); Bone biopsy (Right, 04/19/2016); Colonoscopy with propofol (N/A, 10/16/2017); polypectomy (10/16/2017); Esophagogastroduodenoscopy (egd) with propofol (N/A, 10/29/2018); Colonoscopy with propofol (N/A, 10/29/2018); polypectomy (10/29/2018); and biopsy (10/29/2018).  Family History:  Her family history includes Arthritis in her father; COPD in her father; Cancer in her mother; Diabetes in her father and mother; Hyperlipidemia in her brother and mother; Hypertension in her brother; Thyroid disease in her mother.  Social History:  She  reports that she quit smoking about 26 years ago. Her smoking use included cigarettes. She has a 50.00 pack-year smoking history. She has never used smokeless tobacco. She reports that she does not drink alcohol or use drugs.

## 2019-02-26 NOTE — Patient Instructions (Signed)
Can try using mucinex to help clear up chest congestion and cough  Follow up in 6 months

## 2019-02-27 ENCOUNTER — Ambulatory Visit (INDEPENDENT_AMBULATORY_CARE_PROVIDER_SITE_OTHER): Payer: Medicare Other | Admitting: Endocrinology

## 2019-02-27 ENCOUNTER — Encounter: Payer: Self-pay | Admitting: Endocrinology

## 2019-02-27 VITALS — Ht 62.21 in

## 2019-02-27 DIAGNOSIS — Z794 Long term (current) use of insulin: Secondary | ICD-10-CM | POA: Diagnosis not present

## 2019-02-27 DIAGNOSIS — E0590 Thyrotoxicosis, unspecified without thyrotoxic crisis or storm: Secondary | ICD-10-CM

## 2019-02-27 DIAGNOSIS — E119 Type 2 diabetes mellitus without complications: Secondary | ICD-10-CM

## 2019-02-27 MED ORDER — PROPYLTHIOURACIL 50 MG PO TABS: 50.0000 mg | ORAL_TABLET | Freq: Every day | ORAL | 2 refills | Status: DC

## 2019-02-27 NOTE — Progress Notes (Signed)
Patient ID: [name], female   DOB: [birth year], 68 y.o.   MRN: [id number]                                                                                                              Reason for Appointment: Abnormal thyroid  Referring physician: Eleonore Chiquito  Today's office visit was provided via telemedicine using video technique The patient was explained the limitations of evaluation and management by telemedicine and the availability of in person appointments.  The patient understood the limitations and agreed to proceed. Patient also understood that the telehealth visit is billable. . Location of the patient: Patient's home . Location of the provider: Physician office Only the patient and myself were participating in the encounter     Chief complaint: Follow-up of thyroid   History of Present Illness:   She had a low level of TSH done during her hospitalization for COPD in April 2019 Also at that time she was tachycardic, was apparently fairly ill at that time with her COPD exacerbation  Evaluation did not show any evidence of toxic nodular goiter, hot nodule or Graves' disease With a trial of PTU 50 mg twice daily she subjectively did not feel any different and follow-up free T4 was below normal at 0.68 Also then her TSH was not significantly suppressed  She was told to stop PTU  Since TSH was still relatively low in April 2020 she has been seen periodically in follow-up, last visit 8/20  No recent symptoms suggestive of hyperthyroidism such as unusual weight loss, palpitations, unusual fatigue or heat intolerance She has a little shakiness of her head which is new but no tremor of her hand  Previously did not have a goiter on exam  Her labs now show again significantly TSH but her free T4 is upper normal along with rising free T4 compared to August   Her thyroid scan in 10/2017 did not show any overactive areas and uptake of 19%  Wt Readings from Last 3 Encounters:   02/26/19 261 lb (118.4 kg)  10/29/18 258 lb (117 kg)  08/09/18 253 lb (114.8 kg)      Thyroid function tests as follows:     Lab Results  Component Value Date   FREET4 1.24 02/12/2019   FREET4 0.98 11/22/2018   FREET4 0.68 (L) 12/19/2017   T3FREE 4.0 02/12/2019   T3FREE 3.2 11/22/2018   T3FREE 3.0 12/19/2017   TSH 0.010 (L) 02/12/2019   TSH <0.01 (L) 11/22/2018   TSH 0.215 (L) 12/19/2017    Lab Results  Component Value Date   THYROTRECAB <1.10 02/12/2019   THYROTRECAB 0.55 10/18/2017     Allergies as of 02/27/2019      Reactions   Demerol Nausea And Vomiting   Severe   Hydromorphone Hcl Itching   All over the body.   Tetracycline Hives      Medication List       Accurate as of February 27, 2019  2:06 PM. If you have any questions, ask your  nurse or doctor.        acetaminophen 325 MG tablet Commonly known as: TYLENOL Take 650 mg by mouth every 6 (six) hours as needed (for pain.).   albuterol (2.5 MG/3ML) 0.083% nebulizer solution Commonly known as: PROVENTIL Take 3 mLs (2.5 mg total) by nebulization every 6 (six) hours as needed for wheezing or shortness of breath (dx: J43.9).   albuterol 108 (90 Base) MCG/ACT inhaler Commonly known as: ProAir HFA INHALE 2 PUFFS INTO THE LUNGS EVERY 6 HOURS AS NEEDED FOR WHEEZING OR SHORTNESS OF BREATH   ALPRAZolam 0.5 MG tablet Commonly known as: XANAX Take 0.5 mg by mouth 3 (three) times daily as needed for sleep or anxiety.   Benicar 20 MG tablet Generic drug: olmesartan Take 20 mg by mouth daily.   Bydureon 2 MG Pen Generic drug: Exenatide ER Inject 2 mg into the muscle every Saturday.   calamine lotion Apply 1 application topically as needed (bug bites).   diclofenac sodium 1 % Gel Commonly known as: VOLTAREN Apply 2-4 g topically 4 (four) times daily as needed for pain.   empagliflozin 25 MG Tabs tablet Commonly known as: JARDIANCE Take 25 mg by mouth daily.   furosemide 40 MG tablet Commonly  known as: LASIX Take 40 mg by mouth daily as needed for fluid. Fluid   gabapentin 600 MG tablet Commonly known as: NEURONTIN Take 600 mg by mouth at bedtime.   HumaLOG KwikPen 100 UNIT/ML KwikPen Generic drug: insulin lispro Inject 8-14 Units into the skin See admin instructions. Inject 8 units subcutaneously in the morning & inject 14 units subcutaneously at supper   hydrOXYzine 25 MG tablet Commonly known as: ATARAX/VISTARIL Take 25 mg by mouth 3 (three) times daily as needed (Neuropathic itch).   ibuprofen 200 MG tablet Commonly known as: ADVIL Take 400-600 mg by mouth every 8 (eight) hours as needed for moderate pain. Pain   Lantus SoloStar 100 UNIT/ML Solostar Pen Generic drug: Insulin Glargine Inject 70 Units into the skin 2 (two) times a day.   loratadine 10 MG tablet Commonly known as: CLARITIN Take 10 mg by mouth daily.   Lubricant Eye Drops 0.5 % Soln Generic drug: carboxymethylcellulose 1 drop 3 (three) times daily as needed.   metFORMIN 500 MG 24 hr tablet Commonly known as: GLUCOPHAGE-XR Take 2,000 mg by mouth daily.   methotrexate 2.5 MG tablet Commonly known as: RHEUMATREX Take 2.5 mg by mouth once a week. Caution:Chemotherapy. Protect from light.   montelukast 10 MG tablet Commonly known as: SINGULAIR TAKE 1 TABLET AT BEDTIME   omeprazole 40 MG capsule Commonly known as: PRILOSEC TAKE 1 CAPSULE EVERY MORNING What changed: when to take this   REMICADE IV Inject 1 Dose into the vein every 6 (six) weeks.   sertraline 50 MG tablet Commonly known as: ZOLOFT Take 50 mg by mouth daily.   simvastatin 40 MG tablet Commonly known as: ZOCOR Take 40 mg by mouth daily with supper.   Trelegy Ellipta 100-62.5-25 MCG/INH Aepb Generic drug: Fluticasone-Umeclidin-Vilant USE 1 INHALATION DAILY What changed: See the new instructions.           Past Medical History:  Diagnosis Date  . Asthma   . COPD (chronic obstructive pulmonary disease) ([place])   .  Depression   . DM type 1 (diabetes mellitus, type 1) ([place])   . GERD (gastroesophageal reflux disease)   . Hyperlipidemia   . Hypertension   . Morbid obesity ([place])   . Obstructive sleep apnea syndrome   .  Osteoarthritis of right knee 04/2010   end-stage  . PONV (postoperative nausea and vomiting)   . Rheumatoid arthritis(714.0)    on methotrexate therapy  . Septic arthritis of knee, right ([place]) 04/2010    Past Surgical History:  Procedure Laterality Date  . ABDOMINAL HYSTERECTOMY  1992  . ABDOMINAL WALL MESH  REMOVAL  08/13/2009   with removal of retained stitches  . [place]   for cardiac tamponade  . APPENDECTOMY  1967  . BIOPSY  10/29/2018   Procedure: BIOPSY;  Surgeon: Doran Stabler, MD;  Location: [facility];  Service: Gastroenterology;;  . BONE BIOPSY Right 04/19/2016   Procedure: BONE BIOPSY RIGHT GREAT TOE;  Surgeon: Caprice Beaver, DPM;  Location: [facility];  Service: Podiatry;  Laterality: Right;  . CHOLECYSTECTOMY  1992   laparoscopic  . COLONOSCOPY  12/08/2011   Procedure: COLONOSCOPY;  Surgeon: Rogene Houston, MD;  [facility];  Service: Endoscopy;  Laterality: N/A;  830  . COLONOSCOPY WITH PROPOFOL N/A 10/16/2017   Procedure: COLONOSCOPY WITH PROPOFOL;  Surgeon: Doran Stabler, MD;  Location: [facility];  Service: Gastroenterology;  Laterality: N/A;  . COLONOSCOPY WITH PROPOFOL N/A 10/29/2018   Procedure: COLONOSCOPY WITH PROPOFOL;  Surgeon: Doran Stabler, MD;  Location: [facility];  Service: Gastroenterology;  Laterality: N/A;  . ESOPHAGOGASTRODUODENOSCOPY (EGD) WITH PROPOFOL N/A 10/29/2018   Procedure: ESOPHAGOGASTRODUODENOSCOPY (EGD) WITH PROPOFOL;  Surgeon: Doran Stabler, MD;  [facility];  Service: Gastroenterology;  Laterality: N/A;  . FOREIGN BODY REMOVAL ABDOMINAL  01/04/2010   stitch abscesses  . HERNIA REPAIR  2001   open LOA / [place] repair w mesh.  Dr. Arnoldo Morale  . HERNIA REPAIR  2007   open  redo LOA / [place] repair w mesh.  Dr. Arnoldo Morale  . POLYPECTOMY  10/16/2017   Procedure: POLYPECTOMY;  Surgeon: Doran Stabler, MD;  Location: [facility];  Service: Gastroenterology;;  . POLYPECTOMY  10/29/2018   Procedure: POLYPECTOMY;  Surgeon: Doran Stabler, MD;  Location: [facility];  Service: Gastroenterology;;  . REVISION TOTAL KNEE ARTHROPLASTY  06/09/2010   I and D   . TOTAL KNEE ARTHROPLASTY  05/07/2010   right knee    Family History  Problem Relation Age of Onset  . Cancer Mother   . Hyperlipidemia Mother   . Diabetes Mother   . Thyroid disease Mother   . Diabetes Father   . COPD Father   . Arthritis Father        RA  . Hyperlipidemia Brother   . Hypertension Brother     Social History:  reports that she quit smoking about 26 years ago. Her smoking use included cigarettes. She has a 50.00 pack-year smoking history. She has never used smokeless tobacco. She reports that she does not drink alcohol or use drugs.  Allergies:  Allergies  Allergen Reactions  . Demerol Nausea And Vomiting    Severe  . Hydromorphone Hcl Itching    All over the body.  . Tetracycline Hives     Review of Systems  She has been on insulin for her diabetes since 1992 Also on Jardiance, Bydureon and metformin  She is continuing to be followed by her PCP. She will still occasionally have readings over 200 after meals  Most recent A1c was again 5.5 done in 01/2019 but she does have issues with anemia   Lab Results  Component Value Date   HGBA1C 6.6 (A)  12/22/2017   HGBA1C 7.8 (H) 09/16/2016   HGBA1C (H) 06/10/2010    7.0 (NOTE)                                                                       According to the ADA Clinical Practice Recommendations for 2011, when HbA1c is used as a screening test:   >=6.5%   Diagnostic of Diabetes Mellitus           (if abnormal result  is confirmed)  5.7-6.4%   Increased risk of developing Diabetes Mellitus  References:Diagnosis and  Classification of Diabetes Mellitus,Diabetes [id number] 1):S62-S69 and Standards of Medical Care in         Diabetes - 2011,Diabetes Care,2011,34  (Suppl 1):S11-S61.   Lab Results  Component Value Date   CREATININE 0.92 07/30/2017     HYPERTENSION: She is taking Benicar 20 mg daily   She is also on Jardiance    Examination:   Ht 5' 2.21" (1.58 m)   BMI 47.42 kg/m      Assessment/Plan:  Subclinical Hyperthyroidism  She has had a persistently suppressed TSH without overt hyperthyroidism Initially evaluated in 2019 No evidence of toxic nodular goiter, nodules or Graves' disease on previous studies  Again she does not have any symptoms like palpitations or unusual weight loss Also no history of goiter and previous thyroid scan was normal  Although her TSH is still very low her free T4 and T3 are increasing and T3 is upper normal As before thyrotropin receptor antibody is normal  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 to explain her abnormal levels Considering her age and history of hypertension discussed that it may be worth trying PTU again since this would help potentially prevent any cardiac arrhythmias in the future  Because of her sensitivity to the medication in the past will only try 50 mg daily and have her come back in 6 weeks for follow-up  Diabetes: Discussed that she should check readings after meals for her diabetes and discussed with her PCP Her A1c is likely falsely low at 5.5 because of anemia    Elayne Snare 02/27/2019, 2:06 PM       Note: This office note was prepared with Dragon voice recognition system technology. Any transcriptional errors that result from this process are unintentional.

## 2019-03-10 IMAGING — MG DIGITAL SCREENING BILATERAL MAMMOGRAM WITH TOMO AND CAD
8 series · 8 of 24 positions shown · non-contrast
Comparison: Previous exam(s).

CLINICAL DATA: Screening.

EXAM:
DIGITAL SCREENING BILATERAL MAMMOGRAM WITH TOMO AND CAD

[R MLO synth-2D]
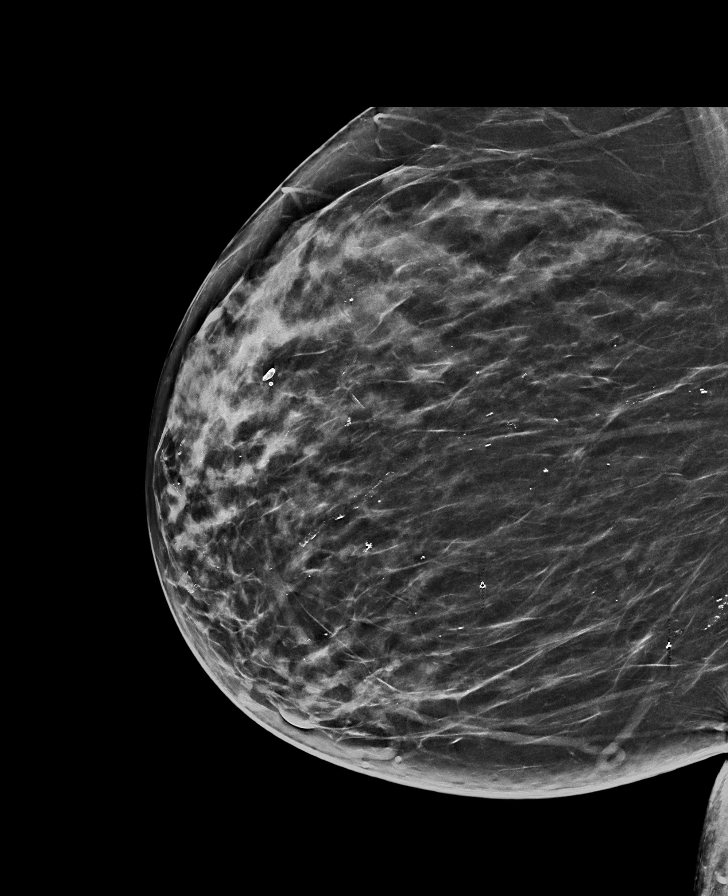

[R CC synth-2D]
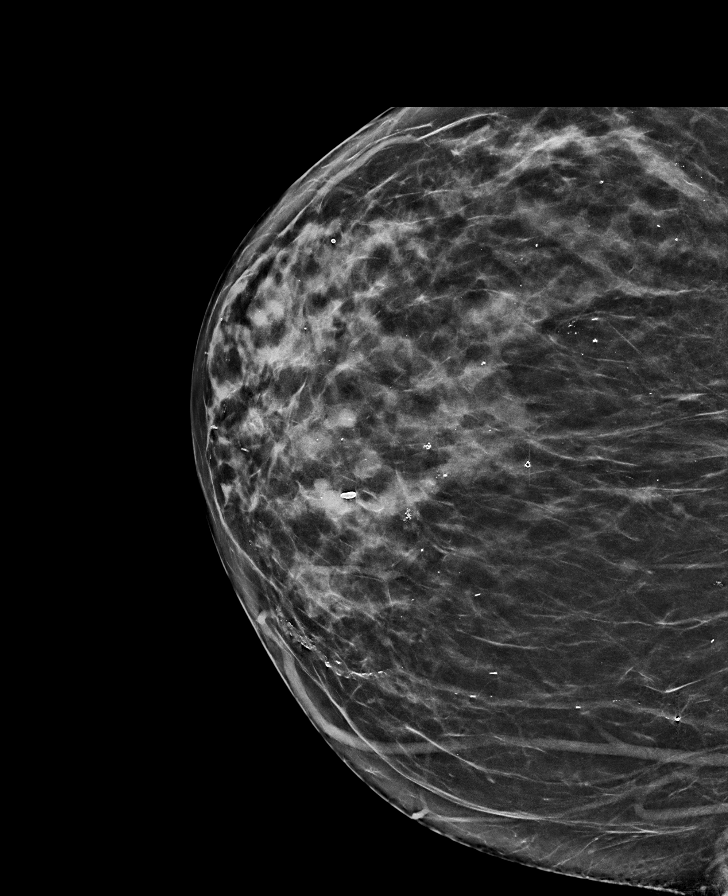

[L CC synth-2D]
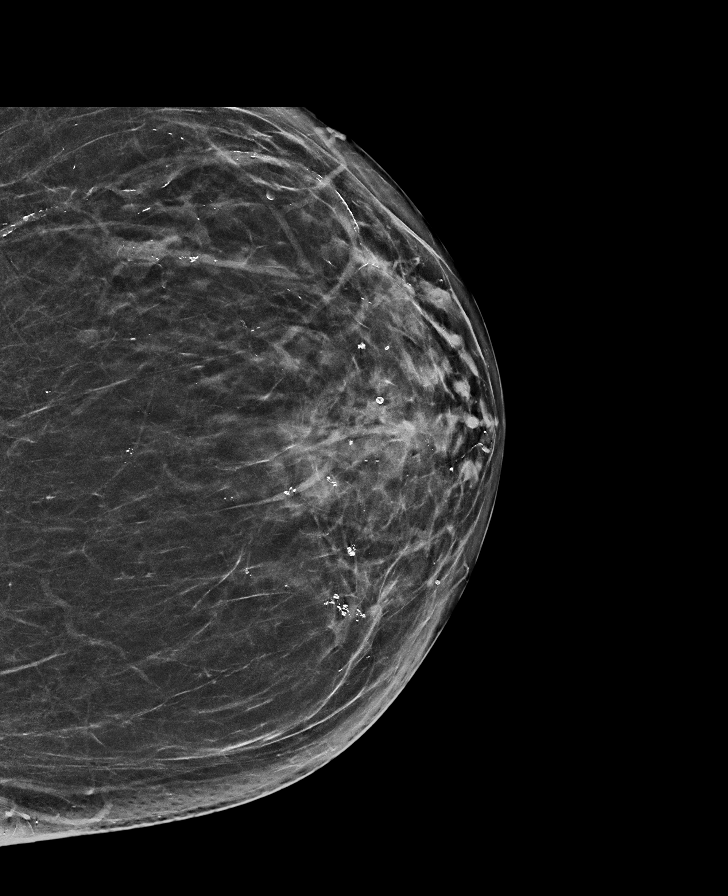

[L MLO synth-2D]
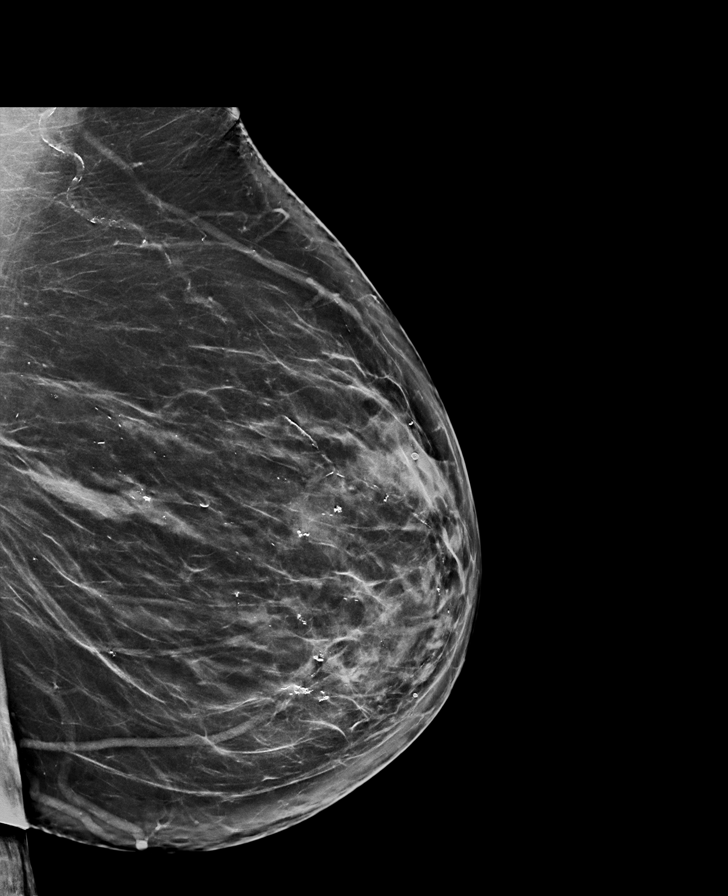

[R MLO tomo · tomo slice 39/78.0]
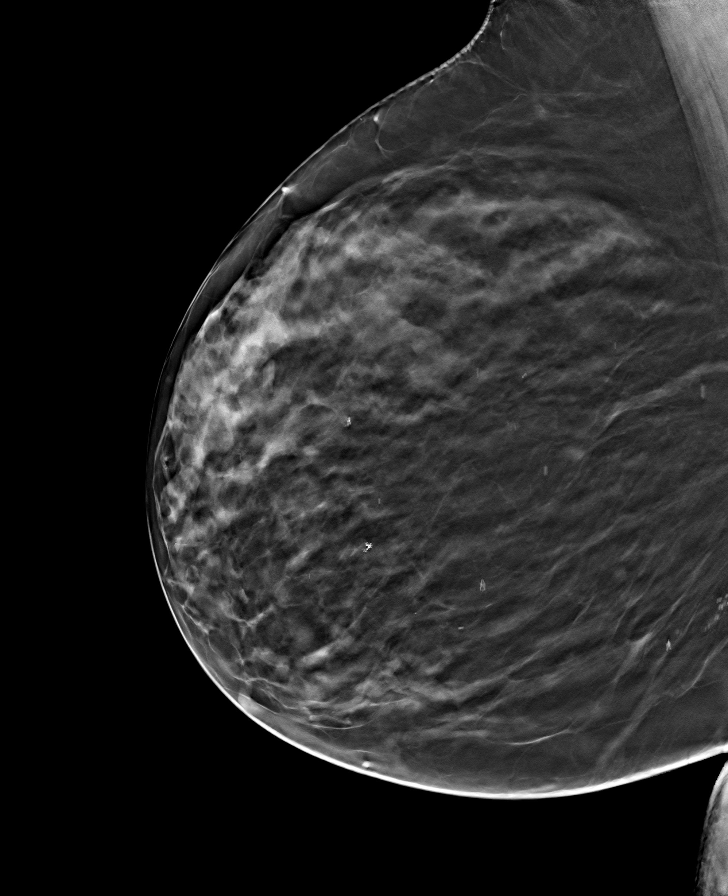

[L CC tomo · tomo slice 35/68.0]
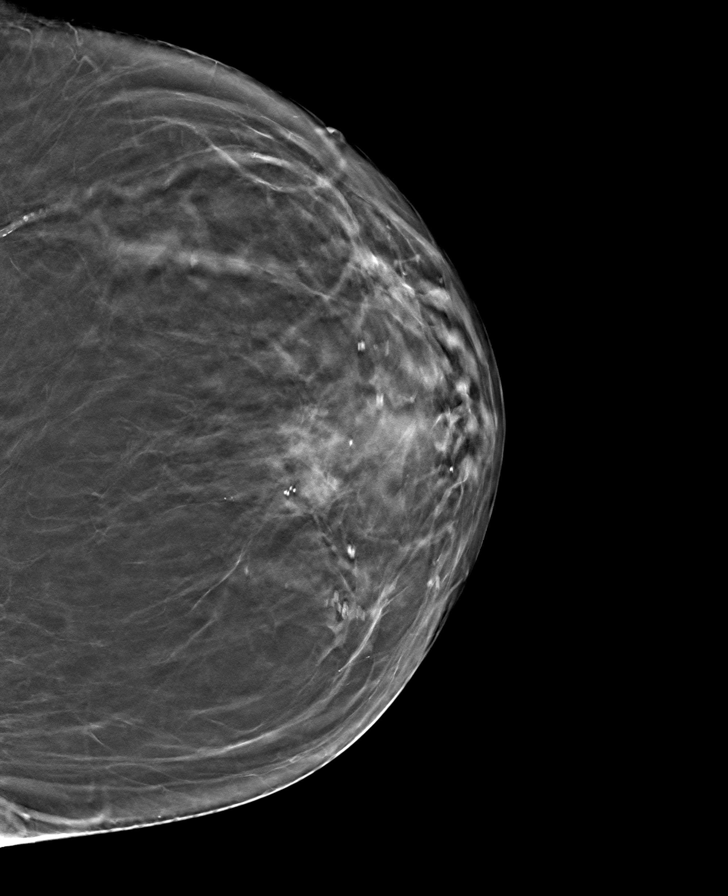

[L MLO tomo · tomo slice 42/83.0]
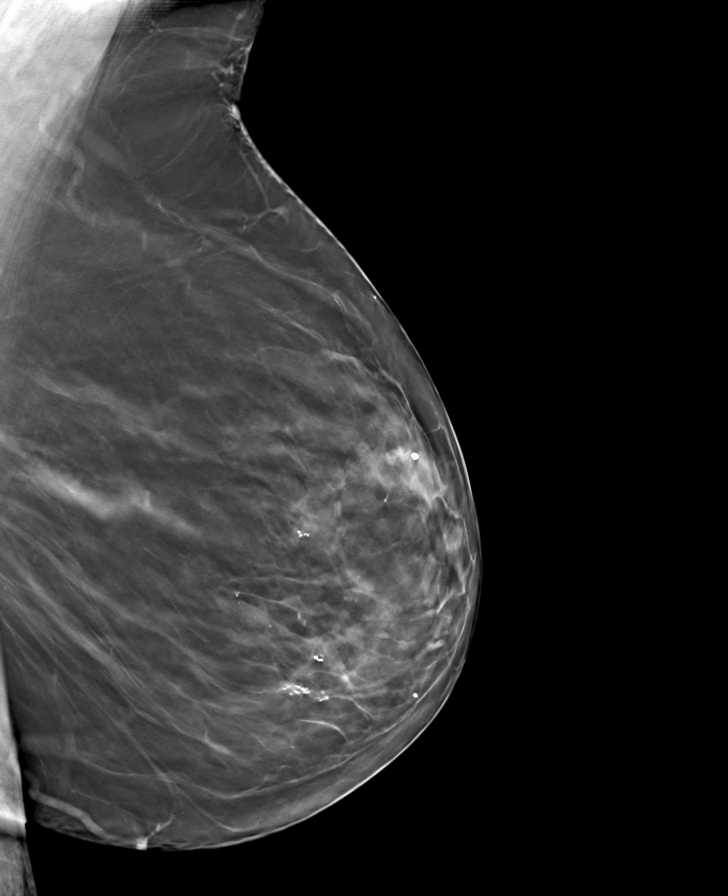

[R CC tomo · tomo slice 37/72.0]
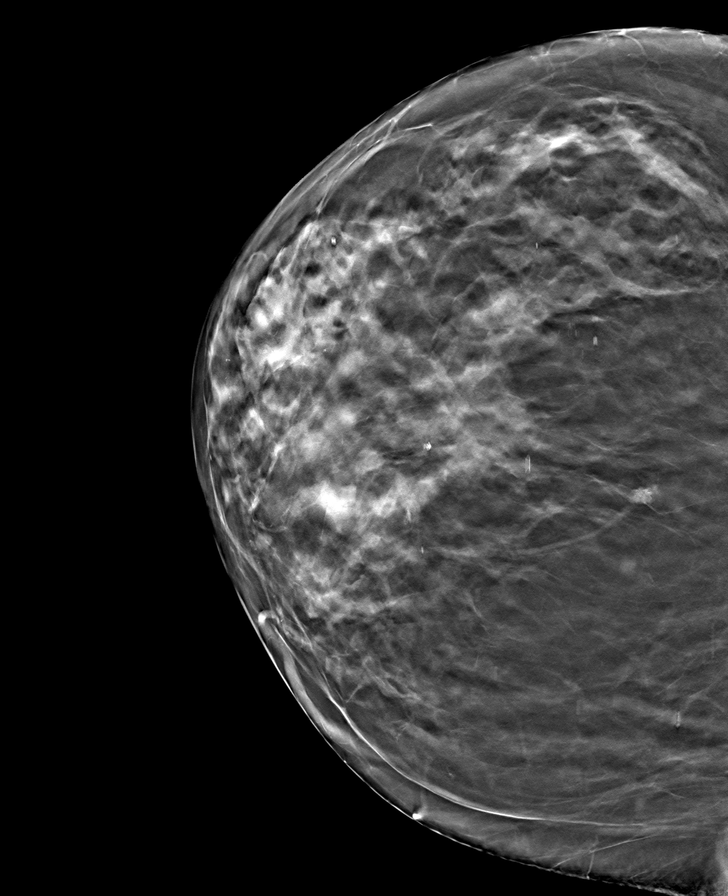

[8 of 24 positions shown; findings below may reference images not displayed]

ACR Breast Density Category c: The breast tissue is heterogeneously
dense, which may obscure small masses.
FINDINGS: There are no findings suspicious for malignancy. Images were
processed with CAD.
IMPRESSION: No mammographic evidence of malignancy. A result letter of this
screening mammogram will be mailed directly to the patient.

RECOMMENDATION:
Screening mammogram in one year. (Code:[id number])

BI-RADS CATEGORY  1: Negative.

## 2019-03-20 ENCOUNTER — Other Ambulatory Visit: Payer: Self-pay

## 2019-03-20 MED ORDER — PROPYLTHIOURACIL 50 MG PO TABS: 50.0000 mg | ORAL_TABLET | Freq: Every day | ORAL | 0 refills | Status: DC

## 2019-03-21 DIAGNOSIS — H353132 Nonexudative age-related macular degeneration, bilateral, intermediate dry stage: Secondary | ICD-10-CM | POA: Diagnosis not present

## 2019-03-21 DIAGNOSIS — Z961 Presence of intraocular lens: Secondary | ICD-10-CM | POA: Diagnosis not present

## 2019-03-21 DIAGNOSIS — H26492 Other secondary cataract, left eye: Secondary | ICD-10-CM | POA: Diagnosis not present

## 2019-03-26 DIAGNOSIS — M0589 Other rheumatoid arthritis with rheumatoid factor of multiple sites: Secondary | ICD-10-CM | POA: Diagnosis not present

## 2019-03-26 DIAGNOSIS — Z79899 Other long term (current) drug therapy: Secondary | ICD-10-CM | POA: Diagnosis not present

## 2019-04-02 ENCOUNTER — Other Ambulatory Visit: Payer: Self-pay

## 2019-04-02 ENCOUNTER — Ambulatory Visit: Payer: Medicare Other | Attending: Internal Medicine

## 2019-04-02 DIAGNOSIS — Z20822 Contact with and (suspected) exposure to covid-19: Secondary | ICD-10-CM

## 2019-04-03 LAB — NOVEL CORONAVIRUS, NAA: SARS-CoV-2, NAA: NOT DETECTED

## 2019-04-17 ENCOUNTER — Other Ambulatory Visit: Payer: Self-pay | Admitting: Endocrinology

## 2019-04-17 DIAGNOSIS — Z794 Long term (current) use of insulin: Secondary | ICD-10-CM | POA: Diagnosis not present

## 2019-04-17 DIAGNOSIS — E0590 Thyrotoxicosis, unspecified without thyrotoxic crisis or storm: Secondary | ICD-10-CM | POA: Diagnosis not present

## 2019-04-17 DIAGNOSIS — E119 Type 2 diabetes mellitus without complications: Secondary | ICD-10-CM | POA: Diagnosis not present

## 2019-04-18 LAB — T3, FREE: T3, Free: 3.1 pg/mL (ref 2.0–4.4)

## 2019-04-18 LAB — TSH: TSH: 0.015 u[IU]/mL — ABNORMAL LOW (ref 0.450–4.500)

## 2019-04-18 LAB — T4, FREE: Free T4: 1.05 ng/dL (ref 0.82–1.77)

## 2019-04-18 LAB — GLUCOSE, RANDOM: Glucose: 54 mg/dL — ABNORMAL LOW (ref 65–99)

## 2019-04-24 ENCOUNTER — Other Ambulatory Visit: Payer: Self-pay

## 2019-04-24 ENCOUNTER — Ambulatory Visit (INDEPENDENT_AMBULATORY_CARE_PROVIDER_SITE_OTHER): Payer: Medicare Other | Admitting: Endocrinology

## 2019-04-24 ENCOUNTER — Encounter: Payer: Self-pay | Admitting: Endocrinology

## 2019-04-24 DIAGNOSIS — E0590 Thyrotoxicosis, unspecified without thyrotoxic crisis or storm: Secondary | ICD-10-CM

## 2019-04-24 MED ORDER — PROPYLTHIOURACIL 50 MG PO TABS: 50.0000 mg | ORAL_TABLET | Freq: Two times a day (BID) | ORAL | 1 refills | Status: DC

## 2019-04-24 NOTE — Progress Notes (Signed)
Patient ID: [name], female   DOB: 1950/06/27, 69 y.o.   MRN: [id number]                                                                                                              Reason for Appointment: Endocrinology follow-up   Today's office visit was provided via telemedicine using video technique The patient was explained the limitations of evaluation and management by telemedicine and the availability of in person appointments.  The patient understood the limitations and agreed to proceed. Patient also understood that the telehealth visit is billable. . Location of the patient: Patient's home . Location of the provider: Physician office Only the patient and myself were participating in the encounter     Chief complaint: Follow-up   History of Present Illness:   She had a low level of TSH done during her hospitalization for COPD in April 2019 Also at that time she was tachycardic, was apparently fairly ill at that time with her COPD exacerbation  Evaluation did not show any evidence of toxic nodular goiter, hot nodule or Graves' disease With a trial of PTU 50 mg twice daily she subjectively did not feel any different and follow-up free T4 was below normal at 0.68 Also then her TSH was not significantly suppressed She was told to stop PTU   Since TSH was still relatively low in April 2020 she has been back in follow-up regularly now  TSH was undetectable in 11/2018 and 11/20 are free T3 was upper normal at 4.0 At that time she had no symptoms of unusual weight loss, palpitations, unusual fatigue or heat intolerance She has no shakiness of her hands  Previously did not have a goiter on exam  She was started back on PTU 50 mg daily in 11/20 With this she has not felt any different, rarely still may get palpitations but does not check her pulse  Her labs now show TSH to be less suppressed and T4 and T3 levels improved   Her thyroid scan in 10/2017 did not show any  overactive areas and uptake of 19%  Wt Readings from Last 3 Encounters:  02/26/19 261 lb (118.4 kg)  10/29/18 258 lb (117 kg)  08/09/18 253 lb (114.8 kg)      Thyroid function tests as follows:     Lab Results  Component Value Date   FREET4 1.05 04/17/2019   FREET4 1.24 02/12/2019   FREET4 0.98 11/22/2018   T3FREE 3.1 04/17/2019   T3FREE 4.0 02/12/2019   T3FREE 3.2 11/22/2018   TSH 0.015 (L) 04/17/2019   TSH 0.010 (L) 02/12/2019   TSH <0.01 (L) 11/22/2018    Lab Results  Component Value Date   THYROTRECAB <1.10 02/12/2019   THYROTRECAB 0.55 10/18/2017     Allergies as of 04/24/2019      Reactions   Demerol Nausea And Vomiting   Severe   Hydromorphone Hcl Itching   All over the body.   Tetracycline Hives      Medication List  Accurate as of April 24, 2019  9:20 AM. If you have any questions, ask your nurse or doctor.        acetaminophen 325 MG tablet Commonly known as: TYLENOL Take 650 mg by mouth every 6 (six) hours as needed (for pain.).   albuterol (2.5 MG/3ML) 0.083% nebulizer solution Commonly known as: PROVENTIL Take 3 mLs (2.5 mg total) by nebulization every 6 (six) hours as needed for wheezing or shortness of breath (dx: J43.9).   albuterol 108 (90 Base) MCG/ACT inhaler Commonly known as: ProAir HFA INHALE 2 PUFFS INTO THE LUNGS EVERY 6 HOURS AS NEEDED FOR WHEEZING OR SHORTNESS OF BREATH   ALPRAZolam 0.5 MG tablet Commonly known as: XANAX Take 0.5 mg by mouth 3 (three) times daily as needed for sleep or anxiety.   Benicar 20 MG tablet Generic drug: olmesartan Take 20 mg by mouth daily.   Bydureon 2 MG Pen Generic drug: Exenatide ER Inject 2 mg into the muscle every Saturday.   calamine lotion Apply 1 application topically as needed (bug bites).   diclofenac sodium 1 % Gel Commonly known as: VOLTAREN Apply 2-4 g topically 4 (four) times daily as needed for pain.   empagliflozin 25 MG Tabs tablet Commonly known as:  JARDIANCE Take 25 mg by mouth daily.   furosemide 40 MG tablet Commonly known as: LASIX Take 40 mg by mouth daily as needed for fluid. Fluid   gabapentin 600 MG tablet Commonly known as: NEURONTIN Take 600 mg by mouth at bedtime.   HumaLOG KwikPen 100 UNIT/ML KwikPen Generic drug: insulin lispro Inject 8-14 Units into the skin See admin instructions. Inject 8 units subcutaneously in the morning & inject 14 units subcutaneously at supper   hydrOXYzine 25 MG tablet Commonly known as: ATARAX/VISTARIL Take 25 mg by mouth 3 (three) times daily as needed (Neuropathic itch).   ibuprofen 200 MG tablet Commonly known as: ADVIL Take 400-600 mg by mouth every 8 (eight) hours as needed for moderate pain. Pain   Lantus SoloStar 100 UNIT/ML Solostar Pen Generic drug: Insulin Glargine Inject 70 Units into the skin 2 (two) times a day.   loratadine 10 MG tablet Commonly known as: CLARITIN Take 10 mg by mouth daily.   Lubricant Eye Drops 0.5 % Soln Generic drug: carboxymethylcellulose 1 drop 3 (three) times daily as needed.   metFORMIN 500 MG 24 hr tablet Commonly known as: GLUCOPHAGE-XR Take 2,000 mg by mouth daily.   methotrexate 2.5 MG tablet Commonly known as: RHEUMATREX Take 2.5 mg by mouth once a week. Caution:Chemotherapy. Protect from light.   montelukast 10 MG tablet Commonly known as: SINGULAIR TAKE 1 TABLET AT BEDTIME   omeprazole 40 MG capsule Commonly known as: PRILOSEC TAKE 1 CAPSULE EVERY MORNING What changed: when to take this   propylthiouracil 50 MG tablet Commonly known as: PTU Take 1 tablet (50 mg total) by mouth daily after breakfast.   REMICADE IV Inject 1 Dose into the vein every 6 (six) weeks.   sertraline 50 MG tablet Commonly known as: ZOLOFT Take 50 mg by mouth daily.   simvastatin 40 MG tablet Commonly known as: ZOCOR Take 40 mg by mouth daily with supper.   Trelegy Ellipta 100-62.5-25 MCG/INH Aepb Generic drug:  Fluticasone-Umeclidin-Vilant USE 1 INHALATION DAILY What changed: See the new instructions.           Past Medical History:  Diagnosis Date  . Asthma   . COPD (chronic obstructive pulmonary disease) ([place])   . Depression   .  DM type 1 (diabetes mellitus, type 1) ([place])   . GERD (gastroesophageal reflux disease)   . Hyperlipidemia   . Hypertension   . Morbid obesity ([place])   . Obstructive sleep apnea syndrome   . Osteoarthritis of right knee 04/2010   end-stage  . PONV (postoperative nausea and vomiting)   . Rheumatoid arthritis(714.0)    on methotrexate therapy  . Septic arthritis of knee, right ([place]) 04/2010    Past Surgical History:  Procedure Laterality Date  . ABDOMINAL HYSTERECTOMY  1992  . ABDOMINAL WALL MESH  REMOVAL  08/13/2009   with removal of retained stitches  . [place]   for cardiac tamponade  . APPENDECTOMY  1967  . BIOPSY  10/29/2018   Procedure: BIOPSY;  Surgeon: Doran Stabler, MD;  Location: [facility];  Service: Gastroenterology;;  . BONE BIOPSY Right 04/19/2016   Procedure: BONE BIOPSY RIGHT GREAT TOE;  Surgeon: Caprice Beaver, DPM;  Location: [facility];  Service: Podiatry;  Laterality: Right;  . CHOLECYSTECTOMY  1992   laparoscopic  . COLONOSCOPY  12/08/2011   Procedure: COLONOSCOPY;  Surgeon: Rogene Houston, MD;  [facility];  Service: Endoscopy;  Laterality: N/A;  830  . COLONOSCOPY WITH PROPOFOL N/A 10/16/2017   Procedure: COLONOSCOPY WITH PROPOFOL;  Surgeon: Doran Stabler, MD;  [facility];  Service: Gastroenterology;  Laterality: N/A;  . COLONOSCOPY WITH PROPOFOL N/A 10/29/2018   Procedure: COLONOSCOPY WITH PROPOFOL;  Surgeon: Doran Stabler, MD;  [facility];  Service: Gastroenterology;  Laterality: N/A;  . ESOPHAGOGASTRODUODENOSCOPY (EGD) WITH PROPOFOL N/A 10/29/2018   Procedure: ESOPHAGOGASTRODUODENOSCOPY (EGD) WITH PROPOFOL;  Surgeon: Doran Stabler, MD;  Location: [facility];  Service: Gastroenterology;  Laterality: N/A;  . FOREIGN BODY REMOVAL ABDOMINAL  01/04/2010   stitch abscesses  . HERNIA REPAIR  2001   open LOA / [place] repair w mesh.  Dr. Arnoldo Morale  . HERNIA REPAIR  2007   open redo LOA / [place] repair w mesh.  Dr. Arnoldo Morale  . POLYPECTOMY  10/16/2017   Procedure: POLYPECTOMY;  Surgeon: Doran Stabler, MD;  Location: [facility];  Service: Gastroenterology;;  . POLYPECTOMY  10/29/2018   Procedure: POLYPECTOMY;  Surgeon: Doran Stabler, MD;  Location: [facility];  Service: Gastroenterology;;  . REVISION TOTAL KNEE ARTHROPLASTY  06/09/2010   I and D   . TOTAL KNEE ARTHROPLASTY  05/07/2010   right knee    Family History  Problem Relation Age of Onset  . Cancer Mother   . Hyperlipidemia Mother   . Diabetes Mother   . Thyroid disease Mother   . Diabetes Father   . COPD Father   . Arthritis Father        RA  . Hyperlipidemia Brother   . Hypertension Brother     Social History:  reports that she quit smoking about 27 years ago. Her smoking use included cigarettes. She has a 50.00 pack-year smoking history. She has never used smokeless tobacco. She reports that she does not drink alcohol or use drugs.  Allergies:  Allergies  Allergen Reactions  . Demerol Nausea And Vomiting    Severe  . Hydromorphone Hcl Itching    All over the body.  . Tetracycline Hives     Review of Systems  She has been on insulin for her diabetes since 1992 Also on Jardiance, Bydureon and metformin her medications are being prescribed by her PCP  Most recent  A1c was 5.5 done in 01/2019, she does have issues with anemia  She says that recently she has been modifying her diet and with cutting back on higher glycemic index foods her sugars have been getting progressively lower She has cut down her morning insulin to 60 units instead of 70. However her lab glucose showed blood sugar to be 54, she says occasionally she will feel hypoglycemic in the  afternoon   Lab Results  Component Value Date   HGBA1C 6.6 (A) 12/22/2017   HGBA1C 7.8 (H) 09/16/2016   HGBA1C (H) 06/10/2010    7.0 (NOTE)                                                                       According to the ADA Clinical Practice Recommendations for 2011, when HbA1c is used as a screening test:   >=6.5%   Diagnostic of Diabetes Mellitus           (if abnormal result  is confirmed)  5.7-6.4%   Increased risk of developing Diabetes Mellitus  References:Diagnosis and Classification of Diabetes Mellitus,Diabetes D8842878 1):S62-S69 and Standards of Medical Care in         Diabetes - 2011,Diabetes Care,2011,34  (Suppl 1):S11-S61.   Lab Results  Component Value Date   CREATININE 0.92 07/30/2017     HYPERTENSION: She is taking Benicar 20 mg daily followed by PCP    Examination:   There were no vitals taken for this visit.     Assessment/Plan:  Subclinical Hyperthyroidism  She has had a persistently suppressed TSH without symptomatic or overt hyperthyroidism Initially evaluated in 2019 No evidence of toxic nodular goiter, nodules or Graves' disease on previous studies Also no history of goiter and previous thyroid scan was normal  Despite her T3 level being upper normal previously she did not have any symptoms on her last visit  She is now on PTU empirically 50 mg daily only, previously on 30 mg twice daily her free T4 had gone low subjectively still doing well She is losing weight with adjusting her diet  Recommend that we go up to 50 mg twice daily on her PTU since her TSH is still significantly low and follow-up in 2 months  Diabetes: Appears to be getting hypoglycemia in the afternoon and may not be all the symptomatic discussed that she should avoid low blood sugars Lab glucose was 54 in the early afternoon and she occasionally reports low sugars in the afternoon also  She needs to reduce her Lantus down to 55 units at least instead of 60  and follow-up with PCP     Elayne Snare 04/24/2019, 9:20 AM       Note: This office note was prepared with Dragon voice recognition system technology. Any transcriptional errors that result from this process are unintentional.

## 2019-05-06 DIAGNOSIS — E1140 Type 2 diabetes mellitus with diabetic neuropathy, unspecified: Secondary | ICD-10-CM | POA: Diagnosis not present

## 2019-05-07 DIAGNOSIS — M0589 Other rheumatoid arthritis with rheumatoid factor of multiple sites: Secondary | ICD-10-CM | POA: Diagnosis not present

## 2019-05-13 DIAGNOSIS — E0590 Thyrotoxicosis, unspecified without thyrotoxic crisis or storm: Secondary | ICD-10-CM | POA: Diagnosis not present

## 2019-05-13 DIAGNOSIS — E1140 Type 2 diabetes mellitus with diabetic neuropathy, unspecified: Secondary | ICD-10-CM | POA: Diagnosis not present

## 2019-05-13 DIAGNOSIS — J449 Chronic obstructive pulmonary disease, unspecified: Secondary | ICD-10-CM | POA: Diagnosis not present

## 2019-05-14 DIAGNOSIS — H353130 Nonexudative age-related macular degeneration, bilateral, stage unspecified: Secondary | ICD-10-CM | POA: Diagnosis not present

## 2019-05-25 ENCOUNTER — Ambulatory Visit: Payer: Medicare Other | Attending: Internal Medicine

## 2019-05-25 DIAGNOSIS — Z23 Encounter for immunization: Secondary | ICD-10-CM | POA: Insufficient documentation

## 2019-05-25 NOTE — Progress Notes (Signed)
   Covid-19 Vaccination Clinic  Name:  [name]    MRN: [id number] DOB: June 11, 1950  05/25/2019  [name] was observed post Covid-19 immunization for 15 minutes without incidence. She was provided with Vaccine Information Sheet and instruction to access the V-Safe system.   [name] was instructed to call 911 with any severe reactions post vaccine: Marland Kitchen Difficulty breathing  . Swelling of your face and throat  . A fast heartbeat  . A bad rash all over your body  . Dizziness and weakness    Immunizations Administered    Name Date Dose VIS Date Route   Pfizer COVID-19 Vaccine 05/25/2019  1:15 PM 0.3 mL 03/22/2019 Intramuscular   Manufacturer: [place]   Lot: [id number]   [place]: [id number]

## 2019-06-03 DIAGNOSIS — E1342 Other specified diabetes mellitus with diabetic polyneuropathy: Secondary | ICD-10-CM | POA: Diagnosis not present

## 2019-06-03 DIAGNOSIS — L851 Acquired keratosis [keratoderma] palmaris et plantaris: Secondary | ICD-10-CM | POA: Diagnosis not present

## 2019-06-03 DIAGNOSIS — B351 Tinea unguium: Secondary | ICD-10-CM | POA: Diagnosis not present

## 2019-06-12 ENCOUNTER — Telehealth: Payer: Self-pay | Admitting: Endocrinology

## 2019-06-12 NOTE — Telephone Encounter (Signed)
Patient called to request that labs be faxed to [facility] in [place] - fax sent to [phone] - fax successful

## 2019-06-13 ENCOUNTER — Other Ambulatory Visit: Payer: Self-pay | Admitting: Endocrinology

## 2019-06-13 DIAGNOSIS — E0590 Thyrotoxicosis, unspecified without thyrotoxic crisis or storm: Secondary | ICD-10-CM | POA: Diagnosis not present

## 2019-06-13 LAB — TSH: TSH: 0.6 (ref <=5.90)

## 2019-06-14 LAB — T3, FREE: T3, Free: 2.7 pg/mL (ref 2.0–4.4)

## 2019-06-14 LAB — TSH: TSH: 0.603 u[IU]/mL (ref 0.450–4.500)

## 2019-06-14 LAB — T4, FREE: Free T4: 0.78 ng/dL — ABNORMAL LOW (ref 0.82–1.77)

## 2019-06-17 ENCOUNTER — Ambulatory Visit: Payer: Medicare Other | Attending: Internal Medicine

## 2019-06-17 DIAGNOSIS — Z23 Encounter for immunization: Secondary | ICD-10-CM | POA: Insufficient documentation

## 2019-06-17 NOTE — Progress Notes (Signed)
   Covid-19 Vaccination Clinic  Name:  [name]    MRN: [id number] DOB: 1951-03-04  06/17/2019  [name] was observed post Covid-19 immunization for 15 minutes without incident. She was provided with Vaccine Information Sheet and instruction to access the V-Safe system.   Ms. Strength was instructed to call 911 with any severe reactions post vaccine: Marland Kitchen Difficulty breathing  . Swelling of face and throat  . A fast heartbeat  . A bad rash all over body  . Dizziness and weakness   Immunizations Administered    Name Date Dose VIS Date Route   Pfizer COVID-19 Vaccine 06/17/2019 10:27 AM 0.3 mL 03/22/2019 Intramuscular   Manufacturer: [place]   Lot: [id number]   [place]: [id number]

## 2019-06-18 ENCOUNTER — Other Ambulatory Visit: Payer: Self-pay

## 2019-06-18 DIAGNOSIS — M0589 Other rheumatoid arthritis with rheumatoid factor of multiple sites: Secondary | ICD-10-CM | POA: Diagnosis not present

## 2019-06-18 DIAGNOSIS — Z79899 Other long term (current) drug therapy: Secondary | ICD-10-CM | POA: Diagnosis not present

## 2019-06-18 NOTE — Telephone Encounter (Signed)
Patient called today to make [place] aware that she had labs drawn at [facility] in [place] on 06/13/19-FYI

## 2019-06-19 ENCOUNTER — Encounter: Payer: Self-pay | Admitting: Endocrinology

## 2019-06-19 ENCOUNTER — Ambulatory Visit (INDEPENDENT_AMBULATORY_CARE_PROVIDER_SITE_OTHER): Payer: Medicare Other | Admitting: Endocrinology

## 2019-06-19 ENCOUNTER — Other Ambulatory Visit: Payer: Self-pay

## 2019-06-19 DIAGNOSIS — E0590 Thyrotoxicosis, unspecified without thyrotoxic crisis or storm: Secondary | ICD-10-CM | POA: Diagnosis not present

## 2019-06-19 DIAGNOSIS — I10 Essential (primary) hypertension: Secondary | ICD-10-CM | POA: Diagnosis not present

## 2019-06-19 DIAGNOSIS — Z8249 Family history of ischemic heart disease and other diseases of the circulatory system: Secondary | ICD-10-CM | POA: Diagnosis not present

## 2019-06-19 DIAGNOSIS — I313 Pericardial effusion (noninflammatory): Secondary | ICD-10-CM | POA: Diagnosis not present

## 2019-06-19 DIAGNOSIS — J449 Chronic obstructive pulmonary disease, unspecified: Secondary | ICD-10-CM | POA: Diagnosis not present

## 2019-06-19 NOTE — Progress Notes (Signed)
Patient ID: [name], female   DOB: 05-15-1950, 69 y.o.   MRN: [id number]                                                                                                              Reason for Appointment: Endocrinology follow-up   Today's office visit was provided via telemedicine using video technique The patient was explained the limitations of evaluation and management by telemedicine and the availability of in person appointments.  The patient understood the limitations and agreed to proceed. Patient also understood that the telehealth visit is billable. . Location of the patient: Patient's home . Location of the provider: Physician office Only the patient and myself were participating in the encounter     Chief complaint: Follow-up   History of Present Illness:   She had a low level of TSH done during her hospitalization for COPD in April 2019 Also at that time she was tachycardic, was apparently fairly ill at that time with her COPD exacerbation  Evaluation did not show any evidence of toxic nodular goiter, hot nodule or Graves' disease With a trial of PTU 50 mg twice daily she subjectively did not feel any different and follow-up free T4 was below normal at 0.68 Also then her TSH was not significantly suppressed She was told to stop PTU   Since TSH was still relatively low in April 2020 she has been back in follow-up regularly now  TSH was undetectable in 11/2018 and 11/20 are free T3 was upper normal at 4.0 At that time she had no symptoms of unusual weight loss, palpitations, unusual fatigue or heat intolerance She has no shakiness of her hands  Previously did not have a goiter on exam  She was started back on PTU 50 mg daily in 11/20 However since her TSH was still low at 0.015 she was told to go up to twice a day on this  She feels fairly good and has no lethargy, heat or cold intolerance, shakiness or palpitations No unusual weight change lately  Her labs  now show TSH to be back to normal at 0.6 However her free T4 is slightly below normal, free T3 is further improved at 2.7   Her thyroid scan in 10/2017 did not show any overactive areas and uptake of 19%  Wt Readings from Last 3 Encounters:  02/26/19 261 lb (118.4 kg)  10/29/18 258 lb (117 kg)  08/09/18 253 lb (114.8 kg)      Thyroid function tests as follows:     Lab Results  Component Value Date   FREET4 0.78 (L) 06/13/2019   FREET4 1.05 04/17/2019   FREET4 1.24 02/12/2019   T3FREE 2.7 06/13/2019   T3FREE 3.1 04/17/2019   T3FREE 4.0 02/12/2019   TSH 0.603 06/13/2019   TSH 0.60 06/13/2019   TSH 0.015 (L) 04/17/2019    Lab Results  Component Value Date   THYROTRECAB <1.10 02/12/2019   THYROTRECAB 0.55 10/18/2017     Allergies as of 06/19/2019  Reactions   Demerol Nausea And Vomiting   Severe   Hydromorphone Hcl Itching   All over the body.   Tetracycline Hives      Medication List       Accurate as of June 19, 2019  2:59 PM. If you have any questions, ask your nurse or doctor.        acetaminophen 325 MG tablet Commonly known as: TYLENOL Take 650 mg by mouth every 6 (six) hours as needed (for pain.).   albuterol (2.5 MG/3ML) 0.083% nebulizer solution Commonly known as: PROVENTIL Take 3 mLs (2.5 mg total) by nebulization every 6 (six) hours as needed for wheezing or shortness of breath (dx: J43.9).   albuterol 108 (90 Base) MCG/ACT inhaler Commonly known as: ProAir HFA INHALE 2 PUFFS INTO THE LUNGS EVERY 6 HOURS AS NEEDED FOR WHEEZING OR SHORTNESS OF BREATH   ALPRAZolam 0.5 MG tablet Commonly known as: XANAX Take 0.5 mg by mouth 3 (three) times daily as needed for sleep or anxiety.   Benicar 20 MG tablet Generic drug: olmesartan Take 20 mg by mouth daily.   Bydureon 2 MG Pen Generic drug: Exenatide ER Inject 2 mg into the muscle every Saturday.   calamine lotion Apply 1 application topically as needed (bug bites).   diclofenac sodium 1 %  Gel Commonly known as: VOLTAREN Apply 2-4 g topically 4 (four) times daily as needed for pain.   empagliflozin 25 MG Tabs tablet Commonly known as: JARDIANCE Take 25 mg by mouth daily.   furosemide 40 MG tablet Commonly known as: LASIX Take 40 mg by mouth daily as needed for fluid. Fluid   gabapentin 600 MG tablet Commonly known as: NEURONTIN Take 600 mg by mouth at bedtime.   HumaLOG KwikPen 100 UNIT/ML KwikPen Generic drug: insulin lispro Inject 8-14 Units into the skin See admin instructions. Inject 8 units subcutaneously in the morning & inject 14 units subcutaneously at supper   hydrOXYzine 25 MG tablet Commonly known as: ATARAX/VISTARIL Take 25 mg by mouth 3 (three) times daily as needed (Neuropathic itch).   ibuprofen 200 MG tablet Commonly known as: ADVIL Take 400-600 mg by mouth every 8 (eight) hours as needed for moderate pain. Pain   Lantus SoloStar 100 UNIT/ML Solostar Pen Generic drug: insulin glargine Inject 70 Units into the skin 2 (two) times a day.   loratadine 10 MG tablet Commonly known as: CLARITIN Take 10 mg by mouth daily.   Lubricant Eye Drops 0.5 % Soln Generic drug: carboxymethylcellulose 1 drop 3 (three) times daily as needed.   metFORMIN 500 MG 24 hr tablet Commonly known as: GLUCOPHAGE-XR Take 2,000 mg by mouth daily.   methotrexate 2.5 MG tablet Commonly known as: RHEUMATREX Take 2.5 mg by mouth once a week. Caution:Chemotherapy. Protect from light.   montelukast 10 MG tablet Commonly known as: SINGULAIR TAKE 1 TABLET AT BEDTIME   omeprazole 40 MG capsule Commonly known as: PRILOSEC TAKE 1 CAPSULE EVERY MORNING What changed: when to take this   propylthiouracil 50 MG tablet Commonly known as: PTU Take 1 tablet (50 mg total) by mouth 2 (two) times daily.   REMICADE IV Inject 1 Dose into the vein every 6 (six) weeks.   sertraline 50 MG tablet Commonly known as: ZOLOFT Take 50 mg by mouth daily.   simvastatin 40 MG  tablet Commonly known as: ZOCOR Take 40 mg by mouth daily with supper.   Trelegy Ellipta 100-62.5-25 MCG/INH Aepb Generic drug: Fluticasone-Umeclidin-Vilant USE 1 INHALATION DAILY What  changed: See the new instructions.           Past Medical History:  Diagnosis Date  . Asthma   . COPD (chronic obstructive pulmonary disease) ([place])   . Depression   . DM type 1 (diabetes mellitus, type 1) ([place])   . GERD (gastroesophageal reflux disease)   . Hyperlipidemia   . Hypertension   . Morbid obesity ([place])   . Obstructive sleep apnea syndrome   . Osteoarthritis of right knee 04/2010   end-stage  . PONV (postoperative nausea and vomiting)   . Rheumatoid arthritis(714.0)    on methotrexate therapy  . Septic arthritis of knee, right ([place]) 04/2010    Past Surgical History:  Procedure Laterality Date  . ABDOMINAL HYSTERECTOMY  1992  . ABDOMINAL WALL MESH  REMOVAL  08/13/2009   with removal of retained stitches  . [place]   for cardiac tamponade  . APPENDECTOMY  1967  . BIOPSY  10/29/2018   Procedure: BIOPSY;  Surgeon: Doran Stabler, MD;  Location: [facility];  Service: Gastroenterology;;  . BONE BIOPSY Right 04/19/2016   Procedure: BONE BIOPSY RIGHT GREAT TOE;  Surgeon: Caprice Beaver, DPM;  Location: [facility];  Service: Podiatry;  Laterality: Right;  . CHOLECYSTECTOMY  1992   laparoscopic  . COLONOSCOPY  12/08/2011   Procedure: COLONOSCOPY;  Surgeon: Rogene Houston, MD;  Location: [facility];  Service: Endoscopy;  Laterality: N/A;  830  . COLONOSCOPY WITH PROPOFOL N/A 10/16/2017   Procedure: COLONOSCOPY WITH PROPOFOL;  Surgeon: Doran Stabler, MD;  Location: [facility];  Service: Gastroenterology;  Laterality: N/A;  . COLONOSCOPY WITH PROPOFOL N/A 10/29/2018   Procedure: COLONOSCOPY WITH PROPOFOL;  Surgeon: Doran Stabler, MD;  Location: [facility];  Service: Gastroenterology;  Laterality: N/A;  . ESOPHAGOGASTRODUODENOSCOPY (EGD) WITH  PROPOFOL N/A 10/29/2018   Procedure: ESOPHAGOGASTRODUODENOSCOPY (EGD) WITH PROPOFOL;  Surgeon: Doran Stabler, MD;  Location: [facility];  Service: Gastroenterology;  Laterality: N/A;  . FOREIGN BODY REMOVAL ABDOMINAL  01/04/2010   stitch abscesses  . HERNIA REPAIR  2001   open LOA / [place] repair w mesh.  Dr. Arnoldo Morale  . HERNIA REPAIR  2007   open redo LOA / [place] repair w mesh.  Dr. Arnoldo Morale  . POLYPECTOMY  10/16/2017   Procedure: POLYPECTOMY;  Surgeon: Doran Stabler, MD;  Location: [facility];  Service: Gastroenterology;;  . POLYPECTOMY  10/29/2018   Procedure: POLYPECTOMY;  Surgeon: Doran Stabler, MD;  Location: [facility];  Service: Gastroenterology;;  . REVISION TOTAL KNEE ARTHROPLASTY  06/09/2010   I and D   . TOTAL KNEE ARTHROPLASTY  05/07/2010   right knee    Family History  Problem Relation Age of Onset  . Cancer Mother   . Hyperlipidemia Mother   . Diabetes Mother   . Thyroid disease Mother   . Diabetes Father   . COPD Father   . Arthritis Father        RA  . Hyperlipidemia Brother   . Hypertension Brother     Social History:  reports that she quit smoking about 27 years ago. Her smoking use included cigarettes. She has a 50.00 pack-year smoking history. She has never used smokeless tobacco. She reports that she does not drink alcohol or use drugs.  Allergies:  Allergies  Allergen Reactions  . Demerol Nausea And Vomiting    Severe  . Hydromorphone Hcl Itching    All over the  body.  . Tetracycline Hives     Review of Systems  She has been on insulin for her diabetes since 1992 Also on Jardiance, Bydureon and metformin her medications are being prescribed by her PCP  Most recent A1c was 5.5 done in 01/2019, she does have issues with anemia  She still watches her diet well She has cut down her morning insulin to 50 units now because of hypoglycemia No further hypoglycemia   Lab Results  Component Value Date   HGBA1C 6.6 (A) 12/22/2017    HGBA1C 7.8 (H) 09/16/2016   HGBA1C (H) 06/10/2010    7.0 (NOTE)                                                                       According to the ADA Clinical Practice Recommendations for 2011, when HbA1c is used as a screening test:   >=6.5%   Diagnostic of Diabetes Mellitus           (if abnormal result  is confirmed)  5.7-6.4%   Increased risk of developing Diabetes Mellitus  References:Diagnosis and Classification of Diabetes Mellitus,Diabetes [id number] 1):S62-S69 and Standards of Medical Care in         Diabetes - 2011,Diabetes Care,2011,34  (Suppl 1):S11-S61.   Lab Results  Component Value Date   CREATININE 0.92 07/30/2017     HYPERTENSION: She is taking Benicar 20 mg daily followed by PCP    Examination:   There were no vitals taken for this visit.     Assessment/Plan:  Subclinical Hyperthyroidism  She has had a persistently suppressed TSH without symptomatic or overt hyperthyroidism Initially evaluated in 2019 No evidence of toxic nodular goiter, nodules or Graves' disease on previous studies Also no history of goiter and previous thyroid scan was normal  At baseline her T3 level was upper normal without any symptoms  Again with trying PTU twice a day her free T4 is low normal she is asymptomatic Now TSH is back to normal and free T3 is 2.7  She can again try to go back to PTU once a day and likely stop this in 2 months when she comes back for follow-up  Continue follow-up with PCP for diabetes     Elayne Snare 06/19/2019, 2:59 PM       Note: This office note was prepared with Dragon voice recognition system technology. Any transcriptional errors that result from this process are unintentional.

## 2019-06-25 DIAGNOSIS — I313 Pericardial effusion (noninflammatory): Secondary | ICD-10-CM | POA: Diagnosis not present

## 2019-06-25 DIAGNOSIS — M150 Primary generalized (osteo)arthritis: Secondary | ICD-10-CM | POA: Diagnosis not present

## 2019-06-25 DIAGNOSIS — Z6841 Body Mass Index (BMI) 40.0 and over, adult: Secondary | ICD-10-CM | POA: Diagnosis not present

## 2019-06-25 DIAGNOSIS — Z8249 Family history of ischemic heart disease and other diseases of the circulatory system: Secondary | ICD-10-CM | POA: Diagnosis not present

## 2019-06-25 DIAGNOSIS — M545 Low back pain: Secondary | ICD-10-CM | POA: Diagnosis not present

## 2019-06-25 DIAGNOSIS — J449 Chronic obstructive pulmonary disease, unspecified: Secondary | ICD-10-CM | POA: Diagnosis not present

## 2019-06-25 DIAGNOSIS — L299 Pruritus, unspecified: Secondary | ICD-10-CM | POA: Diagnosis not present

## 2019-06-25 DIAGNOSIS — M0589 Other rheumatoid arthritis with rheumatoid factor of multiple sites: Secondary | ICD-10-CM | POA: Diagnosis not present

## 2019-06-25 DIAGNOSIS — I10 Essential (primary) hypertension: Secondary | ICD-10-CM | POA: Diagnosis not present

## 2019-06-25 DIAGNOSIS — Z79899 Other long term (current) drug therapy: Secondary | ICD-10-CM | POA: Diagnosis not present

## 2019-07-30 DIAGNOSIS — M0589 Other rheumatoid arthritis with rheumatoid factor of multiple sites: Secondary | ICD-10-CM | POA: Diagnosis not present

## 2019-08-03 DIAGNOSIS — I10 Essential (primary) hypertension: Secondary | ICD-10-CM | POA: Diagnosis not present

## 2019-08-09 DIAGNOSIS — J449 Chronic obstructive pulmonary disease, unspecified: Secondary | ICD-10-CM | POA: Diagnosis not present

## 2019-08-09 DIAGNOSIS — Z79899 Other long term (current) drug therapy: Secondary | ICD-10-CM | POA: Diagnosis not present

## 2019-08-09 DIAGNOSIS — E0590 Thyrotoxicosis, unspecified without thyrotoxic crisis or storm: Secondary | ICD-10-CM | POA: Diagnosis not present

## 2019-08-09 DIAGNOSIS — E1140 Type 2 diabetes mellitus with diabetic neuropathy, unspecified: Secondary | ICD-10-CM | POA: Diagnosis not present

## 2019-08-12 DIAGNOSIS — B351 Tinea unguium: Secondary | ICD-10-CM | POA: Diagnosis not present

## 2019-08-12 DIAGNOSIS — L851 Acquired keratosis [keratoderma] palmaris et plantaris: Secondary | ICD-10-CM | POA: Diagnosis not present

## 2019-08-12 DIAGNOSIS — E1342 Other specified diabetes mellitus with diabetic polyneuropathy: Secondary | ICD-10-CM | POA: Diagnosis not present

## 2019-08-15 ENCOUNTER — Other Ambulatory Visit: Payer: Self-pay | Admitting: Endocrinology

## 2019-08-15 DIAGNOSIS — E1140 Type 2 diabetes mellitus with diabetic neuropathy, unspecified: Secondary | ICD-10-CM | POA: Diagnosis not present

## 2019-08-15 DIAGNOSIS — E0590 Thyrotoxicosis, unspecified without thyrotoxic crisis or storm: Secondary | ICD-10-CM | POA: Diagnosis not present

## 2019-08-15 DIAGNOSIS — J449 Chronic obstructive pulmonary disease, unspecified: Secondary | ICD-10-CM | POA: Diagnosis not present

## 2019-08-15 DIAGNOSIS — M0600 Rheumatoid arthritis without rheumatoid factor, unspecified site: Secondary | ICD-10-CM | POA: Diagnosis not present

## 2019-08-15 DIAGNOSIS — E785 Hyperlipidemia, unspecified: Secondary | ICD-10-CM | POA: Diagnosis not present

## 2019-08-16 ENCOUNTER — Ambulatory Visit (INDEPENDENT_AMBULATORY_CARE_PROVIDER_SITE_OTHER): Payer: Medicare Other

## 2019-08-16 ENCOUNTER — Other Ambulatory Visit: Payer: Self-pay

## 2019-08-16 ENCOUNTER — Ambulatory Visit (INDEPENDENT_AMBULATORY_CARE_PROVIDER_SITE_OTHER): Payer: Medicare Other | Admitting: Pulmonary Disease

## 2019-08-16 ENCOUNTER — Encounter: Payer: Self-pay | Admitting: Pulmonary Disease

## 2019-08-16 VITALS — BP 136/60 | HR 88 | Temp 97.7°F | Ht 62.0 in | Wt 264.6 lb

## 2019-08-16 DIAGNOSIS — R05 Cough: Secondary | ICD-10-CM

## 2019-08-16 DIAGNOSIS — G4733 Obstructive sleep apnea (adult) (pediatric): Secondary | ICD-10-CM | POA: Diagnosis not present

## 2019-08-16 DIAGNOSIS — E669 Obesity, unspecified: Secondary | ICD-10-CM | POA: Diagnosis not present

## 2019-08-16 DIAGNOSIS — R053 Chronic cough: Secondary | ICD-10-CM

## 2019-08-16 DIAGNOSIS — G4730 Sleep apnea, unspecified: Secondary | ICD-10-CM | POA: Diagnosis not present

## 2019-08-16 DIAGNOSIS — J449 Chronic obstructive pulmonary disease, unspecified: Secondary | ICD-10-CM

## 2019-08-16 LAB — T3, FREE: T3, Free: 2.6 pg/mL (ref 2.0–4.4)

## 2019-08-16 LAB — TSH: TSH: 0.349 u[IU]/mL — ABNORMAL LOW (ref 0.450–4.500)

## 2019-08-16 LAB — T4, FREE: Free T4: 0.8 ng/dL — ABNORMAL LOW (ref 0.82–1.77)

## 2019-08-16 MED ORDER — DOXYCYCLINE HYCLATE 100 MG PO TABS: 100.0000 mg | ORAL_TABLET | Freq: Two times a day (BID) | ORAL | 0 refills | Status: DC

## 2019-08-16 NOTE — Progress Notes (Signed)
[facility]  Chief Complaint  Patient presents with  . Follow-up    pt states cough had for month. pt states mucus is yellow.pt states cpapmachine is cutting off while sleep needs new one    Constitutional:  BP 136/60 (BP Location: Left Arm, Cuff Size: Normal)   Pulse 88   Temp 97.7 F (36.5 C) (Temporal)   Ht 5\' 2"  (1.575 m)   Wt 264 lb 9.6 oz (120 kg)   SpO2 93%   BMI 48.40 kg/m   Past Medical History:  RA, HTN, HLD, GERD, DM, Depression  Brief Summary:  [name] is a 69 y.o. female former smoker with obstructive sleep apnea and COPD.  Subjective:  She has been suffering from a cough for the past few months.  Brings up thick clear to yellow sputum.  Was given doxycyline in January and this helped, but then cough returned.  Not having fever, chest pain, sinus congestion, post nasal drip, sore throat, reflux, or skin rash.  Gets episodes of wheezing when she has to cough, but resolves once she brings up sputum.  She uses CPAP nightly.  Her machine is quite old.  It has been cutting off in the night on it's own.  No issues with mask fit.  She would like to use [facility] in [place], [place] for her DME.  Physical Exam:   Appearance - well kempt   ENMT - no sinus tenderness, no oral exudate, no LAN, Mallampati 3 airway, no stridor  Respiratory - equal breath sounds bilaterally, no wheezing or rales  CV - s1s2 regular rate and rhythm, no murmurs  Ext - no clubbing, no edema  Skin - no rashes  Psych - normal mood and affect   Assessment/Plan:   COPD with chronic bronchitis. - she has a persistent exacerbation with productive cough - will give course of doxycycline and check CXR - don't think she needs prednisone at time - continue trelegy, singulair and prn proair - advised her to resume using mucinex when she has a productive cough  Obstructive sleep apnea. - she is compliant with CPAP  and reports benefit  - her machine is more than 69 yrs old, not functioning appropriately, and not amenable to repair - will arrange for new CPAP machine at 9 cm H2O - advised she might need repeat home sleep study prior to insurance approval of new CPAP machine  Obesity. - discussed importance of weight loss  Rheumatoid arthritis. - followed by Dr. Amil Amen with rheumatology - on MTX, remicade  A total of  33 minutes spent addressing patient care issues on day of visit.  Follow up:   Patient Instructions  Chest xray today  Doxycycline 100 mg pill twice per day for 7 days  Will arrange for new CPAP machine through [facility]  Follow up in 2 months   Signature:  [name], MD [place] Pager: [phone] 08/16/2019, 12:45 PM  Flow Sheet     Pulmonary tests:  PFT 09/18/07 >> FEV1 1.38 (61%), FEV1% 59, TLC 4.48 (95%), DLCO 80%, + BD PFT 08/09/17 >> FEV1 1.35 (61%), FEV1% 69, TLC 4.67 (98%), DLCO 97%  Chest imaging:  HRCT chest 08/17/17 >> calcification RV, atherosclerosis, scarring, mild air trapping, micronodularity LLL, 1.4 cm nodule LUL, fatty liver CT chest 11/29/17 >> LUL nodule resolved  Sleep tests:  PSG 09/02/07 >> AHI 9 CPAP 07/16/19 to 08/14/19 >> used on 30  of 30 nights with average 8 hrs 28 min.  Average AHI 1.6 with CPAP 9 cm H2O.  Cardiac tests:  Echo 09/01/17 >> EF 65 to 70%, mild AS, severe MV calcification  Medications:   Allergies as of 08/16/2019      Reactions   Demerol Nausea And Vomiting   Severe   Hydromorphone Hcl Itching   All over the body.   Tetracycline Hives      Medication List       Accurate as of Aug 16, 2019 12:45 PM. If you have any questions, ask your nurse or doctor.        STOP taking these medications   ibuprofen 200 MG tablet Commonly known as: ADVIL Stopped by: [name], MD     TAKE these medications   acetaminophen 325 MG tablet Commonly known as: TYLENOL Take 650 mg by  mouth every 6 (six) hours as needed (for pain.).   albuterol (2.5 MG/3ML) 0.083% nebulizer solution Commonly known as: PROVENTIL Take 3 mLs (2.5 mg total) by nebulization every 6 (six) hours as needed for wheezing or shortness of breath (dx: J43.9).   albuterol 108 (90 Base) MCG/ACT inhaler Commonly known as: ProAir HFA INHALE 2 PUFFS INTO THE LUNGS EVERY 6 HOURS AS NEEDED FOR WHEEZING OR SHORTNESS OF BREATH   ALPRAZolam 0.5 MG tablet Commonly known as: XANAX Take 0.5 mg by mouth 3 (three) times daily as needed for sleep or anxiety.   Benicar 20 MG tablet Generic drug: olmesartan Take 20 mg by mouth daily.   Bydureon 2 MG Pen Generic drug: Exenatide ER Inject 2 mg into the muscle every Saturday.   calamine lotion Apply 1 application topically as needed (bug bites).   diclofenac sodium 1 % Gel Commonly known as: VOLTAREN Apply 2-4 g topically 4 (four) times daily as needed for pain.   doxycycline 100 MG tablet Commonly known as: VIBRA-TABS Take 1 tablet (100 mg total) by mouth 2 (two) times daily. Started by: [name], MD   empagliflozin 25 MG Tabs tablet Commonly known as: JARDIANCE Take 25 mg by mouth daily.   furosemide 40 MG tablet Commonly known as: LASIX Take 40 mg by mouth daily as needed for fluid. Fluid   gabapentin 600 MG tablet Commonly known as: NEURONTIN Take 600 mg by mouth at bedtime.   HumaLOG KwikPen 100 UNIT/ML KwikPen Generic drug: insulin lispro Inject 8-14 Units into the skin See admin instructions. Inject 8 units subcutaneously in the morning & inject 14 units subcutaneously at supper   hydrOXYzine 25 MG tablet Commonly known as: ATARAX/VISTARIL Take 25 mg by mouth 3 (three) times daily as needed (Neuropathic itch).   Lantus SoloStar 100 UNIT/ML Solostar Pen Generic drug: insulin glargine Inject 70 Units into the skin 2 (two) times a day.   loratadine 10 MG tablet Commonly known as: CLARITIN Take 10 mg by mouth daily.   Lubricant  Eye Drops 0.5 % Soln Generic drug: carboxymethylcellulose 1 drop 3 (three) times daily as needed.   metFORMIN 500 MG 24 hr tablet Commonly known as: GLUCOPHAGE-XR Take 2,000 mg by mouth daily.   methotrexate 2.5 MG tablet Commonly known as: RHEUMATREX Take 2.5 mg by mouth once a week. Caution:Chemotherapy. Protect from light.   montelukast 10 MG tablet Commonly known as: SINGULAIR TAKE 1 TABLET AT BEDTIME   omeprazole 40 MG capsule Commonly known as: PRILOSEC TAKE 1 CAPSULE EVERY MORNING What changed: when to take this   propylthiouracil 50 MG tablet Commonly known as:  PTU Take 1 tablet (50 mg total) by mouth 2 (two) times daily.   REMICADE IV Inject 1 Dose into the vein every 6 (six) weeks.   sertraline 50 MG tablet Commonly known as: ZOLOFT Take 50 mg by mouth daily.   simvastatin 40 MG tablet Commonly known as: ZOCOR Take 40 mg by mouth daily with supper.   Trelegy Ellipta 100-62.5-25 MCG/INH Aepb Generic drug: Fluticasone-Umeclidin-Vilant USE 1 INHALATION DAILY What changed: See the new instructions.       Past Surgical History:  She  has a past surgical history that includes Total knee arthroplasty (05/07/2010); Revision total knee arthroplasty (06/09/2010); Appendectomy (1967); Abdominal hysterectomy (1992); Cholecystectomy (1992); [place]-pulmonary window repair (1992); Hernia repair (2001); Hernia repair (2007); Abdominal wall mesh  removal (08/13/2009); Foreign body removal abdominal (01/04/2010); Colonoscopy (12/08/2011); Bone biopsy (Right, 04/19/2016); Colonoscopy with propofol (N/A, 10/16/2017); polypectomy (10/16/2017); Esophagogastroduodenoscopy (egd) with propofol (N/A, 10/29/2018); Colonoscopy with propofol (N/A, 10/29/2018); polypectomy (10/29/2018); and biopsy (10/29/2018).  Family History:  Her family history includes Arthritis in her father; COPD in her father; Cancer in her mother; Diabetes in her father and mother; Hyperlipidemia in her brother and mother;  Hypertension in her brother; Thyroid disease in her mother.  Social History:  She  reports that she quit smoking about 27 years ago. Her smoking use included cigarettes. She has a 50.00 pack-year smoking history. She has never used smokeless tobacco. She reports that she does not drink alcohol or use drugs.

## 2019-08-16 NOTE — Patient Instructions (Signed)
Chest xray today  Doxycycline 100 mg pill twice per day for 7 days  Will arrange for new CPAP machine through [facility]  Follow up in 2 months

## 2019-08-19 ENCOUNTER — Telehealth: Payer: Self-pay | Admitting: Pulmonary Disease

## 2019-08-19 DIAGNOSIS — J849 Interstitial pulmonary disease, unspecified: Secondary | ICD-10-CM

## 2019-08-19 NOTE — Telephone Encounter (Signed)
DG Chest 2 View  Result Date: 08/16/2019 CLINICAL DATA:  Chronic cough EXAM: CHEST - 2 VIEW COMPARISON:  07/29/2017 FINDINGS: Frontal and lateral views of the chest demonstrate an unremarkable cardiac silhouette. Diffuse interstitial prominence consistent with background scarring, increased since prior study. No airspace disease, effusion, or pneumothorax. No acute bony abnormalities. IMPRESSION: 1. Increased interstitial prominence consistent with scarring. No acute airspace disease. Electronically Signed   By: Randa Ngo M.D.   On: 08/16/2019 20:44    Please let her know that her CXR shows increased lung scarring compared to previous CXRs.  This can be seen sometimes when rheumatoid arthritis is affecting her lungs.  In order to assess this further she will need a CT chest.  If she is agreeable to proceed with this, then please schedule a high resolution CT chest to further assess for interstitial lung disease.

## 2019-08-21 ENCOUNTER — Other Ambulatory Visit: Payer: Self-pay

## 2019-08-22 ENCOUNTER — Encounter: Payer: Self-pay | Admitting: Endocrinology

## 2019-08-22 ENCOUNTER — Ambulatory Visit (INDEPENDENT_AMBULATORY_CARE_PROVIDER_SITE_OTHER): Payer: Medicare Other | Admitting: Endocrinology

## 2019-08-22 VITALS — BP 114/70 | HR 86 | Ht 62.0 in | Wt 266.4 lb

## 2019-08-22 DIAGNOSIS — E0590 Thyrotoxicosis, unspecified without thyrotoxic crisis or storm: Secondary | ICD-10-CM

## 2019-08-22 NOTE — Addendum Note (Signed)
Addended by: Kaylyn Lim I on: 08/22/2019 03:44 PM   Modules accepted: Orders

## 2019-08-22 NOTE — Progress Notes (Signed)
Patient ID: [name], female   DOB: [birth year], 69 y.o.   MRN: [id number]                                                                                                              Reason for Appointment: Endocrinology follow-up    Chief complaint: Follow-up   History of Present Illness:   She had a low level of TSH done during her hospitalization for COPD in April 2019 Also at that time she was tachycardic, was apparently fairly ill at that time with her COPD exacerbation  Evaluation did not show any evidence of toxic nodular goiter, hot nodule or Graves' disease With a trial of PTU 50 mg twice daily she subjectively did not feel any different and follow-up free T4 was below normal at 0.68 Also then her TSH was not significantly suppressed She was told to stop PTU   Since TSH was still relatively low in April 2020 she has been back in follow-up regularly now  TSH was undetectable in 11/2018 and 11/20 free T3 was upper normal at 4.0 At that time she had no symptoms of unusual weight loss, palpitations, unusual fatigue or heat intolerance She has no shakiness of her hands  Previously did not have a goiter on exam  She was started back on PTU 50 mg daily in 02/2019 However since her TSH was still low at 0.015 she was told to go up to twice a day on her follow-up  On her last visit in 3/21 although she was feeling fairly good she had slightly low free T4 along with normal TSH and PTU was reduced to 1 tablet daily  Recently did not have any fatigue or shakiness Her weight fluctuates based on her compliance with diet  Her labs now show TSH to be low normal at 0.35 compared to 0.69 Again her free T4 is slightly below normal, free T3 is about the same at 2.6   Her thyroid scan in 10/2017 did not show any overactive areas and uptake of 19%  Wt Readings from Last 3 Encounters:  08/22/19 266 lb 6.4 oz (120.8 kg)  08/16/19 264 lb 9.6 oz (120 kg)  02/26/19 261 lb (118.4 kg)       Thyroid function tests as follows:     Lab Results  Component Value Date   FREET4 0.80 (L) 08/15/2019   FREET4 0.78 (L) 06/13/2019   FREET4 1.05 04/17/2019   T3FREE 2.6 08/15/2019   T3FREE 2.7 06/13/2019   T3FREE 3.1 04/17/2019   TSH 0.349 (L) 08/15/2019   TSH 0.603 06/13/2019   TSH 0.60 06/13/2019    Lab Results  Component Value Date   THYROTRECAB <1.10 02/12/2019   THYROTRECAB 0.55 10/18/2017     Allergies as of 08/22/2019      Reactions   Demerol Nausea And Vomiting   Severe   Hydromorphone Hcl Itching   All over the body.   Tetracycline Hives      Medication List       Accurate  as of Aug 22, 2019  3:22 PM. If you have any questions, ask your nurse or doctor.        STOP taking these medications   Bydureon 2 MG Pen Generic drug: Exenatide ER Stopped by: Elayne Snare, MD     TAKE these medications   acetaminophen 325 MG tablet Commonly known as: TYLENOL Take 650 mg by mouth every 6 (six) hours as needed (for pain.).   albuterol (2.5 MG/3ML) 0.083% nebulizer solution Commonly known as: PROVENTIL Take 3 mLs (2.5 mg total) by nebulization every 6 (six) hours as needed for wheezing or shortness of breath (dx: J43.9).   albuterol 108 (90 Base) MCG/ACT inhaler Commonly known as: ProAir HFA INHALE 2 PUFFS INTO THE LUNGS EVERY 6 HOURS AS NEEDED FOR WHEEZING OR SHORTNESS OF BREATH   ALPRAZolam 0.5 MG tablet Commonly known as: XANAX Take 0.5 mg by mouth 3 (three) times daily as needed for sleep or anxiety.   Benicar 20 MG tablet Generic drug: olmesartan Take 20 mg by mouth daily.   calamine lotion Apply 1 application topically as needed (bug bites).   diclofenac sodium 1 % Gel Commonly known as: VOLTAREN Apply 2-4 g topically 4 (four) times daily as needed for pain.   doxycycline 100 MG tablet Commonly known as: VIBRA-TABS Take 1 tablet (100 mg total) by mouth 2 (two) times daily.   empagliflozin 25 MG Tabs tablet Commonly known as:  JARDIANCE Take 25 mg by mouth daily.   furosemide 40 MG tablet Commonly known as: LASIX Take 40 mg by mouth daily as needed for fluid. Fluid   gabapentin 600 MG tablet Commonly known as: NEURONTIN Take 600 mg by mouth at bedtime.   HumaLOG KwikPen 100 UNIT/ML KwikPen Generic drug: insulin lispro Inject 8-14 Units into the skin See admin instructions. Inject 8 units subcutaneously in the morning & inject 14 units subcutaneously at supper   hydrOXYzine 25 MG tablet Commonly known as: ATARAX/VISTARIL Take 25 mg by mouth 3 (three) times daily as needed (Neuropathic itch).   Lantus SoloStar 100 UNIT/ML Solostar Pen Generic drug: insulin glargine Inject into the skin 2 (two) times a day. Inject 50 units under the skin in the morning and 60 units in the evening.   loratadine 10 MG tablet Commonly known as: CLARITIN Take 10 mg by mouth daily.   Lubricant Eye Drops 0.5 % Soln Generic drug: carboxymethylcellulose 1 drop 3 (three) times daily as needed.   metFORMIN 500 MG 24 hr tablet Commonly known as: GLUCOPHAGE-XR Take 2,000 mg by mouth daily.   methotrexate 2.5 MG tablet Commonly known as: RHEUMATREX Take 2.5 mg by mouth once a week. Caution:Chemotherapy. Protect from light.   montelukast 10 MG tablet Commonly known as: SINGULAIR TAKE 1 TABLET AT BEDTIME   omeprazole 40 MG capsule Commonly known as: PRILOSEC TAKE 1 CAPSULE EVERY MORNING What changed: when to take this   propylthiouracil 50 MG tablet Commonly known as: PTU Take 1 tablet (50 mg total) by mouth 2 (two) times daily. What changed: when to take this   REMICADE IV Inject 1 Dose into the vein every 6 (six) weeks.   sertraline 50 MG tablet Commonly known as: ZOLOFT Take 50 mg by mouth daily.   simvastatin 40 MG tablet Commonly known as: ZOCOR Take 40 mg by mouth daily with supper.   Trelegy Ellipta 100-62.5-25 MCG/INH Aepb Generic drug: Fluticasone-Umeclidin-Vilant USE 1 INHALATION DAILY What  changed: See the new instructions.  Past Medical History:  Diagnosis Date  . Asthma   . COPD (chronic obstructive pulmonary disease) ([place])   . Depression   . DM type 1 (diabetes mellitus, type 1) ([place])   . GERD (gastroesophageal reflux disease)   . Hyperlipidemia   . Hypertension   . Morbid obesity ([place])   . Obstructive sleep apnea syndrome   . Osteoarthritis of right knee 04/2010   end-stage  . PONV (postoperative nausea and vomiting)   . Rheumatoid arthritis(714.0)    on methotrexate therapy  . Septic arthritis of knee, right ([place]) 04/2010    Past Surgical History:  Procedure Laterality Date  . ABDOMINAL HYSTERECTOMY  1992  . ABDOMINAL WALL MESH  REMOVAL  08/13/2009   with removal of retained stitches  . [place]   for cardiac tamponade  . APPENDECTOMY  1967  . BIOPSY  10/29/2018   Procedure: BIOPSY;  Surgeon: Doran Stabler, MD;  Location: [facility];  Service: Gastroenterology;;  . BONE BIOPSY Right 04/19/2016   Procedure: BONE BIOPSY RIGHT GREAT TOE;  Surgeon: Caprice Beaver, DPM;  Location: [facility];  Service: Podiatry;  Laterality: Right;  . CHOLECYSTECTOMY  1992   laparoscopic  . COLONOSCOPY  12/08/2011   Procedure: COLONOSCOPY;  Surgeon: Rogene Houston, MD;  [facility]: [facility];  Service: Endoscopy;  Laterality: N/A;  830  . COLONOSCOPY WITH PROPOFOL N/A 10/16/2017   Procedure: COLONOSCOPY WITH PROPOFOL;  Surgeon: Doran Stabler, MD;  Location: [facility];  Service: Gastroenterology;  Laterality: N/A;  . COLONOSCOPY WITH PROPOFOL N/A 10/29/2018   Procedure: COLONOSCOPY WITH PROPOFOL;  Surgeon: Doran Stabler, MD;  Location: [facility];  Service: Gastroenterology;  Laterality: N/A;  . ESOPHAGOGASTRODUODENOSCOPY (EGD) WITH PROPOFOL N/A 10/29/2018   Procedure: ESOPHAGOGASTRODUODENOSCOPY (EGD) WITH PROPOFOL;  Surgeon: Doran Stabler, MD;  [facility];  Service: Gastroenterology;  Laterality: N/A;  .  FOREIGN BODY REMOVAL ABDOMINAL  01/04/2010   stitch abscesses  . HERNIA REPAIR  2001   open LOA / [place] repair w mesh.  Dr. Arnoldo Morale  . HERNIA REPAIR  2007   open redo LOA / [place] repair w mesh.  Dr. Arnoldo Morale  . POLYPECTOMY  10/16/2017   Procedure: POLYPECTOMY;  Surgeon: Doran Stabler, MD;  Location: [facility];  Service: Gastroenterology;;  . POLYPECTOMY  10/29/2018   Procedure: POLYPECTOMY;  Surgeon: Doran Stabler, MD;  Location: [facility];  Service: Gastroenterology;;  . REVISION TOTAL KNEE ARTHROPLASTY  06/09/2010   I and D   . TOTAL KNEE ARTHROPLASTY  05/07/2010   right knee    Family History  Problem Relation Age of Onset  . Cancer Mother   . Hyperlipidemia Mother   . Diabetes Mother   . Thyroid disease Mother   . Diabetes Father   . COPD Father   . Arthritis Father        RA  . Hyperlipidemia Brother   . Hypertension Brother     Social History:  reports that she quit smoking about 27 years ago. Her smoking use included cigarettes. She has a 50.00 pack-year smoking history. She has never used smokeless tobacco. She reports that she does not drink alcohol or use drugs.  Allergies:  Allergies  Allergen Reactions  . Demerol Nausea And Vomiting    Severe  . Hydromorphone Hcl Itching    All over the body.  . Tetracycline Hives     Review of Systems  She has  been on insulin for her diabetes since 1992 Also on Jardiance, Bydureon and metformin her medications are being prescribed by her PCP  Most recent A1c was 5.9   done in 01/2019, she does have issues with anemia  She still watches her diet well She has cut down her morning insulin to 50 units now because of hypoglycemia No further hypoglycemia   Lab Results  Component Value Date   HGBA1C 6.6 (A) 12/22/2017   HGBA1C 7.8 (H) 09/16/2016   HGBA1C (H) 06/10/2010    7.0 (NOTE)                                                                       According to the ADA Clinical Practice Recommendations  for 2011, when HbA1c is used as a screening test:   >=6.5%   Diagnostic of Diabetes Mellitus           (if abnormal result  is confirmed)  5.7-6.4%   Increased risk of developing Diabetes Mellitus  References:Diagnosis and Classification of Diabetes Mellitus,Diabetes [id number] 1):S62-S69 and Standards of Medical Care in         Diabetes - 2011,Diabetes Care,2011,34  (Suppl 1):S11-S61.   Lab Results  Component Value Date   CREATININE 0.92 07/30/2017     HYPERTENSION: She is taking Benicar 20 mg daily followed by PCP    Examination:   BP 114/70 (BP Location: Left Arm, Patient Position: Sitting, Cuff Size: Large)   Pulse 86   Ht 5\' 2"  (1.575 m)   Wt 266 lb 6.4 oz (120.8 kg)   SpO2 94%   BMI 48.73 kg/m   Thyroid not palpable Biceps reflexes normal    Assessment/Plan:  Subclinical Hyperthyroidism  She has had a persistently suppressed TSH without symptomatic or overt hyperthyroidism Also at baseline her T3 level was upper normal without any symptoms Initially evaluated in 2019 She has no history of goiter and previous thyroid scan was normal No evidence of toxic nodular goiter, nodules or Graves' disease on previous studies  With continuing low-dose PTU her free T4 is now slightly low and free T3 has come down significantly TSH is 0.35  Since her T3 level has not gone up with reducing her PTU she will try stopping this completely Will go another 2 months to recheck     Elayne Snare 08/22/2019, 3:22 PM       Note: This office note was prepared with Dragon voice recognition system technology. Any transcriptional errors that result from this process are unintentional.

## 2019-08-23 NOTE — Addendum Note (Signed)
Addended by: Elayne Snare on: 08/23/2019 11:37 AM   Modules accepted: Orders

## 2019-08-26 NOTE — Telephone Encounter (Signed)
Spoke with patient. She is aware of results and verbalized understanding. She wishes to proceed forward with the CT scan.   Since patient lives in [place], will try to get her scheduled at [facility]. She is also interested in following up with Dr. Halford Chessman in [place].   Patient is aware that the [facility] will call her to get the CT scheduled.   Nothing further needed at time of call.

## 2019-08-29 ENCOUNTER — Other Ambulatory Visit: Payer: Self-pay | Admitting: Pulmonary Disease

## 2019-09-10 ENCOUNTER — Ambulatory Visit (HOSPITAL_COMMUNITY)
Admission: RE | Admit: 2019-09-10 | Discharge: 2019-09-10 | Disposition: A | Discharge status: Home / Self Care | Payer: Medicare Other | Source: Ambulatory Visit | Attending: Pulmonary Disease | Admitting: Pulmonary Disease

## 2019-09-10 ENCOUNTER — Other Ambulatory Visit: Payer: Self-pay

## 2019-09-10 DIAGNOSIS — J849 Interstitial pulmonary disease, unspecified: Secondary | ICD-10-CM | POA: Diagnosis not present

## 2019-09-10 DIAGNOSIS — R911 Solitary pulmonary nodule: Secondary | ICD-10-CM | POA: Diagnosis not present

## 2019-09-11 DIAGNOSIS — Z79899 Other long term (current) drug therapy: Secondary | ICD-10-CM | POA: Diagnosis not present

## 2019-09-11 DIAGNOSIS — M0589 Other rheumatoid arthritis with rheumatoid factor of multiple sites: Secondary | ICD-10-CM | POA: Diagnosis not present

## 2019-09-18 ENCOUNTER — Encounter: Payer: Self-pay | Admitting: Pulmonary Disease

## 2019-09-18 ENCOUNTER — Ambulatory Visit (INDEPENDENT_AMBULATORY_CARE_PROVIDER_SITE_OTHER): Payer: Medicare Other | Admitting: Pulmonary Disease

## 2019-09-18 ENCOUNTER — Other Ambulatory Visit: Payer: Self-pay

## 2019-09-18 VITALS — BP 122/60 | HR 86 | Temp 97.0°F | Ht 62.0 in | Wt 261.5 lb

## 2019-09-18 DIAGNOSIS — J449 Chronic obstructive pulmonary disease, unspecified: Secondary | ICD-10-CM | POA: Diagnosis not present

## 2019-09-18 DIAGNOSIS — Z8739 Personal history of other diseases of the musculoskeletal system and connective tissue: Secondary | ICD-10-CM | POA: Diagnosis not present

## 2019-09-18 DIAGNOSIS — R911 Solitary pulmonary nodule: Secondary | ICD-10-CM

## 2019-09-18 DIAGNOSIS — G4733 Obstructive sleep apnea (adult) (pediatric): Secondary | ICD-10-CM | POA: Diagnosis not present

## 2019-09-18 MED ORDER — PREDNISONE 10 MG PO TABS
ORAL_TABLET | ORAL | 0 refills | Status: AC
Start: 2019-09-18 — End: 2019-09-24

## 2019-09-18 NOTE — Patient Instructions (Signed)
Prednisone 10 mg pill >> 3 pills daily for 2 days, 2 pills daily for 2 days, 1 pill daily for 2 days  Will arrange for sputum culture  Follow up in 3 months

## 2019-09-18 NOTE — Progress Notes (Signed)
[name]  Chief Complaint  Patient presents with  . Follow-up    Cough is unchanged since the last visit. She is using her proair about once per day. Doing well with CPAP.     Constitutional:  BP 122/60 (BP Location: Left Arm, Cuff Size: Large)   Pulse 86   Temp (!) 97 F (36.1 C) (Temporal)   Ht 5\' 2"  (1.575 m)   Wt 261 lb 8 oz (118.6 kg)   SpO2 95% Comment: on RA  BMI 47.83 kg/m   Past Medical History:  RA, HTN, HLD, GERD, DM, Depression  Brief Summary:  [name] is a 69 y.o. female former smoker with obstructive sleep apnea and COPD.  Subjective:  She continues to have productive cough.  Bringing up clear, white, thick sputum.  Also getting wheeze.  Sore in mid chest from coughing.  No fever, or hemoptysis.  Sinus and throat okay.  Using CPAP nightly w/o issue.  She uses [facility] on [facility] in [place], [place] for her DME.  HRCT chest from earlier this month showed minimal basilar scarring and increase size of Lt upper lobe area of ground glass opacification.  Both of these are nonspecific but will need monitoring.  Physical Exam:   Appearance - well kempt   ENMT - no sinus tenderness, no oral exudate, no LAN, Mallampati 3 airway, no stridor  Respiratory - b/l wheezing, b/l rhonchi that partially clears with coughing  CV - s1s2 regular rate and rhythm, no murmurs  Ext - no clubbing, no edema  Skin - no rashes  Psych - normal mood and affect  Assessment/Plan:   COPD with chronic bronchitis. - will give course of prednisone - defer additional antibiotics for now - will arrange for sputum gram stain and culture - continue trelegy, singulair, and proair - continue mucinex  Obstructive sleep apnea. - she is compliant with CPAP and reports benefit - continue CPAP 9 cm H2O  Lt upper lobe area of ground glass opacification. - she will need f/u CT chest without contrast in June  2022  Obesity. - discussed importance of weight loss  Rheumatoid arthritis. - followed by Dr. Amil Amen with rheumatology - on MTX, remicade - she has very mild subpleural scarring on CT chest from June 2021; I don't think this is of clinical significance at present but will monitor with follow up CT chest  A total of  34 minutes spent addressing patient care issues on day of visit.   Follow up:   Patient Instructions  Prednisone 10 mg pill >> 3 pills daily for 2 days, 2 pills daily for 2 days, 1 pill daily for 2 days  Will arrange for sputum culture  Follow up in 3 months   Signature:  [name], MD [place] Pager: [phone] 09/18/2019, 12:22 PM  Flow Sheet     Pulmonary tests:   PFT 09/18/07 >> FEV1 1.38 (61%), FEV1% 59, TLC 4.48 (95%), DLCO 80%, + BD  PFT 08/09/17 >> FEV1 1.35 (61%), FEV1% 69, TLC 4.67 (98%), DLCO 97%  Chest imaging:   HRCT chest 08/17/17 >> calcification RV, atherosclerosis, scarring, mild air trapping, micronodularity LLL, 1.4 cm nodule LUL, fatty liver  CT chest 11/29/17 >> LUL nodule resolved  HRCT chest 09/11/19 >> mild basilar subpleural scarring, 1.4 x 1.1 area of GGO LUL  Sleep tests:   PSG 09/02/07 >> AHI 9  CPAP 07/16/19 to 08/14/19 >> used on 30  of 30 nights with average 8 hrs 28 min.  Average AHI 1.6 with CPAP 9 cm H2O.  Cardiac tests:   Echo 09/01/17 >> EF 65 to 70%, mild AS, severe MV calcification  Medications:   Allergies as of 09/18/2019      Reactions   Demerol Nausea And Vomiting   Severe   Hydromorphone Hcl Itching   All over the body.   Tetracycline Hives      Medication List       Accurate as of September 18, 2019 12:22 PM. If you have any questions, ask your nurse or doctor.        STOP taking these medications   doxycycline 100 MG tablet Commonly known as: VIBRA-TABS Stopped by: [name], MD   propylthiouracil 50 MG tablet Commonly known as: PTU Stopped by: [name], MD      TAKE these medications   acetaminophen 325 MG tablet Commonly known as: TYLENOL Take 650 mg by mouth every 6 (six) hours as needed (for pain.).   albuterol (2.5 MG/3ML) 0.083% nebulizer solution Commonly known as: PROVENTIL Take 3 mLs (2.5 mg total) by nebulization every 6 (six) hours as needed for wheezing or shortness of breath (dx: J43.9).   albuterol 108 (90 Base) MCG/ACT inhaler Commonly known as: ProAir HFA INHALE 2 PUFFS INTO THE LUNGS EVERY 6 HOURS AS NEEDED FOR WHEEZING OR SHORTNESS OF BREATH   ALPRAZolam 0.5 MG tablet Commonly known as: XANAX Take 0.5 mg by mouth 3 (three) times daily as needed for sleep or anxiety.   Benicar 20 MG tablet Generic drug: olmesartan Take 20 mg by mouth daily.   calamine lotion Apply 1 application topically as needed (bug bites).   diclofenac sodium 1 % Gel Commonly known as: VOLTAREN Apply 2-4 g topically 4 (four) times daily as needed for pain.   empagliflozin 25 MG Tabs tablet Commonly known as: JARDIANCE Take 25 mg by mouth daily.   furosemide 40 MG tablet Commonly known as: LASIX Take 40 mg by mouth daily as needed for fluid. Fluid   gabapentin 600 MG tablet Commonly known as: NEURONTIN Take 600 mg by mouth at bedtime.   HumaLOG KwikPen 100 UNIT/ML KwikPen Generic drug: insulin lispro Inject 8-14 Units into the skin See admin instructions. Inject 8 units subcutaneously in the morning & inject 14 units subcutaneously at supper   hydrOXYzine 25 MG tablet Commonly known as: ATARAX/VISTARIL Take 25 mg by mouth 3 (three) times daily as needed (Neuropathic itch).   Lantus SoloStar 100 UNIT/ML Solostar Pen Generic drug: insulin glargine Inject into the skin 2 (two) times a day. Inject 50 units under the skin in the morning and 60 units in the evening.   loratadine 10 MG tablet Commonly known as: CLARITIN Take 10 mg by mouth daily.   Lubricant Eye Drops 0.5 % Soln Generic drug: carboxymethylcellulose 1 drop 3 (three)  times daily as needed.   metFORMIN 500 MG 24 hr tablet Commonly known as: GLUCOPHAGE-XR Take 2,000 mg by mouth daily.   methotrexate 2.5 MG tablet Commonly known as: RHEUMATREX Take 2.5 mg by mouth once a week. Caution:Chemotherapy. Protect from light.   montelukast 10 MG tablet Commonly known as: SINGULAIR TAKE 1 TABLET AT BEDTIME   omeprazole 40 MG capsule Commonly known as: PRILOSEC TAKE 1 CAPSULE EVERY MORNING What changed: when to take this   predniSONE 10 MG tablet Commonly known as: DELTASONE Take 3 tablets (30 mg total) by mouth daily with breakfast for 2 days, THEN  2 tablets (20 mg total) daily with breakfast for 2 days, THEN 1 tablet (10 mg total) daily with breakfast for 2 days. Start taking on: September 18, 2019 Started by: [name], MD   REMICADE IV Inject 1 Dose into the vein every 6 (six) weeks.   sertraline 50 MG tablet Commonly known as: ZOLOFT Take 50 mg by mouth daily.   simvastatin 40 MG tablet Commonly known as: ZOCOR Take 40 mg by mouth daily with supper.   Trelegy Ellipta 100-62.5-25 MCG/INH Aepb Generic drug: Fluticasone-Umeclidin-Vilant Inhale 1 puff into the lungs daily.       Past Surgical History:  She  has a past surgical history that includes Total knee arthroplasty (05/07/2010); Revision total knee arthroplasty (06/09/2010); Appendectomy (1967); Abdominal hysterectomy (1992); Cholecystectomy (1992); Aorto-pulmonary window repair (1992); Hernia repair (2001); Hernia repair (2007); Abdominal wall mesh  removal (08/13/2009); Foreign body removal abdominal (01/04/2010); Colonoscopy (12/08/2011); Bone biopsy (Right, 04/19/2016); Colonoscopy with propofol (N/A, 10/16/2017); polypectomy (10/16/2017); Esophagogastroduodenoscopy (egd) with propofol (N/A, 10/29/2018); Colonoscopy with propofol (N/A, 10/29/2018); polypectomy (10/29/2018); and biopsy (10/29/2018).  Family History:  Her family history includes Arthritis in her father; COPD in her father; Cancer in her  mother; Diabetes in her father and mother; Hyperlipidemia in her brother and mother; Hypertension in her brother; Thyroid disease in her mother.  Social History:  She  reports that she quit smoking about 27 years ago. Her smoking use included cigarettes. She has a 50.00 pack-year smoking history. She has never used smokeless tobacco. She reports that she does not drink alcohol or use drugs.

## 2019-09-20 ENCOUNTER — Other Ambulatory Visit (HOSPITAL_COMMUNITY)
Admission: RE | Admit: 2019-09-20 | Discharge: 2019-09-20 | Disposition: A | Discharge status: Home / Self Care | Payer: Medicare Other | Source: Ambulatory Visit | Attending: Pulmonary Disease | Admitting: Pulmonary Disease

## 2019-09-20 DIAGNOSIS — J449 Chronic obstructive pulmonary disease, unspecified: Secondary | ICD-10-CM | POA: Diagnosis not present

## 2019-09-20 LAB — EXPECTORATED SPUTUM ASSESSMENT W GRAM STAIN, RFLX TO RESP C

## 2019-09-22 LAB — CULTURE, RESPIRATORY W GRAM STAIN: Culture: NORMAL

## 2019-09-23 ENCOUNTER — Telehealth: Payer: Self-pay | Admitting: Pulmonary Disease

## 2019-09-23 NOTE — Telephone Encounter (Signed)
Recent Results (from the past 240 hour(s))  Expectorated sputum assessment w rflx to resp cult     Status: None   Collection Time: 09/20/19  1:29 PM   Specimen: Sputum  Result Value Ref Range Status   Specimen Description SPUTUM  Final   Special Requests NONE  Final   Sputum evaluation   Final    THIS SPECIMEN IS ACCEPTABLE FOR SPUTUM CULTURE Performed at [facility], [address], [place], [place] [address]    Report Status 09/20/2019 FINAL  Final  Culture, respiratory     Status: None   Collection Time: 09/20/19  1:29 PM   Specimen: SPU  Result Value Ref Range Status   Specimen Description   Final    SPUTUM Performed at [facility], [address] [place], [place] [address]    Special Requests   Final    NONE Reflexed from [id number] Performed at [facility], [address] [place], [place] [address]    Gram Stain   Final    FEW WBC PRESENT, PREDOMINANTLY PMN FEW SQUAMOUS EPITHELIAL CELLS PRESENT ABUNDANT GRAM POSITIVE COCCI    Culture   Final    FEW Consistent with normal respiratory flora. Performed at [facility], [place] [address] [place], [place] [address]    Report Status 09/22/2019 FINAL  Final     Please let her know her sputum culture was negative.  No change to current treatment plan.

## 2019-09-23 NOTE — Telephone Encounter (Signed)
Left message for patient to call back  

## 2019-09-24 DIAGNOSIS — M25561 Pain in right knee: Secondary | ICD-10-CM | POA: Diagnosis not present

## 2019-09-24 DIAGNOSIS — M25562 Pain in left knee: Secondary | ICD-10-CM | POA: Diagnosis not present

## 2019-09-24 NOTE — Telephone Encounter (Signed)
PT called back, please return call

## 2019-09-24 NOTE — Telephone Encounter (Signed)
Spoke with pt, aware of results/recs.  Nothing further needed at this time- will close encounter.   

## 2019-09-24 NOTE — Telephone Encounter (Signed)
Pt calling back.  [phone]

## 2019-10-12 LAB — T4, FREE: Free T4: 1.13 ng/dL (ref 0.82–1.77)

## 2019-10-12 LAB — T3, FREE: T3, Free: 3.2 pg/mL (ref 2.0–4.4)

## 2019-10-12 LAB — TSH: TSH: 0.012 u[IU]/mL — ABNORMAL LOW (ref 0.450–4.500)

## 2019-10-17 ENCOUNTER — Other Ambulatory Visit: Payer: Self-pay

## 2019-10-17 ENCOUNTER — Telehealth (INDEPENDENT_AMBULATORY_CARE_PROVIDER_SITE_OTHER): Payer: Medicare Other | Admitting: Endocrinology

## 2019-10-17 ENCOUNTER — Encounter: Payer: Self-pay | Admitting: Endocrinology

## 2019-10-17 DIAGNOSIS — E0590 Thyrotoxicosis, unspecified without thyrotoxic crisis or storm: Secondary | ICD-10-CM | POA: Diagnosis not present

## 2019-10-17 NOTE — Progress Notes (Signed)
Patient ID: [name], female   DOB: [birth year], 69 y.o.   MRN: [id number]                                                                                                              Reason for Appointment: Endocrinology follow-up  I connected with the above-named patient by video enabled telemedicine application and verified that I am speaking with the correct person. The patient was explained the limitations of evaluation and management by telemedicine and the availability of in person appointments.  Patient also understood that there may be a patient responsible charge related to this service . Location of the patient: Patient's home . Location of the provider: Physician office Only the patient and myself were participating in the encounter The patient understood the above statements and agreed to proceed.   Chief complaint: Follow-up   History of Present Illness:   She had a low level of TSH done during her hospitalization for COPD in April 2019 Also at that time she was tachycardic, was apparently fairly ill at that time with her COPD exacerbation  Evaluation did not show any evidence of toxic nodular goiter, hot nodule or Graves' disease With a trial of PTU 50 mg twice daily she subjectively did not feel any different and follow-up free T4 was below normal at 0.68 Also then her TSH was not significantly suppressed She was told to stop PTU   Since TSH was still relatively low in April 2020 she has been back in follow-up regularly now  TSH was undetectable in 11/2018 and 11/20 free T3 was upper normal at 4.0 At that time she had no symptoms of unusual weight loss, palpitations, unusual fatigue or heat intolerance She has no shakiness of her hands  Previously did not have a goiter on exam  She was started back on PTU 50 mg daily in 02/2019  On her visit in 3/21 although she was feeling fairly good she had slightly low free T4 along with normal TSH and PTU was reduced to 1  tablet daily, subsequently with relatively low free T4 and normal T3 her PTU was stopped in 08/2019  She feels good and does not think she has had any palpitations or rapid heartbeat Her weight fluctuates based on her compliance with diet  Her labs now show TSH to be low at 0.01, previously was at 0.35 However her free T4 is very normal at 1.1 and T3 is 3.2    Her thyroid scan in 10/2017 did not show any overactive areas and uptake of 19%  Wt Readings from Last 3 Encounters:  09/18/19 261 lb 8 oz (118.6 kg)  08/22/19 266 lb 6.4 oz (120.8 kg)  08/16/19 264 lb 9.6 oz (120 kg)      Thyroid function tests as follows:     Lab Results  Component Value Date   FREET4 1.13 10/11/2019   FREET4 0.80 (L) 08/15/2019   FREET4 0.78 (L) 06/13/2019   T3FREE 3.2 10/11/2019   T3FREE 2.6 08/15/2019   T3FREE  2.7 06/13/2019   TSH 0.012 (L) 10/11/2019   TSH 0.349 (L) 08/15/2019   TSH 0.603 06/13/2019    Lab Results  Component Value Date   THYROTRECAB <1.10 02/12/2019   THYROTRECAB 0.55 10/18/2017     Allergies as of 10/17/2019      Reactions   Demerol Nausea And Vomiting   Severe   Hydromorphone Hcl Itching   All over the body.   Tetracycline Hives      Medication List       Accurate as of October 17, 2019 10:35 AM. If you have any questions, ask your nurse or doctor.        acetaminophen 325 MG tablet Commonly known as: TYLENOL Take 650 mg by mouth every 6 (six) hours as needed (for pain.).   albuterol (2.5 MG/3ML) 0.083% nebulizer solution Commonly known as: PROVENTIL Take 3 mLs (2.5 mg total) by nebulization every 6 (six) hours as needed for wheezing or shortness of breath (dx: J43.9).   albuterol 108 (90 Base) MCG/ACT inhaler Commonly known as: ProAir HFA INHALE 2 PUFFS INTO THE LUNGS EVERY 6 HOURS AS NEEDED FOR WHEEZING OR SHORTNESS OF BREATH   ALPRAZolam 0.5 MG tablet Commonly known as: XANAX Take 0.5 mg by mouth 3 (three) times daily as needed for sleep or anxiety.     Benicar 20 MG tablet Generic drug: olmesartan Take 20 mg by mouth daily.   calamine lotion Apply 1 application topically as needed (bug bites).   diclofenac sodium 1 % Gel Commonly known as: VOLTAREN Apply 2-4 g topically 4 (four) times daily as needed for pain.   empagliflozin 25 MG Tabs tablet Commonly known as: JARDIANCE Take 25 mg by mouth daily.   furosemide 40 MG tablet Commonly known as: LASIX Take 40 mg by mouth daily as needed for fluid. Fluid   gabapentin 600 MG tablet Commonly known as: NEURONTIN Take 600 mg by mouth at bedtime.   HumaLOG KwikPen 100 UNIT/ML KwikPen Generic drug: insulin lispro Inject 8-14 Units into the skin See admin instructions. Inject 8 units subcutaneously in the morning & inject 14 units subcutaneously at supper   hydrOXYzine 25 MG tablet Commonly known as: ATARAX/VISTARIL Take 25 mg by mouth 3 (three) times daily as needed (Neuropathic itch).   Lantus SoloStar 100 UNIT/ML Solostar Pen Generic drug: insulin glargine Inject into the skin 2 (two) times a day. Inject 50 units under the skin in the morning and 60 units in the evening.   loratadine 10 MG tablet Commonly known as: CLARITIN Take 10 mg by mouth daily.   Lubricant Eye Drops 0.5 % Soln Generic drug: carboxymethylcellulose 1 drop 3 (three) times daily as needed.   metFORMIN 500 MG 24 hr tablet Commonly known as: GLUCOPHAGE-XR Take 2,000 mg by mouth daily.   methotrexate 2.5 MG tablet Commonly known as: RHEUMATREX Take 2.5 mg by mouth once a week. Caution:Chemotherapy. Protect from light.   montelukast 10 MG tablet Commonly known as: SINGULAIR TAKE 1 TABLET AT BEDTIME   omeprazole 40 MG capsule Commonly known as: PRILOSEC TAKE 1 CAPSULE EVERY MORNING What changed: when to take this   REMICADE IV Inject 1 Dose into the vein every 6 (six) weeks.   sertraline 50 MG tablet Commonly known as: ZOLOFT Take 50 mg by mouth daily.   simvastatin 40 MG tablet Commonly  known as: ZOCOR Take 40 mg by mouth daily with supper.   Trelegy Ellipta 100-62.5-25 MCG/INH Aepb Generic drug: Fluticasone-Umeclidin-Vilant Inhale 1 puff into the lungs  daily.           Past Medical History:  Diagnosis Date  . Asthma   . COPD (chronic obstructive pulmonary disease) ([place])   . Depression   . DM type 1 (diabetes mellitus, type 1) ([place])   . GERD (gastroesophageal reflux disease)   . Hyperlipidemia   . Hypertension   . Morbid obesity ([place])   . Obstructive sleep apnea syndrome   . Osteoarthritis of right knee 04/2010   end-stage  . PONV (postoperative nausea and vomiting)   . Rheumatoid arthritis(714.0)    on methotrexate therapy  . Septic arthritis of knee, right ([place]) 04/2010    Past Surgical History:  Procedure Laterality Date  . ABDOMINAL HYSTERECTOMY  1992  . ABDOMINAL WALL MESH  REMOVAL  08/13/2009   with removal of retained stitches  . [place]   for cardiac tamponade  . APPENDECTOMY  1967  . BIOPSY  10/29/2018   Procedure: BIOPSY;  Surgeon: Doran Stabler, MD;  Location: [facility];  Service: Gastroenterology;;  . BONE BIOPSY Right 04/19/2016   Procedure: BONE BIOPSY RIGHT GREAT TOE;  Surgeon: Caprice Beaver, DPM;  Location: [facility];  Service: Podiatry;  Laterality: Right;  . CHOLECYSTECTOMY  1992   laparoscopic  . COLONOSCOPY  12/08/2011   Procedure: COLONOSCOPY;  Surgeon: Rogene Houston, MD;  [facility];  Service: Endoscopy;  Laterality: N/A;  830  . COLONOSCOPY WITH PROPOFOL N/A 10/16/2017   Procedure: COLONOSCOPY WITH PROPOFOL;  Surgeon: Doran Stabler, MD;  Location: [facility];  Service: Gastroenterology;  Laterality: N/A;  . COLONOSCOPY WITH PROPOFOL N/A 10/29/2018   Procedure: COLONOSCOPY WITH PROPOFOL;  Surgeon: Doran Stabler, MD;  [facility];  Service: Gastroenterology;  Laterality: N/A;  . ESOPHAGOGASTRODUODENOSCOPY (EGD) WITH PROPOFOL N/A 10/29/2018   Procedure:  ESOPHAGOGASTRODUODENOSCOPY (EGD) WITH PROPOFOL;  Surgeon: Doran Stabler, MD;  [facility];  Service: Gastroenterology;  Laterality: N/A;  . FOREIGN BODY REMOVAL ABDOMINAL  01/04/2010   stitch abscesses  . HERNIA REPAIR  2001   open LOA / [place] repair w mesh.  Dr. Arnoldo Morale  . HERNIA REPAIR  2007   open redo LOA / [place] repair w mesh.  Dr. Arnoldo Morale  . POLYPECTOMY  10/16/2017   Procedure: POLYPECTOMY;  Surgeon: Doran Stabler, MD;  Location: [facility];  Service: Gastroenterology;;  . POLYPECTOMY  10/29/2018   Procedure: POLYPECTOMY;  Surgeon: Doran Stabler, MD;  Location: [facility];  Service: Gastroenterology;;  . REVISION TOTAL KNEE ARTHROPLASTY  06/09/2010   I and D   . TOTAL KNEE ARTHROPLASTY  05/07/2010   right knee    Family History  Problem Relation Age of Onset  . Cancer Mother   . Hyperlipidemia Mother   . Diabetes Mother   . Thyroid disease Mother   . Diabetes Father   . COPD Father   . Arthritis Father        RA  . Hyperlipidemia Brother   . Hypertension Brother     Social History:  reports that she quit smoking about 27 years ago. Her smoking use included cigarettes. She has a 50.00 pack-year smoking history. She has never used smokeless tobacco. She reports that she does not drink alcohol and does not use drugs.  Allergies:  Allergies  Allergen Reactions  . Demerol Nausea And Vomiting    Severe  . Hydromorphone Hcl Itching    All over the body.  Marland Kitchen  Tetracycline Hives     Review of Systems  She has been on insulin for her diabetes since 1992 Also on Jardiance, Bydureon and metformin her medications are being prescribed by her PCP No recent labs available   Lab Results  Component Value Date   HGBA1C 6.6 (A) 12/22/2017   HGBA1C 7.8 (H) 09/16/2016   HGBA1C (H) 06/10/2010    7.0 (NOTE)                                                                       According to the ADA Clinical Practice Recommendations for 2011, when HbA1c is  used as a screening test:   >=6.5%   Diagnostic of Diabetes Mellitus           (if abnormal result  is confirmed)  5.7-6.4%   Increased risk of developing Diabetes Mellitus  References:Diagnosis and Classification of Diabetes Mellitus,Diabetes CLEX,5170,01(VCBSW 1):S62-S69 and Standards of Medical Care in         Diabetes - 2011,Diabetes Care,2011,34  (Suppl 1):S11-S61.   Lab Results  Component Value Date   CREATININE 0.92 07/30/2017     HYPERTENSION: She is taking Benicar 20 mg daily followed by PCP Does not monitor at home   Examination:   There were no vitals taken for this visit.    Assessment/Plan:  Subclinical Hyperthyroidism, initially evaluated in 2019  She has had a persistently suppressed TSH without symptomatic or overt hyperthyroidism  Also at baseline her T3 level was upper normal without any symptoms  She has no history of goiter and previous thyroid scan was normal  No evidence of toxic nodular goiter, nodules or Graves' disease on previous studies  She is now back off her PTU since even at low doses of 50 mg daily her T4 and T3 levels were quite normal Currently TSH is suppressed but she is asymptomatic  Since her T3 level is not significantly higher we will continue to watch her without any medication  She will come back in another 2 months to recheck     Elayne Snare 10/17/2019, 10:35 AM       Note: This office note was prepared with Dragon voice recognition system technology. Any transcriptional errors that result from this process are unintentional.

## 2019-10-23 DIAGNOSIS — Z79899 Other long term (current) drug therapy: Secondary | ICD-10-CM | POA: Diagnosis not present

## 2019-10-23 DIAGNOSIS — M0589 Other rheumatoid arthritis with rheumatoid factor of multiple sites: Secondary | ICD-10-CM | POA: Diagnosis not present

## 2019-10-28 DIAGNOSIS — M0589 Other rheumatoid arthritis with rheumatoid factor of multiple sites: Secondary | ICD-10-CM | POA: Diagnosis not present

## 2019-10-28 DIAGNOSIS — Z79899 Other long term (current) drug therapy: Secondary | ICD-10-CM | POA: Diagnosis not present

## 2019-10-28 DIAGNOSIS — L299 Pruritus, unspecified: Secondary | ICD-10-CM | POA: Diagnosis not present

## 2019-10-28 DIAGNOSIS — M150 Primary generalized (osteo)arthritis: Secondary | ICD-10-CM | POA: Diagnosis not present

## 2019-10-28 DIAGNOSIS — Z6841 Body Mass Index (BMI) 40.0 and over, adult: Secondary | ICD-10-CM | POA: Diagnosis not present

## 2019-10-28 DIAGNOSIS — M545 Low back pain: Secondary | ICD-10-CM | POA: Diagnosis not present

## 2019-11-11 DIAGNOSIS — L851 Acquired keratosis [keratoderma] palmaris et plantaris: Secondary | ICD-10-CM | POA: Diagnosis not present

## 2019-11-11 DIAGNOSIS — E1342 Other specified diabetes mellitus with diabetic polyneuropathy: Secondary | ICD-10-CM | POA: Diagnosis not present

## 2019-11-11 DIAGNOSIS — B351 Tinea unguium: Secondary | ICD-10-CM | POA: Diagnosis not present

## 2019-11-12 DIAGNOSIS — E1140 Type 2 diabetes mellitus with diabetic neuropathy, unspecified: Secondary | ICD-10-CM | POA: Diagnosis not present

## 2019-11-16 ENCOUNTER — Other Ambulatory Visit: Payer: Self-pay | Admitting: Pulmonary Disease

## 2019-11-19 ENCOUNTER — Other Ambulatory Visit (HOSPITAL_COMMUNITY): Payer: Self-pay | Admitting: Internal Medicine

## 2019-11-19 DIAGNOSIS — M25551 Pain in right hip: Secondary | ICD-10-CM

## 2019-11-19 DIAGNOSIS — E1140 Type 2 diabetes mellitus with diabetic neuropathy, unspecified: Secondary | ICD-10-CM | POA: Diagnosis not present

## 2019-11-19 DIAGNOSIS — J449 Chronic obstructive pulmonary disease, unspecified: Secondary | ICD-10-CM | POA: Diagnosis not present

## 2019-11-20 ENCOUNTER — Other Ambulatory Visit: Payer: Self-pay

## 2019-11-20 ENCOUNTER — Ambulatory Visit (HOSPITAL_COMMUNITY)
Admission: RE | Admit: 2019-11-20 | Discharge: 2019-11-20 | Disposition: A | Discharge status: Home / Self Care | Payer: Medicare Other | Source: Ambulatory Visit | Attending: Internal Medicine | Admitting: Internal Medicine

## 2019-11-20 DIAGNOSIS — M25551 Pain in right hip: Secondary | ICD-10-CM | POA: Diagnosis not present

## 2019-11-20 DIAGNOSIS — M533 Sacrococcygeal disorders, not elsewhere classified: Secondary | ICD-10-CM | POA: Diagnosis not present

## 2019-11-20 DIAGNOSIS — M1611 Unilateral primary osteoarthritis, right hip: Secondary | ICD-10-CM | POA: Diagnosis not present

## 2019-12-04 DIAGNOSIS — M0589 Other rheumatoid arthritis with rheumatoid factor of multiple sites: Secondary | ICD-10-CM | POA: Diagnosis not present

## 2019-12-06 DIAGNOSIS — M79671 Pain in right foot: Secondary | ICD-10-CM | POA: Diagnosis not present

## 2019-12-06 DIAGNOSIS — M7741 Metatarsalgia, right foot: Secondary | ICD-10-CM | POA: Diagnosis not present

## 2019-12-10 ENCOUNTER — Other Ambulatory Visit: Payer: Self-pay

## 2019-12-10 ENCOUNTER — Other Ambulatory Visit (INDEPENDENT_AMBULATORY_CARE_PROVIDER_SITE_OTHER): Payer: Medicare Other

## 2019-12-10 ENCOUNTER — Ambulatory Visit: Payer: Medicare Other | Attending: Critical Care Medicine

## 2019-12-10 DIAGNOSIS — Z794 Long term (current) use of insulin: Secondary | ICD-10-CM

## 2019-12-10 DIAGNOSIS — E0590 Thyrotoxicosis, unspecified without thyrotoxic crisis or storm: Secondary | ICD-10-CM

## 2019-12-10 DIAGNOSIS — Z23 Encounter for immunization: Secondary | ICD-10-CM

## 2019-12-10 LAB — TSH: TSH: 0.01 u[IU]/mL — ABNORMAL LOW (ref 0.35–4.50)

## 2019-12-10 LAB — GLUCOSE, RANDOM: Glucose, Bld: 135 mg/dL — ABNORMAL HIGH (ref 70–99)

## 2019-12-10 LAB — T4, FREE: Free T4: 0.9 ng/dL (ref 0.60–1.60)

## 2019-12-10 LAB — T3, FREE: T3, Free: 3.1 pg/mL (ref 2.3–4.2)

## 2019-12-10 NOTE — Addendum Note (Signed)
Addended by: Kaylyn Lim I on: 12/10/2019 01:47 PM   Modules accepted: Orders

## 2019-12-10 NOTE — Progress Notes (Signed)
   Covid-19 Vaccination Clinic  Name:  [name]    MRN: [id number] DOB: 1950-04-23  12/10/2019  [name] was observed post Covid-19 immunization for 30 minutes based on pre-vaccination screening without incident. She was provided with Vaccine Information Sheet and instruction to access the V-Safe system.   [name] was instructed to call 911 with any severe reactions post vaccine: Marland Kitchen Difficulty breathing  . Swelling of face and throat  . A fast heartbeat  . A bad rash all over body  . Dizziness and weakness

## 2019-12-12 ENCOUNTER — Other Ambulatory Visit (HOSPITAL_COMMUNITY): Payer: Self-pay | Admitting: Internal Medicine

## 2019-12-12 DIAGNOSIS — Z1231 Encounter for screening mammogram for malignant neoplasm of breast: Secondary | ICD-10-CM

## 2019-12-19 DIAGNOSIS — M545 Low back pain: Secondary | ICD-10-CM | POA: Diagnosis not present

## 2019-12-19 DIAGNOSIS — M25551 Pain in right hip: Secondary | ICD-10-CM | POA: Diagnosis not present

## 2019-12-19 DIAGNOSIS — M5441 Lumbago with sciatica, right side: Secondary | ICD-10-CM | POA: Diagnosis not present

## 2019-12-19 NOTE — Progress Notes (Signed)
Patient ID: [name], female   DOB: 1950-06-26, 69 y.o.   MRN: [id number]                                                                                                              Reason for Appointment: Endocrinology follow-up  I connected with the above-named patient by video enabled telemedicine application and verified that I am speaking with the correct person. The patient was explained the limitations of evaluation and management by telemedicine and the availability of in person appointments.  Patient also understood that there may be a patient responsible charge related to this service  Location of the patient: Patient's home  Location of the provider: Physician office Only the patient and myself were participating in the encounter The patient understood the above statements and agreed to proceed.   Chief complaint: Follow-up   History of Present Illness:   She had a low level of TSH done during her hospitalization for COPD in April 2019 Also at that time she was tachycardic, was apparently fairly ill at that time with her COPD exacerbation  Evaluation did not show any evidence of toxic nodular goiter, hot nodule or Graves' disease With a trial of PTU 50 mg twice daily she subjectively did not feel any different and follow-up free T4 was below normal at 0.68 Also then her TSH was not significantly suppressed and PTU was withheld   Subsequently TSH was still relatively low in April 2020 TSH was undetectable in 11/2018 and 11/20 free T3 was upper normal at 4.0  At that time she had no symptoms of unusual weight loss, palpitations, unusual fatigue or heat intolerance She has no shakiness of her hands  Previously did not have a goiter on exam  She was started back on PTU 50 mg daily in 02/2019  Subsequently because of relatively low free T4, TSH of 0.35 and normal T3 her PTU was stopped in 08/2019  She recently has had no symptoms of shortness of breath, palpitations or  rapid heartbeat although she does not check her pulse or blood pressure at home Her weight is about 3 pounds less than in June  Her labs again show TSH to be low at 0.01  Also her free T4 is normal at 0.9 compared to 1.1 and T3 is 3.1 compared to 3.2  Her thyroid scan in 10/2017 did not show any overactive areas and uptake of 19%  Wt Readings from Last 3 Encounters:  09/18/19 261 lb 8 oz (118.6 kg)  08/22/19 266 lb 6.4 oz (120.8 kg)  08/16/19 264 lb 9.6 oz (120 kg)      Thyroid function tests as follows:     Lab Results  Component Value Date   FREET4 0.90 12/10/2019   FREET4 1.13 10/11/2019   FREET4 0.80 (L) 08/15/2019   T3FREE 3.1 12/10/2019   T3FREE 3.2 10/11/2019   T3FREE 2.6 08/15/2019   TSH 0.01 (L) 12/10/2019   TSH 0.012 (L) 10/11/2019   TSH 0.349 (L) 08/15/2019    Lab  Results  Component Value Date   THYROTRECAB <1.10 02/12/2019   THYROTRECAB 0.55 10/18/2017     Allergies as of 12/20/2019      Reactions   Demerol Nausea And Vomiting   Severe   Hydromorphone Hcl Itching   All over the body.   Tetracycline Hives      Medication List       Accurate as of December 19, 2019  9:13 PM. If you have any questions, ask your nurse or doctor.        acetaminophen 325 MG tablet Commonly known as: TYLENOL Take 650 mg by mouth every 6 (six) hours as needed (for pain.).   albuterol (2.5 MG/3ML) 0.083% nebulizer solution Commonly known as: PROVENTIL Take 3 mLs (2.5 mg total) by nebulization every 6 (six) hours as needed for wheezing or shortness of breath (dx: J43.9).   albuterol 108 (90 Base) MCG/ACT inhaler Commonly known as: ProAir HFA INHALE 2 PUFFS INTO THE LUNGS EVERY 6 HOURS AS NEEDED FOR WHEEZING OR SHORTNESS OF BREATH   ALPRAZolam 0.5 MG tablet Commonly known as: XANAX Take 0.5 mg by mouth 3 (three) times daily as needed for sleep or anxiety.   Benicar 20 MG tablet Generic drug: olmesartan Take 20 mg by mouth daily.   calamine lotion Apply 1  application topically as needed (bug bites).   diclofenac sodium 1 % Gel Commonly known as: VOLTAREN Apply 2-4 g topically 4 (four) times daily as needed for pain.   empagliflozin 25 MG Tabs tablet Commonly known as: JARDIANCE Take 25 mg by mouth daily.   furosemide 40 MG tablet Commonly known as: LASIX Take 40 mg by mouth daily as needed for fluid. Fluid   gabapentin 600 MG tablet Commonly known as: NEURONTIN Take 600 mg by mouth at bedtime.   HumaLOG KwikPen 100 UNIT/ML KwikPen Generic drug: insulin lispro Inject 8-14 Units into the skin See admin instructions. Inject 8 units subcutaneously in the morning & inject 14 units subcutaneously at supper   hydrOXYzine 25 MG tablet Commonly known as: ATARAX/VISTARIL Take 25 mg by mouth 3 (three) times daily as needed (Neuropathic itch).   Lantus SoloStar 100 UNIT/ML Solostar Pen Generic drug: insulin glargine Inject into the skin 2 (two) times a day. Inject 50 units under the skin in the morning and 60 units in the evening.   loratadine 10 MG tablet Commonly known as: CLARITIN Take 10 mg by mouth daily.   Lubricant Eye Drops 0.5 % Soln Generic drug: carboxymethylcellulose 1 drop 3 (three) times daily as needed.   metFORMIN 500 MG 24 hr tablet Commonly known as: GLUCOPHAGE-XR Take 2,000 mg by mouth daily.   methotrexate 2.5 MG tablet Commonly known as: RHEUMATREX Take 2.5 mg by mouth once a week. Caution:Chemotherapy. Protect from light.   montelukast 10 MG tablet Commonly known as: SINGULAIR TAKE 1 TABLET AT BEDTIME   omeprazole 40 MG capsule Commonly known as: PRILOSEC TAKE 1 CAPSULE EVERY MORNING   REMICADE IV Inject 1 Dose into the vein every 6 (six) weeks.   sertraline 50 MG tablet Commonly known as: ZOLOFT Take 50 mg by mouth daily.   simvastatin 40 MG tablet Commonly known as: ZOCOR Take 40 mg by mouth daily with supper.   Trelegy Ellipta 100-62.5-25 MCG/INH Aepb Generic drug:  Fluticasone-Umeclidin-Vilant Inhale 1 puff into the lungs daily.           Past Medical History:  Diagnosis Date   Asthma    COPD (chronic obstructive pulmonary disease) ([place])  Depression    DM type 1 (diabetes mellitus, type 1) ([facility])    GERD (gastroesophageal reflux disease)    Hyperlipidemia    Hypertension    Morbid obesity ([place])    Obstructive sleep apnea syndrome    Osteoarthritis of right knee 04/2010   end-stage   PONV (postoperative nausea and vomiting)    Rheumatoid arthritis(714.0)    on methotrexate therapy   Septic arthritis of knee, right ([place]) 04/2010    Past Surgical History:  Procedure Laterality Date   ABDOMINAL HYSTERECTOMY  1992   ABDOMINAL WALL MESH  REMOVAL  08/13/2009   with removal of retained stitches   [place]   for cardiac tamponade   APPENDECTOMY  1967   BIOPSY  10/29/2018   Procedure: BIOPSY;  Surgeon: Doran Stabler, MD;  Location: [facility];  Service: Gastroenterology;;   BONE BIOPSY Right 04/19/2016   Procedure: BONE BIOPSY RIGHT GREAT TOE;  Surgeon: Caprice Beaver, DPM;  Location: [facility];  Service: Podiatry;  Laterality: Right;   CHOLECYSTECTOMY  1992   laparoscopic   COLONOSCOPY  12/08/2011   Procedure: COLONOSCOPY;  Surgeon: Rogene Houston, MD;  [facility];  Service: Endoscopy;  Laterality: N/A;  830   COLONOSCOPY WITH PROPOFOL N/A 10/16/2017   Procedure: COLONOSCOPY WITH PROPOFOL;  Surgeon: Doran Stabler, MD;  [facility];  Service: Gastroenterology;  Laterality: N/A;   COLONOSCOPY WITH PROPOFOL N/A 10/29/2018   Procedure: COLONOSCOPY WITH PROPOFOL;  Surgeon: Doran Stabler, MD;  [facility];  Service: Gastroenterology;  Laterality: N/A;   ESOPHAGOGASTRODUODENOSCOPY (EGD) WITH PROPOFOL N/A 10/29/2018   Procedure: ESOPHAGOGASTRODUODENOSCOPY (EGD) WITH PROPOFOL;  Surgeon: Doran Stabler, MD;  [facility];  Service:  Gastroenterology;  Laterality: N/A;   FOREIGN BODY REMOVAL ABDOMINAL  01/04/2010   stitch abscesses   HERNIA REPAIR  2001   open LOA / [place] repair w mesh.  Dr. Arnoldo Morale   HERNIA REPAIR  2007   open redo LOA / [place] repair w mesh.  Dr. Arnoldo Morale   POLYPECTOMY  10/16/2017   Procedure: POLYPECTOMY;  Surgeon: Doran Stabler, MD;  Location: [facility];  Service: Gastroenterology;;   POLYPECTOMY  10/29/2018   Procedure: POLYPECTOMY;  Surgeon: Doran Stabler, MD;  Location: [facility];  Service: Gastroenterology;;   REVISION TOTAL KNEE ARTHROPLASTY  06/09/2010   I and D    TOTAL KNEE ARTHROPLASTY  05/07/2010   right knee    Family History  Problem Relation Age of Onset   Cancer Mother    Hyperlipidemia Mother    Diabetes Mother    Thyroid disease Mother    Diabetes Father    COPD Father    Arthritis Father        RA   Hyperlipidemia Brother    Hypertension Brother     Social History:  reports that she quit smoking about 27 years ago. Her smoking use included cigarettes. She has a 50.00 pack-year smoking history. She has never used smokeless tobacco. She reports that she does not drink alcohol and does not use drugs.  Allergies:  Allergies  Allergen Reactions   Demerol Nausea And Vomiting    Severe   Hydromorphone Hcl Itching    All over the body.   Tetracycline Hives     Review of Systems  She has been on insulin for her diabetes since 1992 Also on Jardiance, Bydureon and metformin her medications are being prescribed  by her PCP No recent labs available   Lab Results  Component Value Date   HGBA1C 6.6 (A) 12/22/2017   HGBA1C 7.8 (H) 09/16/2016   HGBA1C (H) 06/10/2010    7.0 (NOTE)                                                                       According to the ADA Clinical Practice Recommendations for 2011, when HbA1c is used as a screening test:   >=6.5%   Diagnostic of Diabetes Mellitus           (if abnormal result  is confirmed)   5.7-6.4%   Increased risk of developing Diabetes Mellitus  References:Diagnosis and Classification of Diabetes Mellitus,Diabetes CBUL,8453,64(WOEHO 1):S62-S69 and Standards of Medical Care in         Diabetes - 2011,Diabetes Care,2011,34  (Suppl 1):S11-S61.   Lab Results  Component Value Date   CREATININE 0.92 07/30/2017     HYPERTENSION: She is taking Benicar 20 mg daily followed by PCP Does not monitor at home   Examination:   There were no vitals taken for this visit.    Assessment/Plan:  Subclinical Hyperthyroidism, initially evaluated in 2019  She has had a persistently suppressed TSH without symptomatic or overt hyperthyroidism  Also at baseline her T3 level was upper normal without any symptoms  She has no history of goiter and previous thyroid scan was normal  No evidence of toxic nodular goiter, nodules or Graves' disease on previous studies  Without taking PTU since 5/21 her free T3 has stayed in a good range compared to upper normal levels previously  Again TSH is suppressed As before she is asymptomatic  Since her T3 level is not trending higher over the last 4 months she does not need to be on PTU again She has not had any history of cardiac arrhythmias and likely does not need any treatment unless her T3 starts going up  She will now need to follow-up with her PCP only     Elayne Snare 12/19/2019, 9:13 PM       Note: This office note was prepared with Dragon voice recognition system technology. Any transcriptional errors that result from this process are unintentional.

## 2019-12-20 ENCOUNTER — Telehealth (INDEPENDENT_AMBULATORY_CARE_PROVIDER_SITE_OTHER): Payer: Medicare Other | Admitting: Endocrinology

## 2019-12-20 ENCOUNTER — Other Ambulatory Visit: Payer: Self-pay

## 2019-12-20 DIAGNOSIS — E0590 Thyrotoxicosis, unspecified without thyrotoxic crisis or storm: Secondary | ICD-10-CM

## 2019-12-23 DIAGNOSIS — E1342 Other specified diabetes mellitus with diabetic polyneuropathy: Secondary | ICD-10-CM | POA: Diagnosis not present

## 2019-12-23 DIAGNOSIS — R234 Changes in skin texture: Secondary | ICD-10-CM | POA: Diagnosis not present

## 2019-12-30 DIAGNOSIS — H353112 Nonexudative age-related macular degeneration, right eye, intermediate dry stage: Secondary | ICD-10-CM | POA: Diagnosis not present

## 2019-12-30 DIAGNOSIS — H35033 Hypertensive retinopathy, bilateral: Secondary | ICD-10-CM | POA: Diagnosis not present

## 2019-12-30 DIAGNOSIS — H35372 Puckering of macula, left eye: Secondary | ICD-10-CM | POA: Diagnosis not present

## 2019-12-30 DIAGNOSIS — H353123 Nonexudative age-related macular degeneration, left eye, advanced atrophic without subfoveal involvement: Secondary | ICD-10-CM | POA: Diagnosis not present

## 2020-01-06 DIAGNOSIS — R234 Changes in skin texture: Secondary | ICD-10-CM | POA: Diagnosis not present

## 2020-01-06 DIAGNOSIS — E1342 Other specified diabetes mellitus with diabetic polyneuropathy: Secondary | ICD-10-CM | POA: Diagnosis not present

## 2020-01-15 DIAGNOSIS — M0589 Other rheumatoid arthritis with rheumatoid factor of multiple sites: Secondary | ICD-10-CM | POA: Diagnosis not present

## 2020-01-15 DIAGNOSIS — Z79899 Other long term (current) drug therapy: Secondary | ICD-10-CM | POA: Diagnosis not present

## 2020-01-20 DIAGNOSIS — B351 Tinea unguium: Secondary | ICD-10-CM | POA: Diagnosis not present

## 2020-01-20 DIAGNOSIS — L851 Acquired keratosis [keratoderma] palmaris et plantaris: Secondary | ICD-10-CM | POA: Diagnosis not present

## 2020-01-20 DIAGNOSIS — E1342 Other specified diabetes mellitus with diabetic polyneuropathy: Secondary | ICD-10-CM | POA: Diagnosis not present

## 2020-01-21 DIAGNOSIS — M5441 Lumbago with sciatica, right side: Secondary | ICD-10-CM | POA: Diagnosis not present

## 2020-01-21 DIAGNOSIS — M545 Low back pain, unspecified: Secondary | ICD-10-CM | POA: Diagnosis not present

## 2020-01-31 ENCOUNTER — Ambulatory Visit (HOSPITAL_COMMUNITY): Payer: Medicare Other

## 2020-02-06 ENCOUNTER — Other Ambulatory Visit: Payer: Self-pay

## 2020-02-06 ENCOUNTER — Ambulatory Visit (HOSPITAL_COMMUNITY)
Admission: RE | Admit: 2020-02-06 | Discharge: 2020-02-06 | Disposition: A | Discharge status: Home / Self Care | Payer: Medicare Other | Source: Ambulatory Visit | Attending: Internal Medicine | Admitting: Internal Medicine

## 2020-02-06 DIAGNOSIS — Z1231 Encounter for screening mammogram for malignant neoplasm of breast: Secondary | ICD-10-CM | POA: Diagnosis not present

## 2020-02-10 DIAGNOSIS — E1342 Other specified diabetes mellitus with diabetic polyneuropathy: Secondary | ICD-10-CM | POA: Diagnosis not present

## 2020-02-10 DIAGNOSIS — R234 Changes in skin texture: Secondary | ICD-10-CM | POA: Diagnosis not present

## 2020-02-13 DIAGNOSIS — E1140 Type 2 diabetes mellitus with diabetic neuropathy, unspecified: Secondary | ICD-10-CM | POA: Diagnosis not present

## 2020-02-18 ENCOUNTER — Ambulatory Visit (INDEPENDENT_AMBULATORY_CARE_PROVIDER_SITE_OTHER): Payer: Medicare Other | Admitting: Pulmonary Disease

## 2020-02-18 ENCOUNTER — Encounter: Payer: Self-pay | Admitting: Pulmonary Disease

## 2020-02-18 ENCOUNTER — Other Ambulatory Visit: Payer: Self-pay

## 2020-02-18 VITALS — BP 130/60 | HR 89 | Temp 97.5°F | Ht 62.0 in | Wt 266.0 lb

## 2020-02-18 DIAGNOSIS — G4730 Sleep apnea, unspecified: Secondary | ICD-10-CM | POA: Diagnosis not present

## 2020-02-18 DIAGNOSIS — J449 Chronic obstructive pulmonary disease, unspecified: Secondary | ICD-10-CM | POA: Diagnosis not present

## 2020-02-18 DIAGNOSIS — R911 Solitary pulmonary nodule: Secondary | ICD-10-CM

## 2020-02-18 DIAGNOSIS — G4733 Obstructive sleep apnea (adult) (pediatric): Secondary | ICD-10-CM

## 2020-02-18 DIAGNOSIS — E669 Obesity, unspecified: Secondary | ICD-10-CM

## 2020-02-18 DIAGNOSIS — Z8739 Personal history of other diseases of the musculoskeletal system and connective tissue: Secondary | ICD-10-CM

## 2020-02-18 NOTE — Patient Instructions (Signed)
Follow up in June 2022 after you complete your CT chest scan

## 2020-02-18 NOTE — Progress Notes (Signed)
[name], Critical Care, and Sleep Medicine  Chief Complaint  Patient presents with  . Follow-up    productive cough with clear to yellow phlegm for past 2 weeks    Constitutional:  BP 130/60 (BP Location: Left Arm, Cuff Size: Normal)   Pulse 89   Temp (!) 97.5 F (36.4 C) (Other (Comment)) Comment (Src): wrist  Ht 5\' 2"  (1.575 m)   Wt 266 lb (120.7 kg)   SpO2 94% Comment: Room air  BMI 48.65 kg/m   Past Medical History:  RA, HTN, HLD, GERD, DM, Depression  Past Surgical History:  Her  has a past surgical history that includes Total knee arthroplasty (05/07/2010); Revision total knee arthroplasty (06/09/2010); Appendectomy (1967); Abdominal hysterectomy (1992); Cholecystectomy (1992); Aorto-[name] window repair (1992); Hernia repair (2001); Hernia repair (2007); Abdominal wall mesh  removal (08/13/2009); Foreign body removal abdominal (01/04/2010); Colonoscopy (12/08/2011); Bone biopsy (Right, 04/19/2016); Colonoscopy with propofol (N/A, 10/16/2017); polypectomy (10/16/2017); Esophagogastroduodenoscopy (egd) with propofol (N/A, 10/29/2018); Colonoscopy with propofol (N/A, 10/29/2018); polypectomy (10/29/2018); and biopsy (10/29/2018).  Brief Summary:  [name] is a 69 y.o. female former smoker with obstructive sleep apnea and COPD.      Subjective:   She has noticed more cough over the past couple of days.  Bringing up clear sputum and intermittent wheezing.  Not having fever, chest pain, or worsening dyspnea.  She doesn't feel her symptoms are bad enough that she would need ABx or prednisone again.  She is going to try using mucinex.  Albuterol has been helping also.  She uses CPAP nightly.  No issues with mask fit.  She has a back up machine she uses when travelling.  Physical Exam:   Appearance - well kempt   ENMT - no sinus tenderness, no oral exudate, no LAN, Mallampati 3 airway, no stridor  Respiratory - decreased breath sounds bilaterally, no wheezing or  rales  CV - s1s2 regular rate and rhythm, no murmurs  Ext - no clubbing, no edema  Skin - no rashes  Psych - normal mood and affect   [name] testing:   PFT 09/18/07 >> FEV1 1.38 (61%), FEV1% 59, TLC 4.48 (95%), DLCO 80%, + BD  PFT 08/09/17 >> FEV1 1.35 (61%), FEV1% 69, TLC 4.67 (98%), DLCO 97%  Chest Imaging:   HRCT chest 08/17/17 >> calcification RV, atherosclerosis, scarring, mild air trapping, micronodularity LLL, 1.4 cm nodule LUL, fatty liver  CT chest 11/29/17 >> LUL nodule resolved  HRCT chest 09/11/19 >> mild basilar subpleural scarring, 1.4 x 1.1 area of GGO LUL  Sleep Tests:   PSG 09/02/07 >> AHI 9  CPAP 07/16/19 to 08/14/19 >> used on 30 of 30 nights with average 8 hrs 28 min.  Average AHI 1.6 with CPAP 9 cm H2O.  Cardiac Tests:   Echo 09/01/17 >> EF 65 to 70%, mild AS, severe MV calcification  Social History:  She  reports that she quit smoking about 27 years ago. Her smoking use included cigarettes. She has a 50.00 pack-year smoking history. She has never used smokeless tobacco. She reports that she does not drink alcohol and does not use drugs.  Family History:  Her family history includes Arthritis in her father; COPD in her father; Cancer in her mother; Diabetes in her father and mother; Hyperlipidemia in her brother and mother; Hypertension in her brother; Thyroid disease in her mother.     Assessment/Plan:   COPD with chronic bronchitis. - continue trelegy, singulair and prn proair - she will  resume using mucinex for now - discussed symptoms to monitor for that would indicate she might need antibiotics and/or prednisone   Obstructive sleep apnea. - she is compliant with CPAP and reports benefit - she uses [facility] for her DME - continue CPAP 9 cm H2O  Lt upper lobe area of ground glass opacification. - f/u CT chest without contrast scheduled for June 2022  Obesity. - discussed importance of weight loss  Rheumatoid  arthritis. - followed by Dr. Amil Amen with rheumatology - on MTX, remicade - she has very mild subpleural scarring on CT chest from June 2021; I don't think this is of clinical significance at present but will monitor with follow up CT chest  Time Spent Involved in Patient Care on Day of Examination:  22 minutes  Follow up:  Patient Instructions  Follow up in June 2022 after you complete your CT chest scan   Medication List:   Allergies as of 02/18/2020      Reactions   Demerol Nausea And Vomiting   Severe   Hydromorphone Hcl Itching   All over the body.   Tetracycline Hives      Medication List       Accurate as of February 18, 2020 12:19 PM. If you have any questions, ask your nurse or doctor.        acetaminophen 325 MG tablet Commonly known as: TYLENOL Take 650 mg by mouth every 6 (six) hours as needed (for pain.).   albuterol (2.5 MG/3ML) 0.083% nebulizer solution Commonly known as: PROVENTIL Take 3 mLs (2.5 mg total) by nebulization every 6 (six) hours as needed for wheezing or shortness of breath (dx: J43.9).   albuterol 108 (90 Base) MCG/ACT inhaler Commonly known as: ProAir HFA INHALE 2 PUFFS INTO THE LUNGS EVERY 6 HOURS AS NEEDED FOR WHEEZING OR SHORTNESS OF BREATH   ALPRAZolam 0.5 MG tablet Commonly known as: XANAX Take 0.5 mg by mouth 3 (three) times daily as needed for sleep or anxiety.   Benicar 20 MG tablet Generic drug: olmesartan Take 20 mg by mouth daily.   calamine lotion Apply 1 application topically as needed (bug bites).   diclofenac sodium 1 % Gel Commonly known as: VOLTAREN Apply 2-4 g topically 4 (four) times daily as needed for pain.   empagliflozin 25 MG Tabs tablet Commonly known as: JARDIANCE Take 25 mg by mouth daily.   furosemide 40 MG tablet Commonly known as: LASIX Take 40 mg by mouth daily as needed for fluid. Fluid   gabapentin 600 MG tablet Commonly known as: NEURONTIN Take 600 mg by mouth at bedtime.   HumaLOG  KwikPen 100 UNIT/ML KwikPen Generic drug: insulin lispro Inject 8-14 Units into the skin See admin instructions. Inject 8 units subcutaneously in the morning & inject 14 units subcutaneously at supper   hydrOXYzine 25 MG tablet Commonly known as: ATARAX/VISTARIL Take 25 mg by mouth 3 (three) times daily as needed (Neuropathic itch).   Lantus SoloStar 100 UNIT/ML Solostar Pen Generic drug: insulin glargine Inject into the skin 2 (two) times a day. Inject 50 units under the skin in the morning and 60 units in the evening.   loratadine 10 MG tablet Commonly known as: CLARITIN Take 10 mg by mouth daily.   Lubricant Eye Drops 0.5 % Soln Generic drug: carboxymethylcellulose 1 drop 3 (three) times daily as needed.   metFORMIN 500 MG 24 hr tablet Commonly known as: GLUCOPHAGE-XR Take 2,000 mg by mouth daily.   methotrexate 2.5 MG  tablet Commonly known as: RHEUMATREX Take 2.5 mg by mouth once a week. Caution:Chemotherapy. Protect from light.   montelukast 10 MG tablet Commonly known as: SINGULAIR TAKE 1 TABLET AT BEDTIME   omeprazole 40 MG capsule Commonly known as: PRILOSEC TAKE 1 CAPSULE EVERY MORNING   REMICADE IV Inject 1 Dose into the vein every 6 (six) weeks.   sertraline 50 MG tablet Commonly known as: ZOLOFT Take 50 mg by mouth daily.   simvastatin 40 MG tablet Commonly known as: ZOCOR Take 40 mg by mouth daily with supper.   Trelegy Ellipta 100-62.5-25 MCG/INH Aepb Generic drug: Fluticasone-Umeclidin-Vilant Inhale 1 puff into the lungs daily.   Trulicity 9.06 UJ/3.4MG Sopn Generic drug: Dulaglutide SMARTSIG:0.5 Milliliter(s) SUB-Q Once a Week       Signature:  [name], MD [place] Pager - [phone] 02/18/2020, 12:19 PM

## 2020-02-20 ENCOUNTER — Other Ambulatory Visit: Payer: Self-pay | Admitting: Pulmonary Disease

## 2020-02-20 DIAGNOSIS — F329 Major depressive disorder, single episode, unspecified: Secondary | ICD-10-CM | POA: Diagnosis not present

## 2020-02-20 DIAGNOSIS — M069 Rheumatoid arthritis, unspecified: Secondary | ICD-10-CM | POA: Diagnosis not present

## 2020-02-20 DIAGNOSIS — R7309 Other abnormal glucose: Secondary | ICD-10-CM | POA: Diagnosis not present

## 2020-02-20 DIAGNOSIS — E1140 Type 2 diabetes mellitus with diabetic neuropathy, unspecified: Secondary | ICD-10-CM | POA: Diagnosis not present

## 2020-02-20 DIAGNOSIS — I10 Essential (primary) hypertension: Secondary | ICD-10-CM | POA: Diagnosis not present

## 2020-02-20 NOTE — Telephone Encounter (Signed)
Dr. Halford Chessman I do not see where you have mentioned for her to continue on montelukast recently, is it okay to refill?  Thanks.

## 2020-02-26 DIAGNOSIS — M0589 Other rheumatoid arthritis with rheumatoid factor of multiple sites: Secondary | ICD-10-CM | POA: Diagnosis not present

## 2020-03-02 DIAGNOSIS — R234 Changes in skin texture: Secondary | ICD-10-CM | POA: Diagnosis not present

## 2020-03-02 DIAGNOSIS — E1342 Other specified diabetes mellitus with diabetic polyneuropathy: Secondary | ICD-10-CM | POA: Diagnosis not present

## 2020-03-30 DIAGNOSIS — H353112 Nonexudative age-related macular degeneration, right eye, intermediate dry stage: Secondary | ICD-10-CM | POA: Diagnosis not present

## 2020-03-30 DIAGNOSIS — H35033 Hypertensive retinopathy, bilateral: Secondary | ICD-10-CM | POA: Diagnosis not present

## 2020-03-30 DIAGNOSIS — H43813 Vitreous degeneration, bilateral: Secondary | ICD-10-CM | POA: Diagnosis not present

## 2020-03-30 DIAGNOSIS — H353123 Nonexudative age-related macular degeneration, left eye, advanced atrophic without subfoveal involvement: Secondary | ICD-10-CM | POA: Diagnosis not present

## 2020-04-06 DIAGNOSIS — B351 Tinea unguium: Secondary | ICD-10-CM | POA: Diagnosis not present

## 2020-04-06 DIAGNOSIS — E1342 Other specified diabetes mellitus with diabetic polyneuropathy: Secondary | ICD-10-CM | POA: Diagnosis not present

## 2020-04-06 DIAGNOSIS — R234 Changes in skin texture: Secondary | ICD-10-CM | POA: Diagnosis not present

## 2020-04-06 DIAGNOSIS — L851 Acquired keratosis [keratoderma] palmaris et plantaris: Secondary | ICD-10-CM | POA: Diagnosis not present

## 2020-04-08 DIAGNOSIS — H01002 Unspecified blepharitis right lower eyelid: Secondary | ICD-10-CM | POA: Diagnosis not present

## 2020-04-08 DIAGNOSIS — H353112 Nonexudative age-related macular degeneration, right eye, intermediate dry stage: Secondary | ICD-10-CM | POA: Diagnosis not present

## 2020-04-08 DIAGNOSIS — H01001 Unspecified blepharitis right upper eyelid: Secondary | ICD-10-CM | POA: Diagnosis not present

## 2020-04-08 DIAGNOSIS — H353124 Nonexudative age-related macular degeneration, left eye, advanced atrophic with subfoveal involvement: Secondary | ICD-10-CM | POA: Diagnosis not present

## 2020-04-14 DIAGNOSIS — Z79899 Other long term (current) drug therapy: Secondary | ICD-10-CM | POA: Diagnosis not present

## 2020-04-14 DIAGNOSIS — M0589 Other rheumatoid arthritis with rheumatoid factor of multiple sites: Secondary | ICD-10-CM | POA: Diagnosis not present

## 2020-04-20 DIAGNOSIS — E1142 Type 2 diabetes mellitus with diabetic polyneuropathy: Secondary | ICD-10-CM | POA: Diagnosis not present

## 2020-04-20 DIAGNOSIS — R234 Changes in skin texture: Secondary | ICD-10-CM | POA: Diagnosis not present

## 2020-04-29 DIAGNOSIS — M150 Primary generalized (osteo)arthritis: Secondary | ICD-10-CM | POA: Diagnosis not present

## 2020-04-29 DIAGNOSIS — M545 Low back pain, unspecified: Secondary | ICD-10-CM | POA: Diagnosis not present

## 2020-04-29 DIAGNOSIS — M0589 Other rheumatoid arthritis with rheumatoid factor of multiple sites: Secondary | ICD-10-CM | POA: Diagnosis not present

## 2020-04-29 DIAGNOSIS — L299 Pruritus, unspecified: Secondary | ICD-10-CM | POA: Diagnosis not present

## 2020-04-29 DIAGNOSIS — Z79899 Other long term (current) drug therapy: Secondary | ICD-10-CM | POA: Diagnosis not present

## 2020-04-29 DIAGNOSIS — Z6841 Body Mass Index (BMI) 40.0 and over, adult: Secondary | ICD-10-CM | POA: Diagnosis not present

## 2020-05-11 DIAGNOSIS — E1342 Other specified diabetes mellitus with diabetic polyneuropathy: Secondary | ICD-10-CM | POA: Diagnosis not present

## 2020-05-11 DIAGNOSIS — R234 Changes in skin texture: Secondary | ICD-10-CM | POA: Diagnosis not present

## 2020-05-18 DIAGNOSIS — E1140 Type 2 diabetes mellitus with diabetic neuropathy, unspecified: Secondary | ICD-10-CM | POA: Diagnosis not present

## 2020-05-22 DIAGNOSIS — R7309 Other abnormal glucose: Secondary | ICD-10-CM | POA: Diagnosis not present

## 2020-05-22 DIAGNOSIS — E1140 Type 2 diabetes mellitus with diabetic neuropathy, unspecified: Secondary | ICD-10-CM | POA: Diagnosis not present

## 2020-05-22 DIAGNOSIS — I10 Essential (primary) hypertension: Secondary | ICD-10-CM | POA: Diagnosis not present

## 2020-05-25 DIAGNOSIS — R234 Changes in skin texture: Secondary | ICD-10-CM | POA: Diagnosis not present

## 2020-05-25 DIAGNOSIS — E1342 Other specified diabetes mellitus with diabetic polyneuropathy: Secondary | ICD-10-CM | POA: Diagnosis not present

## 2020-05-26 DIAGNOSIS — M0589 Other rheumatoid arthritis with rheumatoid factor of multiple sites: Secondary | ICD-10-CM | POA: Diagnosis not present

## 2020-06-22 DIAGNOSIS — L84 Corns and callosities: Secondary | ICD-10-CM | POA: Diagnosis not present

## 2020-06-22 DIAGNOSIS — B351 Tinea unguium: Secondary | ICD-10-CM | POA: Diagnosis not present

## 2020-06-22 DIAGNOSIS — E1142 Type 2 diabetes mellitus with diabetic polyneuropathy: Secondary | ICD-10-CM | POA: Diagnosis not present

## 2020-06-23 DIAGNOSIS — H5201 Hypermetropia, right eye: Secondary | ICD-10-CM | POA: Diagnosis not present

## 2020-06-23 DIAGNOSIS — H5212 Myopia, left eye: Secondary | ICD-10-CM | POA: Diagnosis not present

## 2020-06-23 DIAGNOSIS — H524 Presbyopia: Secondary | ICD-10-CM | POA: Diagnosis not present

## 2020-06-23 DIAGNOSIS — H353134 Nonexudative age-related macular degeneration, bilateral, advanced atrophic with subfoveal involvement: Secondary | ICD-10-CM | POA: Diagnosis not present

## 2020-06-23 DIAGNOSIS — E119 Type 2 diabetes mellitus without complications: Secondary | ICD-10-CM | POA: Diagnosis not present

## 2020-06-23 DIAGNOSIS — H52223 Regular astigmatism, bilateral: Secondary | ICD-10-CM | POA: Diagnosis not present

## 2020-06-23 DIAGNOSIS — Z961 Presence of intraocular lens: Secondary | ICD-10-CM | POA: Diagnosis not present

## 2020-07-11 ENCOUNTER — Other Ambulatory Visit: Payer: Self-pay | Admitting: Pulmonary Disease

## 2020-07-14 DIAGNOSIS — M0589 Other rheumatoid arthritis with rheumatoid factor of multiple sites: Secondary | ICD-10-CM | POA: Diagnosis not present

## 2020-07-20 DIAGNOSIS — E1142 Type 2 diabetes mellitus with diabetic polyneuropathy: Secondary | ICD-10-CM | POA: Diagnosis not present

## 2020-07-20 DIAGNOSIS — L97512 Non-pressure chronic ulcer of other part of right foot with fat layer exposed: Secondary | ICD-10-CM | POA: Diagnosis not present

## 2020-07-27 NOTE — Telephone Encounter (Signed)
PCC's, this pt is asking about her CT being scheduled. She is aware it's not due until June, but just wants to make sure this did not slip through the cracks.

## 2020-08-03 DIAGNOSIS — E1142 Type 2 diabetes mellitus with diabetic polyneuropathy: Secondary | ICD-10-CM | POA: Diagnosis not present

## 2020-08-03 DIAGNOSIS — L97512 Non-pressure chronic ulcer of other part of right foot with fat layer exposed: Secondary | ICD-10-CM | POA: Diagnosis not present

## 2020-08-04 ENCOUNTER — Ambulatory Visit: Payer: Medicare Other | Attending: Internal Medicine

## 2020-08-04 ENCOUNTER — Other Ambulatory Visit: Payer: Self-pay

## 2020-08-04 ENCOUNTER — Other Ambulatory Visit (HOSPITAL_BASED_OUTPATIENT_CLINIC_OR_DEPARTMENT_OTHER): Payer: Self-pay

## 2020-08-04 DIAGNOSIS — Z23 Encounter for immunization: Secondary | ICD-10-CM

## 2020-08-04 MED ORDER — PFIZER-BIONT COVID-19 VAC-TRIS 30 MCG/0.3ML IM SUSP
INTRAMUSCULAR | 0 refills | Status: DC
  Filled 2020-08-04: qty 0.3, 1d supply, fill #0

## 2020-08-04 NOTE — Progress Notes (Signed)
   Covid-19 Vaccination Clinic  Name:  [name]    MRN: [id number] DOB: 01-24-1951  08/04/2020  [name] was observed post Covid-19 immunization for 15 minutes without incident. She was provided with Vaccine Information Sheet and instruction to access the V-Safe system.   [name] was instructed to call 911 with any severe reactions post vaccine: Marland Kitchen Difficulty breathing  . Swelling of face and throat  . A fast heartbeat  . A bad rash all over body  . Dizziness and weakness   Immunizations Administered    Name Date Dose VIS Date Route   PFIZER Comrnaty(Gray TOP) Covid-19 Vaccine 08/04/2020  2:06 PM 0.3 mL 03/19/2020 Intramuscular   Manufacturer: [employer]   Lot: [id number]   NDC: [phone]

## 2020-08-13 DIAGNOSIS — I10 Essential (primary) hypertension: Secondary | ICD-10-CM | POA: Diagnosis not present

## 2020-08-13 DIAGNOSIS — Z79899 Other long term (current) drug therapy: Secondary | ICD-10-CM | POA: Diagnosis not present

## 2020-08-13 DIAGNOSIS — E1140 Type 2 diabetes mellitus with diabetic neuropathy, unspecified: Secondary | ICD-10-CM | POA: Diagnosis not present

## 2020-08-13 DIAGNOSIS — M069 Rheumatoid arthritis, unspecified: Secondary | ICD-10-CM | POA: Diagnosis not present

## 2020-08-13 DIAGNOSIS — E785 Hyperlipidemia, unspecified: Secondary | ICD-10-CM | POA: Diagnosis not present

## 2020-08-20 DIAGNOSIS — R7309 Other abnormal glucose: Secondary | ICD-10-CM | POA: Diagnosis not present

## 2020-08-20 DIAGNOSIS — D509 Iron deficiency anemia, unspecified: Secondary | ICD-10-CM | POA: Diagnosis not present

## 2020-08-20 DIAGNOSIS — E1122 Type 2 diabetes mellitus with diabetic chronic kidney disease: Secondary | ICD-10-CM | POA: Diagnosis not present

## 2020-08-20 DIAGNOSIS — E785 Hyperlipidemia, unspecified: Secondary | ICD-10-CM | POA: Diagnosis not present

## 2020-08-24 DIAGNOSIS — L97512 Non-pressure chronic ulcer of other part of right foot with fat layer exposed: Secondary | ICD-10-CM | POA: Diagnosis not present

## 2020-08-24 DIAGNOSIS — E1142 Type 2 diabetes mellitus with diabetic polyneuropathy: Secondary | ICD-10-CM | POA: Diagnosis not present

## 2020-08-25 DIAGNOSIS — M0589 Other rheumatoid arthritis with rheumatoid factor of multiple sites: Secondary | ICD-10-CM | POA: Diagnosis not present

## 2020-08-27 DIAGNOSIS — M150 Primary generalized (osteo)arthritis: Secondary | ICD-10-CM | POA: Diagnosis not present

## 2020-08-27 DIAGNOSIS — Z6841 Body Mass Index (BMI) 40.0 and over, adult: Secondary | ICD-10-CM | POA: Diagnosis not present

## 2020-08-27 DIAGNOSIS — L299 Pruritus, unspecified: Secondary | ICD-10-CM | POA: Diagnosis not present

## 2020-08-27 DIAGNOSIS — M0589 Other rheumatoid arthritis with rheumatoid factor of multiple sites: Secondary | ICD-10-CM | POA: Diagnosis not present

## 2020-08-27 DIAGNOSIS — Z79899 Other long term (current) drug therapy: Secondary | ICD-10-CM | POA: Diagnosis not present

## 2020-08-27 DIAGNOSIS — M545 Low back pain, unspecified: Secondary | ICD-10-CM | POA: Diagnosis not present

## 2020-09-08 DIAGNOSIS — L97512 Non-pressure chronic ulcer of other part of right foot with fat layer exposed: Secondary | ICD-10-CM | POA: Diagnosis not present

## 2020-09-08 DIAGNOSIS — E1142 Type 2 diabetes mellitus with diabetic polyneuropathy: Secondary | ICD-10-CM | POA: Diagnosis not present

## 2020-09-10 ENCOUNTER — Ambulatory Visit: Payer: Medicare Other | Admitting: Obstetrics & Gynecology

## 2020-09-10 ENCOUNTER — Encounter: Payer: Self-pay | Admitting: Obstetrics & Gynecology

## 2020-09-10 ENCOUNTER — Other Ambulatory Visit: Payer: Self-pay

## 2020-09-10 VITALS — BP 92/50 | HR 79 | Ht 62.0 in | Wt 263.0 lb

## 2020-09-10 DIAGNOSIS — B3731 Acute candidiasis of vulva and vagina: Secondary | ICD-10-CM

## 2020-09-10 DIAGNOSIS — N904 Leukoplakia of vulva: Secondary | ICD-10-CM

## 2020-09-10 DIAGNOSIS — B373 Candidiasis of vulva and vagina: Secondary | ICD-10-CM

## 2020-09-10 MED ORDER — FLUCONAZOLE 100 MG PO TABS: ORAL_TABLET | ORAL | 0 refills | Status: DC

## 2020-09-10 NOTE — Progress Notes (Signed)
Chief Complaint  Patient presents with   referral    Recurrent yeast infection/ vaginal dryness      70 y.o. G2P2 No LMP recorded. Patient has had a hysterectomy. The current method of family planning is status post hysterectomy.  Outpatient Encounter Medications as of 09/10/2020  Medication Sig Note   acetaminophen (TYLENOL) 325 MG tablet Take 650 mg by mouth every 6 (six) hours as needed (for pain.).    ALPRAZolam (XANAX) 0.5 MG tablet Take 0.5 mg by mouth 3 (three) times daily as needed for sleep or anxiety.     BENICAR 20 MG tablet Take 20 mg by mouth daily.     carboxymethylcellulose (LUBRICANT EYE DROPS) 0.5 % SOLN 1 drop 3 (three) times daily as needed.    diclofenac sodium (VOLTAREN) 1 % GEL Apply 2-4 g topically 4 (four) times daily as needed for pain. 10/22/2018: On hand   empagliflozin (JARDIANCE) 25 MG TABS tablet Take 25 mg by mouth daily.    furosemide (LASIX) 40 MG tablet Take 40 mg by mouth daily as needed for fluid. Fluid    gabapentin (NEURONTIN) 600 MG tablet Take 600 mg by mouth at bedtime.    HUMALOG KWIKPEN 100 UNIT/ML KwikPen Inject 8-14 Units into the skin See admin instructions. Inject 8 units subcutaneously in the morning & inject 14 units subcutaneously at supper    InFLIXimab (REMICADE IV) Inject 1 Dose into the vein every 6 (six) weeks.  10/22/2018: Next dose due:10/24/2018   LANTUS SOLOSTAR 100 UNIT/ML Solostar Pen Inject into the skin 2 (two) times a day. Inject 50 units under the skin in the morning and 60 units in the evening.    loratadine (CLARITIN) 10 MG tablet Take 10 mg by mouth daily.    metFORMIN (GLUCOPHAGE-XR) 500 MG 24 hr tablet Take 2,000 mg by mouth daily.    methotrexate (RHEUMATREX) 2.5 MG tablet Take 2.5 mg by mouth once a week. Caution:Chemotherapy. Protect from light.    sertraline (ZOLOFT) 50 MG tablet Take 50 mg by mouth daily.     simvastatin (ZOCOR) 40 MG tablet Take 40 mg by mouth daily with supper.    TRULICITY 0.75 MG/0.5ML  SOPN SMARTSIG:0.5 Milliliter(s) SUB-Q Once a Week    [DISCONTINUED] albuterol (PROAIR HFA) 108 (90 Base) MCG/ACT inhaler INHALE 2 PUFFS INTO THE LUNGS EVERY 6 HOURS AS NEEDED FOR WHEEZING OR SHORTNESS OF BREATH    [DISCONTINUED] COVID-19 mRNA Vac-TriS, Pfizer, (PFIZER-BIONT COVID-19 VAC-TRIS) SUSP injection Inject into the muscle. (Patient not taking: Reported on 09/18/2020)    [DISCONTINUED] fluconazole (DIFLUCAN) 100 MG tablet Take 1 tablet orally every other day    [DISCONTINUED] Fluticasone-Umeclidin-Vilant (TRELEGY ELLIPTA) 100-62.5-25 MCG/INH AEPB Inhale 1 puff into the lungs daily.    [DISCONTINUED] montelukast (SINGULAIR) 10 MG tablet TAKE 1 TABLET AT BEDTIME    [DISCONTINUED] omeprazole (PRILOSEC) 40 MG capsule TAKE 1 CAPSULE EVERY MORNING    [DISCONTINUED] albuterol (PROVENTIL) (2.5 MG/3ML) 0.083% nebulizer solution Take 3 mLs (2.5 mg total) by nebulization every 6 (six) hours as needed for wheezing or shortness of breath (dx: J43.9).    [DISCONTINUED] calamine lotion Apply 1 application topically as needed (bug bites). (Patient not taking: Reported on 09/18/2020)    [DISCONTINUED] hydrOXYzine (ATARAX/VISTARIL) 25 MG tablet Take 25 mg by mouth 3 (three) times daily as needed (Neuropathic itch).    No facility-administered encounter medications on file as of 09/10/2020.    Subjective Vaginal vulvar itching burning Past Medical History:  Diagnosis Date  Asthma    COPD (chronic obstructive pulmonary disease) (HCC)    Depression    DM type 1 (diabetes mellitus, type 1) (HCC)    GERD (gastroesophageal reflux disease)    Hyperlipidemia    Hypertension    Morbid obesity (HCC)    Obstructive sleep apnea syndrome    Osteoarthritis of right knee 04/2010   end-stage   PONV (postoperative nausea and vomiting)    Rheumatoid arthritis(714.0)    on methotrexate therapy   Septic arthritis of knee, right (HCC) 04/2010    Past Surgical History:  Procedure Laterality Date   ABDOMINAL  HYSTERECTOMY  1992   ABDOMINAL WALL MESH  REMOVAL  08/13/2009   with removal of retained stitches   AORTO-PULMONARY WINDOW REPAIR  1992   for cardiac tamponade   APPENDECTOMY  1967   BIOPSY  10/29/2018   Procedure: BIOPSY;  Surgeon: Sherrilyn Rist, MD;  Location: [facility];  Service: Gastroenterology;;   BONE BIOPSY Right 04/19/2016   Procedure: BONE BIOPSY RIGHT GREAT TOE;  Surgeon: Ferman Hamming, DPM;  Location: [facility];  Service: Podiatry;  Laterality: Right;   CHOLECYSTECTOMY  1992   laparoscopic   COLONOSCOPY  12/08/2011   Procedure: COLONOSCOPY;  Surgeon: Malissa Hippo, MD;  Location: [facility];  Service: Endoscopy;  Laterality: N/A;  830   COLONOSCOPY WITH PROPOFOL N/A 10/16/2017   Procedure: COLONOSCOPY WITH PROPOFOL;  Surgeon: Sherrilyn Rist, MD;  Location: [facility];  Service: Gastroenterology;  Laterality: N/A;   COLONOSCOPY WITH PROPOFOL N/A 10/29/2018   Procedure: COLONOSCOPY WITH PROPOFOL;  Surgeon: Sherrilyn Rist, MD;  Location: [facility];  Service: Gastroenterology;  Laterality: N/A;   ESOPHAGOGASTRODUODENOSCOPY (EGD) WITH PROPOFOL N/A 10/29/2018   Procedure: ESOPHAGOGASTRODUODENOSCOPY (EGD) WITH PROPOFOL;  Surgeon: Sherrilyn Rist, MD;  Location: [facility];  Service: Gastroenterology;  Laterality: N/A;   FOREIGN BODY REMOVAL ABDOMINAL  01/04/2010   stitch abscesses   HERNIA REPAIR  2001   open LOA / VWH repair w mesh.  Dr. Lovell Sheehan   HERNIA REPAIR  2007   open redo LOA / VWH repair w mesh.  Dr. Lovell Sheehan   POLYPECTOMY  10/16/2017   Procedure: POLYPECTOMY;  Surgeon: Sherrilyn Rist, MD;  Location: [facility];  Service: Gastroenterology;;   POLYPECTOMY  10/29/2018   Procedure: POLYPECTOMY;  Surgeon: Sherrilyn Rist, MD;  Location: [facility];  Service: Gastroenterology;;   REVISION TOTAL KNEE ARTHROPLASTY  06/09/2010   I and D    TOTAL KNEE ARTHROPLASTY  05/07/2010   right knee    OB History     Gravida  2   Para  2   Term       Preterm      AB      Living  2      SAB      IAB      Ectopic      Multiple      Live Births  2           Allergies  Allergen Reactions   Demerol Nausea And Vomiting    Severe   Hydromorphone Hcl Itching    All over the body.   Tetracycline Hives    Social History   Socioeconomic History   Marital status: Widowed    Spouse name: Not on file   Number of children: Not on file   Years of education: Not on file   Highest education level: Not on  file  Occupational History   Not on file  Tobacco Use   Smoking status: Former    Packs/day: 2.00    Years: 25.00    Additional pack years: 0.00    Total pack years: 50.00    Types: Cigarettes    Quit date: 04/11/1992    Years since quitting: 30.3   Smokeless tobacco: Never  Vaping Use   Vaping Use: Never used  Substance and Sexual Activity   Alcohol use: No   Drug use: No   Sexual activity: Yes    Partners: Male    Birth control/protection: Surgical  Other Topics Concern   Not on file  Social History Narrative   Not on file   Social Determinants of Health   Financial Resource Strain: Low Risk  (09/10/2020)   Overall Financial Resource Strain (CARDIA)    Difficulty of Paying Living Expenses: Not hard at all  Food Insecurity: No Food Insecurity (09/10/2020)   Hunger Vital Sign    Worried About Running Out of Food in the Last Year: Never true    Ran Out of Food in the Last Year: Never true  Transportation Needs: No Transportation Needs (09/10/2020)   PRAPARE - Administrator, Civil Service (Medical): No    Lack of Transportation (Non-Medical): No  Physical Activity: Inactive (09/10/2020)   Exercise Vital Sign    Days of Exercise per Week: 0 days    Minutes of Exercise per Session: 0 min  Stress: Stress Concern Present (09/10/2020)   [employer] of Occupational Health - Occupational Stress Questionnaire    Feeling of Stress : To some extent  Social Connections: Unknown (09/10/2020)   Social  Connection and Isolation Panel [NHANES]    Frequency of Communication with Friends and Family: More than three times a week    Frequency of Social Gatherings with Friends and Family: Twice a week    Attends Religious Services: Not on Marketing executive or Organizations: No    Attends Banker Meetings: 1 to 4 times per year    Marital Status: Widowed    Family History  Adopted: Yes  Problem Relation Age of Onset   Cancer Mother    Hyperlipidemia Mother    Diabetes Mother    Thyroid disease Mother    Diabetes Father    COPD Father    Arthritis Father        RA   Hyperlipidemia Brother    Hypertension Brother     Medications:       Current Outpatient Medications:    acetaminophen (TYLENOL) 325 MG tablet, Take 650 mg by mouth every 6 (six) hours as needed (for pain.)., Disp: , Rfl:    ALPRAZolam (XANAX) 0.5 MG tablet, Take 0.5 mg by mouth 3 (three) times daily as needed for sleep or anxiety. , Disp: , Rfl:    BENICAR 20 MG tablet, Take 20 mg by mouth daily. , Disp: , Rfl:    carboxymethylcellulose (LUBRICANT EYE DROPS) 0.5 % SOLN, 1 drop 3 (three) times daily as needed., Disp: , Rfl:    diclofenac sodium (VOLTAREN) 1 % GEL, Apply 2-4 g topically 4 (four) times daily as needed for pain., Disp: , Rfl:    empagliflozin (JARDIANCE) 25 MG TABS tablet, Take 25 mg by mouth daily., Disp: , Rfl:    furosemide (LASIX) 40 MG tablet, Take 40 mg by mouth daily as needed for fluid. Fluid, Disp: , Rfl:  gabapentin (NEURONTIN) 600 MG tablet, Take 600 mg by mouth at bedtime., Disp: , Rfl:    HUMALOG KWIKPEN 100 UNIT/ML KwikPen, Inject 8-14 Units into the skin See admin instructions. Inject 8 units subcutaneously in the morning & inject 14 units subcutaneously at supper, Disp: , Rfl:    InFLIXimab (REMICADE IV), Inject 1 Dose into the vein every 6 (six) weeks. , Disp: , Rfl:    LANTUS SOLOSTAR 100 UNIT/ML Solostar Pen, Inject into the skin 2 (two) times a day. Inject 50 units  under the skin in the morning and 60 units in the evening., Disp: , Rfl:    loratadine (CLARITIN) 10 MG tablet, Take 10 mg by mouth daily., Disp: , Rfl:    metFORMIN (GLUCOPHAGE-XR) 500 MG 24 hr tablet, Take 2,000 mg by mouth daily., Disp: , Rfl:    methotrexate (RHEUMATREX) 2.5 MG tablet, Take 2.5 mg by mouth once a week. Caution:Chemotherapy. Protect from light., Disp: , Rfl:    sertraline (ZOLOFT) 50 MG tablet, Take 50 mg by mouth daily. , Disp: , Rfl:    simvastatin (ZOCOR) 40 MG tablet, Take 40 mg by mouth daily with supper., Disp: , Rfl:    TRULICITY 0.75 MG/0.5ML SOPN, SMARTSIG:0.5 Milliliter(s) SUB-Q Once a Week, Disp: , Rfl:    albuterol (PROVENTIL) (2.5 MG/3ML) 0.083% nebulizer solution, Take 3 mLs (2.5 mg total) by nebulization every 6 (six) hours as needed for wheezing or shortness of breath (dx: J43.9)., Disp: 360 mL, Rfl: 5   albuterol (VENTOLIN HFA) 108 (90 Base) MCG/ACT inhaler, USE 2 INHALATIONS EVERY 6 HOURS AS NEEDED FOR WHEEZING OR SHORTNESS OF BREATH, Disp: 25.5 g, Rfl: 3   COVID-19 mRNA bivalent vaccine, Pfizer, (PFIZER COVID-19 VAC BIVALENT) injection, Inject into the muscle., Disp: 0.3 mL, Rfl: 0   Ferrous Sulfate (IRON PO), Take by mouth., Disp: , Rfl:    fluticasone (FLONASE) 50 MCG/ACT nasal spray, Place 1 spray into both nostrils daily., Disp: 48 g, Rfl: 3   folic acid (FOLVITE) 1 MG tablet, Take 1 mg by mouth daily., Disp: , Rfl:    montelukast (SINGULAIR) 10 MG tablet, TAKE 1 TABLET AT BEDTIME, Disp: 90 tablet, Rfl: 3   Nystatin (GERHARDT'S BUTT CREAM) CREA, Apply 1 application topically 2 (two) times daily., Disp: 1 each, Rfl: PRN   omeprazole (PRILOSEC) 40 MG capsule, TAKE 1 CAPSULE EVERY MORNING, Disp: 90 capsule, Rfl: 3   TRELEGY ELLIPTA 100-62.5-25 MCG/ACT AEPB, USE 1 INHALATION DAILY, Disp: 360 each, Rfl: 3  Objective Blood pressure (!) 92/50, pulse 79, height 5\' 2"  (1.575 m), weight 263 lb (119.3 kg).  Erythema and appearance of chronic yeast +  LSA  Pertinent ROS   Labs or studies     Impression Diagnoses this Encounter::   ICD-10-CM   1. 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 Lichen sclerosus et atrophicus of the vulva: new/chronic  N90.4    2 years, initial visit for the problem      Established relevant diagnosis(es):   Plan/Recommendations: Meds ordered this encounter  Medications   DISCONTD: fluconazole (DIFLUCAN) 100 MG tablet    Sig: Take 1 tablet orally every other day    Dispense:  14 tablet    Refill:  0    Labs or Scans Ordered: No orders of the defined types were placed in this encounter.   Management:: above  Follow up Return in about 1 month (around 10/10/2020) for  Follow up, with Dr Despina Hidden.

## 2020-09-17 ENCOUNTER — Other Ambulatory Visit: Payer: Self-pay

## 2020-09-17 ENCOUNTER — Ambulatory Visit (HOSPITAL_COMMUNITY)
Admission: RE | Admit: 2020-09-17 | Discharge: 2020-09-17 | Disposition: A | Discharge status: Home / Self Care | Payer: Medicare Other | Source: Ambulatory Visit | Attending: Pulmonary Disease | Admitting: Pulmonary Disease

## 2020-09-17 DIAGNOSIS — I700 Atherosclerosis of aorta: Secondary | ICD-10-CM | POA: Diagnosis not present

## 2020-09-17 DIAGNOSIS — R911 Solitary pulmonary nodule: Secondary | ICD-10-CM | POA: Insufficient documentation

## 2020-09-17 DIAGNOSIS — J432 Centrilobular emphysema: Secondary | ICD-10-CM | POA: Diagnosis not present

## 2020-09-17 DIAGNOSIS — M47814 Spondylosis without myelopathy or radiculopathy, thoracic region: Secondary | ICD-10-CM | POA: Diagnosis not present

## 2020-09-17 DIAGNOSIS — I2510 Atherosclerotic heart disease of native coronary artery without angina pectoris: Secondary | ICD-10-CM | POA: Diagnosis not present

## 2020-09-18 ENCOUNTER — Telehealth: Payer: Self-pay | Admitting: Pulmonary Disease

## 2020-09-18 ENCOUNTER — Other Ambulatory Visit: Payer: Self-pay

## 2020-09-18 ENCOUNTER — Encounter: Payer: Self-pay | Admitting: Pulmonary Disease

## 2020-09-18 ENCOUNTER — Ambulatory Visit (INDEPENDENT_AMBULATORY_CARE_PROVIDER_SITE_OTHER): Payer: Medicare Other | Admitting: Pulmonary Disease

## 2020-09-18 VITALS — BP 150/68 | HR 81 | Temp 97.3°F | Ht 62.0 in | Wt 262.4 lb

## 2020-09-18 DIAGNOSIS — E669 Obesity, unspecified: Secondary | ICD-10-CM | POA: Diagnosis not present

## 2020-09-18 DIAGNOSIS — G4733 Obstructive sleep apnea (adult) (pediatric): Secondary | ICD-10-CM | POA: Diagnosis not present

## 2020-09-18 DIAGNOSIS — J449 Chronic obstructive pulmonary disease, unspecified: Secondary | ICD-10-CM | POA: Diagnosis not present

## 2020-09-18 DIAGNOSIS — G4730 Sleep apnea, unspecified: Secondary | ICD-10-CM | POA: Diagnosis not present

## 2020-09-18 DIAGNOSIS — Z8739 Personal history of other diseases of the musculoskeletal system and connective tissue: Secondary | ICD-10-CM

## 2020-09-18 DIAGNOSIS — R911 Solitary pulmonary nodule: Secondary | ICD-10-CM

## 2020-09-18 NOTE — Telephone Encounter (Signed)
Spoke with pt and notified of results per Dr. Sood. Pt verbalized understanding and denied any questions.  

## 2020-09-18 NOTE — Progress Notes (Signed)
Pulmonary, Critical Care, and Sleep Medicine  Chief Complaint  Patient presents with   Follow-up    Productive cough with thick, white phlegm     Constitutional:  BP (!) 150/68 (BP Location: Left Arm, Cuff Size: Normal)   Pulse 81   Temp (!) 97.3 F (36.3 C) (Temporal)   Ht 5\' 2"  (1.575 m)   Wt 262 lb 6.4 oz (119 kg)   SpO2 95% Comment: room air  BMI 47.99 kg/m   Past Medical History:  RA, HTN, HLD, GERD, DM, Depression  Past Surgical History:  Her  has a past surgical history that includes Total knee arthroplasty (05/07/2010); Revision total knee arthroplasty (06/09/2010); Appendectomy (1967); Abdominal hysterectomy (1992); Cholecystectomy (1992); Aorto-pulmonary window repair (1992); Hernia repair (2001); Hernia repair (2007); Abdominal wall mesh  removal (08/13/2009); Foreign body removal abdominal (01/04/2010); Colonoscopy (12/08/2011); Bone biopsy (Right, 04/19/2016); Colonoscopy with propofol (N/A, 10/16/2017); polypectomy (10/16/2017); Esophagogastroduodenoscopy (egd) with propofol (N/A, 10/29/2018); Colonoscopy with propofol (N/A, 10/29/2018); polypectomy (10/29/2018); and biopsy (10/29/2018).  Brief Summary:  [name] is a 70 y.o. female former smoker with obstructive sleep apnea and COPD.      Subjective:   She had CT chest yesterday.  Showed stable LUL ground glass nodule.  No new findings.  No progression in mild basilar scarring.  Using CPAP nightly.  No issues with mask fit.  Continues to have cough with white sputum.  Sometimes has trouble bringing up phlegm.  Occasionally gets some wheeze.  Using mucinex.  Saw rheumatology couple of weeks ago and everything stable.  Physical Exam:   Appearance - well kempt   ENMT - no sinus tenderness, no oral exudate, no LAN, Mallampati 3 airway, no stridor  Respiratory - scattered rhonchi that clears with coughing  CV - s1s2 regular rate and rhythm, no murmurs  Ext - no clubbing, no edema  Skin - no  rashes  Psych - normal mood and affect    Pulmonary testing:  PFT 09/18/07 >> FEV1 1.38 (61%), FEV1% 59, TLC 4.48 (95%), DLCO 80%, + BD PFT 08/09/17 >> FEV1 1.35 (61%), FEV1% 69, TLC 4.67 (98%), DLCO 97%  Chest Imaging:  HRCT chest 08/17/17 >> calcification RV, atherosclerosis, scarring, mild air trapping, micronodularity LLL, 1.4 cm nodule LUL, fatty liver CT chest 11/29/17 >> LUL nodule resolved HRCT chest 09/11/19 >> mild basilar subpleural scarring, 1.4 x 1.1 area of GGO LUL CT chest 09/17/20 >> mild centrilobular emphysema, LUL GGO 1 x 1.2 cm (no change)  Sleep Tests:  PSG 09/02/07 >> AHI 9 CPAP 07/16/19 to 08/14/19 >> used on 30 of 30 nights with average 8 hrs 28 min.  Average AHI 1.6 with CPAP 9 cm H2O.  Cardiac Tests:  Echo 09/01/17 >> EF 65 to 70%, mild AS, severe MV calcification  Social History:  She  reports that she quit smoking about 28 years ago. Her smoking use included cigarettes. She has a 50.00 pack-year smoking history. She has never used smokeless tobacco. She reports that she does not drink alcohol and does not use drugs.  Family History:  Her family history includes Arthritis in her father; COPD in her father; Cancer in her mother; Diabetes in her father and mother; Hyperlipidemia in her brother and mother; Hypertension in her brother; Thyroid disease in her mother. She was adopted.     Assessment/Plan:   COPD with chronic bronchitis. - continue trelegy, singulair and prn proair - continue mucinex - will have her try using a flutter valve as  needed after she uses inhaler medication - discussed symptoms to monitor for that would indicate she might need antibiotics and/or prednisone    Obstructive sleep apnea. - she is compliant with CPAP and reports benefit - she uses [facility] for her DME - continue CPAP 9 cm H2O - will call her with report from CPAP download   Lt upper lobe area of ground glass opacification. - f/u CT chest without contrast in  June 2024   Obesity. - discussed importance of weight loss   Rheumatoid arthritis. - followed by Dr. Amil Amen with rheumatology - on MTX, remicade - no evidence for rheumatoid lung disease on most recent CT chest  Time Spent Involved in Patient Care on Day of Examination:  32 minutes  Follow up:   Patient Instructions  Will arrange for flutter valve.  Use this after using your inhalers to help loosen phlegm.  Will call you with results of your CPAP download  Follow up in 6 months  Medication List:   Allergies as of 09/18/2020       Reactions   Demerol Nausea And Vomiting   Severe   Hydromorphone Hcl Itching   All over the body.   Tetracycline Hives        Medication List        Accurate as of September 18, 2020 10:27 AM. If you have any questions, ask your nurse or doctor.          STOP taking these medications    calamine lotion Stopped by: [name], MD   Pfizer-BioNT COVID-19 Vac-TriS Susp injection Generic drug: COVID-19 mRNA Vac-TriS Therapist, music) Stopped by: [name], MD       TAKE these medications    acetaminophen 325 MG tablet Commonly known as: TYLENOL Take 650 mg by mouth every 6 (six) hours as needed (for pain.).   albuterol (2.5 MG/3ML) 0.083% nebulizer solution Commonly known as: PROVENTIL Take 3 mLs (2.5 mg total) by nebulization every 6 (six) hours as needed for wheezing or shortness of breath (dx: J43.9).   albuterol 108 (90 Base) MCG/ACT inhaler Commonly known as: ProAir HFA INHALE 2 PUFFS INTO THE LUNGS EVERY 6 HOURS AS NEEDED FOR WHEEZING OR SHORTNESS OF BREATH   ALPRAZolam 0.5 MG tablet Commonly known as: XANAX Take 0.5 mg by mouth 3 (three) times daily as needed for sleep or anxiety.   Benicar 20 MG tablet Generic drug: olmesartan Take 20 mg by mouth daily.   diclofenac sodium 1 % Gel Commonly known as: VOLTAREN Apply 2-4 g topically 4 (four) times daily as needed for pain.   empagliflozin 25 MG Tabs tablet Commonly  known as: JARDIANCE Take 25 mg by mouth daily.   fluconazole 100 MG tablet Commonly known as: DIFLUCAN Take 1 tablet orally every other day   furosemide 40 MG tablet Commonly known as: LASIX Take 40 mg by mouth daily as needed for fluid. Fluid   gabapentin 600 MG tablet Commonly known as: NEURONTIN Take 600 mg by mouth at bedtime.   HumaLOG KwikPen 100 UNIT/ML KwikPen Generic drug: insulin lispro Inject 8-14 Units into the skin See admin instructions. Inject 8 units subcutaneously in the morning & inject 14 units subcutaneously at supper   hydrOXYzine 25 MG tablet Commonly known as: ATARAX/VISTARIL Take 25 mg by mouth 3 (three) times daily as needed (Neuropathic itch).   Lantus SoloStar 100 UNIT/ML Solostar Pen Generic drug: insulin glargine Inject into the skin 2 (two) times a day. Inject 50 units under  the skin in the morning and 60 units in the evening.   loratadine 10 MG tablet Commonly known as: CLARITIN Take 10 mg by mouth daily.   Lubricant Eye Drops 0.5 % Soln Generic drug: carboxymethylcellulose 1 drop 3 (three) times daily as needed.   metFORMIN 500 MG 24 hr tablet Commonly known as: GLUCOPHAGE-XR Take 2,000 mg by mouth daily.   methotrexate 2.5 MG tablet Commonly known as: RHEUMATREX Take 2.5 mg by mouth once a week. Caution:Chemotherapy. Protect from light.   montelukast 10 MG tablet Commonly known as: SINGULAIR TAKE 1 TABLET AT BEDTIME   omeprazole 40 MG capsule Commonly known as: PRILOSEC TAKE 1 CAPSULE EVERY MORNING   REMICADE IV Inject 1 Dose into the vein every 6 (six) weeks.   sertraline 50 MG tablet Commonly known as: ZOLOFT Take 50 mg by mouth daily.   simvastatin 40 MG tablet Commonly known as: ZOCOR Take 40 mg by mouth daily with supper.   Trelegy Ellipta 100-62.5-25 MCG/INH Aepb Generic drug: Fluticasone-Umeclidin-Vilant Inhale 1 puff into the lungs daily.   Trulicity 1.94 FX/2.5IV Sopn Generic drug: Dulaglutide SMARTSIG:0.5  Milliliter(s) SUB-Q Once a Week        Signature:  [name], MD [place] Pager - [phone] 09/18/2020, 10:27 AM

## 2020-09-18 NOTE — Addendum Note (Signed)
Addended by: Merrilee Seashore on: 09/18/2020 11:43 AM   Modules accepted: Orders

## 2020-09-18 NOTE — Patient Instructions (Signed)
Will arrange for flutter valve.  Use this after using your inhalers to help loosen phlegm.  Will call you with results of your CPAP download  Follow up in 6 months

## 2020-09-18 NOTE — Telephone Encounter (Signed)
CPAP 08/19/20 to 09/17/20 >> used on 30 of 30 nights with average 7 hrs 36 min.  Average AHI 2.9 with CPAP 6 cm H2O.   Please let her know her CPAP report shows good control with current settings.

## 2020-09-28 DIAGNOSIS — L97512 Non-pressure chronic ulcer of other part of right foot with fat layer exposed: Secondary | ICD-10-CM | POA: Diagnosis not present

## 2020-09-28 DIAGNOSIS — E1142 Type 2 diabetes mellitus with diabetic polyneuropathy: Secondary | ICD-10-CM | POA: Diagnosis not present

## 2020-09-29 DIAGNOSIS — H43813 Vitreous degeneration, bilateral: Secondary | ICD-10-CM | POA: Diagnosis not present

## 2020-09-29 DIAGNOSIS — H35033 Hypertensive retinopathy, bilateral: Secondary | ICD-10-CM | POA: Diagnosis not present

## 2020-09-29 DIAGNOSIS — H353123 Nonexudative age-related macular degeneration, left eye, advanced atrophic without subfoveal involvement: Secondary | ICD-10-CM | POA: Diagnosis not present

## 2020-09-29 DIAGNOSIS — H353112 Nonexudative age-related macular degeneration, right eye, intermediate dry stage: Secondary | ICD-10-CM | POA: Diagnosis not present

## 2020-10-06 DIAGNOSIS — M0589 Other rheumatoid arthritis with rheumatoid factor of multiple sites: Secondary | ICD-10-CM | POA: Diagnosis not present

## 2020-10-09 ENCOUNTER — Ambulatory Visit (INDEPENDENT_AMBULATORY_CARE_PROVIDER_SITE_OTHER): Payer: Medicare Other | Admitting: Obstetrics & Gynecology

## 2020-10-09 ENCOUNTER — Encounter: Payer: Self-pay | Admitting: Obstetrics & Gynecology

## 2020-10-09 ENCOUNTER — Other Ambulatory Visit: Payer: Self-pay

## 2020-10-09 VITALS — BP 105/61 | HR 81 | Ht 62.0 in | Wt 263.5 lb

## 2020-10-09 DIAGNOSIS — B373 Candidiasis of vulva and vagina: Secondary | ICD-10-CM

## 2020-10-09 DIAGNOSIS — B3731 Acute candidiasis of vulva and vagina: Secondary | ICD-10-CM

## 2020-10-09 MED ORDER — GERHARDT'S BUTT CREAM: 1.0000 "application " | TOPICAL_CREAM | Freq: Two times a day (BID) | CUTANEOUS | 99 refills | Status: DC

## 2020-10-09 NOTE — Progress Notes (Signed)
Chief Complaint  Patient presents with   Follow-up    Pt is 90% better!!!       70 y.o. G2P2 No LMP recorded. Patient has had a hysterectomy. The current method of family planning is hysterectomy.  Outpatient Encounter Medications as of 10/09/2020  Medication Sig Note   acetaminophen (TYLENOL) 325 MG tablet Take 650 mg by mouth every 6 (six) hours as needed (for pain.).    albuterol (PROVENTIL) (2.5 MG/3ML) 0.083% nebulizer solution Take 3 mLs (2.5 mg total) by nebulization every 6 (six) hours as needed for wheezing or shortness of breath (dx: J43.9).    ALPRAZolam (XANAX) 0.5 MG tablet Take 0.5 mg by mouth 3 (three) times daily as needed for sleep or anxiety.     BENICAR 20 MG tablet Take 20 mg by mouth daily.     carboxymethylcellulose (LUBRICANT EYE DROPS) 0.5 % SOLN 1 drop 3 (three) times daily as needed.    diclofenac sodium (VOLTAREN) 1 % GEL Apply 2-4 g topically 4 (four) times daily as needed for pain. 10/22/2018: On hand   empagliflozin (JARDIANCE) 25 MG TABS tablet Take 25 mg by mouth daily.    Ferrous Sulfate (IRON PO) Take by mouth.    Fluticasone-Umeclidin-Vilant (TRELEGY ELLIPTA) 100-62.5-25 MCG/INH AEPB Inhale 1 puff into the lungs daily.    furosemide (LASIX) 40 MG tablet Take 40 mg by mouth daily as needed for fluid. Fluid    gabapentin (NEURONTIN) 600 MG tablet Take 600 mg by mouth at bedtime.    HUMALOG KWIKPEN 100 UNIT/ML KwikPen Inject 8-14 Units into the skin See admin instructions. Inject 8 units subcutaneously in the morning & inject 14 units subcutaneously at supper    hydrOXYzine (ATARAX/VISTARIL) 25 MG tablet Take 25 mg by mouth 3 (three) times daily as needed (Neuropathic itch).    InFLIXimab (REMICADE IV) Inject 1 Dose into the vein every 6 (six) weeks.  10/22/2018: Next dose due:10/24/2018   LANTUS SOLOSTAR 100 UNIT/ML Solostar Pen Inject into the skin 2 (two) times a day. Inject 50 units under the skin in the morning and 60 units in the evening.     loratadine (CLARITIN) 10 MG tablet Take 10 mg by mouth daily.    metFORMIN (GLUCOPHAGE-XR) 500 MG 24 hr tablet Take 2,000 mg by mouth daily.    methotrexate (RHEUMATREX) 2.5 MG tablet Take 2.5 mg by mouth once a week. Caution:Chemotherapy. Protect from light.    montelukast (SINGULAIR) 10 MG tablet TAKE 1 TABLET AT BEDTIME    Nystatin (GERHARDT'S BUTT CREAM) CREA Apply 1 application topically 2 (two) times daily.    omeprazole (PRILOSEC) 40 MG capsule TAKE 1 CAPSULE EVERY MORNING    sertraline (ZOLOFT) 50 MG tablet Take 50 mg by mouth daily.     simvastatin (ZOCOR) 40 MG tablet Take 40 mg by mouth daily with supper.    TRULICITY 4.09 WJ/1.9JY SOPN SMARTSIG:0.5 Milliliter(s) SUB-Q Once a Week    [DISCONTINUED] albuterol (PROAIR HFA) 108 (90 Base) MCG/ACT inhaler INHALE 2 PUFFS INTO THE LUNGS EVERY 6 HOURS AS NEEDED FOR WHEEZING OR SHORTNESS OF BREATH    [DISCONTINUED] fluconazole (DIFLUCAN) 100 MG tablet Take 1 tablet orally every other day    No facility-administered encounter medications on file as of 10/09/2020.    Subjective Patient is seen back in for follow-up I saw her previously on 09/10/2020 Felt she had a yeast vulvovaginitis potentially on top of lichen sclerosis She is taken Diflucan orally as well as topical zinc  oxide steroid and antifungal nystatin with excellent results Spontaneously she states 90% better Past Medical History:  Diagnosis Date   Asthma    COPD (chronic obstructive pulmonary disease) ([place])    Depression    DM type 1 (diabetes mellitus, type 1) ([facility])    GERD (gastroesophageal reflux disease)    Hyperlipidemia    Hypertension    Morbid obesity ([place])    Obstructive sleep apnea syndrome    Osteoarthritis of right knee 04/2010   end-stage   PONV (postoperative nausea and vomiting)    Rheumatoid arthritis(714.0)    on methotrexate therapy   Septic arthritis of knee, right ([place]) 04/2010    Past Surgical History:  Procedure Laterality Date   ABDOMINAL  HYSTERECTOMY  1992   ABDOMINAL WALL MESH  REMOVAL  08/13/2009   with removal of retained stitches   [place]   for cardiac tamponade   APPENDECTOMY  1967   BIOPSY  10/29/2018   Procedure: BIOPSY;  Surgeon: Doran Stabler, MD;  Location: [facility];  Service: Gastroenterology;;   BONE BIOPSY Right 04/19/2016   Procedure: BONE BIOPSY RIGHT GREAT TOE;  Surgeon: Caprice Beaver, DPM;  Location: [facility];  Service: Podiatry;  Laterality: Right;   CHOLECYSTECTOMY  1992   laparoscopic   COLONOSCOPY  12/08/2011   Procedure: COLONOSCOPY;  Surgeon: Rogene Houston, MD;  Location: [facility];  Service: Endoscopy;  Laterality: N/A;  830   COLONOSCOPY WITH PROPOFOL N/A 10/16/2017   Procedure: COLONOSCOPY WITH PROPOFOL;  Surgeon: Doran Stabler, MD;  Location: [facility];  Service: Gastroenterology;  Laterality: N/A;   COLONOSCOPY WITH PROPOFOL N/A 10/29/2018   Procedure: COLONOSCOPY WITH PROPOFOL;  Surgeon: Doran Stabler, MD;  Location: [facility];  Service: Gastroenterology;  Laterality: N/A;   ESOPHAGOGASTRODUODENOSCOPY (EGD) WITH PROPOFOL N/A 10/29/2018   Procedure: ESOPHAGOGASTRODUODENOSCOPY (EGD) WITH PROPOFOL;  Surgeon: Doran Stabler, MD;  Location: [facility];  Service: Gastroenterology;  Laterality: N/A;   FOREIGN BODY REMOVAL ABDOMINAL  01/04/2010   stitch abscesses   HERNIA REPAIR  2001   open LOA / [place] repair w mesh.  Dr. Arnoldo Morale   HERNIA REPAIR  2007   open redo LOA / [place] repair w mesh.  Dr. Arnoldo Morale   POLYPECTOMY  10/16/2017   Procedure: POLYPECTOMY;  Surgeon: Doran Stabler, MD;  Location: [facility];  Service: Gastroenterology;;   POLYPECTOMY  10/29/2018   Procedure: POLYPECTOMY;  Surgeon: Doran Stabler, MD;  Location: [facility];  Service: Gastroenterology;;   REVISION TOTAL KNEE ARTHROPLASTY  06/09/2010   I and D    TOTAL KNEE ARTHROPLASTY  05/07/2010   right knee    OB History     Gravida  2   Para  2   Term       Preterm      AB      Living  2      SAB      IAB      Ectopic      Multiple      Live Births  2           Allergies  Allergen Reactions   Demerol Nausea And Vomiting    Severe   Hydromorphone Hcl Itching    All over the body.   Tetracycline Hives    Social History   Socioeconomic History   Marital status: Widowed    Spouse name: Not on file  Number of children: Not on file   Years of education: Not on file   Highest education level: Not on file  Occupational History   Not on file  Tobacco Use   Smoking status: Former    Packs/day: 2.00    Years: 25.00    Pack years: 50.00    Types: Cigarettes    Quit date: 04/11/1992    Years since quitting: 28.5   Smokeless tobacco: Never  Vaping Use   Vaping Use: Never used  Substance and Sexual Activity   Alcohol use: No   Drug use: No   Sexual activity: Yes    Partners: Male    Birth control/protection: Surgical  Other Topics Concern   Not on file  Social History Narrative   Not on file   Social Determinants of Health   Financial Resource Strain: Low Risk    Difficulty of Paying Living Expenses: Not hard at all  Food Insecurity: No Food Insecurity   Worried About Charity fundraiser in the Last Year: Never true   Ran Out of Food in the Last Year: Never true  Transportation Needs: No Transportation Needs   Lack of Transportation (Medical): No   Lack of Transportation (Non-Medical): No  Physical Activity: Inactive   Days of Exercise per Week: 0 days   Minutes of Exercise per Session: 0 min  Stress: Stress Concern Present   Feeling of Stress : To some extent  Social Connections: Unknown   Frequency of Communication with Friends and Family: More than three times a week   Frequency of Social Gatherings with Friends and Family: Twice a week   Attends Religious Services: Not on Electrical engineer or Organizations: No   Attends Archivist Meetings: 1 to 4 times per year   Marital  Status: Widowed    Family History  Adopted: Yes  Problem Relation Age of Onset   Cancer Mother    Hyperlipidemia Mother    Diabetes Mother    Thyroid disease Mother    Diabetes Father    COPD Father    Arthritis Father        RA   Hyperlipidemia Brother    Hypertension Brother     Medications:       Current Outpatient Medications:    acetaminophen (TYLENOL) 325 MG tablet, Take 650 mg by mouth every 6 (six) hours as needed (for pain.)., Disp: , Rfl:    albuterol (PROVENTIL) (2.5 MG/3ML) 0.083% nebulizer solution, Take 3 mLs (2.5 mg total) by nebulization every 6 (six) hours as needed for wheezing or shortness of breath (dx: J43.9)., Disp: 360 mL, Rfl: 5   ALPRAZolam (XANAX) 0.5 MG tablet, Take 0.5 mg by mouth 3 (three) times daily as needed for sleep or anxiety. , Disp: , Rfl:    BENICAR 20 MG tablet, Take 20 mg by mouth daily. , Disp: , Rfl:    carboxymethylcellulose (LUBRICANT EYE DROPS) 0.5 % SOLN, 1 drop 3 (three) times daily as needed., Disp: , Rfl:    diclofenac sodium (VOLTAREN) 1 % GEL, Apply 2-4 g topically 4 (four) times daily as needed for pain., Disp: , Rfl:    empagliflozin (JARDIANCE) 25 MG TABS tablet, Take 25 mg by mouth daily., Disp: , Rfl:    Ferrous Sulfate (IRON PO), Take by mouth., Disp: , Rfl:    Fluticasone-Umeclidin-Vilant (TRELEGY ELLIPTA) 100-62.5-25 MCG/INH AEPB, Inhale 1 puff into the lungs daily., Disp: 180 each, Rfl: 3   furosemide (  LASIX) 40 MG tablet, Take 40 mg by mouth daily as needed for fluid. Fluid, Disp: , Rfl:    gabapentin (NEURONTIN) 600 MG tablet, Take 600 mg by mouth at bedtime., Disp: , Rfl:    HUMALOG KWIKPEN 100 UNIT/ML KwikPen, Inject 8-14 Units into the skin See admin instructions. Inject 8 units subcutaneously in the morning & inject 14 units subcutaneously at supper, Disp: , Rfl:    hydrOXYzine (ATARAX/VISTARIL) 25 MG tablet, Take 25 mg by mouth 3 (three) times daily as needed (Neuropathic itch)., Disp: , Rfl:    InFLIXimab (REMICADE  IV), Inject 1 Dose into the vein every 6 (six) weeks. , Disp: , Rfl:    LANTUS SOLOSTAR 100 UNIT/ML Solostar Pen, Inject into the skin 2 (two) times a day. Inject 50 units under the skin in the morning and 60 units in the evening., Disp: , Rfl:    loratadine (CLARITIN) 10 MG tablet, Take 10 mg by mouth daily., Disp: , Rfl:    metFORMIN (GLUCOPHAGE-XR) 500 MG 24 hr tablet, Take 2,000 mg by mouth daily., Disp: , Rfl:    methotrexate (RHEUMATREX) 2.5 MG tablet, Take 2.5 mg by mouth once a week. Caution:Chemotherapy. Protect from light., Disp: , Rfl:    montelukast (SINGULAIR) 10 MG tablet, TAKE 1 TABLET AT BEDTIME, Disp: 90 tablet, Rfl: 3   Nystatin (GERHARDT'S BUTT CREAM) CREA, Apply 1 application topically 2 (two) times daily., Disp: 1 each, Rfl: PRN   omeprazole (PRILOSEC) 40 MG capsule, TAKE 1 CAPSULE EVERY MORNING, Disp: 90 capsule, Rfl: 1   sertraline (ZOLOFT) 50 MG tablet, Take 50 mg by mouth daily. , Disp: , Rfl:    simvastatin (ZOCOR) 40 MG tablet, Take 40 mg by mouth daily with supper., Disp: , Rfl:    TRULICITY 7.56 EP/3.2RJ SOPN, SMARTSIG:0.5 Milliliter(s) SUB-Q Once a Week, Disp: , Rfl:    albuterol (PROAIR HFA) 108 (90 Base) MCG/ACT inhaler, INHALE 2 PUFFS INTO THE LUNGS EVERY 6 HOURS AS NEEDED FOR WHEEZING OR SHORTNESS OF BREATH, Disp: 3 each, Rfl: 3  Objective Blood pressure 105/61, pulse 81, height 5\' 2"  (1.575 m), weight 263 lb 8 oz (119.5 kg).  Vulvovaginal exam reveals marked improvement in the erythema excoriations Overall much improved No evidence of lichen sclerosis today Treated with gentian violet again  Pertinent ROS No burning with urination, frequency or urgency No nausea, vomiting or diarrhea Nor fever chills or other constitutional symptoms   Labs or studies None    Impression Diagnoses this Encounter::   ICD-10-CM   1. Vulvovaginitis due to yeast:  largely resolved on diflucan course  B37.3    retreated today with GV, use fanny cream chronically       Established relevant diagnosis(es): Not applicable  Plan/Recommendations: Meds ordered this encounter  Medications   Nystatin (GERHARDT'S BUTT CREAM) CREA    Sig: Apply 1 application topically 2 (two) times daily.    Dispense:  1 each    Refill:  PRN    You can use triamcinolone if preferred    Labs or Scans Ordered: No orders of the defined types were placed in this encounter.   Management:: Continue topical zinc oxide steroid and antifungal agent  Follow up Return if symptoms worsen or fail to improve.         All questions were answered.

## 2020-10-20 DIAGNOSIS — L97512 Non-pressure chronic ulcer of other part of right foot with fat layer exposed: Secondary | ICD-10-CM | POA: Diagnosis not present

## 2020-10-20 DIAGNOSIS — E1142 Type 2 diabetes mellitus with diabetic polyneuropathy: Secondary | ICD-10-CM | POA: Diagnosis not present

## 2020-10-31 IMAGING — CT CT CHEST HIGH RESOLUTION W/O CM
3 of 5 series · 14 of 36 positions shown, 15 images · non-contrast
Comparison: 11/29/2017.

CLINICAL DATA: Chronic cough, evaluate for interstitial lung
disease.

EXAM:
CT CHEST WITHOUT CONTRAST
TECHNIQUE: Multidetector CT imaging of the chest was performed following the
standard protocol without intravenous contrast. High resolution
imaging of the lungs, as well as inspiratory and expiratory imaging,
was performed.

[Series 3: standard chest · axial · 0.59mm/px · z∈[-431,-181]mm · 8 of 155 slices shown]
[im 15/155  mediastinal]
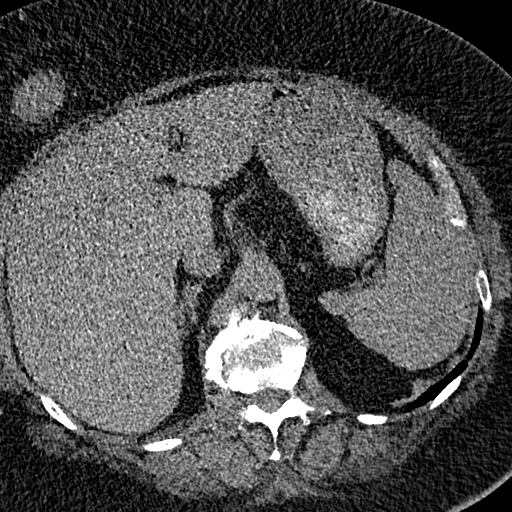
[im 30/155  mediastinal]
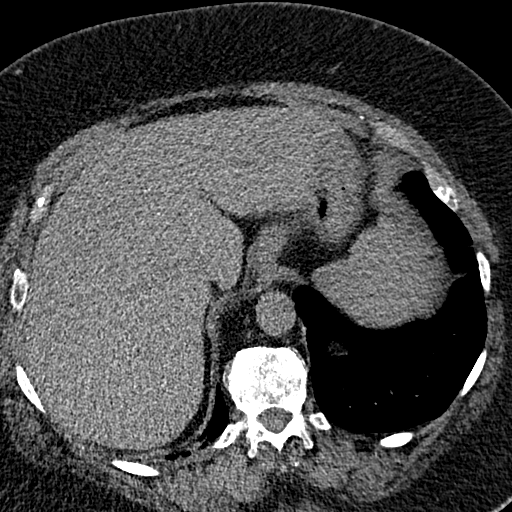
[im 52/155  mediastinal]
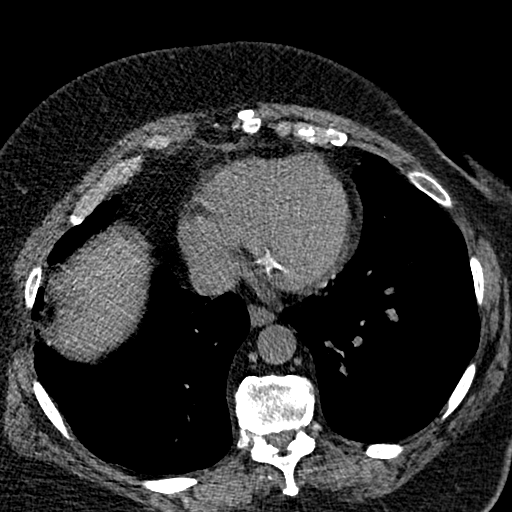
[im 67/155  mediastinal]
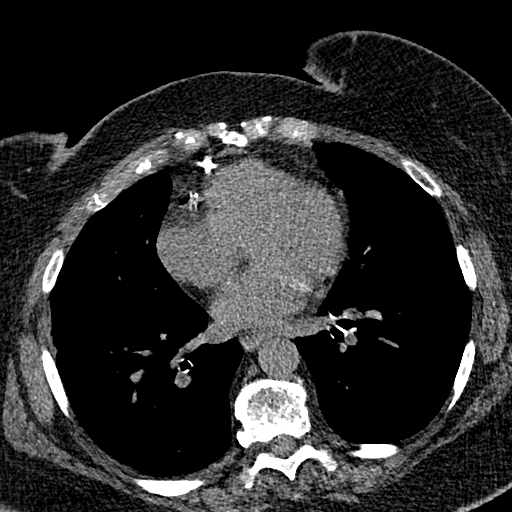
[im 89/155  mediastinal]
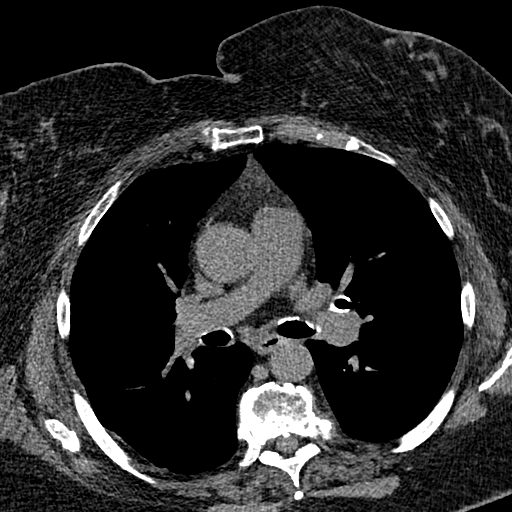
[im 103/155  mediastinal]
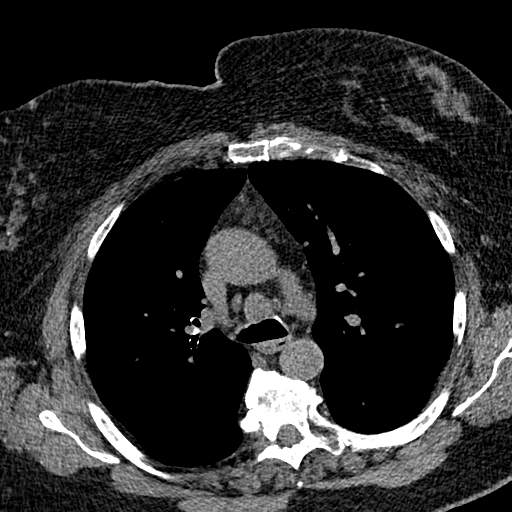
[im 125/155  mediastinal]
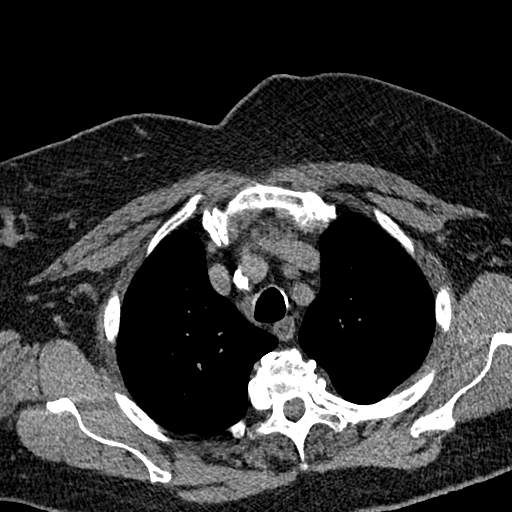
[im 140/155  mediastinal]
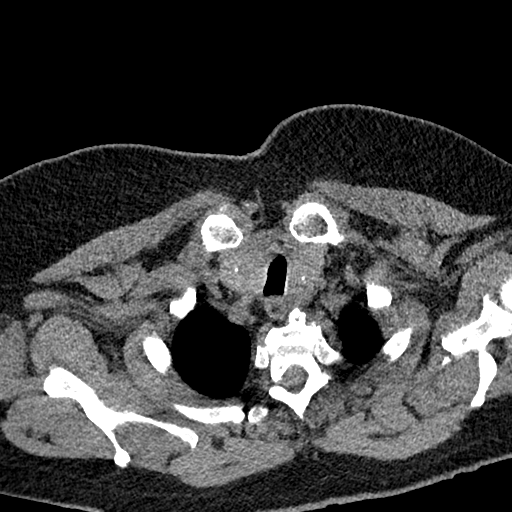

[Series 5: high res exp · axial · 0.61mm/px · z∈[-451,-150]mm · 3 of 22 slices shown, 4 images]
[im 1/22  mediastinal]
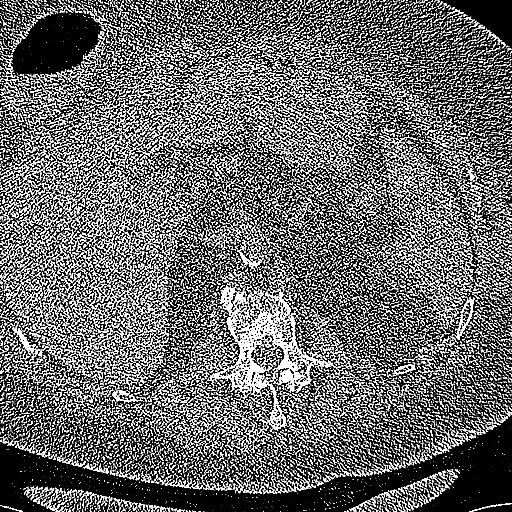
[im 1/22  lung]
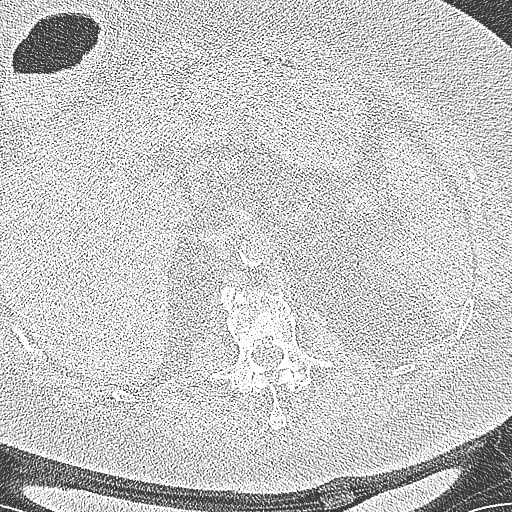
[im 11/22  lung]
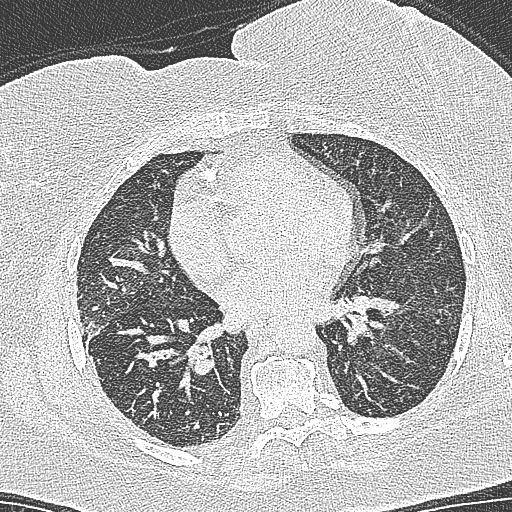
[im 22/22  lung]
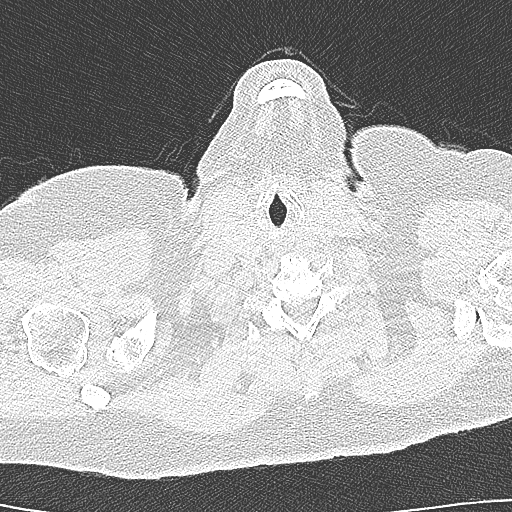

[Series 9: coronal · coronal · 0.65mm/px · 3 of 151 slices shown]
[im 31/151  lung]
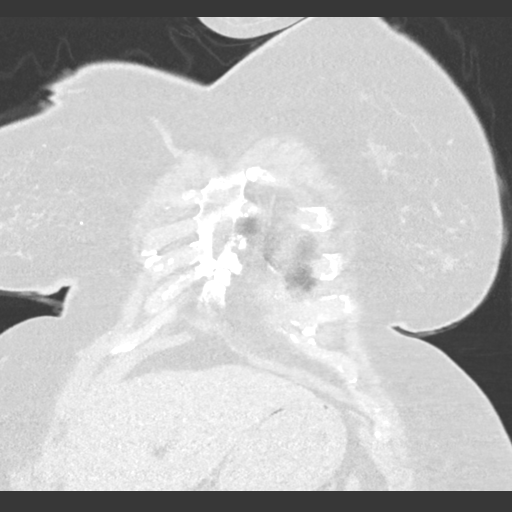
[im 61/151  lung]
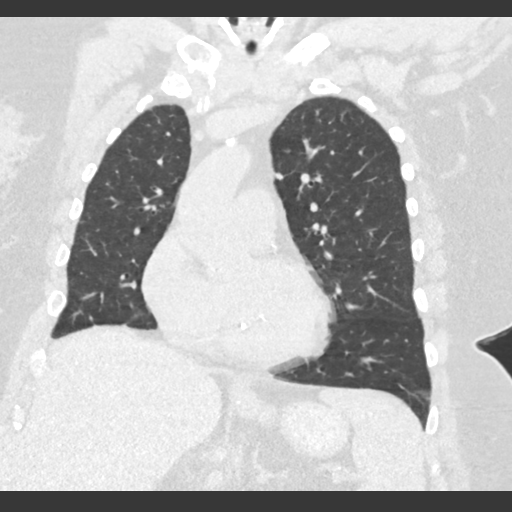
[im 91/151  lung]
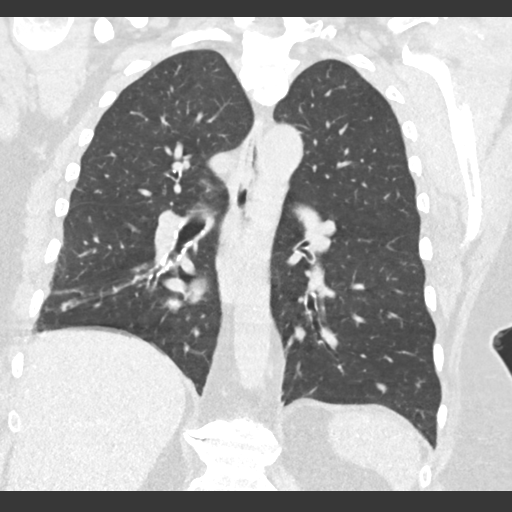

[14 of 36 positions shown; findings below may reference images not displayed]

FINDINGS: Cardiovascular: Atherosclerotic calcification of the aorta, aortic
valve and coronary arteries. Pulmonic trunk is enlarged. Heart size
normal. No pericardial effusion.

Mediastinum/Nodes: Thyroid is enlarged and heterogeneous. Largest
measurable low-attenuation nodule is seen on the left, 8 mm. No
follow-up recommended (ref: [HOSPITAL]. [DATE]):
143-50).Thymic tissue is seen in the prevascular space. 1.5 cm low
right paratracheal lymph node is unchanged. Hilar regions are
difficult to evaluate without IV contrast. No axillary adenopathy.
Esophagus is unremarkable.

Lungs/Pleura: Areas of mild basilar subpleural scarring. No
definitive subpleural reticulation, traction
bronchiectasis/bronchiolectasis or architectural distortion.
Ground-glass nodule in the left upper lobe has enlarged slightly,
now measuring 1.1 x 1.4 cm (7/41), compared to 7 x 8 mm on
11/29/2017. No solid component. No pleural fluid. Airway is
unremarkable. No air trapping.

Upper Abdomen: Visualized portions of the liver, adrenal glands,
spleen, pancreas and stomach are grossly unremarkable. Partially
imaged right sided ventral hernia.

Musculoskeletal: Degenerative changes in the spine. No worrisome
lytic or sclerotic lesions.
IMPRESSION: 1. No evidence of interstitial lung disease.
2. Enlarging ground-glass nodule in the left upper lobe. Follow-up
CT chest without contrast in 1 year is recommended as adenocarcinoma
cannot be excluded.
3. Aortic atherosclerosis ([id number]). Coronary artery
calcification.
4. Enlarged pulmonic trunk, indicative of pulmonary arterial
hypertension.

## 2020-11-03 DIAGNOSIS — M869 Osteomyelitis, unspecified: Secondary | ICD-10-CM | POA: Diagnosis not present

## 2020-11-06 ENCOUNTER — Telehealth: Payer: Self-pay | Admitting: Pulmonary Disease

## 2020-11-06 MED ORDER — ALBUTEROL SULFATE HFA 108 (90 BASE) MCG/ACT IN AERS: INHALATION_SPRAY | RESPIRATORY_TRACT | 3 refills | Status: DC

## 2020-11-06 NOTE — Telephone Encounter (Signed)
Called and spoke with patient to confirm that she needs refills of her rescue inhaler. She verified pharmacy. RX has been sent in. Nothing further needed at this time.

## 2020-11-06 NOTE — Telephone Encounter (Signed)
Pt stated that she needs a new prescription for her Albuterol metered dose inhaler to be sent for a 90 day (3 month supply) to [facility].  Pharmacy; [place], [place] regard; [phone]

## 2020-11-08 ENCOUNTER — Encounter: Payer: Self-pay | Admitting: Obstetrics & Gynecology

## 2020-11-16 DIAGNOSIS — E1142 Type 2 diabetes mellitus with diabetic polyneuropathy: Secondary | ICD-10-CM | POA: Diagnosis not present

## 2020-11-16 DIAGNOSIS — M869 Osteomyelitis, unspecified: Secondary | ICD-10-CM | POA: Diagnosis not present

## 2020-11-16 DIAGNOSIS — L97512 Non-pressure chronic ulcer of other part of right foot with fat layer exposed: Secondary | ICD-10-CM | POA: Diagnosis not present

## 2020-11-17 DIAGNOSIS — E1129 Type 2 diabetes mellitus with other diabetic kidney complication: Secondary | ICD-10-CM | POA: Diagnosis not present

## 2020-11-17 DIAGNOSIS — D509 Iron deficiency anemia, unspecified: Secondary | ICD-10-CM | POA: Diagnosis not present

## 2020-11-17 DIAGNOSIS — Z79899 Other long term (current) drug therapy: Secondary | ICD-10-CM | POA: Diagnosis not present

## 2020-11-18 DIAGNOSIS — M0589 Other rheumatoid arthritis with rheumatoid factor of multiple sites: Secondary | ICD-10-CM | POA: Diagnosis not present

## 2020-11-18 DIAGNOSIS — Z79899 Other long term (current) drug therapy: Secondary | ICD-10-CM | POA: Diagnosis not present

## 2020-11-18 DIAGNOSIS — R5383 Other fatigue: Secondary | ICD-10-CM | POA: Diagnosis not present

## 2020-11-18 DIAGNOSIS — Z111 Encounter for screening for respiratory tuberculosis: Secondary | ICD-10-CM | POA: Diagnosis not present

## 2020-11-23 ENCOUNTER — Other Ambulatory Visit: Payer: Self-pay | Admitting: Pulmonary Disease

## 2020-11-23 DIAGNOSIS — B351 Tinea unguium: Secondary | ICD-10-CM | POA: Diagnosis not present

## 2020-11-23 DIAGNOSIS — L851 Acquired keratosis [keratoderma] palmaris et plantaris: Secondary | ICD-10-CM | POA: Diagnosis not present

## 2020-11-23 DIAGNOSIS — L97512 Non-pressure chronic ulcer of other part of right foot with fat layer exposed: Secondary | ICD-10-CM | POA: Diagnosis not present

## 2020-11-23 DIAGNOSIS — E1342 Other specified diabetes mellitus with diabetic polyneuropathy: Secondary | ICD-10-CM | POA: Diagnosis not present

## 2020-11-24 DIAGNOSIS — I10 Essential (primary) hypertension: Secondary | ICD-10-CM | POA: Diagnosis not present

## 2020-11-24 DIAGNOSIS — M069 Rheumatoid arthritis, unspecified: Secondary | ICD-10-CM | POA: Diagnosis not present

## 2020-11-24 DIAGNOSIS — M86171 Other acute osteomyelitis, right ankle and foot: Secondary | ICD-10-CM | POA: Diagnosis not present

## 2020-11-24 DIAGNOSIS — E1122 Type 2 diabetes mellitus with diabetic chronic kidney disease: Secondary | ICD-10-CM | POA: Diagnosis not present

## 2020-11-24 DIAGNOSIS — D509 Iron deficiency anemia, unspecified: Secondary | ICD-10-CM | POA: Diagnosis not present

## 2020-11-24 DIAGNOSIS — R7309 Other abnormal glucose: Secondary | ICD-10-CM | POA: Diagnosis not present

## 2020-11-24 MED ORDER — TRELEGY ELLIPTA 100-62.5-25 MCG/INH IN AEPB: 1.0000 | INHALATION_SPRAY | Freq: Every day | RESPIRATORY_TRACT | 3 refills | Status: AC

## 2020-11-25 DIAGNOSIS — M869 Osteomyelitis, unspecified: Secondary | ICD-10-CM | POA: Diagnosis not present

## 2020-11-27 DIAGNOSIS — M868X7 Other osteomyelitis, ankle and foot: Secondary | ICD-10-CM | POA: Diagnosis not present

## 2020-11-28 DIAGNOSIS — M868X7 Other osteomyelitis, ankle and foot: Secondary | ICD-10-CM | POA: Diagnosis not present

## 2020-11-29 DIAGNOSIS — M868X7 Other osteomyelitis, ankle and foot: Secondary | ICD-10-CM | POA: Diagnosis not present

## 2020-11-30 DIAGNOSIS — M868X7 Other osteomyelitis, ankle and foot: Secondary | ICD-10-CM | POA: Diagnosis not present

## 2020-12-01 DIAGNOSIS — M868X7 Other osteomyelitis, ankle and foot: Secondary | ICD-10-CM | POA: Diagnosis not present

## 2020-12-02 DIAGNOSIS — M868X7 Other osteomyelitis, ankle and foot: Secondary | ICD-10-CM | POA: Diagnosis not present

## 2020-12-03 DIAGNOSIS — M868X7 Other osteomyelitis, ankle and foot: Secondary | ICD-10-CM | POA: Diagnosis not present

## 2020-12-04 DIAGNOSIS — M868X7 Other osteomyelitis, ankle and foot: Secondary | ICD-10-CM | POA: Diagnosis not present

## 2020-12-05 DIAGNOSIS — M868X7 Other osteomyelitis, ankle and foot: Secondary | ICD-10-CM | POA: Diagnosis not present

## 2020-12-06 DIAGNOSIS — M868X7 Other osteomyelitis, ankle and foot: Secondary | ICD-10-CM | POA: Diagnosis not present

## 2020-12-07 DIAGNOSIS — L97512 Non-pressure chronic ulcer of other part of right foot with fat layer exposed: Secondary | ICD-10-CM | POA: Diagnosis not present

## 2020-12-07 DIAGNOSIS — M868X7 Other osteomyelitis, ankle and foot: Secondary | ICD-10-CM | POA: Diagnosis not present

## 2020-12-07 DIAGNOSIS — E1342 Other specified diabetes mellitus with diabetic polyneuropathy: Secondary | ICD-10-CM | POA: Diagnosis not present

## 2020-12-08 DIAGNOSIS — M868X7 Other osteomyelitis, ankle and foot: Secondary | ICD-10-CM | POA: Diagnosis not present

## 2020-12-09 DIAGNOSIS — M868X7 Other osteomyelitis, ankle and foot: Secondary | ICD-10-CM | POA: Diagnosis not present

## 2020-12-10 DIAGNOSIS — J45909 Unspecified asthma, uncomplicated: Secondary | ICD-10-CM | POA: Diagnosis not present

## 2020-12-10 DIAGNOSIS — Z6841 Body Mass Index (BMI) 40.0 and over, adult: Secondary | ICD-10-CM | POA: Diagnosis not present

## 2020-12-10 DIAGNOSIS — M86671 Other chronic osteomyelitis, right ankle and foot: Secondary | ICD-10-CM | POA: Diagnosis not present

## 2020-12-10 DIAGNOSIS — Z881 Allergy status to other antibiotic agents status: Secondary | ICD-10-CM | POA: Diagnosis not present

## 2020-12-10 DIAGNOSIS — M86271 Subacute osteomyelitis, right ankle and foot: Secondary | ICD-10-CM | POA: Diagnosis not present

## 2020-12-10 DIAGNOSIS — R700 Elevated erythrocyte sedimentation rate: Secondary | ICD-10-CM | POA: Diagnosis not present

## 2020-12-10 DIAGNOSIS — Z794 Long term (current) use of insulin: Secondary | ICD-10-CM | POA: Diagnosis not present

## 2020-12-10 DIAGNOSIS — L97512 Non-pressure chronic ulcer of other part of right foot with fat layer exposed: Secondary | ICD-10-CM | POA: Diagnosis not present

## 2020-12-10 DIAGNOSIS — Z833 Family history of diabetes mellitus: Secondary | ICD-10-CM | POA: Diagnosis not present

## 2020-12-10 DIAGNOSIS — Z87891 Personal history of nicotine dependence: Secondary | ICD-10-CM | POA: Diagnosis not present

## 2020-12-10 DIAGNOSIS — M868X7 Other osteomyelitis, ankle and foot: Secondary | ICD-10-CM | POA: Diagnosis not present

## 2020-12-10 DIAGNOSIS — I2510 Atherosclerotic heart disease of native coronary artery without angina pectoris: Secondary | ICD-10-CM | POA: Diagnosis not present

## 2020-12-10 DIAGNOSIS — I872 Venous insufficiency (chronic) (peripheral): Secondary | ICD-10-CM | POA: Diagnosis not present

## 2020-12-10 DIAGNOSIS — E1169 Type 2 diabetes mellitus with other specified complication: Secondary | ICD-10-CM | POA: Diagnosis not present

## 2020-12-10 DIAGNOSIS — E11621 Type 2 diabetes mellitus with foot ulcer: Secondary | ICD-10-CM | POA: Diagnosis not present

## 2020-12-10 DIAGNOSIS — Z885 Allergy status to narcotic agent status: Secondary | ICD-10-CM | POA: Diagnosis not present

## 2020-12-10 DIAGNOSIS — L97516 Non-pressure chronic ulcer of other part of right foot with bone involvement without evidence of necrosis: Secondary | ICD-10-CM | POA: Diagnosis not present

## 2020-12-11 DIAGNOSIS — M86671 Other chronic osteomyelitis, right ankle and foot: Secondary | ICD-10-CM | POA: Diagnosis not present

## 2020-12-11 DIAGNOSIS — Z794 Long term (current) use of insulin: Secondary | ICD-10-CM | POA: Diagnosis not present

## 2020-12-11 DIAGNOSIS — M868X7 Other osteomyelitis, ankle and foot: Secondary | ICD-10-CM | POA: Diagnosis not present

## 2020-12-11 DIAGNOSIS — L97512 Non-pressure chronic ulcer of other part of right foot with fat layer exposed: Secondary | ICD-10-CM | POA: Diagnosis not present

## 2020-12-11 DIAGNOSIS — E1169 Type 2 diabetes mellitus with other specified complication: Secondary | ICD-10-CM | POA: Diagnosis not present

## 2020-12-11 DIAGNOSIS — E11621 Type 2 diabetes mellitus with foot ulcer: Secondary | ICD-10-CM | POA: Diagnosis not present

## 2020-12-12 DIAGNOSIS — L97512 Non-pressure chronic ulcer of other part of right foot with fat layer exposed: Secondary | ICD-10-CM | POA: Diagnosis not present

## 2020-12-12 DIAGNOSIS — M86671 Other chronic osteomyelitis, right ankle and foot: Secondary | ICD-10-CM | POA: Diagnosis not present

## 2020-12-12 DIAGNOSIS — Z794 Long term (current) use of insulin: Secondary | ICD-10-CM | POA: Diagnosis not present

## 2020-12-12 DIAGNOSIS — M868X7 Other osteomyelitis, ankle and foot: Secondary | ICD-10-CM | POA: Diagnosis not present

## 2020-12-12 DIAGNOSIS — E1169 Type 2 diabetes mellitus with other specified complication: Secondary | ICD-10-CM | POA: Diagnosis not present

## 2020-12-12 DIAGNOSIS — E11621 Type 2 diabetes mellitus with foot ulcer: Secondary | ICD-10-CM | POA: Diagnosis not present

## 2020-12-13 DIAGNOSIS — M86671 Other chronic osteomyelitis, right ankle and foot: Secondary | ICD-10-CM | POA: Diagnosis not present

## 2020-12-13 DIAGNOSIS — Z794 Long term (current) use of insulin: Secondary | ICD-10-CM | POA: Diagnosis not present

## 2020-12-13 DIAGNOSIS — L97512 Non-pressure chronic ulcer of other part of right foot with fat layer exposed: Secondary | ICD-10-CM | POA: Diagnosis not present

## 2020-12-13 DIAGNOSIS — E11621 Type 2 diabetes mellitus with foot ulcer: Secondary | ICD-10-CM | POA: Diagnosis not present

## 2020-12-13 DIAGNOSIS — M868X7 Other osteomyelitis, ankle and foot: Secondary | ICD-10-CM | POA: Diagnosis not present

## 2020-12-13 DIAGNOSIS — E1169 Type 2 diabetes mellitus with other specified complication: Secondary | ICD-10-CM | POA: Diagnosis not present

## 2020-12-14 DIAGNOSIS — M86671 Other chronic osteomyelitis, right ankle and foot: Secondary | ICD-10-CM | POA: Diagnosis not present

## 2020-12-14 DIAGNOSIS — E11621 Type 2 diabetes mellitus with foot ulcer: Secondary | ICD-10-CM | POA: Diagnosis not present

## 2020-12-14 DIAGNOSIS — L97512 Non-pressure chronic ulcer of other part of right foot with fat layer exposed: Secondary | ICD-10-CM | POA: Diagnosis not present

## 2020-12-14 DIAGNOSIS — E1169 Type 2 diabetes mellitus with other specified complication: Secondary | ICD-10-CM | POA: Diagnosis not present

## 2020-12-14 DIAGNOSIS — Z794 Long term (current) use of insulin: Secondary | ICD-10-CM | POA: Diagnosis not present

## 2020-12-14 DIAGNOSIS — M868X7 Other osteomyelitis, ankle and foot: Secondary | ICD-10-CM | POA: Diagnosis not present

## 2020-12-15 DIAGNOSIS — M86671 Other chronic osteomyelitis, right ankle and foot: Secondary | ICD-10-CM | POA: Diagnosis not present

## 2020-12-15 DIAGNOSIS — E1169 Type 2 diabetes mellitus with other specified complication: Secondary | ICD-10-CM | POA: Diagnosis not present

## 2020-12-15 DIAGNOSIS — Z794 Long term (current) use of insulin: Secondary | ICD-10-CM | POA: Diagnosis not present

## 2020-12-15 DIAGNOSIS — E11621 Type 2 diabetes mellitus with foot ulcer: Secondary | ICD-10-CM | POA: Diagnosis not present

## 2020-12-15 DIAGNOSIS — L97512 Non-pressure chronic ulcer of other part of right foot with fat layer exposed: Secondary | ICD-10-CM | POA: Diagnosis not present

## 2020-12-15 DIAGNOSIS — M868X7 Other osteomyelitis, ankle and foot: Secondary | ICD-10-CM | POA: Diagnosis not present

## 2020-12-16 DIAGNOSIS — M86671 Other chronic osteomyelitis, right ankle and foot: Secondary | ICD-10-CM | POA: Diagnosis not present

## 2020-12-16 DIAGNOSIS — E11621 Type 2 diabetes mellitus with foot ulcer: Secondary | ICD-10-CM | POA: Diagnosis not present

## 2020-12-16 DIAGNOSIS — M868X7 Other osteomyelitis, ankle and foot: Secondary | ICD-10-CM | POA: Diagnosis not present

## 2020-12-16 DIAGNOSIS — E1169 Type 2 diabetes mellitus with other specified complication: Secondary | ICD-10-CM | POA: Diagnosis not present

## 2020-12-16 DIAGNOSIS — Z794 Long term (current) use of insulin: Secondary | ICD-10-CM | POA: Diagnosis not present

## 2020-12-16 DIAGNOSIS — L97512 Non-pressure chronic ulcer of other part of right foot with fat layer exposed: Secondary | ICD-10-CM | POA: Diagnosis not present

## 2020-12-17 DIAGNOSIS — E11621 Type 2 diabetes mellitus with foot ulcer: Secondary | ICD-10-CM | POA: Diagnosis not present

## 2020-12-17 DIAGNOSIS — L97512 Non-pressure chronic ulcer of other part of right foot with fat layer exposed: Secondary | ICD-10-CM | POA: Diagnosis not present

## 2020-12-17 DIAGNOSIS — E1169 Type 2 diabetes mellitus with other specified complication: Secondary | ICD-10-CM | POA: Diagnosis not present

## 2020-12-17 DIAGNOSIS — M86671 Other chronic osteomyelitis, right ankle and foot: Secondary | ICD-10-CM | POA: Diagnosis not present

## 2020-12-17 DIAGNOSIS — M868X7 Other osteomyelitis, ankle and foot: Secondary | ICD-10-CM | POA: Diagnosis not present

## 2020-12-17 DIAGNOSIS — Z794 Long term (current) use of insulin: Secondary | ICD-10-CM | POA: Diagnosis not present

## 2020-12-18 DIAGNOSIS — Z794 Long term (current) use of insulin: Secondary | ICD-10-CM | POA: Diagnosis not present

## 2020-12-18 DIAGNOSIS — E1169 Type 2 diabetes mellitus with other specified complication: Secondary | ICD-10-CM | POA: Diagnosis not present

## 2020-12-18 DIAGNOSIS — L97512 Non-pressure chronic ulcer of other part of right foot with fat layer exposed: Secondary | ICD-10-CM | POA: Diagnosis not present

## 2020-12-18 DIAGNOSIS — M868X7 Other osteomyelitis, ankle and foot: Secondary | ICD-10-CM | POA: Diagnosis not present

## 2020-12-18 DIAGNOSIS — E11621 Type 2 diabetes mellitus with foot ulcer: Secondary | ICD-10-CM | POA: Diagnosis not present

## 2020-12-18 DIAGNOSIS — M86671 Other chronic osteomyelitis, right ankle and foot: Secondary | ICD-10-CM | POA: Diagnosis not present

## 2020-12-19 DIAGNOSIS — E11621 Type 2 diabetes mellitus with foot ulcer: Secondary | ICD-10-CM | POA: Diagnosis not present

## 2020-12-19 DIAGNOSIS — M86671 Other chronic osteomyelitis, right ankle and foot: Secondary | ICD-10-CM | POA: Diagnosis not present

## 2020-12-19 DIAGNOSIS — Z794 Long term (current) use of insulin: Secondary | ICD-10-CM | POA: Diagnosis not present

## 2020-12-19 DIAGNOSIS — M868X7 Other osteomyelitis, ankle and foot: Secondary | ICD-10-CM | POA: Diagnosis not present

## 2020-12-19 DIAGNOSIS — L97512 Non-pressure chronic ulcer of other part of right foot with fat layer exposed: Secondary | ICD-10-CM | POA: Diagnosis not present

## 2020-12-19 DIAGNOSIS — E1169 Type 2 diabetes mellitus with other specified complication: Secondary | ICD-10-CM | POA: Diagnosis not present

## 2020-12-20 DIAGNOSIS — M868X7 Other osteomyelitis, ankle and foot: Secondary | ICD-10-CM | POA: Diagnosis not present

## 2020-12-20 DIAGNOSIS — E1169 Type 2 diabetes mellitus with other specified complication: Secondary | ICD-10-CM | POA: Diagnosis not present

## 2020-12-20 DIAGNOSIS — M86671 Other chronic osteomyelitis, right ankle and foot: Secondary | ICD-10-CM | POA: Diagnosis not present

## 2020-12-20 DIAGNOSIS — L97512 Non-pressure chronic ulcer of other part of right foot with fat layer exposed: Secondary | ICD-10-CM | POA: Diagnosis not present

## 2020-12-20 DIAGNOSIS — E11621 Type 2 diabetes mellitus with foot ulcer: Secondary | ICD-10-CM | POA: Diagnosis not present

## 2020-12-20 DIAGNOSIS — Z794 Long term (current) use of insulin: Secondary | ICD-10-CM | POA: Diagnosis not present

## 2020-12-21 DIAGNOSIS — M86671 Other chronic osteomyelitis, right ankle and foot: Secondary | ICD-10-CM | POA: Diagnosis not present

## 2020-12-21 DIAGNOSIS — M868X7 Other osteomyelitis, ankle and foot: Secondary | ICD-10-CM | POA: Diagnosis not present

## 2020-12-21 DIAGNOSIS — Z794 Long term (current) use of insulin: Secondary | ICD-10-CM | POA: Diagnosis not present

## 2020-12-21 DIAGNOSIS — E1342 Other specified diabetes mellitus with diabetic polyneuropathy: Secondary | ICD-10-CM | POA: Diagnosis not present

## 2020-12-21 DIAGNOSIS — L97512 Non-pressure chronic ulcer of other part of right foot with fat layer exposed: Secondary | ICD-10-CM | POA: Diagnosis not present

## 2020-12-21 DIAGNOSIS — E11621 Type 2 diabetes mellitus with foot ulcer: Secondary | ICD-10-CM | POA: Diagnosis not present

## 2020-12-21 DIAGNOSIS — E1169 Type 2 diabetes mellitus with other specified complication: Secondary | ICD-10-CM | POA: Diagnosis not present

## 2020-12-22 DIAGNOSIS — Z794 Long term (current) use of insulin: Secondary | ICD-10-CM | POA: Diagnosis not present

## 2020-12-22 DIAGNOSIS — M868X7 Other osteomyelitis, ankle and foot: Secondary | ICD-10-CM | POA: Diagnosis not present

## 2020-12-22 DIAGNOSIS — M86671 Other chronic osteomyelitis, right ankle and foot: Secondary | ICD-10-CM | POA: Diagnosis not present

## 2020-12-22 DIAGNOSIS — L97512 Non-pressure chronic ulcer of other part of right foot with fat layer exposed: Secondary | ICD-10-CM | POA: Diagnosis not present

## 2020-12-22 DIAGNOSIS — E11621 Type 2 diabetes mellitus with foot ulcer: Secondary | ICD-10-CM | POA: Diagnosis not present

## 2020-12-22 DIAGNOSIS — E1169 Type 2 diabetes mellitus with other specified complication: Secondary | ICD-10-CM | POA: Diagnosis not present

## 2020-12-23 DIAGNOSIS — Z794 Long term (current) use of insulin: Secondary | ICD-10-CM | POA: Diagnosis not present

## 2020-12-23 DIAGNOSIS — E11621 Type 2 diabetes mellitus with foot ulcer: Secondary | ICD-10-CM | POA: Diagnosis not present

## 2020-12-23 DIAGNOSIS — E1169 Type 2 diabetes mellitus with other specified complication: Secondary | ICD-10-CM | POA: Diagnosis not present

## 2020-12-23 DIAGNOSIS — M86671 Other chronic osteomyelitis, right ankle and foot: Secondary | ICD-10-CM | POA: Diagnosis not present

## 2020-12-23 DIAGNOSIS — M868X7 Other osteomyelitis, ankle and foot: Secondary | ICD-10-CM | POA: Diagnosis not present

## 2020-12-23 DIAGNOSIS — L97512 Non-pressure chronic ulcer of other part of right foot with fat layer exposed: Secondary | ICD-10-CM | POA: Diagnosis not present

## 2020-12-24 DIAGNOSIS — M868X7 Other osteomyelitis, ankle and foot: Secondary | ICD-10-CM | POA: Diagnosis not present

## 2020-12-24 DIAGNOSIS — M86671 Other chronic osteomyelitis, right ankle and foot: Secondary | ICD-10-CM | POA: Diagnosis not present

## 2020-12-24 DIAGNOSIS — L97512 Non-pressure chronic ulcer of other part of right foot with fat layer exposed: Secondary | ICD-10-CM | POA: Diagnosis not present

## 2020-12-24 DIAGNOSIS — E1169 Type 2 diabetes mellitus with other specified complication: Secondary | ICD-10-CM | POA: Diagnosis not present

## 2020-12-24 DIAGNOSIS — Z794 Long term (current) use of insulin: Secondary | ICD-10-CM | POA: Diagnosis not present

## 2020-12-24 DIAGNOSIS — E11621 Type 2 diabetes mellitus with foot ulcer: Secondary | ICD-10-CM | POA: Diagnosis not present

## 2020-12-25 DIAGNOSIS — Z794 Long term (current) use of insulin: Secondary | ICD-10-CM | POA: Diagnosis not present

## 2020-12-25 DIAGNOSIS — M86671 Other chronic osteomyelitis, right ankle and foot: Secondary | ICD-10-CM | POA: Diagnosis not present

## 2020-12-25 DIAGNOSIS — E11621 Type 2 diabetes mellitus with foot ulcer: Secondary | ICD-10-CM | POA: Diagnosis not present

## 2020-12-25 DIAGNOSIS — L97512 Non-pressure chronic ulcer of other part of right foot with fat layer exposed: Secondary | ICD-10-CM | POA: Diagnosis not present

## 2020-12-25 DIAGNOSIS — M868X7 Other osteomyelitis, ankle and foot: Secondary | ICD-10-CM | POA: Diagnosis not present

## 2020-12-25 DIAGNOSIS — E1169 Type 2 diabetes mellitus with other specified complication: Secondary | ICD-10-CM | POA: Diagnosis not present

## 2020-12-26 DIAGNOSIS — E11621 Type 2 diabetes mellitus with foot ulcer: Secondary | ICD-10-CM | POA: Diagnosis not present

## 2020-12-26 DIAGNOSIS — Z794 Long term (current) use of insulin: Secondary | ICD-10-CM | POA: Diagnosis not present

## 2020-12-26 DIAGNOSIS — L97512 Non-pressure chronic ulcer of other part of right foot with fat layer exposed: Secondary | ICD-10-CM | POA: Diagnosis not present

## 2020-12-26 DIAGNOSIS — E1169 Type 2 diabetes mellitus with other specified complication: Secondary | ICD-10-CM | POA: Diagnosis not present

## 2020-12-26 DIAGNOSIS — M868X7 Other osteomyelitis, ankle and foot: Secondary | ICD-10-CM | POA: Diagnosis not present

## 2020-12-26 DIAGNOSIS — M86671 Other chronic osteomyelitis, right ankle and foot: Secondary | ICD-10-CM | POA: Diagnosis not present

## 2020-12-27 DIAGNOSIS — M86671 Other chronic osteomyelitis, right ankle and foot: Secondary | ICD-10-CM | POA: Diagnosis not present

## 2020-12-27 DIAGNOSIS — M868X7 Other osteomyelitis, ankle and foot: Secondary | ICD-10-CM | POA: Diagnosis not present

## 2020-12-27 DIAGNOSIS — L97512 Non-pressure chronic ulcer of other part of right foot with fat layer exposed: Secondary | ICD-10-CM | POA: Diagnosis not present

## 2020-12-27 DIAGNOSIS — E1169 Type 2 diabetes mellitus with other specified complication: Secondary | ICD-10-CM | POA: Diagnosis not present

## 2020-12-27 DIAGNOSIS — E11621 Type 2 diabetes mellitus with foot ulcer: Secondary | ICD-10-CM | POA: Diagnosis not present

## 2020-12-27 DIAGNOSIS — Z794 Long term (current) use of insulin: Secondary | ICD-10-CM | POA: Diagnosis not present

## 2020-12-28 DIAGNOSIS — L97512 Non-pressure chronic ulcer of other part of right foot with fat layer exposed: Secondary | ICD-10-CM | POA: Diagnosis not present

## 2020-12-28 DIAGNOSIS — Z794 Long term (current) use of insulin: Secondary | ICD-10-CM | POA: Diagnosis not present

## 2020-12-28 DIAGNOSIS — E1169 Type 2 diabetes mellitus with other specified complication: Secondary | ICD-10-CM | POA: Diagnosis not present

## 2020-12-28 DIAGNOSIS — E11621 Type 2 diabetes mellitus with foot ulcer: Secondary | ICD-10-CM | POA: Diagnosis not present

## 2020-12-28 DIAGNOSIS — M86671 Other chronic osteomyelitis, right ankle and foot: Secondary | ICD-10-CM | POA: Diagnosis not present

## 2020-12-28 DIAGNOSIS — M868X7 Other osteomyelitis, ankle and foot: Secondary | ICD-10-CM | POA: Diagnosis not present

## 2020-12-29 DIAGNOSIS — H353221 Exudative age-related macular degeneration, left eye, with active choroidal neovascularization: Secondary | ICD-10-CM | POA: Diagnosis not present

## 2020-12-29 DIAGNOSIS — H43813 Vitreous degeneration, bilateral: Secondary | ICD-10-CM | POA: Diagnosis not present

## 2020-12-29 DIAGNOSIS — M86671 Other chronic osteomyelitis, right ankle and foot: Secondary | ICD-10-CM | POA: Diagnosis not present

## 2020-12-29 DIAGNOSIS — H35033 Hypertensive retinopathy, bilateral: Secondary | ICD-10-CM | POA: Diagnosis not present

## 2020-12-29 DIAGNOSIS — H353112 Nonexudative age-related macular degeneration, right eye, intermediate dry stage: Secondary | ICD-10-CM | POA: Diagnosis not present

## 2020-12-29 DIAGNOSIS — E11621 Type 2 diabetes mellitus with foot ulcer: Secondary | ICD-10-CM | POA: Diagnosis not present

## 2020-12-29 DIAGNOSIS — Z794 Long term (current) use of insulin: Secondary | ICD-10-CM | POA: Diagnosis not present

## 2020-12-29 DIAGNOSIS — L97512 Non-pressure chronic ulcer of other part of right foot with fat layer exposed: Secondary | ICD-10-CM | POA: Diagnosis not present

## 2020-12-29 DIAGNOSIS — E1169 Type 2 diabetes mellitus with other specified complication: Secondary | ICD-10-CM | POA: Diagnosis not present

## 2020-12-29 DIAGNOSIS — M868X7 Other osteomyelitis, ankle and foot: Secondary | ICD-10-CM | POA: Diagnosis not present

## 2020-12-30 DIAGNOSIS — M86671 Other chronic osteomyelitis, right ankle and foot: Secondary | ICD-10-CM | POA: Diagnosis not present

## 2020-12-30 DIAGNOSIS — L97512 Non-pressure chronic ulcer of other part of right foot with fat layer exposed: Secondary | ICD-10-CM | POA: Diagnosis not present

## 2020-12-30 DIAGNOSIS — M868X7 Other osteomyelitis, ankle and foot: Secondary | ICD-10-CM | POA: Diagnosis not present

## 2020-12-30 DIAGNOSIS — Z794 Long term (current) use of insulin: Secondary | ICD-10-CM | POA: Diagnosis not present

## 2020-12-30 DIAGNOSIS — E11621 Type 2 diabetes mellitus with foot ulcer: Secondary | ICD-10-CM | POA: Diagnosis not present

## 2020-12-30 DIAGNOSIS — E1169 Type 2 diabetes mellitus with other specified complication: Secondary | ICD-10-CM | POA: Diagnosis not present

## 2020-12-31 DIAGNOSIS — E1169 Type 2 diabetes mellitus with other specified complication: Secondary | ICD-10-CM | POA: Diagnosis not present

## 2020-12-31 DIAGNOSIS — Z794 Long term (current) use of insulin: Secondary | ICD-10-CM | POA: Diagnosis not present

## 2020-12-31 DIAGNOSIS — L97512 Non-pressure chronic ulcer of other part of right foot with fat layer exposed: Secondary | ICD-10-CM | POA: Diagnosis not present

## 2020-12-31 DIAGNOSIS — M868X7 Other osteomyelitis, ankle and foot: Secondary | ICD-10-CM | POA: Diagnosis not present

## 2020-12-31 DIAGNOSIS — E11621 Type 2 diabetes mellitus with foot ulcer: Secondary | ICD-10-CM | POA: Diagnosis not present

## 2020-12-31 DIAGNOSIS — M86671 Other chronic osteomyelitis, right ankle and foot: Secondary | ICD-10-CM | POA: Diagnosis not present

## 2021-01-01 DIAGNOSIS — M868X7 Other osteomyelitis, ankle and foot: Secondary | ICD-10-CM | POA: Diagnosis not present

## 2021-01-01 DIAGNOSIS — M86671 Other chronic osteomyelitis, right ankle and foot: Secondary | ICD-10-CM | POA: Diagnosis not present

## 2021-01-01 DIAGNOSIS — E1169 Type 2 diabetes mellitus with other specified complication: Secondary | ICD-10-CM | POA: Diagnosis not present

## 2021-01-01 DIAGNOSIS — M86271 Subacute osteomyelitis, right ankle and foot: Secondary | ICD-10-CM | POA: Diagnosis not present

## 2021-01-01 DIAGNOSIS — L97512 Non-pressure chronic ulcer of other part of right foot with fat layer exposed: Secondary | ICD-10-CM | POA: Diagnosis not present

## 2021-01-01 DIAGNOSIS — E11621 Type 2 diabetes mellitus with foot ulcer: Secondary | ICD-10-CM | POA: Diagnosis not present

## 2021-01-01 DIAGNOSIS — Z794 Long term (current) use of insulin: Secondary | ICD-10-CM | POA: Diagnosis not present

## 2021-01-01 DIAGNOSIS — I872 Venous insufficiency (chronic) (peripheral): Secondary | ICD-10-CM | POA: Diagnosis not present

## 2021-01-01 DIAGNOSIS — L97516 Non-pressure chronic ulcer of other part of right foot with bone involvement without evidence of necrosis: Secondary | ICD-10-CM | POA: Diagnosis not present

## 2021-01-02 DIAGNOSIS — L97512 Non-pressure chronic ulcer of other part of right foot with fat layer exposed: Secondary | ICD-10-CM | POA: Diagnosis not present

## 2021-01-02 DIAGNOSIS — Z794 Long term (current) use of insulin: Secondary | ICD-10-CM | POA: Diagnosis not present

## 2021-01-02 DIAGNOSIS — E11621 Type 2 diabetes mellitus with foot ulcer: Secondary | ICD-10-CM | POA: Diagnosis not present

## 2021-01-02 DIAGNOSIS — E1169 Type 2 diabetes mellitus with other specified complication: Secondary | ICD-10-CM | POA: Diagnosis not present

## 2021-01-02 DIAGNOSIS — M868X7 Other osteomyelitis, ankle and foot: Secondary | ICD-10-CM | POA: Diagnosis not present

## 2021-01-02 DIAGNOSIS — M86671 Other chronic osteomyelitis, right ankle and foot: Secondary | ICD-10-CM | POA: Diagnosis not present

## 2021-01-03 DIAGNOSIS — L97512 Non-pressure chronic ulcer of other part of right foot with fat layer exposed: Secondary | ICD-10-CM | POA: Diagnosis not present

## 2021-01-03 DIAGNOSIS — M86671 Other chronic osteomyelitis, right ankle and foot: Secondary | ICD-10-CM | POA: Diagnosis not present

## 2021-01-03 DIAGNOSIS — M868X7 Other osteomyelitis, ankle and foot: Secondary | ICD-10-CM | POA: Diagnosis not present

## 2021-01-03 DIAGNOSIS — E1169 Type 2 diabetes mellitus with other specified complication: Secondary | ICD-10-CM | POA: Diagnosis not present

## 2021-01-03 DIAGNOSIS — Z794 Long term (current) use of insulin: Secondary | ICD-10-CM | POA: Diagnosis not present

## 2021-01-03 DIAGNOSIS — E11621 Type 2 diabetes mellitus with foot ulcer: Secondary | ICD-10-CM | POA: Diagnosis not present

## 2021-01-04 DIAGNOSIS — M868X7 Other osteomyelitis, ankle and foot: Secondary | ICD-10-CM | POA: Diagnosis not present

## 2021-01-04 DIAGNOSIS — L97512 Non-pressure chronic ulcer of other part of right foot with fat layer exposed: Secondary | ICD-10-CM | POA: Diagnosis not present

## 2021-01-04 DIAGNOSIS — E1169 Type 2 diabetes mellitus with other specified complication: Secondary | ICD-10-CM | POA: Diagnosis not present

## 2021-01-04 DIAGNOSIS — M86671 Other chronic osteomyelitis, right ankle and foot: Secondary | ICD-10-CM | POA: Diagnosis not present

## 2021-01-04 DIAGNOSIS — Z794 Long term (current) use of insulin: Secondary | ICD-10-CM | POA: Diagnosis not present

## 2021-01-04 DIAGNOSIS — E1342 Other specified diabetes mellitus with diabetic polyneuropathy: Secondary | ICD-10-CM | POA: Diagnosis not present

## 2021-01-04 DIAGNOSIS — E11621 Type 2 diabetes mellitus with foot ulcer: Secondary | ICD-10-CM | POA: Diagnosis not present

## 2021-01-05 DIAGNOSIS — Z794 Long term (current) use of insulin: Secondary | ICD-10-CM | POA: Diagnosis not present

## 2021-01-05 DIAGNOSIS — M868X7 Other osteomyelitis, ankle and foot: Secondary | ICD-10-CM | POA: Diagnosis not present

## 2021-01-05 DIAGNOSIS — E1169 Type 2 diabetes mellitus with other specified complication: Secondary | ICD-10-CM | POA: Diagnosis not present

## 2021-01-05 DIAGNOSIS — E11621 Type 2 diabetes mellitus with foot ulcer: Secondary | ICD-10-CM | POA: Diagnosis not present

## 2021-01-05 DIAGNOSIS — L97512 Non-pressure chronic ulcer of other part of right foot with fat layer exposed: Secondary | ICD-10-CM | POA: Diagnosis not present

## 2021-01-05 DIAGNOSIS — M86671 Other chronic osteomyelitis, right ankle and foot: Secondary | ICD-10-CM | POA: Diagnosis not present

## 2021-01-06 DIAGNOSIS — M86671 Other chronic osteomyelitis, right ankle and foot: Secondary | ICD-10-CM | POA: Diagnosis not present

## 2021-01-06 DIAGNOSIS — L97512 Non-pressure chronic ulcer of other part of right foot with fat layer exposed: Secondary | ICD-10-CM | POA: Diagnosis not present

## 2021-01-06 DIAGNOSIS — Z794 Long term (current) use of insulin: Secondary | ICD-10-CM | POA: Diagnosis not present

## 2021-01-06 DIAGNOSIS — E1169 Type 2 diabetes mellitus with other specified complication: Secondary | ICD-10-CM | POA: Diagnosis not present

## 2021-01-06 DIAGNOSIS — E11621 Type 2 diabetes mellitus with foot ulcer: Secondary | ICD-10-CM | POA: Diagnosis not present

## 2021-01-06 DIAGNOSIS — M868X7 Other osteomyelitis, ankle and foot: Secondary | ICD-10-CM | POA: Diagnosis not present

## 2021-01-07 DIAGNOSIS — E11621 Type 2 diabetes mellitus with foot ulcer: Secondary | ICD-10-CM | POA: Diagnosis not present

## 2021-01-07 DIAGNOSIS — L97512 Non-pressure chronic ulcer of other part of right foot with fat layer exposed: Secondary | ICD-10-CM | POA: Diagnosis not present

## 2021-01-07 DIAGNOSIS — M868X7 Other osteomyelitis, ankle and foot: Secondary | ICD-10-CM | POA: Diagnosis not present

## 2021-01-07 DIAGNOSIS — Z794 Long term (current) use of insulin: Secondary | ICD-10-CM | POA: Diagnosis not present

## 2021-01-07 DIAGNOSIS — E1169 Type 2 diabetes mellitus with other specified complication: Secondary | ICD-10-CM | POA: Diagnosis not present

## 2021-01-07 DIAGNOSIS — M86671 Other chronic osteomyelitis, right ankle and foot: Secondary | ICD-10-CM | POA: Diagnosis not present

## 2021-01-08 DIAGNOSIS — M86671 Other chronic osteomyelitis, right ankle and foot: Secondary | ICD-10-CM | POA: Diagnosis not present

## 2021-01-08 DIAGNOSIS — Z794 Long term (current) use of insulin: Secondary | ICD-10-CM | POA: Diagnosis not present

## 2021-01-08 DIAGNOSIS — E1169 Type 2 diabetes mellitus with other specified complication: Secondary | ICD-10-CM | POA: Diagnosis not present

## 2021-01-08 DIAGNOSIS — M868X7 Other osteomyelitis, ankle and foot: Secondary | ICD-10-CM | POA: Diagnosis not present

## 2021-01-08 DIAGNOSIS — E11621 Type 2 diabetes mellitus with foot ulcer: Secondary | ICD-10-CM | POA: Diagnosis not present

## 2021-01-08 DIAGNOSIS — L97512 Non-pressure chronic ulcer of other part of right foot with fat layer exposed: Secondary | ICD-10-CM | POA: Diagnosis not present

## 2021-01-09 ENCOUNTER — Other Ambulatory Visit: Payer: Self-pay | Admitting: Pulmonary Disease

## 2021-01-09 DIAGNOSIS — L97512 Non-pressure chronic ulcer of other part of right foot with fat layer exposed: Secondary | ICD-10-CM | POA: Diagnosis not present

## 2021-01-09 DIAGNOSIS — E11621 Type 2 diabetes mellitus with foot ulcer: Secondary | ICD-10-CM | POA: Diagnosis not present

## 2021-01-09 DIAGNOSIS — Z833 Family history of diabetes mellitus: Secondary | ICD-10-CM | POA: Diagnosis not present

## 2021-01-09 DIAGNOSIS — J45909 Unspecified asthma, uncomplicated: Secondary | ICD-10-CM | POA: Diagnosis not present

## 2021-01-09 DIAGNOSIS — Z6841 Body Mass Index (BMI) 40.0 and over, adult: Secondary | ICD-10-CM | POA: Diagnosis not present

## 2021-01-09 DIAGNOSIS — Z452 Encounter for adjustment and management of vascular access device: Secondary | ICD-10-CM | POA: Diagnosis not present

## 2021-01-09 DIAGNOSIS — Z881 Allergy status to other antibiotic agents status: Secondary | ICD-10-CM | POA: Diagnosis not present

## 2021-01-09 DIAGNOSIS — Z885 Allergy status to narcotic agent status: Secondary | ICD-10-CM | POA: Diagnosis not present

## 2021-01-09 DIAGNOSIS — Z794 Long term (current) use of insulin: Secondary | ICD-10-CM | POA: Diagnosis not present

## 2021-01-09 DIAGNOSIS — E1169 Type 2 diabetes mellitus with other specified complication: Secondary | ICD-10-CM | POA: Diagnosis not present

## 2021-01-09 DIAGNOSIS — M86671 Other chronic osteomyelitis, right ankle and foot: Secondary | ICD-10-CM | POA: Diagnosis not present

## 2021-01-09 DIAGNOSIS — Z87891 Personal history of nicotine dependence: Secondary | ICD-10-CM | POA: Diagnosis not present

## 2021-01-09 DIAGNOSIS — L97516 Non-pressure chronic ulcer of other part of right foot with bone involvement without evidence of necrosis: Secondary | ICD-10-CM | POA: Diagnosis not present

## 2021-01-09 DIAGNOSIS — M19071 Primary osteoarthritis, right ankle and foot: Secondary | ICD-10-CM | POA: Diagnosis not present

## 2021-01-09 DIAGNOSIS — M868X7 Other osteomyelitis, ankle and foot: Secondary | ICD-10-CM | POA: Diagnosis not present

## 2021-01-09 DIAGNOSIS — I2510 Atherosclerotic heart disease of native coronary artery without angina pectoris: Secondary | ICD-10-CM | POA: Diagnosis not present

## 2021-01-09 DIAGNOSIS — M86271 Subacute osteomyelitis, right ankle and foot: Secondary | ICD-10-CM | POA: Diagnosis not present

## 2021-01-10 DIAGNOSIS — M86671 Other chronic osteomyelitis, right ankle and foot: Secondary | ICD-10-CM | POA: Diagnosis not present

## 2021-01-10 DIAGNOSIS — E1169 Type 2 diabetes mellitus with other specified complication: Secondary | ICD-10-CM | POA: Diagnosis not present

## 2021-01-10 DIAGNOSIS — J45909 Unspecified asthma, uncomplicated: Secondary | ICD-10-CM | POA: Diagnosis not present

## 2021-01-10 DIAGNOSIS — Z794 Long term (current) use of insulin: Secondary | ICD-10-CM | POA: Diagnosis not present

## 2021-01-10 DIAGNOSIS — M868X7 Other osteomyelitis, ankle and foot: Secondary | ICD-10-CM | POA: Diagnosis not present

## 2021-01-10 DIAGNOSIS — Z452 Encounter for adjustment and management of vascular access device: Secondary | ICD-10-CM | POA: Diagnosis not present

## 2021-01-11 ENCOUNTER — Other Ambulatory Visit (HOSPITAL_COMMUNITY): Payer: Self-pay | Admitting: Internal Medicine

## 2021-01-11 DIAGNOSIS — M86671 Other chronic osteomyelitis, right ankle and foot: Secondary | ICD-10-CM | POA: Diagnosis not present

## 2021-01-11 DIAGNOSIS — M868X7 Other osteomyelitis, ankle and foot: Secondary | ICD-10-CM | POA: Diagnosis not present

## 2021-01-11 DIAGNOSIS — Z452 Encounter for adjustment and management of vascular access device: Secondary | ICD-10-CM | POA: Diagnosis not present

## 2021-01-11 DIAGNOSIS — Z1231 Encounter for screening mammogram for malignant neoplasm of breast: Secondary | ICD-10-CM

## 2021-01-11 DIAGNOSIS — Z794 Long term (current) use of insulin: Secondary | ICD-10-CM | POA: Diagnosis not present

## 2021-01-11 DIAGNOSIS — E1169 Type 2 diabetes mellitus with other specified complication: Secondary | ICD-10-CM | POA: Diagnosis not present

## 2021-01-11 DIAGNOSIS — J45909 Unspecified asthma, uncomplicated: Secondary | ICD-10-CM | POA: Diagnosis not present

## 2021-01-12 DIAGNOSIS — J45909 Unspecified asthma, uncomplicated: Secondary | ICD-10-CM | POA: Diagnosis not present

## 2021-01-12 DIAGNOSIS — Z452 Encounter for adjustment and management of vascular access device: Secondary | ICD-10-CM | POA: Diagnosis not present

## 2021-01-12 DIAGNOSIS — E1169 Type 2 diabetes mellitus with other specified complication: Secondary | ICD-10-CM | POA: Diagnosis not present

## 2021-01-12 DIAGNOSIS — M86671 Other chronic osteomyelitis, right ankle and foot: Secondary | ICD-10-CM | POA: Diagnosis not present

## 2021-01-12 DIAGNOSIS — Z794 Long term (current) use of insulin: Secondary | ICD-10-CM | POA: Diagnosis not present

## 2021-01-12 DIAGNOSIS — M868X7 Other osteomyelitis, ankle and foot: Secondary | ICD-10-CM | POA: Diagnosis not present

## 2021-01-13 DIAGNOSIS — Z794 Long term (current) use of insulin: Secondary | ICD-10-CM | POA: Diagnosis not present

## 2021-01-13 DIAGNOSIS — E11621 Type 2 diabetes mellitus with foot ulcer: Secondary | ICD-10-CM | POA: Diagnosis not present

## 2021-01-13 DIAGNOSIS — M868X7 Other osteomyelitis, ankle and foot: Secondary | ICD-10-CM | POA: Diagnosis not present

## 2021-01-13 DIAGNOSIS — L97512 Non-pressure chronic ulcer of other part of right foot with fat layer exposed: Secondary | ICD-10-CM | POA: Diagnosis not present

## 2021-01-13 DIAGNOSIS — E1169 Type 2 diabetes mellitus with other specified complication: Secondary | ICD-10-CM | POA: Diagnosis not present

## 2021-01-13 DIAGNOSIS — J45909 Unspecified asthma, uncomplicated: Secondary | ICD-10-CM | POA: Diagnosis not present

## 2021-01-13 DIAGNOSIS — M86671 Other chronic osteomyelitis, right ankle and foot: Secondary | ICD-10-CM | POA: Diagnosis not present

## 2021-01-13 DIAGNOSIS — Z452 Encounter for adjustment and management of vascular access device: Secondary | ICD-10-CM | POA: Diagnosis not present

## 2021-01-14 DIAGNOSIS — Z794 Long term (current) use of insulin: Secondary | ICD-10-CM | POA: Diagnosis not present

## 2021-01-14 DIAGNOSIS — E1169 Type 2 diabetes mellitus with other specified complication: Secondary | ICD-10-CM | POA: Diagnosis not present

## 2021-01-14 DIAGNOSIS — J45909 Unspecified asthma, uncomplicated: Secondary | ICD-10-CM | POA: Diagnosis not present

## 2021-01-14 DIAGNOSIS — M86671 Other chronic osteomyelitis, right ankle and foot: Secondary | ICD-10-CM | POA: Diagnosis not present

## 2021-01-14 DIAGNOSIS — Z452 Encounter for adjustment and management of vascular access device: Secondary | ICD-10-CM | POA: Diagnosis not present

## 2021-01-14 DIAGNOSIS — M868X7 Other osteomyelitis, ankle and foot: Secondary | ICD-10-CM | POA: Diagnosis not present

## 2021-01-15 DIAGNOSIS — L97516 Non-pressure chronic ulcer of other part of right foot with bone involvement without evidence of necrosis: Secondary | ICD-10-CM | POA: Diagnosis not present

## 2021-01-15 DIAGNOSIS — E1169 Type 2 diabetes mellitus with other specified complication: Secondary | ICD-10-CM | POA: Diagnosis not present

## 2021-01-15 DIAGNOSIS — Z452 Encounter for adjustment and management of vascular access device: Secondary | ICD-10-CM | POA: Diagnosis not present

## 2021-01-15 DIAGNOSIS — Z794 Long term (current) use of insulin: Secondary | ICD-10-CM | POA: Diagnosis not present

## 2021-01-15 DIAGNOSIS — M19071 Primary osteoarthritis, right ankle and foot: Secondary | ICD-10-CM | POA: Diagnosis not present

## 2021-01-15 DIAGNOSIS — J45909 Unspecified asthma, uncomplicated: Secondary | ICD-10-CM | POA: Diagnosis not present

## 2021-01-15 DIAGNOSIS — M86671 Other chronic osteomyelitis, right ankle and foot: Secondary | ICD-10-CM | POA: Diagnosis not present

## 2021-01-15 DIAGNOSIS — M868X7 Other osteomyelitis, ankle and foot: Secondary | ICD-10-CM | POA: Diagnosis not present

## 2021-01-15 DIAGNOSIS — M86271 Subacute osteomyelitis, right ankle and foot: Secondary | ICD-10-CM | POA: Diagnosis not present

## 2021-01-15 DIAGNOSIS — E11621 Type 2 diabetes mellitus with foot ulcer: Secondary | ICD-10-CM | POA: Diagnosis not present

## 2021-01-16 DIAGNOSIS — Z794 Long term (current) use of insulin: Secondary | ICD-10-CM | POA: Diagnosis not present

## 2021-01-16 DIAGNOSIS — M868X7 Other osteomyelitis, ankle and foot: Secondary | ICD-10-CM | POA: Diagnosis not present

## 2021-01-16 DIAGNOSIS — E1169 Type 2 diabetes mellitus with other specified complication: Secondary | ICD-10-CM | POA: Diagnosis not present

## 2021-01-16 DIAGNOSIS — Z452 Encounter for adjustment and management of vascular access device: Secondary | ICD-10-CM | POA: Diagnosis not present

## 2021-01-16 DIAGNOSIS — M86671 Other chronic osteomyelitis, right ankle and foot: Secondary | ICD-10-CM | POA: Diagnosis not present

## 2021-01-16 DIAGNOSIS — J45909 Unspecified asthma, uncomplicated: Secondary | ICD-10-CM | POA: Diagnosis not present

## 2021-01-17 DIAGNOSIS — Z452 Encounter for adjustment and management of vascular access device: Secondary | ICD-10-CM | POA: Diagnosis not present

## 2021-01-17 DIAGNOSIS — M86671 Other chronic osteomyelitis, right ankle and foot: Secondary | ICD-10-CM | POA: Diagnosis not present

## 2021-01-17 DIAGNOSIS — M868X7 Other osteomyelitis, ankle and foot: Secondary | ICD-10-CM | POA: Diagnosis not present

## 2021-01-17 DIAGNOSIS — Z794 Long term (current) use of insulin: Secondary | ICD-10-CM | POA: Diagnosis not present

## 2021-01-17 DIAGNOSIS — E1169 Type 2 diabetes mellitus with other specified complication: Secondary | ICD-10-CM | POA: Diagnosis not present

## 2021-01-17 DIAGNOSIS — J45909 Unspecified asthma, uncomplicated: Secondary | ICD-10-CM | POA: Diagnosis not present

## 2021-01-18 DIAGNOSIS — Z794 Long term (current) use of insulin: Secondary | ICD-10-CM | POA: Diagnosis not present

## 2021-01-18 DIAGNOSIS — E1169 Type 2 diabetes mellitus with other specified complication: Secondary | ICD-10-CM | POA: Diagnosis not present

## 2021-01-18 DIAGNOSIS — J45909 Unspecified asthma, uncomplicated: Secondary | ICD-10-CM | POA: Diagnosis not present

## 2021-01-18 DIAGNOSIS — E1342 Other specified diabetes mellitus with diabetic polyneuropathy: Secondary | ICD-10-CM | POA: Diagnosis not present

## 2021-01-18 DIAGNOSIS — Z452 Encounter for adjustment and management of vascular access device: Secondary | ICD-10-CM | POA: Diagnosis not present

## 2021-01-18 DIAGNOSIS — L97512 Non-pressure chronic ulcer of other part of right foot with fat layer exposed: Secondary | ICD-10-CM | POA: Diagnosis not present

## 2021-01-18 DIAGNOSIS — M86671 Other chronic osteomyelitis, right ankle and foot: Secondary | ICD-10-CM | POA: Diagnosis not present

## 2021-01-18 DIAGNOSIS — M868X7 Other osteomyelitis, ankle and foot: Secondary | ICD-10-CM | POA: Diagnosis not present

## 2021-01-19 DIAGNOSIS — E1169 Type 2 diabetes mellitus with other specified complication: Secondary | ICD-10-CM | POA: Diagnosis not present

## 2021-01-19 DIAGNOSIS — E11621 Type 2 diabetes mellitus with foot ulcer: Secondary | ICD-10-CM | POA: Diagnosis not present

## 2021-01-19 DIAGNOSIS — M86671 Other chronic osteomyelitis, right ankle and foot: Secondary | ICD-10-CM | POA: Diagnosis not present

## 2021-01-19 DIAGNOSIS — L97516 Non-pressure chronic ulcer of other part of right foot with bone involvement without evidence of necrosis: Secondary | ICD-10-CM | POA: Diagnosis not present

## 2021-01-19 DIAGNOSIS — I10 Essential (primary) hypertension: Secondary | ICD-10-CM | POA: Diagnosis not present

## 2021-01-19 DIAGNOSIS — M869 Osteomyelitis, unspecified: Secondary | ICD-10-CM | POA: Diagnosis not present

## 2021-01-19 DIAGNOSIS — M868X7 Other osteomyelitis, ankle and foot: Secondary | ICD-10-CM | POA: Diagnosis not present

## 2021-01-19 DIAGNOSIS — Z452 Encounter for adjustment and management of vascular access device: Secondary | ICD-10-CM | POA: Diagnosis not present

## 2021-01-19 DIAGNOSIS — Z794 Long term (current) use of insulin: Secondary | ICD-10-CM | POA: Diagnosis not present

## 2021-01-19 DIAGNOSIS — J45909 Unspecified asthma, uncomplicated: Secondary | ICD-10-CM | POA: Diagnosis not present

## 2021-01-19 DIAGNOSIS — M86271 Subacute osteomyelitis, right ankle and foot: Secondary | ICD-10-CM | POA: Diagnosis not present

## 2021-01-20 DIAGNOSIS — M86671 Other chronic osteomyelitis, right ankle and foot: Secondary | ICD-10-CM | POA: Diagnosis not present

## 2021-01-20 DIAGNOSIS — Z452 Encounter for adjustment and management of vascular access device: Secondary | ICD-10-CM | POA: Diagnosis not present

## 2021-01-20 DIAGNOSIS — M868X7 Other osteomyelitis, ankle and foot: Secondary | ICD-10-CM | POA: Diagnosis not present

## 2021-01-20 DIAGNOSIS — E1169 Type 2 diabetes mellitus with other specified complication: Secondary | ICD-10-CM | POA: Diagnosis not present

## 2021-01-20 DIAGNOSIS — J45909 Unspecified asthma, uncomplicated: Secondary | ICD-10-CM | POA: Diagnosis not present

## 2021-01-20 DIAGNOSIS — Z794 Long term (current) use of insulin: Secondary | ICD-10-CM | POA: Diagnosis not present

## 2021-01-21 DIAGNOSIS — J45909 Unspecified asthma, uncomplicated: Secondary | ICD-10-CM | POA: Diagnosis not present

## 2021-01-21 DIAGNOSIS — E1169 Type 2 diabetes mellitus with other specified complication: Secondary | ICD-10-CM | POA: Diagnosis not present

## 2021-01-21 DIAGNOSIS — E11621 Type 2 diabetes mellitus with foot ulcer: Secondary | ICD-10-CM | POA: Diagnosis not present

## 2021-01-21 DIAGNOSIS — L97512 Non-pressure chronic ulcer of other part of right foot with fat layer exposed: Secondary | ICD-10-CM | POA: Diagnosis not present

## 2021-01-21 DIAGNOSIS — Z452 Encounter for adjustment and management of vascular access device: Secondary | ICD-10-CM | POA: Diagnosis not present

## 2021-01-21 DIAGNOSIS — Z794 Long term (current) use of insulin: Secondary | ICD-10-CM | POA: Diagnosis not present

## 2021-01-21 DIAGNOSIS — M86671 Other chronic osteomyelitis, right ankle and foot: Secondary | ICD-10-CM | POA: Diagnosis not present

## 2021-01-21 DIAGNOSIS — M868X7 Other osteomyelitis, ankle and foot: Secondary | ICD-10-CM | POA: Diagnosis not present

## 2021-01-26 DIAGNOSIS — H353112 Nonexudative age-related macular degeneration, right eye, intermediate dry stage: Secondary | ICD-10-CM | POA: Diagnosis not present

## 2021-01-26 DIAGNOSIS — H353221 Exudative age-related macular degeneration, left eye, with active choroidal neovascularization: Secondary | ICD-10-CM | POA: Diagnosis not present

## 2021-02-01 DIAGNOSIS — E1342 Other specified diabetes mellitus with diabetic polyneuropathy: Secondary | ICD-10-CM | POA: Diagnosis not present

## 2021-02-01 DIAGNOSIS — M869 Osteomyelitis, unspecified: Secondary | ICD-10-CM | POA: Diagnosis not present

## 2021-02-01 DIAGNOSIS — L97512 Non-pressure chronic ulcer of other part of right foot with fat layer exposed: Secondary | ICD-10-CM | POA: Diagnosis not present

## 2021-02-04 DIAGNOSIS — Z794 Long term (current) use of insulin: Secondary | ICD-10-CM | POA: Diagnosis not present

## 2021-02-04 DIAGNOSIS — I2510 Atherosclerotic heart disease of native coronary artery without angina pectoris: Secondary | ICD-10-CM | POA: Diagnosis not present

## 2021-02-04 DIAGNOSIS — Z6841 Body Mass Index (BMI) 40.0 and over, adult: Secondary | ICD-10-CM | POA: Diagnosis not present

## 2021-02-04 DIAGNOSIS — Z885 Allergy status to narcotic agent status: Secondary | ICD-10-CM | POA: Diagnosis not present

## 2021-02-04 DIAGNOSIS — Z87891 Personal history of nicotine dependence: Secondary | ICD-10-CM | POA: Diagnosis not present

## 2021-02-04 DIAGNOSIS — M86671 Other chronic osteomyelitis, right ankle and foot: Secondary | ICD-10-CM | POA: Diagnosis not present

## 2021-02-04 DIAGNOSIS — L97516 Non-pressure chronic ulcer of other part of right foot with bone involvement without evidence of necrosis: Secondary | ICD-10-CM | POA: Diagnosis not present

## 2021-02-04 DIAGNOSIS — Z881 Allergy status to other antibiotic agents status: Secondary | ICD-10-CM | POA: Diagnosis not present

## 2021-02-04 DIAGNOSIS — J45909 Unspecified asthma, uncomplicated: Secondary | ICD-10-CM | POA: Diagnosis not present

## 2021-02-04 DIAGNOSIS — Z833 Family history of diabetes mellitus: Secondary | ICD-10-CM | POA: Diagnosis not present

## 2021-02-04 DIAGNOSIS — E1169 Type 2 diabetes mellitus with other specified complication: Secondary | ICD-10-CM | POA: Diagnosis not present

## 2021-02-04 DIAGNOSIS — E11621 Type 2 diabetes mellitus with foot ulcer: Secondary | ICD-10-CM | POA: Diagnosis not present

## 2021-02-08 ENCOUNTER — Other Ambulatory Visit: Payer: Self-pay

## 2021-02-08 ENCOUNTER — Ambulatory Visit (HOSPITAL_COMMUNITY)
Admission: RE | Admit: 2021-02-08 | Discharge: 2021-02-08 | Disposition: A | Discharge status: Home / Self Care | Payer: Medicare Other | Source: Ambulatory Visit | Attending: Internal Medicine | Admitting: Internal Medicine

## 2021-02-08 DIAGNOSIS — Z1231 Encounter for screening mammogram for malignant neoplasm of breast: Secondary | ICD-10-CM | POA: Diagnosis not present

## 2021-02-09 DIAGNOSIS — M0589 Other rheumatoid arthritis with rheumatoid factor of multiple sites: Secondary | ICD-10-CM | POA: Diagnosis not present

## 2021-02-09 DIAGNOSIS — Z79899 Other long term (current) drug therapy: Secondary | ICD-10-CM | POA: Diagnosis not present

## 2021-02-15 ENCOUNTER — Other Ambulatory Visit: Payer: Self-pay | Admitting: Pulmonary Disease

## 2021-02-15 DIAGNOSIS — L97512 Non-pressure chronic ulcer of other part of right foot with fat layer exposed: Secondary | ICD-10-CM | POA: Diagnosis not present

## 2021-02-15 DIAGNOSIS — E1342 Other specified diabetes mellitus with diabetic polyneuropathy: Secondary | ICD-10-CM | POA: Diagnosis not present

## 2021-02-24 DIAGNOSIS — Z79899 Other long term (current) drug therapy: Secondary | ICD-10-CM | POA: Diagnosis not present

## 2021-02-24 DIAGNOSIS — E1129 Type 2 diabetes mellitus with other diabetic kidney complication: Secondary | ICD-10-CM | POA: Diagnosis not present

## 2021-02-24 DIAGNOSIS — M069 Rheumatoid arthritis, unspecified: Secondary | ICD-10-CM | POA: Diagnosis not present

## 2021-02-24 DIAGNOSIS — D509 Iron deficiency anemia, unspecified: Secondary | ICD-10-CM | POA: Diagnosis not present

## 2021-02-26 DIAGNOSIS — H35033 Hypertensive retinopathy, bilateral: Secondary | ICD-10-CM | POA: Diagnosis not present

## 2021-02-26 DIAGNOSIS — H353221 Exudative age-related macular degeneration, left eye, with active choroidal neovascularization: Secondary | ICD-10-CM | POA: Diagnosis not present

## 2021-02-26 DIAGNOSIS — H43813 Vitreous degeneration, bilateral: Secondary | ICD-10-CM | POA: Diagnosis not present

## 2021-02-26 DIAGNOSIS — H353112 Nonexudative age-related macular degeneration, right eye, intermediate dry stage: Secondary | ICD-10-CM | POA: Diagnosis not present

## 2021-03-03 DIAGNOSIS — M0579 Rheumatoid arthritis with rheumatoid factor of multiple sites without organ or systems involvement: Secondary | ICD-10-CM | POA: Diagnosis not present

## 2021-03-03 DIAGNOSIS — D509 Iron deficiency anemia, unspecified: Secondary | ICD-10-CM | POA: Diagnosis not present

## 2021-03-03 DIAGNOSIS — R7309 Other abnormal glucose: Secondary | ICD-10-CM | POA: Diagnosis not present

## 2021-03-03 DIAGNOSIS — R051 Acute cough: Secondary | ICD-10-CM | POA: Diagnosis not present

## 2021-03-03 DIAGNOSIS — E1122 Type 2 diabetes mellitus with diabetic chronic kidney disease: Secondary | ICD-10-CM | POA: Diagnosis not present

## 2021-03-09 DIAGNOSIS — Z79899 Other long term (current) drug therapy: Secondary | ICD-10-CM | POA: Diagnosis not present

## 2021-03-09 DIAGNOSIS — M0589 Other rheumatoid arthritis with rheumatoid factor of multiple sites: Secondary | ICD-10-CM | POA: Diagnosis not present

## 2021-03-09 DIAGNOSIS — Z6841 Body Mass Index (BMI) 40.0 and over, adult: Secondary | ICD-10-CM | POA: Diagnosis not present

## 2021-03-09 DIAGNOSIS — L299 Pruritus, unspecified: Secondary | ICD-10-CM | POA: Diagnosis not present

## 2021-03-09 DIAGNOSIS — M545 Low back pain, unspecified: Secondary | ICD-10-CM | POA: Diagnosis not present

## 2021-03-09 DIAGNOSIS — M150 Primary generalized (osteo)arthritis: Secondary | ICD-10-CM | POA: Diagnosis not present

## 2021-03-16 DIAGNOSIS — E1342 Other specified diabetes mellitus with diabetic polyneuropathy: Secondary | ICD-10-CM | POA: Diagnosis not present

## 2021-03-16 DIAGNOSIS — L97512 Non-pressure chronic ulcer of other part of right foot with fat layer exposed: Secondary | ICD-10-CM | POA: Diagnosis not present

## 2021-03-23 DIAGNOSIS — M0589 Other rheumatoid arthritis with rheumatoid factor of multiple sites: Secondary | ICD-10-CM | POA: Diagnosis not present

## 2021-04-13 ENCOUNTER — Ambulatory Visit (INDEPENDENT_AMBULATORY_CARE_PROVIDER_SITE_OTHER): Payer: Medicare Other | Admitting: Pulmonary Disease

## 2021-04-13 ENCOUNTER — Encounter: Payer: Self-pay | Admitting: Pulmonary Disease

## 2021-04-13 ENCOUNTER — Other Ambulatory Visit: Payer: Self-pay

## 2021-04-13 VITALS — BP 122/60 | HR 82 | Temp 98.0°F | Ht 61.5 in | Wt 264.0 lb

## 2021-04-13 DIAGNOSIS — G4730 Sleep apnea, unspecified: Secondary | ICD-10-CM

## 2021-04-13 DIAGNOSIS — R911 Solitary pulmonary nodule: Secondary | ICD-10-CM | POA: Diagnosis not present

## 2021-04-13 DIAGNOSIS — R053 Chronic cough: Secondary | ICD-10-CM | POA: Diagnosis not present

## 2021-04-13 DIAGNOSIS — Z8739 Personal history of other diseases of the musculoskeletal system and connective tissue: Secondary | ICD-10-CM

## 2021-04-13 DIAGNOSIS — G4733 Obstructive sleep apnea (adult) (pediatric): Secondary | ICD-10-CM

## 2021-04-13 DIAGNOSIS — J449 Chronic obstructive pulmonary disease, unspecified: Secondary | ICD-10-CM | POA: Diagnosis not present

## 2021-04-13 DIAGNOSIS — E669 Obesity, unspecified: Secondary | ICD-10-CM | POA: Diagnosis not present

## 2021-04-13 MED ORDER — FLUTICASONE PROPIONATE 50 MCG/ACT NA SUSP: 1.0000 | Freq: Every day | NASAL | 2 refills | Status: DC

## 2021-04-13 NOTE — Progress Notes (Signed)
[place] [facility]  Chief Complaint  Patient presents with   Follow-up    Patient is doing good, no concerns     Constitutional:  BP 122/60 (BP Location: Left Arm, Patient Position: Sitting, Cuff Size: Normal)    Pulse 82    Temp 98 F (36.7 C) (Oral)    Ht 5' 1.5" (1.562 m)    Wt 264 lb (119.7 kg)    SpO2 94%    BMI 49.07 kg/m   Past Medical History:  RA, HTN, HLD, GERD, DM, Depression  Past Surgical History:  Her  has a past surgical history that includes Total knee arthroplasty (05/07/2010); Revision total knee arthroplasty (06/09/2010); Appendectomy (1967); Abdominal hysterectomy (1992); Cholecystectomy (1992); Aorto-pulmonary window repair (1992); Hernia repair (2001); Hernia repair (2007); Abdominal wall mesh  removal (08/13/2009); Foreign body removal abdominal (01/04/2010); Colonoscopy (12/08/2011); Bone biopsy (Right, 04/19/2016); Colonoscopy with propofol (N/A, 10/16/2017); polypectomy (10/16/2017); Esophagogastroduodenoscopy (egd) with propofol (N/A, 10/29/2018); Colonoscopy with propofol (N/A, 10/29/2018); polypectomy (10/29/2018); and biopsy (10/29/2018).  Brief Summary:  [name] is a 71 y.o. female former smoker with obstructive sleep apnea and COPD.      Subjective:   She has persistent cough over the past few months.  Has sinus congestion and post nasal drip.  Cough worse around strong smells.  Not having wheeze.  Brings up clear, thick phlegm.    Using CPAP nightly.  No issues with mask fit.  Physical Exam:   Appearance - well kempt   ENMT - no sinus tenderness, no oral exudate, no LAN, Mallampati 3 airway, no stridor  Respiratory - equal breath sounds bilaterally, no wheezing or rales  CV - s1s2 regular rate and rhythm, no murmurs  Ext - no clubbing, no edema  Skin - no rashes  Psych - normal mood and affect     Pulmonary testing:  PFT 09/18/07 >> FEV1 1.38 (61%), FEV1% 59, TLC 4.48 (95%), DLCO 80%, + BD PFT 08/09/17 >>  FEV1 1.35 (61%), FEV1% 69, TLC 4.67 (98%), DLCO 97%  Chest Imaging:  HRCT chest 08/17/17 >> calcification RV, atherosclerosis, scarring, mild air trapping, micronodularity LLL, 1.4 cm nodule LUL, fatty liver CT chest 11/29/17 >> LUL nodule resolved HRCT chest 09/11/19 >> mild basilar subpleural scarring, 1.4 x 1.1 area of GGO LUL CT chest 09/17/20 >> mild centrilobular emphysema, LUL GGO 1 x 1.2 cm (no change)  Sleep Tests:  PSG 09/02/07 >> AHI 9 CPAP 03/02/21 to 03/31/21 >> used on 30 of 30 nights with average 7 hrs 33 min.  Average AHI 3 with CPAP 6 cm H2O  Cardiac Tests:  Echo 09/01/17 >> EF 65 to 70%, mild AS, severe MV calcification  Social History:  She  reports that she quit smoking about 29 years ago. Her smoking use included cigarettes. She has a 50.00 pack-year smoking history. She has never used smokeless tobacco. She reports that she does not drink alcohol and does not use drugs.  Family History:  Her family history includes Arthritis in her father; COPD in her father; Cancer in her mother; Diabetes in her father and mother; Hyperlipidemia in her brother and mother; Hypertension in her brother; Thyroid disease in her mother. She was adopted.     Assessment/Plan:   COPD with chronic bronchitis. - continue trelegy nd singulair - prn albuterol - prn flutter valve  Post nasal drip with upper airway cough. - add nasal irrigation and flonase - prn mucinex   Obstructive sleep apnea. 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 - f/u CT chest without contrast in June 2024   Obesity. - discussed importance of weight loss   Rheumatoid arthritis. - followed by Dr. Amil Amen with rheumatology - on MTX, remicade - if her cough doesn't improve, then might need to revisit whether her RA is impacting her lungs  Time Spent Involved in Patient Care on  Day of Examination:  37 minutes  Follow up:   Patient Instructions  Use saline nasal spray followed by flonase on a nightly basis for the next couple of weeks.  Call if your cough doesn't improve.  Follow up in 6 months.  Medication List:   Allergies as of 04/13/2021       Reactions   Demerol Nausea And Vomiting   Severe   Hydromorphone Hcl Itching   All over the body.   Tetracycline Hives        Medication List        Accurate as of April 13, 2021 11:13 AM. If you have any questions, ask your nurse or doctor.          acetaminophen 325 MG tablet Commonly known as: TYLENOL Take 650 mg by mouth every 6 (six) hours as needed (for pain.).   albuterol (2.5 MG/3ML) 0.083% nebulizer solution Commonly known as: PROVENTIL Take 3 mLs (2.5 mg total) by nebulization every 6 (six) hours as needed for wheezing or shortness of breath (dx: J43.9).   albuterol 108 (90 Base) MCG/ACT inhaler Commonly known as: ProAir HFA INHALE 2 PUFFS INTO THE LUNGS EVERY 6 HOURS AS NEEDED FOR WHEEZING OR SHORTNESS OF BREATH   ALPRAZolam 0.5 MG tablet Commonly known as: XANAX Take 0.5 mg by mouth 3 (three) times daily as needed for sleep or anxiety.   Benicar 20 MG tablet Generic drug: olmesartan Take 20 mg by mouth daily.   diclofenac sodium 1 % Gel Commonly known as: VOLTAREN Apply 2-4 g topically 4 (four) times daily as needed for pain.   empagliflozin 25 MG Tabs tablet Commonly known as: JARDIANCE Take 25 mg by mouth daily.   fluticasone 50 MCG/ACT nasal spray Commonly known as: FLONASE Place 1 spray into both nostrils daily. Started by: [name], MD   furosemide 40 MG tablet Commonly known as: LASIX Take 40 mg by mouth daily as needed for fluid. Fluid   gabapentin 600 MG tablet Commonly known as: NEURONTIN Take 600 mg by mouth at bedtime.   Gerhardt's butt cream Crea Apply 1 application topically 2 (two) times daily.   HumaLOG KwikPen 100 UNIT/ML KwikPen Generic  drug: insulin lispro Inject 8-14 Units into the skin See admin instructions. Inject 8 units subcutaneously in the morning & inject 14 units subcutaneously at supper   hydrOXYzine 25 MG tablet Commonly known as: ATARAX Take 25 mg by mouth 3 (three) times daily as needed (Neuropathic itch).   IRON PO Take by mouth.   Lantus SoloStar 100 UNIT/ML Solostar Pen Generic drug: insulin glargine Inject into the skin 2 (two) times a day. Inject 50 units under the skin in the morning and 60 units in the evening.   loratadine 10 MG tablet Commonly known as: CLARITIN Take 10 mg by mouth daily.   Lubricant Eye Drops 0.5 % Soln Generic drug: carboxymethylcellulose 1 drop 3 (three) times daily as needed.   metFORMIN 500  MG 24 hr tablet Commonly known as: GLUCOPHAGE-XR Take 2,000 mg by mouth daily.   methotrexate 2.5 MG tablet Commonly known as: RHEUMATREX Take 2.5 mg by mouth once a week. Caution:Chemotherapy. Protect from light.   montelukast 10 MG tablet Commonly known as: SINGULAIR TAKE 1 TABLET AT BEDTIME   omeprazole 40 MG capsule Commonly known as: PRILOSEC TAKE 1 CAPSULE EVERY MORNING   REMICADE IV Inject 1 Dose into the vein every 6 (six) weeks.   sertraline 50 MG tablet Commonly known as: ZOLOFT Take 50 mg by mouth daily.   simvastatin 40 MG tablet Commonly known as: ZOCOR Take 40 mg by mouth daily with supper.   Trulicity 9.84 RJ/0.8LU Sopn Generic drug: Dulaglutide SMARTSIG:0.5 Milliliter(s) SUB-Q Once a Week        Signature:  [name], MD [place] Pager - [phone] 04/13/2021, 11:13 AM

## 2021-04-13 NOTE — Patient Instructions (Signed)
Use saline nasal spray followed by flonase on a nightly basis for the next couple of weeks.  Call if your cough doesn't improve.  Follow up in 6 months.

## 2021-04-16 DIAGNOSIS — H353221 Exudative age-related macular degeneration, left eye, with active choroidal neovascularization: Secondary | ICD-10-CM | POA: Diagnosis not present

## 2021-04-16 DIAGNOSIS — H353112 Nonexudative age-related macular degeneration, right eye, intermediate dry stage: Secondary | ICD-10-CM | POA: Diagnosis not present

## 2021-04-21 DIAGNOSIS — E1342 Other specified diabetes mellitus with diabetic polyneuropathy: Secondary | ICD-10-CM | POA: Diagnosis not present

## 2021-04-21 DIAGNOSIS — B351 Tinea unguium: Secondary | ICD-10-CM | POA: Diagnosis not present

## 2021-04-21 DIAGNOSIS — L851 Acquired keratosis [keratoderma] palmaris et plantaris: Secondary | ICD-10-CM | POA: Diagnosis not present

## 2021-05-04 DIAGNOSIS — S31109A Unspecified open wound of abdominal wall, unspecified quadrant without penetration into peritoneal cavity, initial encounter: Secondary | ICD-10-CM | POA: Diagnosis not present

## 2021-05-04 DIAGNOSIS — N390 Urinary tract infection, site not specified: Secondary | ICD-10-CM | POA: Diagnosis not present

## 2021-05-04 DIAGNOSIS — N3000 Acute cystitis without hematuria: Secondary | ICD-10-CM | POA: Diagnosis not present

## 2021-05-17 DIAGNOSIS — L089 Local infection of the skin and subcutaneous tissue, unspecified: Secondary | ICD-10-CM | POA: Diagnosis not present

## 2021-05-17 DIAGNOSIS — L7682 Other postprocedural complications of skin and subcutaneous tissue: Secondary | ICD-10-CM | POA: Diagnosis not present

## 2021-05-18 DIAGNOSIS — M0589 Other rheumatoid arthritis with rheumatoid factor of multiple sites: Secondary | ICD-10-CM | POA: Diagnosis not present

## 2021-05-18 DIAGNOSIS — Z79899 Other long term (current) drug therapy: Secondary | ICD-10-CM | POA: Diagnosis not present

## 2021-05-26 DIAGNOSIS — E1142 Type 2 diabetes mellitus with diabetic polyneuropathy: Secondary | ICD-10-CM | POA: Diagnosis not present

## 2021-05-26 DIAGNOSIS — Z8631 Personal history of diabetic foot ulcer: Secondary | ICD-10-CM | POA: Diagnosis not present

## 2021-05-27 DIAGNOSIS — M0579 Rheumatoid arthritis with rheumatoid factor of multiple sites without organ or systems involvement: Secondary | ICD-10-CM | POA: Diagnosis not present

## 2021-05-27 DIAGNOSIS — Z79899 Other long term (current) drug therapy: Secondary | ICD-10-CM | POA: Diagnosis not present

## 2021-05-27 DIAGNOSIS — E1129 Type 2 diabetes mellitus with other diabetic kidney complication: Secondary | ICD-10-CM | POA: Diagnosis not present

## 2021-05-27 DIAGNOSIS — D509 Iron deficiency anemia, unspecified: Secondary | ICD-10-CM | POA: Diagnosis not present

## 2021-06-03 DIAGNOSIS — M069 Rheumatoid arthritis, unspecified: Secondary | ICD-10-CM | POA: Diagnosis not present

## 2021-06-03 DIAGNOSIS — I10 Essential (primary) hypertension: Secondary | ICD-10-CM | POA: Diagnosis not present

## 2021-06-03 DIAGNOSIS — D500 Iron deficiency anemia secondary to blood loss (chronic): Secondary | ICD-10-CM | POA: Diagnosis not present

## 2021-06-03 DIAGNOSIS — E1122 Type 2 diabetes mellitus with diabetic chronic kidney disease: Secondary | ICD-10-CM | POA: Diagnosis not present

## 2021-06-07 DIAGNOSIS — K432 Incisional hernia without obstruction or gangrene: Secondary | ICD-10-CM | POA: Diagnosis not present

## 2021-06-07 DIAGNOSIS — Z6841 Body Mass Index (BMI) 40.0 and over, adult: Secondary | ICD-10-CM | POA: Diagnosis not present

## 2021-06-07 DIAGNOSIS — Z79899 Other long term (current) drug therapy: Secondary | ICD-10-CM | POA: Diagnosis not present

## 2021-06-07 DIAGNOSIS — M795 Residual foreign body in soft tissue: Secondary | ICD-10-CM | POA: Diagnosis not present

## 2021-06-07 DIAGNOSIS — D84821 Immunodeficiency due to drugs: Secondary | ICD-10-CM | POA: Diagnosis not present

## 2021-06-07 DIAGNOSIS — L7682 Other postprocedural complications of skin and subcutaneous tissue: Secondary | ICD-10-CM | POA: Diagnosis not present

## 2021-06-11 DIAGNOSIS — H353221 Exudative age-related macular degeneration, left eye, with active choroidal neovascularization: Secondary | ICD-10-CM | POA: Diagnosis not present

## 2021-06-11 DIAGNOSIS — H353112 Nonexudative age-related macular degeneration, right eye, intermediate dry stage: Secondary | ICD-10-CM | POA: Diagnosis not present

## 2021-06-21 DIAGNOSIS — N179 Acute kidney failure, unspecified: Secondary | ICD-10-CM | POA: Diagnosis present

## 2021-06-21 DIAGNOSIS — R109 Unspecified abdominal pain: Secondary | ICD-10-CM | POA: Diagnosis not present

## 2021-06-21 DIAGNOSIS — R739 Hyperglycemia, unspecified: Secondary | ICD-10-CM | POA: Diagnosis not present

## 2021-06-21 DIAGNOSIS — J020 Streptococcal pharyngitis: Secondary | ICD-10-CM | POA: Diagnosis present

## 2021-06-21 DIAGNOSIS — R059 Cough, unspecified: Secondary | ICD-10-CM | POA: Diagnosis not present

## 2021-06-21 DIAGNOSIS — Z6841 Body Mass Index (BMI) 40.0 and over, adult: Secondary | ICD-10-CM | POA: Diagnosis not present

## 2021-06-21 DIAGNOSIS — R160 Hepatomegaly, not elsewhere classified: Secondary | ICD-10-CM | POA: Diagnosis present

## 2021-06-21 DIAGNOSIS — I959 Hypotension, unspecified: Secondary | ICD-10-CM | POA: Diagnosis not present

## 2021-06-21 DIAGNOSIS — I10 Essential (primary) hypertension: Secondary | ICD-10-CM | POA: Diagnosis present

## 2021-06-21 DIAGNOSIS — R509 Fever, unspecified: Secondary | ICD-10-CM | POA: Diagnosis not present

## 2021-06-21 DIAGNOSIS — E861 Hypovolemia: Secondary | ICD-10-CM | POA: Diagnosis present

## 2021-06-21 DIAGNOSIS — E1122 Type 2 diabetes mellitus with diabetic chronic kidney disease: Secondary | ICD-10-CM | POA: Diagnosis present

## 2021-06-21 DIAGNOSIS — M069 Rheumatoid arthritis, unspecified: Secondary | ICD-10-CM | POA: Diagnosis present

## 2021-06-21 DIAGNOSIS — R6520 Severe sepsis without septic shock: Secondary | ICD-10-CM | POA: Diagnosis present

## 2021-06-21 DIAGNOSIS — M47896 Other spondylosis, lumbar region: Secondary | ICD-10-CM | POA: Diagnosis present

## 2021-06-21 DIAGNOSIS — J189 Pneumonia, unspecified organism: Secondary | ICD-10-CM | POA: Diagnosis present

## 2021-06-21 DIAGNOSIS — E441 Mild protein-calorie malnutrition: Secondary | ICD-10-CM | POA: Diagnosis present

## 2021-06-21 DIAGNOSIS — A419 Sepsis, unspecified organism: Secondary | ICD-10-CM | POA: Diagnosis present

## 2021-06-21 DIAGNOSIS — R000 Tachycardia, unspecified: Secondary | ICD-10-CM | POA: Diagnosis not present

## 2021-06-21 DIAGNOSIS — K219 Gastro-esophageal reflux disease without esophagitis: Secondary | ICD-10-CM | POA: Diagnosis present

## 2021-06-21 DIAGNOSIS — D1779 Benign lipomatous neoplasm of other sites: Secondary | ICD-10-CM | POA: Diagnosis present

## 2021-06-21 DIAGNOSIS — L03311 Cellulitis of abdominal wall: Secondary | ICD-10-CM | POA: Diagnosis present

## 2021-06-21 DIAGNOSIS — R0902 Hypoxemia: Secondary | ICD-10-CM | POA: Diagnosis not present

## 2021-06-21 DIAGNOSIS — E872 Acidosis, unspecified: Secondary | ICD-10-CM | POA: Diagnosis present

## 2021-06-21 DIAGNOSIS — R161 Splenomegaly, not elsewhere classified: Secondary | ICD-10-CM | POA: Diagnosis present

## 2021-06-21 DIAGNOSIS — J9601 Acute respiratory failure with hypoxia: Secondary | ICD-10-CM | POA: Diagnosis present

## 2021-06-21 DIAGNOSIS — J180 Bronchopneumonia, unspecified organism: Secondary | ICD-10-CM | POA: Diagnosis not present

## 2021-06-21 DIAGNOSIS — J441 Chronic obstructive pulmonary disease with (acute) exacerbation: Secondary | ICD-10-CM | POA: Diagnosis present

## 2021-06-21 DIAGNOSIS — Z20822 Contact with and (suspected) exposure to covid-19: Secondary | ICD-10-CM | POA: Diagnosis present

## 2021-06-21 DIAGNOSIS — E871 Hypo-osmolality and hyponatremia: Secondary | ICD-10-CM | POA: Diagnosis present

## 2021-06-21 DIAGNOSIS — E785 Hyperlipidemia, unspecified: Secondary | ICD-10-CM | POA: Diagnosis present

## 2021-06-21 DIAGNOSIS — K439 Ventral hernia without obstruction or gangrene: Secondary | ICD-10-CM | POA: Diagnosis present

## 2021-06-22 DIAGNOSIS — E1122 Type 2 diabetes mellitus with diabetic chronic kidney disease: Secondary | ICD-10-CM | POA: Diagnosis present

## 2021-06-22 DIAGNOSIS — R109 Unspecified abdominal pain: Secondary | ICD-10-CM | POA: Diagnosis not present

## 2021-06-22 DIAGNOSIS — M069 Rheumatoid arthritis, unspecified: Secondary | ICD-10-CM | POA: Diagnosis present

## 2021-06-22 DIAGNOSIS — M47896 Other spondylosis, lumbar region: Secondary | ICD-10-CM | POA: Diagnosis present

## 2021-06-22 DIAGNOSIS — E441 Mild protein-calorie malnutrition: Secondary | ICD-10-CM | POA: Diagnosis present

## 2021-06-22 DIAGNOSIS — K219 Gastro-esophageal reflux disease without esophagitis: Secondary | ICD-10-CM | POA: Diagnosis present

## 2021-06-22 DIAGNOSIS — J189 Pneumonia, unspecified organism: Secondary | ICD-10-CM | POA: Diagnosis present

## 2021-06-22 DIAGNOSIS — J9601 Acute respiratory failure with hypoxia: Secondary | ICD-10-CM | POA: Diagnosis present

## 2021-06-22 DIAGNOSIS — E785 Hyperlipidemia, unspecified: Secondary | ICD-10-CM | POA: Diagnosis present

## 2021-06-22 DIAGNOSIS — J441 Chronic obstructive pulmonary disease with (acute) exacerbation: Secondary | ICD-10-CM | POA: Diagnosis present

## 2021-06-22 DIAGNOSIS — D1779 Benign lipomatous neoplasm of other sites: Secondary | ICD-10-CM | POA: Diagnosis present

## 2021-06-22 DIAGNOSIS — K439 Ventral hernia without obstruction or gangrene: Secondary | ICD-10-CM | POA: Diagnosis present

## 2021-06-22 DIAGNOSIS — E871 Hypo-osmolality and hyponatremia: Secondary | ICD-10-CM | POA: Diagnosis present

## 2021-06-22 DIAGNOSIS — R160 Hepatomegaly, not elsewhere classified: Secondary | ICD-10-CM | POA: Diagnosis present

## 2021-06-22 DIAGNOSIS — R6520 Severe sepsis without septic shock: Secondary | ICD-10-CM | POA: Diagnosis present

## 2021-06-22 DIAGNOSIS — R509 Fever, unspecified: Secondary | ICD-10-CM | POA: Diagnosis not present

## 2021-06-22 DIAGNOSIS — N179 Acute kidney failure, unspecified: Secondary | ICD-10-CM | POA: Diagnosis present

## 2021-06-22 DIAGNOSIS — E861 Hypovolemia: Secondary | ICD-10-CM | POA: Diagnosis present

## 2021-06-22 DIAGNOSIS — Z20822 Contact with and (suspected) exposure to covid-19: Secondary | ICD-10-CM | POA: Diagnosis present

## 2021-06-22 DIAGNOSIS — Z6841 Body Mass Index (BMI) 40.0 and over, adult: Secondary | ICD-10-CM | POA: Diagnosis not present

## 2021-06-22 DIAGNOSIS — L03311 Cellulitis of abdominal wall: Secondary | ICD-10-CM | POA: Diagnosis present

## 2021-06-22 DIAGNOSIS — E872 Acidosis, unspecified: Secondary | ICD-10-CM | POA: Diagnosis present

## 2021-06-22 DIAGNOSIS — J020 Streptococcal pharyngitis: Secondary | ICD-10-CM | POA: Diagnosis present

## 2021-06-22 DIAGNOSIS — R161 Splenomegaly, not elsewhere classified: Secondary | ICD-10-CM | POA: Diagnosis present

## 2021-06-22 DIAGNOSIS — I10 Essential (primary) hypertension: Secondary | ICD-10-CM | POA: Diagnosis present

## 2021-06-22 DIAGNOSIS — A419 Sepsis, unspecified organism: Secondary | ICD-10-CM | POA: Diagnosis present

## 2021-07-01 DIAGNOSIS — I2510 Atherosclerotic heart disease of native coronary artery without angina pectoris: Secondary | ICD-10-CM | POA: Diagnosis not present

## 2021-07-01 DIAGNOSIS — J9611 Chronic respiratory failure with hypoxia: Secondary | ICD-10-CM | POA: Diagnosis not present

## 2021-07-01 DIAGNOSIS — A419 Sepsis, unspecified organism: Secondary | ICD-10-CM | POA: Diagnosis not present

## 2021-07-05 DIAGNOSIS — D84821 Immunodeficiency due to drugs: Secondary | ICD-10-CM | POA: Insufficient documentation

## 2021-07-05 DIAGNOSIS — Z9989 Dependence on other enabling machines and devices: Secondary | ICD-10-CM | POA: Insufficient documentation

## 2021-07-05 DIAGNOSIS — L7682 Other postprocedural complications of skin and subcutaneous tissue: Secondary | ICD-10-CM | POA: Diagnosis not present

## 2021-07-05 DIAGNOSIS — K432 Incisional hernia without obstruction or gangrene: Secondary | ICD-10-CM | POA: Diagnosis not present

## 2021-07-05 DIAGNOSIS — Z79899 Other long term (current) drug therapy: Secondary | ICD-10-CM | POA: Insufficient documentation

## 2021-07-12 DIAGNOSIS — M0589 Other rheumatoid arthritis with rheumatoid factor of multiple sites: Secondary | ICD-10-CM | POA: Diagnosis not present

## 2021-07-12 DIAGNOSIS — Z79899 Other long term (current) drug therapy: Secondary | ICD-10-CM | POA: Diagnosis not present

## 2021-07-14 DIAGNOSIS — B351 Tinea unguium: Secondary | ICD-10-CM | POA: Diagnosis not present

## 2021-07-14 DIAGNOSIS — L851 Acquired keratosis [keratoderma] palmaris et plantaris: Secondary | ICD-10-CM | POA: Diagnosis not present

## 2021-07-14 DIAGNOSIS — E1342 Other specified diabetes mellitus with diabetic polyneuropathy: Secondary | ICD-10-CM | POA: Diagnosis not present

## 2021-07-23 DIAGNOSIS — H353221 Exudative age-related macular degeneration, left eye, with active choroidal neovascularization: Secondary | ICD-10-CM | POA: Diagnosis not present

## 2021-07-23 DIAGNOSIS — E119 Type 2 diabetes mellitus without complications: Secondary | ICD-10-CM | POA: Diagnosis not present

## 2021-08-16 DIAGNOSIS — Z9989 Dependence on other enabling machines and devices: Secondary | ICD-10-CM | POA: Diagnosis not present

## 2021-08-16 DIAGNOSIS — L7682 Other postprocedural complications of skin and subcutaneous tissue: Secondary | ICD-10-CM | POA: Diagnosis not present

## 2021-08-16 DIAGNOSIS — Z6841 Body Mass Index (BMI) 40.0 and over, adult: Secondary | ICD-10-CM | POA: Diagnosis not present

## 2021-08-16 DIAGNOSIS — D84821 Immunodeficiency due to drugs: Secondary | ICD-10-CM | POA: Diagnosis not present

## 2021-08-16 DIAGNOSIS — Z79899 Other long term (current) drug therapy: Secondary | ICD-10-CM | POA: Diagnosis not present

## 2021-08-18 ENCOUNTER — Ambulatory Visit: Payer: TRICARE For Life (TFL) | Admitting: Physician Assistant

## 2021-08-18 DIAGNOSIS — Z8631 Personal history of diabetic foot ulcer: Secondary | ICD-10-CM | POA: Diagnosis not present

## 2021-08-18 DIAGNOSIS — E1142 Type 2 diabetes mellitus with diabetic polyneuropathy: Secondary | ICD-10-CM | POA: Diagnosis not present

## 2021-08-23 DIAGNOSIS — M0589 Other rheumatoid arthritis with rheumatoid factor of multiple sites: Secondary | ICD-10-CM | POA: Diagnosis not present

## 2021-08-23 DIAGNOSIS — Z79899 Other long term (current) drug therapy: Secondary | ICD-10-CM | POA: Diagnosis not present

## 2021-08-24 DIAGNOSIS — H353221 Exudative age-related macular degeneration, left eye, with active choroidal neovascularization: Secondary | ICD-10-CM | POA: Diagnosis not present

## 2021-08-24 DIAGNOSIS — H43813 Vitreous degeneration, bilateral: Secondary | ICD-10-CM | POA: Diagnosis not present

## 2021-08-24 DIAGNOSIS — H353113 Nonexudative age-related macular degeneration, right eye, advanced atrophic without subfoveal involvement: Secondary | ICD-10-CM | POA: Diagnosis not present

## 2021-08-24 DIAGNOSIS — H35033 Hypertensive retinopathy, bilateral: Secondary | ICD-10-CM | POA: Diagnosis not present

## 2021-08-25 DIAGNOSIS — I10 Essential (primary) hypertension: Secondary | ICD-10-CM | POA: Diagnosis not present

## 2021-08-25 DIAGNOSIS — E1129 Type 2 diabetes mellitus with other diabetic kidney complication: Secondary | ICD-10-CM | POA: Diagnosis not present

## 2021-08-25 DIAGNOSIS — J45909 Unspecified asthma, uncomplicated: Secondary | ICD-10-CM | POA: Diagnosis not present

## 2021-08-25 DIAGNOSIS — D509 Iron deficiency anemia, unspecified: Secondary | ICD-10-CM | POA: Diagnosis not present

## 2021-08-25 DIAGNOSIS — Z79899 Other long term (current) drug therapy: Secondary | ICD-10-CM | POA: Diagnosis not present

## 2021-08-31 DIAGNOSIS — R7309 Other abnormal glucose: Secondary | ICD-10-CM | POA: Diagnosis not present

## 2021-08-31 DIAGNOSIS — M069 Rheumatoid arthritis, unspecified: Secondary | ICD-10-CM | POA: Diagnosis not present

## 2021-08-31 DIAGNOSIS — I10 Essential (primary) hypertension: Secondary | ICD-10-CM | POA: Diagnosis not present

## 2021-08-31 DIAGNOSIS — E785 Hyperlipidemia, unspecified: Secondary | ICD-10-CM | POA: Diagnosis not present

## 2021-08-31 DIAGNOSIS — E1129 Type 2 diabetes mellitus with other diabetic kidney complication: Secondary | ICD-10-CM | POA: Diagnosis not present

## 2021-09-08 DIAGNOSIS — Z6841 Body Mass Index (BMI) 40.0 and over, adult: Secondary | ICD-10-CM | POA: Diagnosis not present

## 2021-09-08 DIAGNOSIS — Z79899 Other long term (current) drug therapy: Secondary | ICD-10-CM | POA: Diagnosis not present

## 2021-09-08 DIAGNOSIS — M1991 Primary osteoarthritis, unspecified site: Secondary | ICD-10-CM | POA: Diagnosis not present

## 2021-09-08 DIAGNOSIS — L299 Pruritus, unspecified: Secondary | ICD-10-CM | POA: Diagnosis not present

## 2021-09-08 DIAGNOSIS — M545 Low back pain, unspecified: Secondary | ICD-10-CM | POA: Diagnosis not present

## 2021-09-08 DIAGNOSIS — M0589 Other rheumatoid arthritis with rheumatoid factor of multiple sites: Secondary | ICD-10-CM | POA: Diagnosis not present

## 2021-09-14 ENCOUNTER — Ambulatory Visit: Payer: Medicare Other | Attending: Internal Medicine

## 2021-09-14 ENCOUNTER — Ambulatory Visit (INDEPENDENT_AMBULATORY_CARE_PROVIDER_SITE_OTHER): Payer: Medicare Other | Admitting: Physician Assistant

## 2021-09-14 ENCOUNTER — Encounter: Payer: Self-pay | Admitting: Physician Assistant

## 2021-09-14 DIAGNOSIS — Z1283 Encounter for screening for malignant neoplasm of skin: Secondary | ICD-10-CM

## 2021-09-14 DIAGNOSIS — Z23 Encounter for immunization: Secondary | ICD-10-CM

## 2021-09-16 ENCOUNTER — Other Ambulatory Visit (HOSPITAL_BASED_OUTPATIENT_CLINIC_OR_DEPARTMENT_OTHER): Payer: Self-pay

## 2021-09-16 MED ORDER — PFIZER COVID-19 VAC BIVALENT 30 MCG/0.3ML IM SUSP
INTRAMUSCULAR | 0 refills | Status: DC
  Filled 2021-09-16: qty 0.3, 1d supply, fill #0

## 2021-09-16 NOTE — Progress Notes (Signed)
   Covid-19 Vaccination Clinic  Name:  [name]    MRN: [id number] DOB: 1950-07-28  09/16/2021  [name] was observed post Covid-19 immunization for 15 minutes without incident. She was provided with Vaccine Information Sheet and instruction to access the V-Safe system.   [name] was instructed to call 911 with any severe reactions post vaccine: Difficulty breathing  Swelling of face and throat  A fast heartbeat  A bad rash all over body  Dizziness and weakness   Immunizations Administered     Name Date Dose VIS Date Route   Pfizer Covid-19 Vaccine Bivalent Booster 09/14/2021  2:45 PM 0.3 mL 12/09/2020 Intramuscular   Manufacturer: [place]   Lot: [id number]   [place]: [id number]

## 2021-09-20 ENCOUNTER — Encounter: Payer: Self-pay | Admitting: Physician Assistant

## 2021-09-20 NOTE — Progress Notes (Signed)
   Follow-Up Visit   Subjective  [name] is a 71 y.o. female who presents for the following: Annual Exam (Patient here today for skin check no concerns. No personal history or family history of atypical moles, melanoma or non mole skin cancer. ).   The following portions of the chart were reviewed this encounter and updated as appropriate:  Tobacco  Allergies  Meds  Problems  Med Hx  Surg Hx  Fam Hx      Objective  Well appearing patient in no apparent distress; mood and affect are within normal limits.  A full examination was performed including scalp, head, eyes, ears, nose, lips, neck, chest, axillae, abdomen, back, buttocks, bilateral upper extremities, bilateral lower extremities, hands, feet, fingers, toes, fingernails, and toenails. All findings within normal limits unless otherwise noted below.  Full body skin examination- No atypical nevi or signs of NMSC noted at the time of the visit.    Assessment & Plan  Encounter for screening for malignant neoplasm of skin  Yearly skin exams    I, Kelsay Haggard, PA-C, have reviewed all documentation's for this visit.  The documentation on 09/20/21 for the exam, diagnosis, procedures and orders are all accurate and complete.

## 2021-09-22 DIAGNOSIS — E1342 Other specified diabetes mellitus with diabetic polyneuropathy: Secondary | ICD-10-CM | POA: Diagnosis not present

## 2021-09-22 DIAGNOSIS — L851 Acquired keratosis [keratoderma] palmaris et plantaris: Secondary | ICD-10-CM | POA: Diagnosis not present

## 2021-09-22 DIAGNOSIS — B351 Tinea unguium: Secondary | ICD-10-CM | POA: Diagnosis not present

## 2021-10-04 DIAGNOSIS — L03116 Cellulitis of left lower limb: Secondary | ICD-10-CM | POA: Diagnosis not present

## 2021-10-04 DIAGNOSIS — M0589 Other rheumatoid arthritis with rheumatoid factor of multiple sites: Secondary | ICD-10-CM | POA: Diagnosis not present

## 2021-10-11 DIAGNOSIS — L7682 Other postprocedural complications of skin and subcutaneous tissue: Secondary | ICD-10-CM | POA: Diagnosis not present

## 2021-10-11 DIAGNOSIS — K432 Incisional hernia without obstruction or gangrene: Secondary | ICD-10-CM | POA: Diagnosis not present

## 2021-10-19 ENCOUNTER — Encounter: Payer: Self-pay | Admitting: Pulmonary Disease

## 2021-10-19 ENCOUNTER — Telehealth: Payer: Self-pay | Admitting: Pulmonary Disease

## 2021-10-19 ENCOUNTER — Ambulatory Visit (INDEPENDENT_AMBULATORY_CARE_PROVIDER_SITE_OTHER): Payer: Medicare Other | Admitting: Pulmonary Disease

## 2021-10-19 ENCOUNTER — Ambulatory Visit (HOSPITAL_COMMUNITY)
Admission: RE | Admit: 2021-10-19 | Discharge: 2021-10-19 | Disposition: A | Discharge status: Home / Self Care | Payer: Medicare Other | Source: Ambulatory Visit | Attending: Pulmonary Disease | Admitting: Pulmonary Disease

## 2021-10-19 VITALS — BP 136/88 | HR 78 | Temp 98.9°F | Ht 61.5 in | Wt 263.2 lb

## 2021-10-19 DIAGNOSIS — R052 Subacute cough: Secondary | ICD-10-CM | POA: Diagnosis not present

## 2021-10-19 DIAGNOSIS — G4733 Obstructive sleep apnea (adult) (pediatric): Secondary | ICD-10-CM | POA: Diagnosis not present

## 2021-10-19 DIAGNOSIS — R059 Cough, unspecified: Secondary | ICD-10-CM | POA: Diagnosis not present

## 2021-10-19 DIAGNOSIS — R911 Solitary pulmonary nodule: Secondary | ICD-10-CM | POA: Diagnosis not present

## 2021-10-19 DIAGNOSIS — J449 Chronic obstructive pulmonary disease, unspecified: Secondary | ICD-10-CM | POA: Diagnosis not present

## 2021-10-19 MED ORDER — FLUTICASONE PROPIONATE 50 MCG/ACT NA SUSP: 1.0000 | Freq: Every day | NASAL | 3 refills | Status: DC

## 2021-10-19 MED ORDER — AMOXICILLIN-POT CLAVULANATE 875-125 MG PO TABS: 1.0000 | ORAL_TABLET | Freq: Two times a day (BID) | ORAL | 0 refills | Status: DC

## 2021-10-19 NOTE — Telephone Encounter (Signed)
CPAP 09/19/21 to 10/18/21 >> used on 30 of 30 nights with average 7 hrs 55 min.  Average AHI 2.4 with CPAP 9 cm H2O   Please let her know her CPAP report shows good control with current settings.

## 2021-10-19 NOTE — Progress Notes (Addendum)
[facility] [facility]  Chief Complaint  Patient presents with   Follow-up    [facility]  CPAP working well.  [facility]      Constitutional:  BP 136/88 (BP Location: Left Arm, Patient Position: Sitting)   Pulse 78   Temp 98.9 F (37.2 C) (Temporal)   Ht 5' 1.5" (1.562 m)   Wt 263 lb 3.2 oz (119.4 kg)   SpO2 95% Comment: ra  BMI 48.93 kg/m   Past Medical History:  RA, HTN, HLD, GERD, DM, Depression  Past Surgical History:  Her  has a past surgical history that includes Total knee arthroplasty (05/07/2010); Revision total knee arthroplasty (06/09/2010); Appendectomy (1967); Abdominal hysterectomy (1992); Cholecystectomy (1992); Aorto-pulmonary window repair (1992); Hernia repair (2001); Hernia repair (2007); Abdominal wall mesh  removal (08/13/2009); Foreign body removal abdominal (01/04/2010); Colonoscopy (12/08/2011); Bone biopsy (Right, 04/19/2016); Colonoscopy with propofol (N/A, 10/16/2017); polypectomy (10/16/2017); Esophagogastroduodenoscopy (egd) with propofol (N/A, 10/29/2018); Colonoscopy with propofol (N/A, 10/29/2018); polypectomy (10/29/2018); and biopsy (10/29/2018).  Brief Summary:  [name] is a 71 y.o. female former smoker with obstructive sleep apnea and COPD.      Subjective:   She was in [facility] in March with pneumonia and sepsis.    For the past two days she has low grade temperature elevation.  This is associated with cough and clear to yellow sputum.  She feels sore in her right chest.  Using CPAP nightly.  No issues with mask or pressure.  Not having sinus congestion, or sore throat.  Physical Exam:   Appearance - well kempt   ENMT - no sinus tenderness, no oral exudate, no LAN, Mallampati 3 airway, no stridor  Respiratory - bilateral rhonchi  CV - s1s2 regular rate and rhythm, no murmurs  Ext - no clubbing, no edema  Skin - no  rashes  Psych - normal mood and affect     Pulmonary testing:  PFT 09/18/07 >> FEV1 1.38 (61%), FEV1% 59, TLC 4.48 (95%), DLCO 80%, + BD PFT 08/09/17 >> FEV1 1.35 (61%), FEV1% 69, TLC 4.67 (98%), DLCO 97%  Chest Imaging:  HRCT chest 08/17/17 >> calcification RV, atherosclerosis, scarring, mild air trapping, micronodularity LLL, 1.4 cm nodule LUL, fatty liver CT chest 11/29/17 >> LUL nodule resolved HRCT chest 09/11/19 >> mild basilar subpleural scarring, 1.4 x 1.1 area of GGO LUL CT chest 09/17/20 >> mild centrilobular emphysema, LUL GGO 1 x 1.2 cm (no change)  Sleep Tests:  PSG 09/02/07 >> AHI 9 CPAP 03/02/21 to 03/31/21 >> used on 30 of 30 nights with average 7 hrs 33 min.  Average AHI 3 with CPAP 6 cm H2O  Cardiac Tests:  Echo 09/01/17 >> EF 65 to 70%, mild AS, severe MV calcification  Social History:  She  reports that she quit smoking about 29 years ago. Her smoking use included cigarettes. She has a 50.00 pack-year smoking history. She has never used smokeless tobacco. She reports that she does not drink alcohol and does not use drugs.  Family History:  Her family history includes Arthritis in her father; COPD in her father; Cancer in her mother; Diabetes in her father and mother; Hyperlipidemia in her brother and mother; Hypertension in her brother; Thyroid disease in her mother. She was adopted.     Assessment/Plan:   Subacute cough. - concerning for bacterial bronchitis - will give course of augmentin for 7 days -  chest xray today  COPD with chronic bronchitis. - continue trelegy 100 one puff daily, singulair - prn albuterol - she has a nebulizer and flutter valve  Post nasal drip with upper airway cough. - nasal irrigation, flonase, claritin, singulair   Obstructive sleep apnea. 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 - f/u CT  chest without contrast in June 2024   Obesity. - discussed importance of weight loss   Rheumatoid arthritis. - followed by Dr. Amil Amen with rheumatology - on MTX, remicade  Time Spent Involved in Patient Care on Day of Examination:  38 minutes  Follow up:   Patient Instructions  Chest xray today at [facility]  Augmentin antibiotic twice per day for 7 days  Follow up in 6 months  Medication List:   Allergies as of 10/19/2021       Reactions   Demerol Nausea And Vomiting   Severe   Hydromorphone Hcl Itching   All over the body.   Tetracycline Hives        Medication List        Accurate as of October 19, 2021 12:20 PM. If you have any questions, ask your nurse or doctor.          acetaminophen 325 MG tablet Commonly known as: TYLENOL Take 650 mg by mouth every 6 (six) hours as needed (for pain.).   albuterol (2.5 MG/3ML) 0.083% nebulizer solution Commonly known as: PROVENTIL Take 3 mLs (2.5 mg total) by nebulization every 6 (six) hours as needed for wheezing or shortness of breath (dx: J43.9).   albuterol 108 (90 Base) MCG/ACT inhaler Commonly known as: ProAir HFA INHALE 2 PUFFS INTO THE LUNGS EVERY 6 HOURS AS NEEDED FOR WHEEZING OR SHORTNESS OF BREATH   ALPRAZolam 0.5 MG tablet Commonly known as: XANAX Take 0.5 mg by mouth 3 (three) times daily as needed for sleep or anxiety.   amoxicillin-clavulanate 875-125 MG tablet Commonly known as: AUGMENTIN Take 1 tablet by mouth 2 (two) times daily. Started by: [name], MD   Benicar 20 MG tablet Generic drug: olmesartan Take 20 mg by mouth daily.   diclofenac sodium 1 % Gel Commonly known as: VOLTAREN Apply 2-4 g topically 4 (four) times daily as needed for pain.   empagliflozin 25 MG Tabs tablet Commonly known as: JARDIANCE Take 25 mg by mouth daily.   fluconazole 150 MG tablet Commonly known as: DIFLUCAN Take 150 mg by mouth once.   fluticasone 50 MCG/ACT nasal spray Commonly known  as: FLONASE Place 1 spray into both nostrils daily.   folic acid 1 MG tablet Commonly known as: FOLVITE Take 1 mg by mouth daily.   furosemide 40 MG tablet Commonly known as: LASIX Take 40 mg by mouth daily as needed for fluid. Fluid   gabapentin 600 MG tablet Commonly known as: NEURONTIN Take 600 mg by mouth at bedtime.   Gerhardt's butt cream Crea Apply 1 application topically 2 (two) times daily.   HumaLOG KwikPen 100 UNIT/ML KwikPen Generic drug: insulin lispro Inject 8-14 Units into the skin See admin instructions. Inject 8 units subcutaneously in the morning & inject 14 units subcutaneously at supper   IRON PO Take by mouth.   Lantus SoloStar 100 UNIT/ML Solostar Pen Generic drug: insulin glargine Inject into the skin 2 (two) times a day. Inject 50 units under  the skin in the morning and 60 units in the evening.   loratadine 10 MG tablet Commonly known as: CLARITIN Take 10 mg by mouth daily.   Lubricant Eye Drops 0.5 % Soln Generic drug: carboxymethylcellulose 1 drop 3 (three) times daily as needed.   metFORMIN 500 MG 24 hr tablet Commonly known as: GLUCOPHAGE-XR Take 2,000 mg by mouth daily.   methotrexate 2.5 MG tablet Commonly known as: RHEUMATREX Take 2.5 mg by mouth once a week. Caution:Chemotherapy. Protect from light.   montelukast 10 MG tablet Commonly known as: SINGULAIR TAKE 1 TABLET AT BEDTIME   omeprazole 40 MG capsule Commonly known as: PRILOSEC TAKE 1 CAPSULE EVERY MORNING   Pfizer COVID-19 Vac Bivalent injection Generic drug: COVID-19 mRNA bivalent vaccine (Pfizer) Inject into the muscle.   REMICADE IV Inject 1 Dose into the vein every 6 (six) weeks.   sertraline 50 MG tablet Commonly known as: ZOLOFT Take 50 mg by mouth daily.   simvastatin 40 MG tablet Commonly known as: ZOCOR Take 40 mg by mouth daily with supper.   Trelegy Ellipta 100-62.5-25 MCG/ACT Aepb Generic drug: Fluticasone-Umeclidin-Vilant Inhale 1 puff into the  lungs daily.   Trulicity 0.04 HT/9.7FS Sopn Generic drug: Dulaglutide SMARTSIG:0.5 Milliliter(s) SUB-Q Once a Week        Signature:  [name], MD [place] Pager - [phone] 10/19/2021, 12:20 PM

## 2021-10-19 NOTE — Patient Instructions (Signed)
Chest xray today at [facility]  Augmentin antibiotic twice per day for 7 days  Follow up in 6 months

## 2021-10-19 NOTE — Telephone Encounter (Signed)
Called and notified patient. Made her aware we were able to pull up her CPAP reports now in care orchestrator. Nothing further needed.

## 2021-10-27 DIAGNOSIS — Z794 Long term (current) use of insulin: Secondary | ICD-10-CM | POA: Diagnosis not present

## 2021-10-27 DIAGNOSIS — Z7984 Long term (current) use of oral hypoglycemic drugs: Secondary | ICD-10-CM | POA: Diagnosis not present

## 2021-10-27 DIAGNOSIS — Z7985 Long-term (current) use of injectable non-insulin antidiabetic drugs: Secondary | ICD-10-CM | POA: Diagnosis not present

## 2021-10-27 DIAGNOSIS — E1142 Type 2 diabetes mellitus with diabetic polyneuropathy: Secondary | ICD-10-CM | POA: Diagnosis not present

## 2021-11-02 ENCOUNTER — Encounter: Payer: Self-pay | Admitting: Pulmonary Disease

## 2021-11-08 ENCOUNTER — Other Ambulatory Visit: Payer: Self-pay | Admitting: Pulmonary Disease

## 2021-11-16 DIAGNOSIS — H43813 Vitreous degeneration, bilateral: Secondary | ICD-10-CM | POA: Diagnosis not present

## 2021-11-16 DIAGNOSIS — H353221 Exudative age-related macular degeneration, left eye, with active choroidal neovascularization: Secondary | ICD-10-CM | POA: Diagnosis not present

## 2021-11-16 DIAGNOSIS — H353113 Nonexudative age-related macular degeneration, right eye, advanced atrophic without subfoveal involvement: Secondary | ICD-10-CM | POA: Diagnosis not present

## 2021-11-23 DIAGNOSIS — M0589 Other rheumatoid arthritis with rheumatoid factor of multiple sites: Secondary | ICD-10-CM | POA: Diagnosis not present

## 2021-11-23 DIAGNOSIS — Z79899 Other long term (current) drug therapy: Secondary | ICD-10-CM | POA: Diagnosis not present

## 2021-11-25 DIAGNOSIS — I10 Essential (primary) hypertension: Secondary | ICD-10-CM | POA: Diagnosis not present

## 2021-11-25 DIAGNOSIS — E785 Hyperlipidemia, unspecified: Secondary | ICD-10-CM | POA: Diagnosis not present

## 2021-11-25 DIAGNOSIS — E1129 Type 2 diabetes mellitus with other diabetic kidney complication: Secondary | ICD-10-CM | POA: Diagnosis not present

## 2021-12-01 DIAGNOSIS — E1129 Type 2 diabetes mellitus with other diabetic kidney complication: Secondary | ICD-10-CM | POA: Diagnosis not present

## 2021-12-01 DIAGNOSIS — R739 Hyperglycemia, unspecified: Secondary | ICD-10-CM | POA: Diagnosis not present

## 2021-12-01 DIAGNOSIS — I10 Essential (primary) hypertension: Secondary | ICD-10-CM | POA: Diagnosis not present

## 2021-12-08 DIAGNOSIS — L84 Corns and callosities: Secondary | ICD-10-CM | POA: Diagnosis not present

## 2021-12-08 DIAGNOSIS — B351 Tinea unguium: Secondary | ICD-10-CM | POA: Diagnosis not present

## 2021-12-08 DIAGNOSIS — E1342 Other specified diabetes mellitus with diabetic polyneuropathy: Secondary | ICD-10-CM | POA: Diagnosis not present

## 2021-12-28 ENCOUNTER — Other Ambulatory Visit (HOSPITAL_COMMUNITY): Payer: Self-pay | Admitting: Internal Medicine

## 2021-12-28 DIAGNOSIS — Z1231 Encounter for screening mammogram for malignant neoplasm of breast: Secondary | ICD-10-CM

## 2022-01-01 DIAGNOSIS — R509 Fever, unspecified: Secondary | ICD-10-CM | POA: Diagnosis not present

## 2022-01-01 DIAGNOSIS — J189 Pneumonia, unspecified organism: Secondary | ICD-10-CM | POA: Diagnosis not present

## 2022-01-01 DIAGNOSIS — R053 Chronic cough: Secondary | ICD-10-CM | POA: Diagnosis not present

## 2022-01-03 ENCOUNTER — Encounter: Payer: Self-pay | Admitting: Pulmonary Disease

## 2022-01-03 NOTE — Telephone Encounter (Signed)
"  I started getting sick last Wednesday night running fevers and chills. Since fever had not gone away by Saturday I went to [name] in [place]. They said I had right lowered libe pneumonia and prescribed Levaquin.  This is the 3rd time since March. Do I need to come in to see him?"  Dr. Halford Chessman please advise

## 2022-01-03 NOTE — Telephone Encounter (Signed)
Please schedule next available ROV with me or an NP.

## 2022-01-06 ENCOUNTER — Ambulatory Visit (INDEPENDENT_AMBULATORY_CARE_PROVIDER_SITE_OTHER): Payer: Medicare Other | Admitting: Primary Care

## 2022-01-06 ENCOUNTER — Ambulatory Visit (INDEPENDENT_AMBULATORY_CARE_PROVIDER_SITE_OTHER): Payer: Medicare Other

## 2022-01-06 ENCOUNTER — Encounter: Payer: Self-pay | Admitting: Primary Care

## 2022-01-06 ENCOUNTER — Other Ambulatory Visit: Payer: Self-pay | Admitting: *Deleted

## 2022-01-06 VITALS — BP 114/62 | HR 80 | Temp 98.0°F | Ht 61.5 in | Wt 257.6 lb

## 2022-01-06 DIAGNOSIS — G4733 Obstructive sleep apnea (adult) (pediatric): Secondary | ICD-10-CM | POA: Diagnosis not present

## 2022-01-06 DIAGNOSIS — J439 Emphysema, unspecified: Secondary | ICD-10-CM | POA: Diagnosis not present

## 2022-01-06 DIAGNOSIS — J189 Pneumonia, unspecified organism: Secondary | ICD-10-CM | POA: Diagnosis not present

## 2022-01-06 DIAGNOSIS — Z8701 Personal history of pneumonia (recurrent): Secondary | ICD-10-CM

## 2022-01-06 MED ORDER — PREDNISONE 10 MG PO TABS: ORAL_TABLET | ORAL | 0 refills | Status: DC

## 2022-01-06 NOTE — Progress Notes (Addendum)
$'@Patient'v$  ID: [name], female    DOB: 08/18/1950, 71 y.o.   MRN: [id number]  Chief Complaint  Patient presents with   Acute Visit    Sick x 1 wk., pneumonia over wkend, increased sob, cough-thick green x 1 wk., no energy, fever was 100 stopped 4 days ago. CPAP good    Referring provider: Asencion Noble, MD  HPI: 71 year old female, former smoker quit 1994 (50-pack-year history).  Past medical history significant for COPD, obstructive sleep apnea, cardiac murmur, hyperlipidemia, morbid obesity, rheumatoid arthritis.  Patient of Dr. Halford Chessman, last seen in office on 10/19/2021.  01/06/2022- Interim hx  Patient presents today for acute office visit. She develop a cough 1 week ago with  fever. She was diagnosed with right lower lobe pneumonia 5 days ago at [facility] in [place]. CXR showed increased density right lateral costophrenic angle. She was prescribed 10 day course of Levaquin. She continues to have productive cough with green mucus x1 week. She has noticed slight improvement in amount of sputum she is coughing up. Associated shortness of breath and wheezing. No longer having fever or chills.  She is compliant with Trelegy 100 mcg 1 puff daily. She was taking mucinex several weeks ago but has not taken since she was dx with PNA. She did not think to start using flutter valve and has not tried her nebulizer.    Imaging: 09/17/20 C T chest>> Similar size of a ground-glass left upper lobe pulmonary nodule. Per consensus criteria, this warrants follow-up with chest CT at 2 years. This recommendation follows the consensus statement: Guidelines for Management of Incidental Pulmonary Nodules Detected on CT Images:From the Fleischner Society 2017; published online before print (10.1148/[id number]).  Allergies  Allergen Reactions   Demerol Nausea And Vomiting    Severe   Hydromorphone Hcl Itching    All over the body.   Tetracycline Hives    Immunization History  Administered Date(s)  Administered   Fluad Quad(high Dose 65+) 01/06/2020   Influenza Split 01/10/2012, 01/05/2013, 01/09/2017   Influenza Whole 01/31/2009, 01/09/2010, 01/09/2011   Influenza, High Dose Seasonal PF 12/28/2017   Influenza,inj,Quad PF,6+ Mos 12/10/2013, 12/23/2015   Influenza-Unspecified 12/26/2014, 01/17/2017, 12/22/2018, 02/04/2021   PFIZER Comirnaty(Gray Top)Covid-19 Tri-Sucrose Vaccine 08/04/2020   PFIZER(Purple Top)SARS-COV-2 Vaccination 05/25/2019, 06/17/2019, 12/10/2019, 02/14/2021   Pfizer Covid-19 Vaccine Bivalent Booster 62yr & up 09/14/2021   Pneumococcal Conjugate-13 11/10/2015   Pneumococcal Polysaccharide-23 02/17/2008, 02/23/2017   Tetanus 03/30/2021   Zoster, Live 02/24/2021, 05/20/2021    Past Medical History:  Diagnosis Date   Asthma    COPD (chronic obstructive pulmonary disease) ([facility]    Depression    DM type 1 (diabetes mellitus, type 1) (HCC)    GERD (gastroesophageal reflux disease)    Hyperlipidemia    Hypertension    Morbid obesity ([facility]    Obstructive sleep apnea syndrome    Osteoarthritis of right knee 04/2010   end-stage   PONV (postoperative nausea and vomiting)    Rheumatoid arthritis(714.0)    on methotrexate therapy   Septic arthritis of knee, right ([facility] 04/2010    Tobacco History: Social History   Tobacco Use  Smoking Status Former   Packs/day: 2.00   Years: 25.00   Total pack years: 50.00   Types: Cigarettes   Quit date: 04/11/1992   Years since quitting: 29.7  Smokeless Tobacco Never   Counseling given: Not Answered   Outpatient Medications Prior to Visit  Medication Sig Dispense Refill   acetaminophen (TYLENOL) 325 MG tablet Take  650 mg by mouth every 6 (six) hours as needed (for pain.).     albuterol (PROVENTIL) (2.5 MG/3ML) 0.083% nebulizer solution Take 3 mLs (2.5 mg total) by nebulization every 6 (six) hours as needed for wheezing or shortness of breath (dx: J43.9). 360 mL 5   albuterol (VENTOLIN HFA) 108 (90 Base) MCG/ACT  inhaler USE 2 INHALATIONS EVERY 6 HOURS AS NEEDED FOR WHEEZING OR SHORTNESS OF BREATH 25.5 g 3   ALPRAZolam (XANAX) 0.5 MG tablet Take 0.5 mg by mouth 3 (three) times daily as needed for sleep or anxiety.      BENICAR 20 MG tablet Take 20 mg by mouth daily.      carboxymethylcellulose (LUBRICANT EYE DROPS) 0.5 % SOLN 1 drop 3 (three) times daily as needed.     diclofenac sodium (VOLTAREN) 1 % GEL Apply 2-4 g topically 4 (four) times daily as needed for pain.     empagliflozin (JARDIANCE) 25 MG TABS tablet Take 25 mg by mouth daily.     Ferrous Sulfate (IRON PO) Take by mouth.     fluconazole (DIFLUCAN) 150 MG tablet Take 150 mg by mouth once.     fluticasone (FLONASE) 50 MCG/ACT nasal spray Place 1 spray into both nostrils daily. 48 g 3   folic acid (FOLVITE) 1 MG tablet Take 1 mg by mouth daily.     furosemide (LASIX) 40 MG tablet Take 40 mg by mouth daily as needed for fluid. Fluid     gabapentin (NEURONTIN) 600 MG tablet Take 600 mg by mouth at bedtime.     HUMALOG KWIKPEN 100 UNIT/ML KwikPen Inject 8-14 Units into the skin See admin instructions. Inject 8 units subcutaneously in the morning & inject 14 units subcutaneously at supper     InFLIXimab (REMICADE IV) Inject 1 Dose into the vein every 6 (six) weeks.      LANTUS SOLOSTAR 100 UNIT/ML Solostar Pen Inject into the skin 2 (two) times a day. Inject 50 units under the skin in the morning and 60 units in the evening.     levofloxacin (LEVAQUIN) 750 MG tablet Take 750 mg by mouth daily.     loratadine (CLARITIN) 10 MG tablet Take 10 mg by mouth daily.     metFORMIN (GLUCOPHAGE-XR) 500 MG 24 hr tablet Take 2,000 mg by mouth daily.     methotrexate (RHEUMATREX) 2.5 MG tablet Take 2.5 mg by mouth once a week. Caution:Chemotherapy. Protect from light.     montelukast (SINGULAIR) 10 MG tablet TAKE 1 TABLET AT BEDTIME 90 tablet 3   Nystatin (GERHARDT'S BUTT CREAM) CREA Apply 1 application topically 2 (two) times daily. 1 each PRN   omeprazole  (PRILOSEC) 40 MG capsule TAKE 1 CAPSULE EVERY MORNING 90 capsule 3   sertraline (ZOLOFT) 50 MG tablet Take 50 mg by mouth daily.      simvastatin (ZOCOR) 40 MG tablet Take 40 mg by mouth daily with supper.     TRELEGY ELLIPTA 100-62.5-25 MCG/ACT AEPB Inhale 1 puff into the lungs daily.     TRULICITY 6.57 QI/6.9GE SOPN SMARTSIG:0.5 Milliliter(s) SUB-Q Once a Week     amoxicillin-clavulanate (AUGMENTIN) 875-125 MG tablet Take 1 tablet by mouth 2 (two) times daily. 14 tablet 0   COVID-19 mRNA bivalent vaccine, Pfizer, (PFIZER COVID-19 VAC BIVALENT) injection Inject into the muscle. 0.3 mL 0   No facility-administered medications prior to visit.   Review of Systems  Review of Systems  Constitutional: Negative.  Negative for chills and fever.  HENT: Negative.  Respiratory:  Positive for cough, shortness of breath and wheezing.      Physical Exam  BP 114/62 (BP Location: Left Arm, Cuff Size: Large)   Pulse 80   Temp 98 F (36.7 C) (Temporal)   Ht 5' 1.5" (1.562 m)   Wt 257 lb 9.6 oz (116.8 kg)   SpO2 93%   BMI 47.88 kg/m  Physical Exam Constitutional:      Appearance: Normal appearance.  HENT:     Head: Normocephalic and atraumatic.  Cardiovascular:     Rate and Rhythm: Normal rate and regular rhythm.  Pulmonary:     Effort: Pulmonary effort is normal.     Breath sounds: Wheezing present.     Comments: O2 93% RA Skin:    General: Skin is warm and dry.  Neurological:     General: No focal deficit present.     Mental Status: She is alert and oriented to person, place, and time. Mental status is at baseline.  Psychiatric:        Mood and Affect: Mood normal.        Behavior: Behavior normal.        Thought Content: Thought content normal.        Judgment: Judgment normal.      Lab Results:  CBC    Component Value Date/Time   WBC 5.2 07/30/2017 0346   RBC 5.56 (H) 07/30/2017 0346   HGB 12.9 07/30/2017 0346   HCT 42.4 07/30/2017 0346   PLT 213 07/30/2017 0346    MCV 76.3 (L) 07/30/2017 0346   MCH 23.2 (L) 07/30/2017 0346   MCHC 30.4 07/30/2017 0346   RDW 17.4 (H) 07/30/2017 0346   LYMPHSABS 1.2 07/29/2017 1631   MONOABS 0.4 07/29/2017 1631   EOSABS 0.0 07/29/2017 1631   BASOSABS 0.0 07/29/2017 1631    BMET    Component Value Date/Time   NA 138 07/30/2017 0346   K 4.1 07/30/2017 0346   CL 99 (L) 07/30/2017 0346   CO2 24 07/30/2017 0346   GLUCOSE 135 (H) 12/10/2019 1347   BUN 31 (H) 07/30/2017 0346   CREATININE 0.92 07/30/2017 0346   CREATININE 0.84 05/10/2016 1347   CALCIUM 8.9 07/30/2017 0346   GFRNONAA >60 07/30/2017 0346   GFRAA >60 07/30/2017 0346    BNP    Component Value Date/Time   BNP 15.7 07/29/2017 1631    ProBNP    Component Value Date/Time   PROBNP 40.5 02/15/2008 1205    Imaging: DG Chest 2 View  Result Date: 01/06/2022 CLINICAL DATA:  Follow-up pneumonia EXAM: CHEST - 2 VIEW COMPARISON:  Chest x-ray dated October 19, 2021 FINDINGS: Cardiac and mediastinal contours within normal limits. Mild consolidation of the right costophrenic angle. No evidence of pleural effusion or pneumothorax. IMPRESSION: Mild consolidation of the right costophrenic angle, concerning for pneumonia. Recommend follow-up chest x-ray 6-8 weeks to ensure resolution. Electronically Signed   By: Yetta Glassman M.D.   On: 01/06/2022 10:41     Assessment & Plan:   CAP (community acquired pneumonia) - Patient developed productive cough with fever 1 week ago. CXR at [facility] showed increased density right lateral costophrenic angle. She is currently on 10 day course of Levaquin. Sputum production has lessened but remains purulent. She continues to have some shortness of breath and wheezing. No longer having fever. She has wheezing on exam. O2 93% RA. Will repeat CXR today to monitor RUL pneumonia and check sputum culture. Continue Levaquin x 10 days as directed  by UC. Advised she resume Mucinex 600-'1200mg'$  twice daily and use flutter valve three times a day.  We will send in prednisone taper d.t wheezing. Recommend she use albuterol nebulizer 2-3 times a day for the next week. Call if symptoms worsen or do not improve with above plan. Recommend short term follow-up IN 4 weeks with repeat imaging.  COPD (chronic obstructive pulmonary disease) with emphysema (HCC) - Continue Trelegy 176mg one puff daily - Use Albuterol nebulizer every 6 hours for breakthrough sob/wheezing - Prednisone taper as prescribed for wheezing   Obstructive sleep apnea - Patient remains compliant with CPAP  40 mins spent on case; > 50% face to face with patient   EMartyn Ehrich NP 01/08/2022

## 2022-01-06 NOTE — Patient Instructions (Addendum)
  Recommendations: - Start mucinex 600-'1200mg'$  twice a day for 5-7 days  - Use flutter valve three times a day to loosen congestion  - Use albuterol nebulizer 2-3 times a day for the next week  - Continue Levaquin until completed  - Take prednisone taper as directed, if BS consistently >300 call office and we will cut back steroids  - Call if symptoms are not improving   Orders: - Sputum sample  Follow-up: - Please scheduled visit with Dr. Halford Chessman in [place] in 4 weeks / you will need repeat CXR at follow-up

## 2022-01-07 ENCOUNTER — Other Ambulatory Visit: Payer: Self-pay

## 2022-01-07 DIAGNOSIS — Z8701 Personal history of pneumonia (recurrent): Secondary | ICD-10-CM

## 2022-01-07 NOTE — Progress Notes (Signed)
Patient dropped off sputum culture.  Ordered for quest. Reordered sample and delivered to dana upstairs with quest.

## 2022-01-08 DIAGNOSIS — J189 Pneumonia, unspecified organism: Secondary | ICD-10-CM | POA: Insufficient documentation

## 2022-01-08 NOTE — Assessment & Plan Note (Signed)
-   Patient remains compliant with CPAP

## 2022-01-08 NOTE — Assessment & Plan Note (Signed)
-   Continue Trelegy 115mg one puff daily - Use Albuterol nebulizer every 6 hours for breakthrough sob/wheezing - Prednisone taper as prescribed for wheezing

## 2022-01-08 NOTE — Assessment & Plan Note (Addendum)
-   Patient developed productive cough with fever 1 week ago. CXR at [facility] showed increased density right lateral costophrenic angle. She is currently on 10 day course of Levaquin. Sputum production has lessened but remains purulent. She continues to have some shortness of breath and wheezing. No longer having fever. She has wheezing on exam. O2 93% RA. Will repeat CXR today to monitor RUL pneumonia and check sputum culture. Continue Levaquin x 10 days as directed by UC. Advised she resume Mucinex 600-'1200mg'$  twice daily and use flutter valve three times a day. We will send in prednisone taper d.t wheezing. Recommend she use albuterol nebulizer 2-3 times a day for the next week. Call if symptoms worsen or do not improve with above plan. Recommend short term follow-up IN 4 weeks with repeat imaging.

## 2022-01-10 LAB — RESPIRATORY CULTURE OR RESPIRATORY AND SPUTUM CULTURE
MICRO NUMBER:: 13991675
RESULT:: NORMAL
SPECIMEN QUALITY:: ADEQUATE

## 2022-01-11 NOTE — Progress Notes (Signed)
Sputum grew out normal oropharyngeal flora. No bacteria

## 2022-01-12 DIAGNOSIS — E1142 Type 2 diabetes mellitus with diabetic polyneuropathy: Secondary | ICD-10-CM | POA: Diagnosis not present

## 2022-01-12 DIAGNOSIS — Z8631 Personal history of diabetic foot ulcer: Secondary | ICD-10-CM | POA: Diagnosis not present

## 2022-01-17 DIAGNOSIS — Z111 Encounter for screening for respiratory tuberculosis: Secondary | ICD-10-CM | POA: Diagnosis not present

## 2022-01-17 DIAGNOSIS — Z79899 Other long term (current) drug therapy: Secondary | ICD-10-CM | POA: Diagnosis not present

## 2022-01-17 DIAGNOSIS — M0589 Other rheumatoid arthritis with rheumatoid factor of multiple sites: Secondary | ICD-10-CM | POA: Diagnosis not present

## 2022-01-17 DIAGNOSIS — R5383 Other fatigue: Secondary | ICD-10-CM | POA: Diagnosis not present

## 2022-01-18 DIAGNOSIS — Z79899 Other long term (current) drug therapy: Secondary | ICD-10-CM | POA: Diagnosis not present

## 2022-01-18 DIAGNOSIS — L7682 Other postprocedural complications of skin and subcutaneous tissue: Secondary | ICD-10-CM | POA: Diagnosis not present

## 2022-01-18 DIAGNOSIS — Z9989 Dependence on other enabling machines and devices: Secondary | ICD-10-CM | POA: Diagnosis not present

## 2022-01-18 DIAGNOSIS — K432 Incisional hernia without obstruction or gangrene: Secondary | ICD-10-CM | POA: Diagnosis not present

## 2022-01-18 DIAGNOSIS — D84821 Immunodeficiency due to drugs: Secondary | ICD-10-CM | POA: Diagnosis not present

## 2022-02-09 ENCOUNTER — Other Ambulatory Visit: Payer: Self-pay | Admitting: Pulmonary Disease

## 2022-02-11 ENCOUNTER — Ambulatory Visit (HOSPITAL_COMMUNITY)
Admission: RE | Admit: 2022-02-11 | Discharge: 2022-02-11 | Disposition: A | Discharge status: Home / Self Care | Payer: Medicare Other | Source: Ambulatory Visit | Attending: Primary Care | Admitting: Primary Care

## 2022-02-11 ENCOUNTER — Ambulatory Visit (HOSPITAL_COMMUNITY)
Admission: RE | Admit: 2022-02-11 | Discharge: 2022-02-11 | Disposition: A | Discharge status: Home / Self Care | Payer: Medicare Other | Source: Ambulatory Visit | Attending: Internal Medicine | Admitting: Internal Medicine

## 2022-02-11 DIAGNOSIS — Z1231 Encounter for screening mammogram for malignant neoplasm of breast: Secondary | ICD-10-CM | POA: Insufficient documentation

## 2022-02-11 DIAGNOSIS — J189 Pneumonia, unspecified organism: Secondary | ICD-10-CM | POA: Diagnosis not present

## 2022-02-11 DIAGNOSIS — Z8701 Personal history of pneumonia (recurrent): Secondary | ICD-10-CM | POA: Diagnosis not present

## 2022-02-14 DIAGNOSIS — J439 Emphysema, unspecified: Secondary | ICD-10-CM

## 2022-02-15 ENCOUNTER — Other Ambulatory Visit: Payer: Self-pay | Admitting: Pulmonary Disease

## 2022-02-15 DIAGNOSIS — H353113 Nonexudative age-related macular degeneration, right eye, advanced atrophic without subfoveal involvement: Secondary | ICD-10-CM | POA: Diagnosis not present

## 2022-02-15 DIAGNOSIS — H353221 Exudative age-related macular degeneration, left eye, with active choroidal neovascularization: Secondary | ICD-10-CM | POA: Diagnosis not present

## 2022-02-15 DIAGNOSIS — H43813 Vitreous degeneration, bilateral: Secondary | ICD-10-CM | POA: Diagnosis not present

## 2022-02-15 DIAGNOSIS — H35033 Hypertensive retinopathy, bilateral: Secondary | ICD-10-CM | POA: Diagnosis not present

## 2022-02-16 DIAGNOSIS — E1142 Type 2 diabetes mellitus with diabetic polyneuropathy: Secondary | ICD-10-CM | POA: Diagnosis not present

## 2022-02-16 DIAGNOSIS — L84 Corns and callosities: Secondary | ICD-10-CM | POA: Diagnosis not present

## 2022-02-16 DIAGNOSIS — B351 Tinea unguium: Secondary | ICD-10-CM | POA: Diagnosis not present

## 2022-02-25 ENCOUNTER — Ambulatory Visit (INDEPENDENT_AMBULATORY_CARE_PROVIDER_SITE_OTHER): Payer: Medicare Other | Admitting: Pulmonary Disease

## 2022-02-25 ENCOUNTER — Encounter: Payer: Self-pay | Admitting: Pulmonary Disease

## 2022-02-25 VITALS — BP 130/68 | HR 78 | Temp 97.9°F | Ht 61.5 in | Wt 255.8 lb

## 2022-02-25 DIAGNOSIS — G4733 Obstructive sleep apnea (adult) (pediatric): Secondary | ICD-10-CM | POA: Diagnosis not present

## 2022-02-25 DIAGNOSIS — J4489 Other specified chronic obstructive pulmonary disease: Secondary | ICD-10-CM | POA: Diagnosis not present

## 2022-02-25 DIAGNOSIS — R911 Solitary pulmonary nodule: Secondary | ICD-10-CM

## 2022-02-25 DIAGNOSIS — Z8701 Personal history of pneumonia (recurrent): Secondary | ICD-10-CM | POA: Diagnosis not present

## 2022-02-25 DIAGNOSIS — J439 Emphysema, unspecified: Secondary | ICD-10-CM | POA: Diagnosis not present

## 2022-02-25 MED ORDER — ALBUTEROL SULFATE (2.5 MG/3ML) 0.083% IN NEBU: 2.5000 mg | INHALATION_SOLUTION | Freq: Four times a day (QID) | RESPIRATORY_TRACT | 5 refills | Status: AC | PRN

## 2022-02-25 NOTE — Patient Instructions (Signed)
Will arrange for a home nebulizer machine.  Use mucinex then albuterol nebulizer then flutter valve in the morning and the evening when you have more cough with chest congestion.  Follow up in 2 months with a chest xray.

## 2022-02-25 NOTE — Progress Notes (Signed)
[facility]  Chief Complaint  Patient presents with   Follow-up    Has cough with yellow sputum [facility]       Constitutional:  BP 130/68   Pulse 78   Temp 97.9 F (36.6 C)   Ht 5' 1.5" (1.562 m)   Wt 255 lb 12.8 oz (116 kg)   SpO2 94% Comment: ra  BMI 47.55 kg/m   Past Medical History:  RA, HTN, HLD, GERD, DM, Depression  Past Surgical History:  Her  has a past surgical history that includes Total knee arthroplasty (05/07/2010); Revision total knee arthroplasty (06/09/2010); Appendectomy (1967); Abdominal hysterectomy (1992); Cholecystectomy (1992); Aorto-pulmonary window repair (1992); Hernia repair (2001); Hernia repair (2007); Abdominal wall mesh  removal (08/13/2009); Foreign body removal abdominal (01/04/2010); Colonoscopy (12/08/2011); Bone biopsy (Right, 04/19/2016); Colonoscopy with propofol (N/A, 10/16/2017); polypectomy (10/16/2017); Esophagogastroduodenoscopy (egd) with propofol (N/A, 10/29/2018); Colonoscopy with propofol (N/A, 10/29/2018); polypectomy (10/29/2018); and biopsy (10/29/2018).  Brief Summary:  [name] is a 71 y.o. female former smoker with obstructive sleep apnea and COPD.      Subjective:   She had pneumonia again in September.  Treated with levaquin.  Saw Geraldo Pitter.  CXR from 02/14/22 showed Lt base ATX, and Rt base opacity with some improvement compared to 01/06/22.  She has been doing better more recently.  Still has cough with yellow sputum.  Sometimes hard to get phlegm up.  Uses nasal irrigation and flonase.  No issues with CPAP.  Got her RSV, Flu, and COVID vaccines.  Not having fever, hemoptysis, chest pain, or wheezing.  Physical Exam:   Appearance - well kempt   ENMT - no sinus tenderness, no oral exudate, no LAN, Mallampati 3 airway, no stridor  Respiratory - equal breath sounds bilaterally, no wheezing or rales  CV - s1s2 regular rate and rhythm, no  murmurs  Ext - no clubbing, no edema  Skin - no rashes  Psych - normal mood and affect      Pulmonary testing:  PFT 09/18/07 >> FEV1 1.38 (61%), FEV1% 59, TLC 4.48 (95%), DLCO 80%, + BD PFT 08/09/17 >> FEV1 1.35 (61%), FEV1% 69, TLC 4.67 (98%), DLCO 97%  Chest Imaging:  HRCT chest 08/17/17 >> calcification RV, atherosclerosis, scarring, mild air trapping, micronodularity LLL, 1.4 cm nodule LUL, fatty liver CT chest 11/29/17 >> LUL nodule resolved HRCT chest 09/11/19 >> mild basilar subpleural scarring, 1.4 x 1.1 area of GGO LUL CT chest 09/17/20 >> mild centrilobular emphysema, LUL GGO 1 x 1.2 cm (no change)  Sleep Tests:  PSG 09/02/07 >> AHI 9 CPAP 09/19/21 to 10/18/21 >> used on 30 of 30 nights with average 7 hrs 55 min.  Average AHI 2.4 with CPAP 9 cm H2O   Cardiac Tests:  Echo 09/01/17 >> EF 65 to 70%, mild AS, severe MV calcification  Social History:  She  reports that she quit smoking about 29 years ago. Her smoking use included cigarettes. She has a 50.00 pack-year smoking history. She has never used smokeless tobacco. She reports that she does not drink alcohol and does not use drugs.  Family History:  Her family history includes Arthritis in her father; COPD in her father; Cancer in her mother; Diabetes in her father and mother; Hyperlipidemia in her brother and mother; Hypertension in her brother; Thyroid disease in her mother. She was adopted.     Assessment/Plan:   COPD with chronic bronchitis. - continue trelegy  100 one puff daily, singulair 10 mg qhs - will have her use mucinex/albuterol/flutter valve bid when she has more cough and chest congestion - will arrange for replacement home nebulizer  Recurrent pneumonia. - 3 episodes in 2023 with last in September 2023 - CXR from 02/14/22 improved, but still has some basilar opacities - will repeat CXR at her follow up in 2 months  Post nasal drip with upper airway cough. - continue nasal irrigation, flonase,  singulair - prn claritin   Obstructive sleep apnea. - she is compliant with CPAP and reports benefit - she uses [facility] for her DME - current CPAP ordered May 2021 - continue CPAP 9 cm H2O   Lt upper lobe area of ground glass opacification. - f/u CT chest without contrast in June 2024   Obesity. - discussed importance of weight loss   Rheumatoid arthritis. - followed by Dr. Amil Amen with rheumatology - on MTX, remicade  Time Spent Involved in Patient Care on Day of Examination:  36 minutes  Follow up:   Patient Instructions  Will arrange for a home nebulizer machine.  Use mucinex then albuterol nebulizer then flutter valve in the morning and the evening when you have more cough with chest congestion.  Follow up in 2 months with a chest xray.  Medication List:   Allergies as of 02/25/2022       Reactions   Demerol Nausea And Vomiting   Severe   Hydromorphone Hcl Itching   All over the body.   Tetracycline Hives        Medication List        Accurate as of February 25, 2022 11:08 AM. If you have any questions, ask your nurse or doctor.          STOP taking these medications    amoxicillin-clavulanate 875-125 MG tablet Commonly known as: AUGMENTIN Stopped by: [name], MD   fluconazole 150 MG tablet Commonly known as: DIFLUCAN Stopped by: [name], MD   levofloxacin 750 MG tablet Commonly known as: LEVAQUIN Stopped by: [name], MD   predniSONE 10 MG tablet Commonly known as: DELTASONE Stopped by: [name], MD       TAKE these medications    acetaminophen 325 MG tablet Commonly known as: TYLENOL Take 650 mg by mouth every 6 (six) hours as needed (for pain.).   albuterol 108 (90 Base) MCG/ACT inhaler Commonly known as: VENTOLIN HFA USE 2 INHALATIONS EVERY 6 HOURS AS NEEDED FOR WHEEZING OR SHORTNESS OF BREATH   albuterol (2.5 MG/3ML) 0.083% nebulizer solution Commonly known as: PROVENTIL Take 3 mLs (2.5 mg  total) by nebulization every 6 (six) hours as needed for wheezing or shortness of breath (dx: J43.9).   ALPRAZolam 0.5 MG tablet Commonly known as: XANAX Take 0.5 mg by mouth 3 (three) times daily as needed for sleep or anxiety.   Benicar 20 MG tablet Generic drug: olmesartan Take 20 mg by mouth daily.   diclofenac sodium 1 % Gel Commonly known as: VOLTAREN Apply 2-4 g topically 4 (four) times daily as needed for pain.   empagliflozin 25 MG Tabs tablet Commonly known as: JARDIANCE Take 25 mg by mouth daily.   fluticasone 50 MCG/ACT nasal spray Commonly known as: FLONASE Place 1 spray into both nostrils daily.   folic acid 1 MG tablet Commonly known as: FOLVITE Take 1 mg by mouth daily.   furosemide 40 MG tablet Commonly known as: LASIX Take 40 mg by mouth daily as needed  for fluid. Fluid   gabapentin 600 MG tablet Commonly known as: NEURONTIN Take 600 mg by mouth at bedtime.   Gerhardt's butt cream Crea Apply 1 application topically 2 (two) times daily.   HumaLOG KwikPen 100 UNIT/ML KwikPen Generic drug: insulin lispro Inject 8-14 Units into the skin See admin instructions. Inject 8 units subcutaneously in the morning & inject 14 units subcutaneously at supper   IRON PO Take by mouth.   Lantus SoloStar 100 UNIT/ML Solostar Pen Generic drug: insulin glargine Inject into the skin 2 (two) times a day. Inject 50 units under the skin in the morning and 60 units in the evening.   loratadine 10 MG tablet Commonly known as: CLARITIN Take 10 mg by mouth daily.   Lubricant Eye Drops 0.5 % Soln Generic drug: carboxymethylcellulose 1 drop 3 (three) times daily as needed.   metFORMIN 500 MG 24 hr tablet Commonly known as: GLUCOPHAGE-XR Take 2,000 mg by mouth daily.   methotrexate 2.5 MG tablet Commonly known as: RHEUMATREX Take 2.5 mg by mouth once a week. Caution:Chemotherapy. Protect from light.   montelukast 10 MG tablet Commonly known as: SINGULAIR TAKE 1  TABLET AT BEDTIME   omeprazole 40 MG capsule Commonly known as: PRILOSEC TAKE 1 CAPSULE EVERY MORNING   Pfizer COVID-19 Vac Bivalent injection Generic drug: COVID-19 mRNA bivalent vaccine (Pfizer) Inject into the muscle.   REMICADE IV Inject 1 Dose into the vein every 6 (six) weeks.   sertraline 50 MG tablet Commonly known as: ZOLOFT Take 50 mg by mouth daily.   simvastatin 40 MG tablet Commonly known as: ZOCOR Take 40 mg by mouth daily with supper.   Trelegy Ellipta 100-62.5-25 MCG/ACT Aepb Generic drug: Fluticasone-Umeclidin-Vilant USE 1 INHALATION DAILY   Trulicity 3.97 QB/3.4LP Sopn Generic drug: Dulaglutide SMARTSIG:0.5 Milliliter(s) SUB-Q Once a Week        Signature:  [name], MD [place] Pager - [phone] 02/25/2022, 11:08 AM

## 2022-02-28 DIAGNOSIS — M0589 Other rheumatoid arthritis with rheumatoid factor of multiple sites: Secondary | ICD-10-CM | POA: Diagnosis not present

## 2022-02-28 DIAGNOSIS — Z79899 Other long term (current) drug therapy: Secondary | ICD-10-CM | POA: Diagnosis not present

## 2022-03-07 ENCOUNTER — Telehealth: Payer: Self-pay | Admitting: Pulmonary Disease

## 2022-03-07 DIAGNOSIS — E1129 Type 2 diabetes mellitus with other diabetic kidney complication: Secondary | ICD-10-CM | POA: Diagnosis not present

## 2022-03-07 NOTE — Telephone Encounter (Signed)
Order re-faxed to [facility] on [address] in [place]

## 2022-03-09 DIAGNOSIS — M545 Low back pain, unspecified: Secondary | ICD-10-CM | POA: Diagnosis not present

## 2022-03-09 DIAGNOSIS — M0589 Other rheumatoid arthritis with rheumatoid factor of multiple sites: Secondary | ICD-10-CM | POA: Diagnosis not present

## 2022-03-09 DIAGNOSIS — L299 Pruritus, unspecified: Secondary | ICD-10-CM | POA: Diagnosis not present

## 2022-03-09 DIAGNOSIS — Z79899 Other long term (current) drug therapy: Secondary | ICD-10-CM | POA: Diagnosis not present

## 2022-03-09 DIAGNOSIS — Z6841 Body Mass Index (BMI) 40.0 and over, adult: Secondary | ICD-10-CM | POA: Diagnosis not present

## 2022-03-09 DIAGNOSIS — M1991 Primary osteoarthritis, unspecified site: Secondary | ICD-10-CM | POA: Diagnosis not present

## 2022-03-16 DIAGNOSIS — J440 Chronic obstructive pulmonary disease with acute lower respiratory infection: Secondary | ICD-10-CM | POA: Diagnosis not present

## 2022-03-16 DIAGNOSIS — R7309 Other abnormal glucose: Secondary | ICD-10-CM | POA: Diagnosis not present

## 2022-03-16 DIAGNOSIS — R051 Acute cough: Secondary | ICD-10-CM | POA: Diagnosis not present

## 2022-03-16 DIAGNOSIS — E1122 Type 2 diabetes mellitus with diabetic chronic kidney disease: Secondary | ICD-10-CM | POA: Diagnosis not present

## 2022-03-23 DIAGNOSIS — Z8631 Personal history of diabetic foot ulcer: Secondary | ICD-10-CM | POA: Diagnosis not present

## 2022-03-23 DIAGNOSIS — E1142 Type 2 diabetes mellitus with diabetic polyneuropathy: Secondary | ICD-10-CM | POA: Diagnosis not present

## 2022-04-12 ENCOUNTER — Encounter: Payer: Self-pay | Admitting: Pulmonary Disease

## 2022-04-12 DIAGNOSIS — H43393 Other vitreous opacities, bilateral: Secondary | ICD-10-CM | POA: Diagnosis not present

## 2022-04-12 DIAGNOSIS — J439 Emphysema, unspecified: Secondary | ICD-10-CM

## 2022-04-13 DIAGNOSIS — M0589 Other rheumatoid arthritis with rheumatoid factor of multiple sites: Secondary | ICD-10-CM | POA: Diagnosis not present

## 2022-04-13 DIAGNOSIS — Z79899 Other long term (current) drug therapy: Secondary | ICD-10-CM | POA: Diagnosis not present

## 2022-04-14 ENCOUNTER — Ambulatory Visit (HOSPITAL_COMMUNITY)
Admission: RE | Admit: 2022-04-14 | Discharge: 2022-04-14 | Disposition: A | Discharge status: Home / Self Care | Payer: Medicare Other | Source: Ambulatory Visit | Attending: Pulmonary Disease | Admitting: Pulmonary Disease

## 2022-04-14 DIAGNOSIS — J439 Emphysema, unspecified: Secondary | ICD-10-CM | POA: Diagnosis not present

## 2022-04-14 DIAGNOSIS — J449 Chronic obstructive pulmonary disease, unspecified: Secondary | ICD-10-CM | POA: Diagnosis not present

## 2022-04-14 DIAGNOSIS — J9811 Atelectasis: Secondary | ICD-10-CM | POA: Diagnosis not present

## 2022-04-18 ENCOUNTER — Ambulatory Visit (INDEPENDENT_AMBULATORY_CARE_PROVIDER_SITE_OTHER): Payer: Medicare Other | Admitting: Pulmonary Disease

## 2022-04-18 ENCOUNTER — Encounter: Payer: Self-pay | Admitting: Pulmonary Disease

## 2022-04-18 VITALS — BP 134/82 | HR 104 | Temp 98.2°F | Ht 61.5 in | Wt 249.6 lb

## 2022-04-18 DIAGNOSIS — R911 Solitary pulmonary nodule: Secondary | ICD-10-CM

## 2022-04-18 DIAGNOSIS — R0989 Other specified symptoms and signs involving the circulatory and respiratory systems: Secondary | ICD-10-CM | POA: Diagnosis not present

## 2022-04-18 DIAGNOSIS — R9389 Abnormal findings on diagnostic imaging of other specified body structures: Secondary | ICD-10-CM | POA: Diagnosis not present

## 2022-04-18 DIAGNOSIS — J449 Chronic obstructive pulmonary disease, unspecified: Secondary | ICD-10-CM | POA: Diagnosis not present

## 2022-04-18 MED ORDER — PREDNISONE 10 MG PO TABS: ORAL_TABLET | ORAL | 0 refills | Status: AC

## 2022-04-18 NOTE — Progress Notes (Signed)
[facility]  Chief Complaint  Patient presents with   Follow-up    Using CPAP no [place] avail [place] [facility] [place] Patient still has thick yellow mucus coughing up- Using mucinex and flutter valve but states it isnt helping  She states she has noticed her O2 sats dropping into the 80's at home with activity lately.      Constitutional:  BP 134/82   Pulse (!) 104   Temp 98.2 F (36.8 C)   Ht 5' 1.5" (1.562 m)   Wt 249 lb 9.6 oz (113.2 kg)   SpO2 95% Comment: ra  BMI 46.40 kg/m   Past Medical History:  RA, HTN, HLD, GERD, DM, Depression  Past Surgical History:  Her  has a past surgical history that includes Total knee arthroplasty (05/07/2010); Revision total knee arthroplasty (06/09/2010); Appendectomy (1967); Abdominal hysterectomy (1992); Cholecystectomy (1992); Aorto-pulmonary window repair (1992); Hernia repair (2001); Hernia repair (2007); Abdominal wall mesh  removal (08/13/2009); Foreign body removal abdominal (01/04/2010); Colonoscopy (12/08/2011); Bone biopsy (Right, 04/19/2016); Colonoscopy with propofol (N/A, 10/16/2017); polypectomy (10/16/2017); Esophagogastroduodenoscopy (egd) with propofol (N/A, 10/29/2018); Colonoscopy with propofol (N/A, 10/29/2018); polypectomy (10/29/2018); and biopsy (10/29/2018).  Brief Summary:  [name] is a 72 y.o. female former smoker with obstructive sleep apnea and COPD.      Subjective:   Chest xray from last week showed improvement in previous pneumonia, but she had area of atelectasis.  Also showed hyperinflation with flattened diaphragms.  She has cough with yellow sputum.  Gets intermittent episodes of wheezing.  Not having chest pain, fever, or hemoptysis.  She is getting more winded with house work.  Her SpO2 was in the mid 80's once at home.  She walked 2 laps in office today and SpO2 stayed above 93%.  She has been using mucinex and flutter valve, but continues to have trouble  with cough, chest congestion, and sputum production.  This has been going on for at least 6 months.  Physical Exam:   Appearance - well kempt   ENMT - no sinus tenderness, no oral exudate, no LAN, Mallampati 3 airway, no stridor  Respiratory - b/l expiratory wheeze and rhonchi that partially clears with coughin  CV - s1s2 regular rate and rhythm, no murmurs  Ext - no clubbing, no edema  Skin - no rashes  Psych - normal mood and affect      Pulmonary testing:  PFT 09/18/07 >> FEV1 1.38 (61%), FEV1% 59, TLC 4.48 (95%), DLCO 80%, + BD PFT 08/09/17 >> FEV1 1.35 (61%), FEV1% 69, TLC 4.67 (98%), DLCO 97%  Chest Imaging:  HRCT chest 08/17/17 >> calcification RV, atherosclerosis, scarring, mild air trapping, micronodularity LLL, 1.4 cm nodule LUL, fatty liver CT chest 11/29/17 >> LUL nodule resolved HRCT chest 09/11/19 >> mild basilar subpleural scarring, 1.4 x 1.1 area of GGO LUL CT chest 09/17/20 >> mild centrilobular emphysema, LUL GGO 1 x 1.2 cm (no change)  Sleep Tests:  PSG 09/02/07 >> AHI 9 CPAP 09/19/21 to 10/18/21 >> used on 30 of 30 nights with average 7 hrs 55 min.  Average AHI 2.4 with CPAP 9 cm H2O   Cardiac Tests:  Echo 09/01/17 >> EF 65 to 70%, mild AS, severe MV calcification  Social History:  She  reports that she quit smoking about 30 years ago. Her smoking use included cigarettes. She has a 50.00 pack-year smoking history. She has never used smokeless tobacco. She reports that she does not drink  alcohol and does not use drugs.  Family History:  Her family history includes Arthritis in her father; COPD in her father; Cancer in her mother; Diabetes in her father and mother; Hyperlipidemia in her brother and mother; Hypertension in her brother; Thyroid disease in her mother. She was adopted.     Assessment/Plan:   COPD with chronic bronchitis. - she has an exacerbation - will give her course of prednisone; don't think she needs additional antibiotics at this time -  continue trelegy 100 one puff daily, singulair 10 mg nightly - continue mucinex/albuterol/flutter valve bid - she has a home nebulizer - will arrange for chest percussion system  Post nasal drip with upper airway cough. - continue nasal irrigation, flonase, singulair - prn claritin   Obstructive sleep apnea. 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 - f/u CT chest without contrast in June 2024   Obesity. - discussed importance of weight loss   Rheumatoid arthritis. - followed by Dr. Amil Amen with rheumatology - on MTX, remicade  Time Spent Involved in Patient Care on Day of Examination:  38 minutes  Follow up:   Patient Instructions  Prednisone 10 mg pill >> 3 pills daily for 2 days, 2 pills daily for 2 days, 1 pill daily for 2 days  Will arrange for chest percussion vest  Will arrange for CT chest in June 2024 and follow up after this  Medication List:   Allergies as of 04/18/2022       Reactions   Demerol Nausea And Vomiting   Severe   Hydromorphone Hcl Itching   All over the body.   Tetracycline Hives        Medication List        Accurate as of April 18, 2022 11:38 AM. If you have any questions, ask your nurse or doctor.          acetaminophen 325 MG tablet Commonly known as: TYLENOL Take 650 mg by mouth every 6 (six) hours as needed (for pain.).   albuterol 108 (90 Base) MCG/ACT inhaler Commonly known as: VENTOLIN HFA USE 2 INHALATIONS EVERY 6 HOURS AS NEEDED FOR WHEEZING OR SHORTNESS OF BREATH   albuterol (2.5 MG/3ML) 0.083% nebulizer solution Commonly known as: PROVENTIL Take 3 mLs (2.5 mg total) by nebulization every 6 (six) hours as needed for wheezing or shortness of breath (dx: J43.9).   ALPRAZolam 0.5 MG tablet Commonly known as: XANAX Take 0.5 mg by mouth 3 (three) times  daily as needed for sleep or anxiety.   Benicar 20 MG tablet Generic drug: olmesartan Take 20 mg by mouth daily.   diclofenac sodium 1 % Gel Commonly known as: VOLTAREN Apply 2-4 g topically 4 (four) times daily as needed for pain.   empagliflozin 25 MG Tabs tablet Commonly known as: JARDIANCE Take 25 mg by mouth daily.   fluticasone 50 MCG/ACT nasal spray Commonly known as: FLONASE Place 1 spray into both nostrils daily.   folic acid 1 MG tablet Commonly known as: FOLVITE Take 1 mg by mouth daily.   furosemide 40 MG tablet Commonly known as: LASIX Take 40 mg by mouth daily as needed for fluid. Fluid   gabapentin 600 MG tablet Commonly known as: NEURONTIN Take 600 mg by mouth at bedtime.   Gerhardt's butt cream Crea Apply 1  application topically 2 (two) times daily.   HumaLOG KwikPen 100 UNIT/ML KwikPen Generic drug: insulin lispro Inject 8-14 Units into the skin See admin instructions. Inject 8 units subcutaneously in the morning & inject 14 units subcutaneously at supper   IRON PO Take by mouth.   Lantus SoloStar 100 UNIT/ML Solostar Pen Generic drug: insulin glargine Inject into the skin 2 (two) times a day. Inject 50 units under the skin in the morning and 60 units in the evening.   loratadine 10 MG tablet Commonly known as: CLARITIN Take 10 mg by mouth daily.   Lubricant Eye Drops 0.5 % Soln Generic drug: carboxymethylcellulose 1 drop 3 (three) times daily as needed.   metFORMIN 500 MG 24 hr tablet Commonly known as: GLUCOPHAGE-XR Take 2,000 mg by mouth daily.   methotrexate 2.5 MG tablet Commonly known as: RHEUMATREX Take 2.5 mg by mouth once a week. Caution:Chemotherapy. Protect from light.   montelukast 10 MG tablet Commonly known as: SINGULAIR TAKE 1 TABLET AT BEDTIME   omeprazole 40 MG capsule Commonly known as: PRILOSEC TAKE 1 CAPSULE EVERY MORNING   Pfizer COVID-19 Vac Bivalent injection Generic drug: COVID-19 mRNA bivalent vaccine  (Pfizer) Inject into the muscle.   predniSONE 10 MG tablet Commonly known as: DELTASONE Take 3 tablets (30 mg total) by mouth daily with breakfast for 2 days, THEN 2 tablets (20 mg total) daily with breakfast for 2 days, THEN 1 tablet (10 mg total) daily with breakfast for 2 days. Start taking on: April 18, 2022 Started by: [name], MD   REMICADE IV Inject 1 Dose into the vein every 6 (six) weeks.   sertraline 50 MG tablet Commonly known as: ZOLOFT Take 50 mg by mouth daily.   simvastatin 40 MG tablet Commonly known as: ZOCOR Take 40 mg by mouth daily with supper.   Trelegy Ellipta 100-62.5-25 MCG/ACT Aepb Generic drug: Fluticasone-Umeclidin-Vilant USE 1 INHALATION DAILY   Trulicity 8.18 EX/9.3ZJ Sopn Generic drug: Dulaglutide SMARTSIG:0.5 Milliliter(s) SUB-Q Once a Week        Signature:  [name], MD [place] Pager - [phone] 04/18/2022, 11:38 AM

## 2022-04-18 NOTE — Patient Instructions (Signed)
Prednisone 10 mg pill >> 3 pills daily for 2 days, 2 pills daily for 2 days, 1 pill daily for 2 days  Will arrange for chest percussion vest  Will arrange for CT chest in June 2024 and follow up after this

## 2022-04-19 DIAGNOSIS — H353220 Exudative age-related macular degeneration, left eye, stage unspecified: Secondary | ICD-10-CM | POA: Diagnosis not present

## 2022-04-19 DIAGNOSIS — H353211 Exudative age-related macular degeneration, right eye, with active choroidal neovascularization: Secondary | ICD-10-CM | POA: Diagnosis not present

## 2022-04-21 DIAGNOSIS — M25512 Pain in left shoulder: Secondary | ICD-10-CM | POA: Diagnosis not present

## 2022-05-04 DIAGNOSIS — E1142 Type 2 diabetes mellitus with diabetic polyneuropathy: Secondary | ICD-10-CM | POA: Diagnosis not present

## 2022-05-04 DIAGNOSIS — L84 Corns and callosities: Secondary | ICD-10-CM | POA: Diagnosis not present

## 2022-05-04 DIAGNOSIS — B351 Tinea unguium: Secondary | ICD-10-CM | POA: Diagnosis not present

## 2022-05-24 DIAGNOSIS — H35033 Hypertensive retinopathy, bilateral: Secondary | ICD-10-CM | POA: Diagnosis not present

## 2022-05-24 DIAGNOSIS — E119 Type 2 diabetes mellitus without complications: Secondary | ICD-10-CM | POA: Diagnosis not present

## 2022-05-24 DIAGNOSIS — H353231 Exudative age-related macular degeneration, bilateral, with active choroidal neovascularization: Secondary | ICD-10-CM | POA: Diagnosis not present

## 2022-05-24 DIAGNOSIS — H43813 Vitreous degeneration, bilateral: Secondary | ICD-10-CM | POA: Diagnosis not present

## 2022-05-25 DIAGNOSIS — M0589 Other rheumatoid arthritis with rheumatoid factor of multiple sites: Secondary | ICD-10-CM | POA: Diagnosis not present

## 2022-06-08 DIAGNOSIS — E1142 Type 2 diabetes mellitus with diabetic polyneuropathy: Secondary | ICD-10-CM | POA: Diagnosis not present

## 2022-06-08 DIAGNOSIS — Z8631 Personal history of diabetic foot ulcer: Secondary | ICD-10-CM | POA: Diagnosis not present

## 2022-06-15 DIAGNOSIS — U071 COVID-19: Secondary | ICD-10-CM | POA: Diagnosis not present

## 2022-06-15 DIAGNOSIS — I10 Essential (primary) hypertension: Secondary | ICD-10-CM | POA: Diagnosis not present

## 2022-06-15 DIAGNOSIS — N390 Urinary tract infection, site not specified: Secondary | ICD-10-CM | POA: Diagnosis not present

## 2022-06-15 DIAGNOSIS — E1122 Type 2 diabetes mellitus with diabetic chronic kidney disease: Secondary | ICD-10-CM | POA: Diagnosis not present

## 2022-07-06 DIAGNOSIS — Z79899 Other long term (current) drug therapy: Secondary | ICD-10-CM | POA: Diagnosis not present

## 2022-07-06 DIAGNOSIS — M0589 Other rheumatoid arthritis with rheumatoid factor of multiple sites: Secondary | ICD-10-CM | POA: Diagnosis not present

## 2022-07-06 DIAGNOSIS — R5383 Other fatigue: Secondary | ICD-10-CM | POA: Diagnosis not present

## 2022-07-13 DIAGNOSIS — E1142 Type 2 diabetes mellitus with diabetic polyneuropathy: Secondary | ICD-10-CM | POA: Diagnosis not present

## 2022-07-13 DIAGNOSIS — B351 Tinea unguium: Secondary | ICD-10-CM | POA: Diagnosis not present

## 2022-07-13 DIAGNOSIS — L84 Corns and callosities: Secondary | ICD-10-CM | POA: Diagnosis not present

## 2022-07-19 DIAGNOSIS — H353231 Exudative age-related macular degeneration, bilateral, with active choroidal neovascularization: Secondary | ICD-10-CM | POA: Diagnosis not present

## 2022-08-17 DIAGNOSIS — M0589 Other rheumatoid arthritis with rheumatoid factor of multiple sites: Secondary | ICD-10-CM | POA: Diagnosis not present

## 2022-08-17 DIAGNOSIS — Z79899 Other long term (current) drug therapy: Secondary | ICD-10-CM | POA: Diagnosis not present

## 2022-08-18 DIAGNOSIS — B351 Tinea unguium: Secondary | ICD-10-CM | POA: Diagnosis not present

## 2022-08-18 DIAGNOSIS — L84 Corns and callosities: Secondary | ICD-10-CM | POA: Diagnosis not present

## 2022-08-18 DIAGNOSIS — E1142 Type 2 diabetes mellitus with diabetic polyneuropathy: Secondary | ICD-10-CM | POA: Diagnosis not present

## 2022-08-23 DIAGNOSIS — N3000 Acute cystitis without hematuria: Secondary | ICD-10-CM | POA: Diagnosis not present

## 2022-08-30 DIAGNOSIS — H43813 Vitreous degeneration, bilateral: Secondary | ICD-10-CM | POA: Diagnosis not present

## 2022-08-30 DIAGNOSIS — H35033 Hypertensive retinopathy, bilateral: Secondary | ICD-10-CM | POA: Diagnosis not present

## 2022-08-30 DIAGNOSIS — E119 Type 2 diabetes mellitus without complications: Secondary | ICD-10-CM | POA: Diagnosis not present

## 2022-08-30 DIAGNOSIS — H353231 Exudative age-related macular degeneration, bilateral, with active choroidal neovascularization: Secondary | ICD-10-CM | POA: Diagnosis not present

## 2022-09-05 ENCOUNTER — Other Ambulatory Visit: Payer: Self-pay | Admitting: Pulmonary Disease

## 2022-09-06 DIAGNOSIS — M5441 Lumbago with sciatica, right side: Secondary | ICD-10-CM | POA: Diagnosis not present

## 2022-09-06 DIAGNOSIS — M545 Low back pain, unspecified: Secondary | ICD-10-CM | POA: Diagnosis not present

## 2022-09-07 DIAGNOSIS — Z79899 Other long term (current) drug therapy: Secondary | ICD-10-CM | POA: Diagnosis not present

## 2022-09-07 DIAGNOSIS — M1991 Primary osteoarthritis, unspecified site: Secondary | ICD-10-CM | POA: Diagnosis not present

## 2022-09-07 DIAGNOSIS — M545 Low back pain, unspecified: Secondary | ICD-10-CM | POA: Diagnosis not present

## 2022-09-07 DIAGNOSIS — M0589 Other rheumatoid arthritis with rheumatoid factor of multiple sites: Secondary | ICD-10-CM | POA: Diagnosis not present

## 2022-09-07 DIAGNOSIS — L299 Pruritus, unspecified: Secondary | ICD-10-CM | POA: Diagnosis not present

## 2022-09-07 DIAGNOSIS — Z6841 Body Mass Index (BMI) 40.0 and over, adult: Secondary | ICD-10-CM | POA: Diagnosis not present

## 2022-09-14 ENCOUNTER — Other Ambulatory Visit: Payer: Self-pay

## 2022-09-14 ENCOUNTER — Encounter (HOSPITAL_BASED_OUTPATIENT_CLINIC_OR_DEPARTMENT_OTHER): Payer: Self-pay | Admitting: Emergency Medicine

## 2022-09-14 ENCOUNTER — Inpatient Hospital Stay (HOSPITAL_BASED_OUTPATIENT_CLINIC_OR_DEPARTMENT_OTHER)
Admission: EM | Admit: 2022-09-14 | Discharge: 2022-09-19 | DRG: 871 | Disposition: A | Discharge status: Home / Self Care | Payer: Medicare Other | Attending: Internal Medicine | Admitting: Internal Medicine

## 2022-09-14 ENCOUNTER — Emergency Department (HOSPITAL_BASED_OUTPATIENT_CLINIC_OR_DEPARTMENT_OTHER): Payer: Medicare Other

## 2022-09-14 DIAGNOSIS — J9601 Acute respiratory failure with hypoxia: Secondary | ICD-10-CM | POA: Diagnosis present

## 2022-09-14 DIAGNOSIS — Z7984 Long term (current) use of oral hypoglycemic drugs: Secondary | ICD-10-CM | POA: Diagnosis not present

## 2022-09-14 DIAGNOSIS — J440 Chronic obstructive pulmonary disease with acute lower respiratory infection: Secondary | ICD-10-CM | POA: Diagnosis present

## 2022-09-14 DIAGNOSIS — R6521 Severe sepsis with septic shock: Secondary | ICD-10-CM | POA: Diagnosis present

## 2022-09-14 DIAGNOSIS — R918 Other nonspecific abnormal finding of lung field: Secondary | ICD-10-CM | POA: Diagnosis not present

## 2022-09-14 DIAGNOSIS — K219 Gastro-esophageal reflux disease without esophagitis: Secondary | ICD-10-CM | POA: Diagnosis present

## 2022-09-14 DIAGNOSIS — D849 Immunodeficiency, unspecified: Secondary | ICD-10-CM | POA: Diagnosis present

## 2022-09-14 DIAGNOSIS — R6520 Severe sepsis without septic shock: Secondary | ICD-10-CM | POA: Diagnosis present

## 2022-09-14 DIAGNOSIS — Z881 Allergy status to other antibiotic agents status: Secondary | ICD-10-CM

## 2022-09-14 DIAGNOSIS — J9811 Atelectasis: Secondary | ICD-10-CM | POA: Diagnosis not present

## 2022-09-14 DIAGNOSIS — E785 Hyperlipidemia, unspecified: Secondary | ICD-10-CM | POA: Diagnosis present

## 2022-09-14 DIAGNOSIS — Z825 Family history of asthma and other chronic lower respiratory diseases: Secondary | ICD-10-CM

## 2022-09-14 DIAGNOSIS — E1110 Type 2 diabetes mellitus with ketoacidosis without coma: Secondary | ICD-10-CM

## 2022-09-14 DIAGNOSIS — Z6841 Body Mass Index (BMI) 40.0 and over, adult: Secondary | ICD-10-CM | POA: Diagnosis not present

## 2022-09-14 DIAGNOSIS — R0602 Shortness of breath: Secondary | ICD-10-CM | POA: Diagnosis present

## 2022-09-14 DIAGNOSIS — K5730 Diverticulosis of large intestine without perforation or abscess without bleeding: Secondary | ICD-10-CM | POA: Diagnosis not present

## 2022-09-14 DIAGNOSIS — Z8249 Family history of ischemic heart disease and other diseases of the circulatory system: Secondary | ICD-10-CM

## 2022-09-14 DIAGNOSIS — J449 Chronic obstructive pulmonary disease, unspecified: Secondary | ICD-10-CM | POA: Diagnosis not present

## 2022-09-14 DIAGNOSIS — E119 Type 2 diabetes mellitus without complications: Secondary | ICD-10-CM

## 2022-09-14 DIAGNOSIS — Z1152 Encounter for screening for COVID-19: Secondary | ICD-10-CM | POA: Diagnosis not present

## 2022-09-14 DIAGNOSIS — E1129 Type 2 diabetes mellitus with other diabetic kidney complication: Secondary | ICD-10-CM | POA: Diagnosis not present

## 2022-09-14 DIAGNOSIS — J441 Chronic obstructive pulmonary disease with (acute) exacerbation: Secondary | ICD-10-CM | POA: Diagnosis present

## 2022-09-14 DIAGNOSIS — N200 Calculus of kidney: Secondary | ICD-10-CM | POA: Diagnosis not present

## 2022-09-14 DIAGNOSIS — M069 Rheumatoid arthritis, unspecified: Secondary | ICD-10-CM | POA: Diagnosis present

## 2022-09-14 DIAGNOSIS — Z833 Family history of diabetes mellitus: Secondary | ICD-10-CM

## 2022-09-14 DIAGNOSIS — Z9049 Acquired absence of other specified parts of digestive tract: Secondary | ICD-10-CM | POA: Diagnosis not present

## 2022-09-14 DIAGNOSIS — G4733 Obstructive sleep apnea (adult) (pediatric): Secondary | ICD-10-CM | POA: Diagnosis not present

## 2022-09-14 DIAGNOSIS — Z7951 Long term (current) use of inhaled steroids: Secondary | ICD-10-CM | POA: Diagnosis not present

## 2022-09-14 DIAGNOSIS — Z885 Allergy status to narcotic agent status: Secondary | ICD-10-CM

## 2022-09-14 DIAGNOSIS — J189 Pneumonia, unspecified organism: Secondary | ICD-10-CM | POA: Diagnosis present

## 2022-09-14 DIAGNOSIS — Z79899 Other long term (current) drug therapy: Secondary | ICD-10-CM | POA: Diagnosis not present

## 2022-09-14 DIAGNOSIS — Z794 Long term (current) use of insulin: Secondary | ICD-10-CM | POA: Diagnosis not present

## 2022-09-14 DIAGNOSIS — A403 Sepsis due to Streptococcus pneumoniae: Secondary | ICD-10-CM | POA: Diagnosis present

## 2022-09-14 DIAGNOSIS — A419 Sepsis, unspecified organism: Secondary | ICD-10-CM | POA: Diagnosis present

## 2022-09-14 DIAGNOSIS — Z79631 Long term (current) use of antimetabolite agent: Secondary | ICD-10-CM | POA: Diagnosis not present

## 2022-09-14 DIAGNOSIS — Z96651 Presence of right artificial knee joint: Secondary | ICD-10-CM | POA: Diagnosis present

## 2022-09-14 DIAGNOSIS — J439 Emphysema, unspecified: Secondary | ICD-10-CM | POA: Diagnosis not present

## 2022-09-14 DIAGNOSIS — Z8261 Family history of arthritis: Secondary | ICD-10-CM

## 2022-09-14 DIAGNOSIS — Z87891 Personal history of nicotine dependence: Secondary | ICD-10-CM

## 2022-09-14 DIAGNOSIS — Z83438 Family history of other disorder of lipoprotein metabolism and other lipidemia: Secondary | ICD-10-CM

## 2022-09-14 DIAGNOSIS — I10 Essential (primary) hypertension: Secondary | ICD-10-CM | POA: Diagnosis not present

## 2022-09-14 DIAGNOSIS — Z8349 Family history of other endocrine, nutritional and metabolic diseases: Secondary | ICD-10-CM

## 2022-09-14 LAB — CBC WITH DIFFERENTIAL/PLATELET
Abs Immature Granulocytes: 0.05 10*3/uL (ref 0.00–0.07)
Basophils Absolute: 0 10*3/uL (ref 0.0–0.1)
Basophils Relative: 0 %
Eosinophils Absolute: 0.1 10*3/uL (ref 0.0–0.5)
Eosinophils Relative: 1 %
HCT: 49.2 % — ABNORMAL HIGH (ref 36.0–46.0)
Hemoglobin: 16.1 g/dL — ABNORMAL HIGH (ref 12.0–15.0)
Immature Granulocytes: 0 %
Lymphocytes Relative: 4 %
Lymphs Abs: 0.5 10*3/uL — ABNORMAL LOW (ref 0.7–4.0)
MCH: 29.7 pg (ref 26.0–34.0)
MCHC: 32.7 g/dL (ref 30.0–36.0)
MCV: 90.6 fL (ref 80.0–100.0)
Monocytes Absolute: 0.6 10*3/uL (ref 0.1–1.0)
Monocytes Relative: 5 %
Neutro Abs: 11.4 10*3/uL — ABNORMAL HIGH (ref 1.7–7.7)
Neutrophils Relative %: 90 %
Platelets: 142 10*3/uL — ABNORMAL LOW (ref 150–400)
RBC: 5.43 MIL/uL — ABNORMAL HIGH (ref 3.87–5.11)
RDW: 14.7 % (ref 11.5–15.5)
WBC: 12.7 10*3/uL — ABNORMAL HIGH (ref 4.0–10.5)
nRBC: 0 % (ref 0.0–0.2)

## 2022-09-14 LAB — COMPREHENSIVE METABOLIC PANEL
ALT: 24 U/L (ref 0–44)
AST: 23 U/L (ref 15–41)
Albumin: 4.1 g/dL (ref 3.5–5.0)
Alkaline Phosphatase: 68 U/L (ref 38–126)
Anion gap: 10 (ref 5–15)
BUN: 19 mg/dL (ref 8–23)
CO2: 27 mmol/L (ref 22–32)
Calcium: 9.8 mg/dL (ref 8.9–10.3)
Chloride: 101 mmol/L (ref 98–111)
Creatinine, Ser: 0.88 mg/dL (ref 0.44–1.00)
GFR, Estimated: >60 mL/min (ref >60)
Glucose, Bld: 102 mg/dL — ABNORMAL HIGH (ref 70–99)
Potassium: 4.9 mmol/L (ref 3.5–5.1)
Sodium: 138 mmol/L (ref 135–145)
Total Bilirubin: 0.8 mg/dL (ref 0.3–1.2)
Total Protein: 6.9 g/dL (ref 6.5–8.1)

## 2022-09-14 LAB — URINALYSIS, W/ REFLEX TO CULTURE (INFECTION SUSPECTED)
Bacteria, UA: NONE SEEN
Bilirubin Urine: NEGATIVE
Glucose, UA: 500 mg/dL — AB
Ketones, ur: NEGATIVE mg/dL
Nitrite: NEGATIVE
Protein, ur: NEGATIVE mg/dL
Specific Gravity, Urine: 1.012 (ref 1.005–1.030)
WBC, UA: >50 WBC/hpf (ref 0–5)
pH: 5 (ref 5.0–8.0)

## 2022-09-14 LAB — RESP PANEL BY RT-PCR (RSV, FLU A&B, COVID)  RVPGX2
Influenza A by PCR: NEGATIVE
Influenza B by PCR: NEGATIVE
Resp Syncytial Virus by PCR: NEGATIVE
SARS Coronavirus 2 by RT PCR: NEGATIVE

## 2022-09-14 LAB — PROTIME-INR
INR: 1.1 (ref 0.8–1.2)
Prothrombin Time: 14.4 seconds (ref 11.4–15.2)

## 2022-09-14 LAB — LACTIC ACID, PLASMA
Lactic Acid, Venous: 2.8 mmol/L (ref 0.5–1.9)
Lactic Acid, Venous: 3.3 mmol/L (ref 0.5–1.9)

## 2022-09-14 MED ORDER — LACTATED RINGERS IV SOLN
INTRAVENOUS | Status: DC
  Administered 2022-09-14: 1000 mL via INTRAVENOUS

## 2022-09-14 MED ORDER — IOHEXOL 300 MG/ML  SOLN
100.0000 mL | Freq: Once | INTRAMUSCULAR | Status: AC | PRN
  Administered 2022-09-14: 100 mL via INTRAVENOUS

## 2022-09-14 MED ORDER — VANCOMYCIN HCL IN DEXTROSE 1-5 GM/200ML-% IV SOLN
1000.0000 mg | INTRAVENOUS | Status: DC
  Filled 2022-09-14: qty 200

## 2022-09-14 MED ORDER — LACTATED RINGERS IV BOLUS
30.0000 mL/kg | Freq: Once | INTRAVENOUS | Status: AC
  Administered 2022-09-14: 2280 mL via INTRAVENOUS

## 2022-09-14 MED ORDER — SODIUM CHLORIDE 0.9 % IV SOLN
2.0000 g | Freq: Once | INTRAVENOUS | Status: AC
  Administered 2022-09-14: 2 g via INTRAVENOUS
  Filled 2022-09-14: qty 12.5

## 2022-09-14 MED ORDER — VANCOMYCIN HCL IN DEXTROSE 1-5 GM/200ML-% IV SOLN
1000.0000 mg | Freq: Once | INTRAVENOUS | Status: AC
  Administered 2022-09-14: 1000 mg via INTRAVENOUS
  Filled 2022-09-14: qty 200

## 2022-09-14 MED ORDER — METRONIDAZOLE 500 MG/100ML IV SOLN
500.0000 mg | Freq: Once | INTRAVENOUS | Status: AC
  Administered 2022-09-14: 500 mg via INTRAVENOUS
  Filled 2022-09-14: qty 100

## 2022-09-14 MED ORDER — VANCOMYCIN HCL IN DEXTROSE 1-5 GM/200ML-% IV SOLN: 1000.0000 mg | Freq: Once | INTRAVENOUS | Status: DC

## 2022-09-14 MED ORDER — SODIUM CHLORIDE 0.9 % IV SOLN: 2.0000 g | Freq: Three times a day (TID) | INTRAVENOUS | Status: DC

## 2022-09-14 MED ORDER — SODIUM CHLORIDE 0.9 % IV BOLUS
1000.0000 mL | Freq: Once | INTRAVENOUS | Status: AC
  Administered 2022-09-14: 1000 mL via INTRAVENOUS

## 2022-09-14 MED ORDER — VANCOMYCIN HCL IN DEXTROSE 1-5 GM/200ML-% IV SOLN
1000.0000 mg | Freq: Once | INTRAVENOUS | Status: AC
  Administered 2022-09-14: 1000 mg via INTRAVENOUS

## 2022-09-14 NOTE — ED Provider Notes (Signed)
[place] EMERGENCY DEPARTMENT AT [facility] Provider Note  CSN: [id number] Arrival date & time: 09/14/22 1747  Chief Complaint(s) Shortness of Breath  HPI [name] is a 72 y.o. female history of COPD, diabetes, hypertension, hyperlipidemia, rheumatoid arthritis presenting to the emergency department with fever.  Patient reports that she was feeling generally well until this afternoon when she developed sudden onset of fevers and chills.  She reports some chronic cough from her COPD which is not significantly different than normal.  No productive cough.  She reports mild increased shortness of breath and does not use oxygen at home..  No abdominal pain.  She does report some dysuria and reports that she has had some persistent dysuria since being treated for urine infection recently.  She reports that never really got better after antibiotic treatment.  No headaches or neck stiffness.  No rashes.  She took Tylenol before coming to the emergency department.   Past Medical History Past Medical History:  Diagnosis Date   Asthma    COPD (chronic obstructive pulmonary disease) (HCC)    Depression    DM type 1 (diabetes mellitus, type 1) (HCC)    GERD (gastroesophageal reflux disease)    Hyperlipidemia    Hypertension    Morbid obesity (HCC)    Obstructive sleep apnea syndrome    Osteoarthritis of right knee 04/2010   end-stage   PONV (postoperative nausea and vomiting)    Rheumatoid arthritis(714.0)    on methotrexate therapy   Septic arthritis of knee, right (HCC) 04/2010   Patient Active Problem List   Diagnosis Date Noted   Severe sepsis (HCC) 09/14/2022   CAP (community acquired pneumonia) 01/08/2022   Personal history of colonic polyps    Benign neoplasm of ascending colon    Benign neoplasm of transverse colon    Benign neoplasm of descending colon    COPD exacerbation (HCC) 07/29/2017   Tachycardia 07/29/2017   Small bowel obstruction (HCC) 09/16/2016    Insulin-requiring or dependent type II diabetes mellitus (HCC) 09/16/2016   Rheumatoid arthritis (HCC) 09/16/2016   Diabetic foot ulcer (HCC) 05/10/2016   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 Authorizing Provider  acetaminophen (TYLENOL) 325 MG tablet Take 650 mg by mouth every 6 (six) hours as needed (for pain.).    [provider]  albuterol (PROVENTIL) (2.5 MG/3ML) 0.083% nebulizer solution Take 3 mLs (2.5 mg total) by nebulization every 6 (six) hours as needed for wheezing or shortness of breath (dx: J43.9). 02/25/22   Coralyn Helling, MD  albuterol (VENTOLIN HFA) 108 (90 Base) MCG/ACT inhaler USE 2 INHALATIONS EVERY 6 HOURS AS NEEDED FOR WHEEZING OR SHORTNESS OF BREATH 09/06/22   Coralyn Helling, MD  ALPRAZolam Prudy Feeler) 0.5 MG tablet Take 0.5 mg by mouth 3 (three) times daily as needed for sleep or anxiety.     [provider]  BENICAR 20 MG tablet Take 20 mg by mouth daily.  06/27/17   [provider]  carboxymethylcellulose (LUBRICANT EYE DROPS) 0.5 % SOLN 1 drop  3 (three) times daily as needed.    [provider]  COVID-19 mRNA bivalent vaccine, Pfizer, (PFIZER COVID-19 VAC BIVALENT) injection Inject into the muscle. 09/16/21   Judyann Munson, MD  diclofenac sodium (VOLTAREN) 1 % GEL Apply 2-4 g topically 4 (four) times daily as needed for pain. 08/13/18   [provider]  empagliflozin (JARDIANCE) 25 MG TABS tablet Take 25 mg by mouth daily.    [provider]  Ferrous Sulfate (IRON PO) Take by mouth.    [provider]  fluticasone (FLONASE) 50 MCG/ACT nasal spray Place 1 spray into both nostrils daily. 10/19/21   Coralyn Helling, MD  folic acid (FOLVITE) 1 MG tablet Take 1 mg by mouth daily. 09/01/21   [provider]  furosemide (LASIX) 40 MG tablet Take 40 mg by mouth daily as needed for fluid. Fluid    [provider]  gabapentin (NEURONTIN) 600 MG tablet Take 600 mg by mouth at bedtime.    [provider]  HUMALOG KWIKPEN 100 UNIT/ML KwikPen Inject 8-14 Units into the skin See admin instructions. Inject 8 units subcutaneously in the morning & inject 14 units subcutaneously at supper 08/13/18   [provider]  InFLIXimab (REMICADE IV) Inject 1 Dose into the vein every 6 (six) weeks.     [provider]  LANTUS SOLOSTAR 100 UNIT/ML Solostar Pen Inject into the skin 2 (two) times a day. Inject 50 units under the skin in the morning and 60 units in the evening. 08/29/18   [provider]  loratadine (CLARITIN) 10 MG tablet Take 10 mg by mouth daily.    [provider]  metFORMIN (GLUCOPHAGE-XR) 500 MG 24 hr tablet Take 2,000 mg by mouth daily.    [provider]  methotrexate (RHEUMATREX) 2.5 MG tablet Take 2.5 mg by mouth once a week. Caution:Chemotherapy. Protect from light.    [provider]  montelukast (SINGULAIR) 10 MG tablet TAKE 1 TABLET AT BEDTIME 02/09/22   Coralyn Helling, MD  Nystatin (GERHARDT'S BUTT CREAM) CREA Apply 1 application topically 2 (two) times daily. 10/09/20   Lazaro Arms, MD  omeprazole (PRILOSEC) 40 MG capsule TAKE 1 CAPSULE EVERY MORNING 01/10/21   Coralyn Helling, MD  sertraline (ZOLOFT) 50 MG tablet Take 50 mg by mouth daily.     [provider]  simvastatin (ZOCOR) 40 MG tablet Take 40 mg by mouth daily with supper.    [provider]  Dwyane Luo 100-62.5-25 MCG/ACT AEPB USE 1 INHALATION DAILY 02/15/22   Coralyn Helling, MD  TRULICITY 0.75 MG/0.5ML SOPN SMARTSIG:0.5 Milliliter(s) SUB-Q Once a Week 01/22/20   [provider]                                                                                                                                    Past Surgical History Past Surgical History:  Procedure Laterality Date   ABDOMINAL  HYSTERECTOMY  1992   ABDOMINAL WALL MESH  REMOVAL  08/13/2009   with removal of retained stitches   AORTO-PULMONARY WINDOW REPAIR  1992   for cardiac tamponade   APPENDECTOMY  1967   BIOPSY  10/29/2018   Procedure: BIOPSY;  Surgeon: Sherrilyn Rist, MD;  Location: [facility];  Service: Gastroenterology;;   BONE BIOPSY Right 04/19/2016   Procedure: BONE BIOPSY RIGHT GREAT TOE;  Surgeon: Ferman Hamming, DPM;  Location: [facility];  Service: Podiatry;  Laterality: Right;   CHOLECYSTECTOMY  1992   laparoscopic   COLONOSCOPY  12/08/2011   Procedure: COLONOSCOPY;  Surgeon: Malissa Hippo, MD;  Location: [facility];  Service: Endoscopy;  Laterality: N/A;  830   COLONOSCOPY WITH PROPOFOL N/A 10/16/2017   Procedure: COLONOSCOPY WITH PROPOFOL;  Surgeon: Sherrilyn Rist, MD;  Location: [facility];  Service: Gastroenterology;  Laterality: N/A;   COLONOSCOPY WITH PROPOFOL N/A 10/29/2018   Procedure: COLONOSCOPY WITH PROPOFOL;  Surgeon: Sherrilyn Rist, MD;  Location: [facility];  Service: Gastroenterology;  Laterality: N/A;   ESOPHAGOGASTRODUODENOSCOPY (EGD) WITH PROPOFOL N/A 10/29/2018   Procedure: ESOPHAGOGASTRODUODENOSCOPY (EGD) WITH PROPOFOL;  Surgeon: Sherrilyn Rist, MD;  Location: [facility];  Service: Gastroenterology;  Laterality: N/A;   FOREIGN BODY REMOVAL ABDOMINAL  01/04/2010   stitch abscesses   HERNIA REPAIR  2001   open LOA / VWH repair w mesh.  Dr. Lovell Sheehan   HERNIA REPAIR  2007   open redo LOA / VWH repair w mesh.  Dr. Lovell Sheehan   POLYPECTOMY  10/16/2017   Procedure: POLYPECTOMY;  Surgeon: Sherrilyn Rist, MD;  Location: [facility];   Service: Gastroenterology;;   POLYPECTOMY  10/29/2018   Procedure: POLYPECTOMY;  Surgeon: Sherrilyn Rist, MD;  Location: [facility];  Service: Gastroenterology;;   REVISION TOTAL KNEE ARTHROPLASTY  06/09/2010   I and D    TOTAL KNEE ARTHROPLASTY  05/07/2010   right knee   Family History Family History  Adopted: Yes  Problem Relation Age of Onset   Cancer Mother    Hyperlipidemia Mother    Diabetes Mother    Thyroid disease Mother    Diabetes Father    COPD Father    Arthritis Father        RA   Hyperlipidemia Brother    Hypertension Brother     Social History Social History   Tobacco Use   Smoking status: Former    Packs/day: 2.00    Years: 25.00    Additional pack years: 0.00    Total pack years: 50.00    Types: Cigarettes    Quit date: 04/11/1992    Years since quitting: 30.4   Smokeless tobacco: Never  Vaping Use   Vaping Use: Never used  Substance Use Topics   Alcohol use: No   Drug use: No   Allergies Demerol, Hydromorphone hcl, and Tetracycline  Review of Systems Review of Systems  All other systems reviewed and are negative.   Physical Exam Vital Signs  I have reviewed the triage vital signs BP (!) 110/55   Pulse (!) 108   Temp 100.3 F (37.9 C) (Oral)   Resp (!) 27   Wt 116.4 kg   SpO2 95%   BMI 47.72 kg/m  Physical Exam Vitals and nursing note reviewed.  Constitutional:      General: She is not in acute distress.    Appearance: She is well-developed.  HENT:     Head: Normocephalic  and atraumatic.     Mouth/Throat:     Mouth: Mucous membranes are moist.  Eyes:     Pupils: Pupils are equal, round, and reactive to light.  Cardiovascular:     Rate and Rhythm: Normal rate and regular rhythm.     Heart sounds: No murmur heard. Pulmonary:     Effort: Pulmonary effort is normal. No respiratory distress.     Breath sounds: Normal breath sounds. No wheezing or rales.  Abdominal:     General: Abdomen is flat.     Palpations: Abdomen  is soft.     Tenderness: There is no abdominal tenderness. There is left CVA tenderness. There is no right CVA tenderness.  Musculoskeletal:        General: No tenderness.     Right lower leg: No edema.     Left lower leg: No edema.  Skin:    General: Skin is warm and dry.  Neurological:     General: No focal deficit present.     Mental Status: She is alert. Mental status is at baseline.  Psychiatric:        Mood and Affect: Mood normal.        Behavior: Behavior normal.     ED Results and Treatments Labs (all labs ordered are listed, but only abnormal results are displayed) Labs Reviewed  COMPREHENSIVE METABOLIC PANEL - Abnormal; Notable for the following components:      Result Value   Glucose, Bld 102 (*)    All other components within normal limits  LACTIC ACID, PLASMA - Abnormal; Notable for the following components:   Lactic Acid, Venous 2.8 (*)    All other components within normal limits  LACTIC ACID, PLASMA - Abnormal; Notable for the following components:   Lactic Acid, Venous 3.3 (*)    All other components within normal limits  CBC WITH DIFFERENTIAL/PLATELET - Abnormal; Notable for the following components:   WBC 12.7 (*)    RBC 5.43 (*)    Hemoglobin 16.1 (*)    HCT 49.2 (*)    Platelets 142 (*)    Neutro Abs 11.4 (*)    Lymphs Abs 0.5 (*)    All other components within normal limits  URINALYSIS, W/ REFLEX TO CULTURE (INFECTION SUSPECTED) - Abnormal; Notable for the following components:   Glucose, UA 500 (*)    Hgb urine dipstick TRACE (*)    Leukocytes,Ua LARGE (*)    All other components within normal limits  RESP PANEL BY RT-PCR (RSV, FLU A&B, COVID)  RVPGX2  CULTURE, BLOOD (ROUTINE X 2)  CULTURE, BLOOD (ROUTINE X 2)  URINE CULTURE  PROTIME-INR                                                                                                                          Radiology CT CHEST ABDOMEN PELVIS W CONTRAST  Result Date: 09/14/2022 CLINICAL DATA:   Sepsis, shortness of breath, fever EXAM: CT CHEST, ABDOMEN, AND  PELVIS WITH CONTRAST TECHNIQUE: Multidetector CT imaging of the chest, abdomen and pelvis was performed following the standard protocol during bolus administration of intravenous contrast. RADIATION DOSE REDUCTION: This exam was performed according to the departmental dose-optimization program which includes automated exposure control, adjustment of the mA and/or kV according to patient size and/or use of iterative reconstruction technique. CONTRAST:  OMNIPAQUE IOHEXOL 300 MG/ML  SOLN COMPARISON:  Chest radiographs done today, previous CT chest done on 09/17/2020 FINDINGS: CT CHEST FINDINGS Cardiovascular: There is homogeneous enhancement in thoracic aorta. There are no intraluminal filling defects in central pulmonary artery branches. Coronary artery calcifications are seen. Dense calcification is seen in the region of mitral annulus and adjacent to the right anterior cardiac margin. Mediastinum/Nodes: There are enlarged lymph nodes largest in the precarinal region with no significant change, possibly suggesting reactive hyperplasia. There are scattered calcifications in thyroid. Lungs/Pleura: There is 10 mm faint ground-glass density in left upper lobe with no significant change. There is new large infiltrate in left lower lobe suggesting pneumonia. Air bronchogram is seen in this new infiltrate. There is prominence of markings in the lingula, right middle lobe and right lower lobe with interval worsening. There is no pleural effusion or pneumothorax. Musculoskeletal: No acute findings are seen. CT ABDOMEN PELVIS FINDINGS Hepatobiliary: Surgical clips are seen in gallbladder fossa. No focal abnormalities are seen in liver. Pancreas: No focal abnormalities are seen. Spleen: There is 8 mm low-density in the lateral aspect of spleen with no interval change, possibly a cyst. Adrenals/Urinary Tract: Adrenals are unremarkable. There is inhomogeneous  density in the upper pole of left kidney, most likely an artifact caused by motion. There is no hydronephrosis. 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 There is 2 mm calculus in the lower pole of left kidney. There are no calcific densities in the courses of the ureters. There is mild diffuse wall thickening in the urinary bladder. Stomach/Bowel: Stomach is not distended. There is large ventral hernia involving the right flank. Part of the stomach, many of the small bowel loops and part of the colon are noted within this large hernia. There is no evidence of obstruction or incarceration. Portions of bowel loops are not included in the images limiting evaluation. Scattered diverticula are seen in colon without signs of focal acute diverticulitis. Appendix is not visualized. There is no wall thickening in the visualized small bowel loops and colon. Vascular/Lymphatic: Scattered arterial calcifications are seen. Reproductive: Uterus is not seen. Other: There is no ascites or pneumoperitoneum. Musculoskeletal: No acute findings are seen. Degenerative changes are noted in cervical, thoracic and lumbar spine. There is mild anterolisthesis at L4-L5 level. There is spinal stenosis and encroachment of neural foramina at multiple levels. IMPRESSION: There is new large infiltrate in left lower lobe suggesting pneumonia. There is interval subtle increase in interstitial markings in the lingula, lateral segment of right middle lobe and in right lower lobe suggesting possible interstitial pneumonia. Part of this finding may suggest underlying scarring. There is no pleural effusion or pneumothorax. There is 1 cm faint ground-glass density in left upper lobe which has not changed significantly. Coronary artery calcifications are seen. There are enlarged lymph nodes in mediastinum with no significant interval change suggesting possible reactive hyperplasia. There is large ventral hernia in right flank containing much of the bowel  loops. Some of the bowel loops in the lateral aspect of the hernia are not included in the images limiting evaluation. Scattered diverticula are seen in the colon without signs  of focal diverticulitis. There is no evidence of intestinal obstruction or pneumoperitoneum. There is no hydronephrosis. There is small 2 mm calculus in the left kidney. Motion artifacts limit evaluation of upper pole of left kidney. There is mild diffuse wall thickening in the urinary bladder which may be due to incomplete distention or cystitis. Possible 8 mm cyst in the spleen. Lumbar spondylosis with spinal stenosis and encroachment of neural foramina at multiple levels. There is mild anterolisthesis at the L4-L5 level. Electronically Signed   By: Ernie Avena M.D.   On: 09/14/2022 20:41   DG Chest Port 1 View  Result Date: 09/14/2022 CLINICAL DATA:  Shortness of breath and fever EXAM: PORTABLE CHEST 1 VIEW COMPARISON:  Radiographs 04/14/2022 FINDINGS: Stable cardiomediastinal silhouette. Aortic atherosclerotic calcification. Chronic perihilar and peribronchial interstitial thickening, improved from 04/14/2022. Bibasilar atelectasis. No pleural effusion or pneumothorax. No displaced rib fracture. IMPRESSION: Decreased chronic bronchitic and interstitial changes compared with 04/14/2022. No acute abnormality. Electronically Signed   By: Minerva Fester M.D.   On: 09/14/2022 18:59    Pertinent labs & imaging results that were available during my care of the patient were reviewed by me and considered in my medical decision making (see MDM for details).  Medications Ordered in ED Medications  lactated ringers infusion (1,000 mLs Intravenous New Bag/Given 09/14/22 1910)  vancomycin (VANCOCIN) IVPB 1000 mg/200 mL premix (has no administration in time range)    And  vancomycin (VANCOCIN) IVPB 1000 mg/200 mL premix (0 mg Intravenous Stopped 09/14/22 2124)  ceFEPIme (MAXIPIME) 2 g in sodium chloride 0.9 % 100 mL IVPB (has no  administration in time range)  vancomycin (VANCOCIN) IVPB 1000 mg/200 mL premix (has no administration in time range)  ceFEPIme (MAXIPIME) 2 g in sodium chloride 0.9 % 100 mL IVPB (0 g Intravenous Stopped 09/14/22 1947)  metroNIDAZOLE (FLAGYL) IVPB 500 mg (0 mg Intravenous Stopped 09/14/22 2124)  lactated ringers bolus 2,280 mL (2,280 mLs Intravenous New Bag/Given 09/14/22 1927)  iohexol (OMNIPAQUE) 300 MG/ML solution 100 mL (100 mLs Intravenous Contrast Given 09/14/22 2001)                                                                                                                                     Procedures Procedures  (including critical care time)  Medical Decision Making / ED Course   MDM:  72 year old female presenting to the emergency department with fever.  Patient vitals notable for tachycardia, hypotensive, tachypnea, hypoxia and fever.  Appears patient is in septic shock.  Her lactic acid is elevated.  Despite this she actually looks very well considering her vital sign abnormalities.  Will give sepsis bolus, blood cultures obtained.  Urine culture obtained.  Urinalysis does seem suspicious for urinary infection but does not have bacteria in it.  She also has mild shortness of breath and hypoxia, chest x-ray without obvious pneumonia.  Given degree of sepsis will obtain CT scan  of the chest abdomen pelvis to evaluate for other occult process such as obstructing kidney stone or occult pneumonia.  No headache, meningismus to suggest meningitis.  No wounds to suggest wound or skin and soft tissue infection.  Clinical Course as of 09/14/22 2128  Wed Sep 14, 2022  2124 Discussed with Dr. Loney Loh, who will accept patient for admission. CT C/A/P shows signs of bladder infection and LLL pneumonia. Blood pressure has improved.  [WS]    Clinical Course User Index [WS] Lonell Grandchild, MD     Additional history obtained: -Additional history obtained from family -External records  from outside source obtained and reviewed including: Chart review including previous notes, labs, imaging, consultation notes including prior pulmonology notes   Lab Tests: -I ordered, reviewed, and interpreted labs.   The pertinent results include:   Labs Reviewed  COMPREHENSIVE METABOLIC PANEL - Abnormal; Notable for the following components:      Result Value   Glucose, Bld 102 (*)    All other components within normal limits  LACTIC ACID, PLASMA - Abnormal; Notable for the following components:   Lactic Acid, Venous 2.8 (*)    All other components within normal limits  LACTIC ACID, PLASMA - Abnormal; Notable for the following components:   Lactic Acid, Venous 3.3 (*)    All other components within normal limits  CBC WITH DIFFERENTIAL/PLATELET - Abnormal; Notable for the following components:   WBC 12.7 (*)    RBC 5.43 (*)    Hemoglobin 16.1 (*)    HCT 49.2 (*)    Platelets 142 (*)    Neutro Abs 11.4 (*)    Lymphs Abs 0.5 (*)    All other components within normal limits  URINALYSIS, W/ REFLEX TO CULTURE (INFECTION SUSPECTED) - Abnormal; Notable for the following components:   Glucose, UA 500 (*)    Hgb urine dipstick TRACE (*)    Leukocytes,Ua LARGE (*)    All other components within normal limits  RESP PANEL BY RT-PCR (RSV, FLU A&B, COVID)  RVPGX2  CULTURE, BLOOD (ROUTINE X 2)  CULTURE, BLOOD (ROUTINE X 2)  URINE CULTURE  PROTIME-INR    Notable for elevated lactic acid, leukocytosis  Imaging Studies ordered: I ordered imaging studies including CT C/A/P On my interpretation imaging demonstrates LLL pneumonia I independently visualized and interpreted imaging. I agree with the radiologist interpretation   Medicines ordered and prescription drug management: Meds ordered this encounter  Medications   lactated ringers infusion   ceFEPIme (MAXIPIME) 2 g in sodium chloride 0.9 % 100 mL IVPB    Order Specific Question:   Antibiotic Indication:    Answer:   Other  Indication (list below)    Order Specific Question:   Other Indication:    Answer:   Unknown source   metroNIDAZOLE (FLAGYL) IVPB 500 mg    Order Specific Question:   Antibiotic Indication:    Answer:   Other Indication (list below)    Order Specific Question:   Other Indication:    Answer:   Unknown source   DISCONTD: vancomycin (VANCOCIN) IVPB 1000 mg/200 mL premix    Order Specific Question:   Indication:    Answer:   Other Indication (list below)    Order Specific Question:   Other Indication:    Answer:   Unknown source   AND Linked Order Group    vancomycin (VANCOCIN) IVPB 1000 mg/200 mL premix     Order Specific Question:   Indication:  Answer:   Sepsis    vancomycin (VANCOCIN) IVPB 1000 mg/200 mL premix     Order Specific Question:   Indication:     Answer:   Sepsis   lactated ringers bolus 2,280 mL   ceFEPIme (MAXIPIME) 2 g in sodium chloride 0.9 % 100 mL IVPB    Order Specific Question:   Antibiotic Indication:    Answer:   Sepsis   vancomycin (VANCOCIN) IVPB 1000 mg/200 mL premix    Order Specific Question:   Indication:    Answer:   Sepsis   iohexol (OMNIPAQUE) 300 MG/ML solution 100 mL    -I have reviewed the patients home medicines and have made adjustments as needed   Consultations Obtained: I requested consultation with the hospitalist,  and discussed lab and imaging findings as well as pertinent plan - they recommend: admission   Cardiac Monitoring: The patient was maintained on a cardiac monitor.  I personally viewed and interpreted the cardiac monitored which showed an underlying rhythm of: NSR  Social Determinants of Health:  Diagnosis or treatment significantly limited by social determinants of health: obesity   Reevaluation: After the interventions noted above, I reevaluated the patient and found that their symptoms have improved  Co morbidities that complicate the patient evaluation  Past Medical History:  Diagnosis Date   Asthma    COPD  (chronic obstructive pulmonary disease) (HCC)    Depression    DM type 1 (diabetes mellitus, type 1) (HCC)    GERD (gastroesophageal reflux disease)    Hyperlipidemia    Hypertension    Morbid obesity (HCC)    Obstructive sleep apnea syndrome    Osteoarthritis of right knee 04/2010   end-stage   PONV (postoperative nausea and vomiting)    Rheumatoid arthritis(714.0)    on methotrexate therapy   Septic arthritis of knee, right (HCC) 04/2010      Dispostion: Disposition decision including need for hospitalization was considered, and patient admitted to the hospital.    Final Clinical Impression(s) / ED Diagnoses Final diagnoses:  Community acquired pneumonia of left lower lobe of lung  Septic shock (HCC)     This chart was dictated using voice recognition software.  Despite best efforts to proofread,  errors can occur which can change the documentation meaning.    Lonell Grandchild, MD 09/14/22 2128

## 2022-09-14 NOTE — ED Notes (Signed)
Date and time results received: 09/14/22 2116 (use smartphrase ".now" to insert current time)  Test: lactic acid Critical Value: 3.3  Name of Provider Notified: Doreene Eland, MD  Orders Received? Or Actions Taken?:  n/a

## 2022-09-14 NOTE — ED Provider Notes (Signed)
  Provider Note MRN:  [id number]  Arrival date & time: 09/15/22    ED Course and Medical Decision Making  Assumed care from Dr. Suezanne Jacquet at shift change.  Sepsis due to pneumonia, soft blood pressures providing fluids and will monitor closely.  Procedures  Final Clinical Impressions(s) / ED Diagnoses     ICD-10-CM   1. Community acquired pneumonia of left lower lobe of lung  J18.9     2. Septic shock (HCC)  A41.9    R65.21       ED Discharge Orders     None       Discharge Instructions   None     Elmer Sow. Pilar Plate, MD [facility] Emergency Medicine [facility] [email or web address]    Sabas Sous, MD 09/15/22 [phone]

## 2022-09-14 NOTE — ED Notes (Signed)
Notified pt's daughter of her room assignment and will call her again when [facility] transports pt.

## 2022-09-14 NOTE — Progress Notes (Signed)
Plan of Care Note for accepted transfer   Patient: [name] MRN: [id number]   DOA: 09/14/2022  Facility requesting transfer: [facility] emergency department  Requesting Provider: Dr. Suezanne Jacquet  Reason for transfer: Severe sepsis secondary to pneumonia and possible UTI, acute hypoxemic respiratory failure  Facility course: 72 year old female with a past medical history of COPD, diabetes, hypertension, hyperlipidemia, rheumatoid arthritis, OSA presented to the ED with complaints of shortness of breath and fever.  Temperature 103 F on arrival, tachycardic to the 110s, tachypneic to the high 20s/low 30s, hypotensive with systolic as low as 80s in the ED.  Oxygen saturation 87% on room air, improved to 92% on 3 L [place].  Labs significant for WBC 12.7, lactate 2.8.  UA with large amount of leukocytes and microscopy showing greater than 50 WBCs.  Urine and blood cultures pending.  COVID/influenza/RSV PCR negative.  CT chest/abdomen/pelvis showing: "IMPRESSION: There is new large infiltrate in left lower lobe suggesting pneumonia. There is interval subtle increase in interstitial markings in the lingula, lateral segment of right middle lobe and in right lower lobe suggesting possible interstitial pneumonia. Part of this finding may suggest underlying scarring. There is no pleural effusion or pneumothorax.   There is 1 cm faint ground-glass density in left upper lobe which has not changed significantly. Coronary artery calcifications are seen. There are enlarged lymph nodes in mediastinum with no significant interval change suggesting possible reactive hyperplasia.   There is large ventral hernia in right flank containing much of the bowel loops. Some of the bowel loops in the lateral aspect of the hernia are not included in the images limiting evaluation. Scattered diverticula are seen in the colon without signs of focal diverticulitis.   There is no evidence of intestinal obstruction or  pneumoperitoneum. There is no hydronephrosis.   There is small 2 mm calculus in the left kidney. Motion artifacts limit evaluation of upper pole of left kidney. There is mild diffuse wall thickening in the urinary bladder which may be due to incomplete distention or cystitis.   Possible 8 mm cyst in the spleen. Lumbar spondylosis with spinal stenosis and encroachment of neural foramina at multiple levels. There is mild anterolisthesis at the L4-L5 level."  Patient was given vancomycin, cefepime, metronidazole, and 30 cc/kg fluid boluses per sepsis protocol.  Systolic blood pressure improved to 110s after IV fluids.  Plan of care: The patient is accepted for admission to Progressive unit, at [facility]  Author: John Giovanni, MD 09/14/2022  Check [email or web address] for on-call coverage.  Nursing staff, Please call [facility] Admits & Consults System-Wide number on Amion as soon as patient's arrival, so appropriate admitting provider can evaluate the pt.

## 2022-09-14 NOTE — ED Notes (Signed)
RT placed patient on 3L [place] due to the fact that the patient had a saturation 87% on room air at rest. Patient stated she felt like she could breath easier on the nasal cannula. Currently her oxygen saturation is 92% on 3L [place].

## 2022-09-14 NOTE — ED Triage Notes (Signed)
Started after 3 pm feeling sob and fever 102. Took tylenol 3:30pm  crackles

## 2022-09-14 NOTE — Progress Notes (Signed)
Pharmacy Antibiotic Note  [name] is a 72 y.o. female admitted on 09/14/2022 with sepsis.  Pharmacy has been consulted for cefepime and vancomycin dosing.  Plan: Give IV Vancomycin 2000mg  x 1 for loading dose, followed by IV Vancomycin 1000 every 24 hours for eAUC400.  Cefepime 2g Q8H.  Follow culture data for de-escalation.  Monitor renal function for dose adjustments as indicated.    Weight: 116.4 kg (256 lb 11.2 oz)  Temp (24hrs), Avg:103 F (39.4 C), Min:103 F (39.4 C), Max:103 F (39.4 C)  Recent Labs  Lab 09/14/22 1759  WBC 12.7*  CREATININE 0.88  LATICACIDVEN 2.8*    Estimated Creatinine Clearance: 70.4 mL/min (by C-G formula based on SCr of 0.88 mg/dL).    Allergies  Allergen Reactions   Demerol Nausea And Vomiting    Severe   Hydromorphone Hcl Itching    All over the body.   Tetracycline Hives    Thank you for allowing pharmacy to be a part of this patient's care.  Estill Batten, PharmD, BCCCP  09/14/2022 7:16 PM

## 2022-09-14 NOTE — ED Notes (Signed)
IV's stopped for CT scan

## 2022-09-14 NOTE — Sepsis Progress Note (Signed)
[facility] monitoring for the code sepsis protocol.  

## 2022-09-15 ENCOUNTER — Encounter (HOSPITAL_COMMUNITY): Payer: Self-pay | Admitting: Family Medicine

## 2022-09-15 ENCOUNTER — Inpatient Hospital Stay (HOSPITAL_COMMUNITY): Payer: Medicare Other

## 2022-09-15 ENCOUNTER — Encounter (HOSPITAL_BASED_OUTPATIENT_CLINIC_OR_DEPARTMENT_OTHER): Payer: Self-pay | Admitting: Pulmonary Disease

## 2022-09-15 DIAGNOSIS — Z794 Long term (current) use of insulin: Secondary | ICD-10-CM | POA: Diagnosis not present

## 2022-09-15 DIAGNOSIS — Z87891 Personal history of nicotine dependence: Secondary | ICD-10-CM | POA: Diagnosis not present

## 2022-09-15 DIAGNOSIS — R0602 Shortness of breath: Secondary | ICD-10-CM | POA: Diagnosis present

## 2022-09-15 DIAGNOSIS — I10 Essential (primary) hypertension: Secondary | ICD-10-CM | POA: Diagnosis present

## 2022-09-15 DIAGNOSIS — J440 Chronic obstructive pulmonary disease with acute lower respiratory infection: Secondary | ICD-10-CM | POA: Diagnosis present

## 2022-09-15 DIAGNOSIS — A419 Sepsis, unspecified organism: Secondary | ICD-10-CM | POA: Diagnosis not present

## 2022-09-15 DIAGNOSIS — A403 Sepsis due to Streptococcus pneumoniae: Secondary | ICD-10-CM | POA: Diagnosis present

## 2022-09-15 DIAGNOSIS — Z8249 Family history of ischemic heart disease and other diseases of the circulatory system: Secondary | ICD-10-CM | POA: Diagnosis not present

## 2022-09-15 DIAGNOSIS — J9601 Acute respiratory failure with hypoxia: Secondary | ICD-10-CM | POA: Diagnosis present

## 2022-09-15 DIAGNOSIS — G4733 Obstructive sleep apnea (adult) (pediatric): Secondary | ICD-10-CM | POA: Diagnosis present

## 2022-09-15 DIAGNOSIS — Z7951 Long term (current) use of inhaled steroids: Secondary | ICD-10-CM | POA: Diagnosis not present

## 2022-09-15 DIAGNOSIS — J439 Emphysema, unspecified: Secondary | ICD-10-CM | POA: Diagnosis present

## 2022-09-15 DIAGNOSIS — Z79631 Long term (current) use of antimetabolite agent: Secondary | ICD-10-CM | POA: Diagnosis not present

## 2022-09-15 DIAGNOSIS — Z7984 Long term (current) use of oral hypoglycemic drugs: Secondary | ICD-10-CM | POA: Diagnosis not present

## 2022-09-15 DIAGNOSIS — Z79899 Other long term (current) drug therapy: Secondary | ICD-10-CM | POA: Diagnosis not present

## 2022-09-15 DIAGNOSIS — Z1152 Encounter for screening for COVID-19: Secondary | ICD-10-CM | POA: Diagnosis not present

## 2022-09-15 DIAGNOSIS — E785 Hyperlipidemia, unspecified: Secondary | ICD-10-CM | POA: Diagnosis present

## 2022-09-15 DIAGNOSIS — J189 Pneumonia, unspecified organism: Secondary | ICD-10-CM | POA: Diagnosis present

## 2022-09-15 DIAGNOSIS — R6521 Severe sepsis with septic shock: Secondary | ICD-10-CM | POA: Diagnosis present

## 2022-09-15 DIAGNOSIS — D849 Immunodeficiency, unspecified: Secondary | ICD-10-CM | POA: Diagnosis present

## 2022-09-15 DIAGNOSIS — K219 Gastro-esophageal reflux disease without esophagitis: Secondary | ICD-10-CM | POA: Diagnosis present

## 2022-09-15 DIAGNOSIS — Z6841 Body Mass Index (BMI) 40.0 and over, adult: Secondary | ICD-10-CM | POA: Diagnosis not present

## 2022-09-15 DIAGNOSIS — M069 Rheumatoid arthritis, unspecified: Secondary | ICD-10-CM | POA: Diagnosis present

## 2022-09-15 DIAGNOSIS — Z9049 Acquired absence of other specified parts of digestive tract: Secondary | ICD-10-CM | POA: Diagnosis not present

## 2022-09-15 DIAGNOSIS — E119 Type 2 diabetes mellitus without complications: Secondary | ICD-10-CM | POA: Diagnosis present

## 2022-09-15 LAB — BLOOD CULTURE ID PANEL (REFLEXED) - BCID2

## 2022-09-15 LAB — BASIC METABOLIC PANEL
Anion gap: 10 (ref 5–15)
BUN: 20 mg/dL (ref 8–23)
CO2: 23 mmol/L (ref 22–32)
Calcium: 8.1 mg/dL — ABNORMAL LOW (ref 8.9–10.3)
Chloride: 101 mmol/L (ref 98–111)
Creatinine, Ser: 0.99 mg/dL (ref 0.44–1.00)
GFR, Estimated: >60 mL/min (ref >60)
Glucose, Bld: 80 mg/dL (ref 70–99)
Potassium: 4.1 mmol/L (ref 3.5–5.1)
Sodium: 134 mmol/L — ABNORMAL LOW (ref 135–145)

## 2022-09-15 LAB — GLUCOSE, CAPILLARY
Glucose-Capillary: 97 mg/dL (ref 70–99)
Glucose-Capillary: 102 mg/dL — ABNORMAL HIGH (ref 70–99)
Glucose-Capillary: 186 mg/dL — ABNORMAL HIGH (ref 70–99)
Glucose-Capillary: 106 mg/dL — ABNORMAL HIGH (ref 70–99)
Glucose-Capillary: 155 mg/dL — ABNORMAL HIGH (ref 70–99)

## 2022-09-15 LAB — HEMOGLOBIN A1C
Hgb A1c MFr Bld: 6.1 % — ABNORMAL HIGH (ref 4.8–5.6)
Mean Plasma Glucose: 128.37 mg/dL

## 2022-09-15 LAB — LACTIC ACID, PLASMA
Lactic Acid, Venous: 2 mmol/L (ref 0.5–1.9)
Lactic Acid, Venous: 2 mmol/L (ref 0.5–1.9)

## 2022-09-15 LAB — MRSA NEXT GEN BY PCR, NASAL: MRSA by PCR Next Gen: NOT DETECTED

## 2022-09-15 LAB — CULTURE, BLOOD (ROUTINE X 2)

## 2022-09-15 LAB — CBC
HCT: 42 % (ref 36.0–46.0)
Hemoglobin: 13.8 g/dL (ref 12.0–15.0)
MCH: 30.3 pg (ref 26.0–34.0)
MCHC: 32.9 g/dL (ref 30.0–36.0)
MCV: 92.1 fL (ref 80.0–100.0)
Platelets: 132 10*3/uL — ABNORMAL LOW (ref 150–400)
RBC: 4.56 MIL/uL (ref 3.87–5.11)
RDW: 14.8 % (ref 11.5–15.5)
WBC: 18.8 10*3/uL — ABNORMAL HIGH (ref 4.0–10.5)
nRBC: 0 % (ref 0.0–0.2)

## 2022-09-15 LAB — URINE CULTURE: Culture: NO GROWTH

## 2022-09-15 LAB — C DIFFICILE QUICK SCREEN W PCR REFLEX
C Diff antigen: NEGATIVE
C Diff interpretation: NOT DETECTED
C Diff toxin: NEGATIVE

## 2022-09-15 LAB — PROCALCITONIN: Procalcitonin: 8.11 ng/mL

## 2022-09-15 MED ORDER — GUAIFENESIN ER 600 MG PO TB12
600.0000 mg | ORAL_TABLET | Freq: Two times a day (BID) | ORAL | Status: DC
  Administered 2022-09-15 – 2022-09-19 (×9): 600 mg via ORAL
  Filled 2022-09-15 (×9): qty 1

## 2022-09-15 MED ORDER — ALBUTEROL SULFATE (2.5 MG/3ML) 0.083% IN NEBU
2.5000 mg | INHALATION_SOLUTION | Freq: Four times a day (QID) | RESPIRATORY_TRACT | Status: DC
  Administered 2022-09-15 – 2022-09-17 (×8): 2.5 mg via RESPIRATORY_TRACT
  Filled 2022-09-15 (×8): qty 3

## 2022-09-15 MED ORDER — FLUTICASONE FUROATE-VILANTEROL 100-25 MCG/ACT IN AEPB
1.0000 | INHALATION_SPRAY | Freq: Every day | RESPIRATORY_TRACT | Status: DC
  Administered 2022-09-15 – 2022-09-19 (×4): 1 via RESPIRATORY_TRACT
  Filled 2022-09-15 (×2): qty 28

## 2022-09-15 MED ORDER — ENOXAPARIN SODIUM 60 MG/0.6ML IJ SOSY
60.0000 mg | PREFILLED_SYRINGE | INTRAMUSCULAR | Status: DC
  Administered 2022-09-15 – 2022-09-19 (×5): 60 mg via SUBCUTANEOUS
  Filled 2022-09-15 (×6): qty 0.6

## 2022-09-15 MED ORDER — SODIUM CHLORIDE 0.9% FLUSH
3.0000 mL | Freq: Two times a day (BID) | INTRAVENOUS | Status: DC
  Administered 2022-09-15 – 2022-09-19 (×8): 3 mL via INTRAVENOUS

## 2022-09-15 MED ORDER — INSULIN ASPART 100 UNIT/ML IJ SOLN
0.0000 [IU] | Freq: Every day | INTRAMUSCULAR | Status: DC
  Administered 2022-09-17: 3 [IU] via SUBCUTANEOUS
  Administered 2022-09-18: 2 [IU] via SUBCUTANEOUS

## 2022-09-15 MED ORDER — ALBUTEROL SULFATE (2.5 MG/3ML) 0.083% IN NEBU
2.5000 mg | INHALATION_SOLUTION | Freq: Four times a day (QID) | RESPIRATORY_TRACT | Status: DC | PRN
  Administered 2022-09-18: 2.5 mg via RESPIRATORY_TRACT
  Filled 2022-09-15 (×2): qty 3

## 2022-09-15 MED ORDER — UMECLIDINIUM BROMIDE 62.5 MCG/ACT IN AEPB
1.0000 | INHALATION_SPRAY | Freq: Every day | RESPIRATORY_TRACT | Status: DC
  Administered 2022-09-15 – 2022-09-19 (×4): 1 via RESPIRATORY_TRACT
  Filled 2022-09-15 (×2): qty 7

## 2022-09-15 MED ORDER — MONTELUKAST SODIUM 10 MG PO TABS
10.0000 mg | ORAL_TABLET | Freq: Every day | ORAL | Status: DC
  Administered 2022-09-15 – 2022-09-18 (×4): 10 mg via ORAL
  Filled 2022-09-15 (×4): qty 1

## 2022-09-15 MED ORDER — DICLOFENAC SODIUM 1 % EX GEL: 2.0000 g | Freq: Four times a day (QID) | CUTANEOUS | Status: DC | PRN

## 2022-09-15 MED ORDER — GUAIFENESIN-DM 100-10 MG/5ML PO SYRP
5.0000 mL | ORAL_SOLUTION | ORAL | Status: DC | PRN
  Administered 2022-09-15 – 2022-09-18 (×3): 5 mL via ORAL
  Filled 2022-09-15 (×3): qty 5

## 2022-09-15 MED ORDER — GABAPENTIN 300 MG PO CAPS
600.0000 mg | ORAL_CAPSULE | Freq: Every day | ORAL | Status: DC
  Administered 2022-09-15 – 2022-09-18 (×4): 600 mg via ORAL
  Filled 2022-09-15 (×4): qty 2

## 2022-09-15 MED ORDER — INSULIN GLARGINE-YFGN 100 UNIT/ML ~~LOC~~ SOLN
10.0000 [IU] | Freq: Every day | SUBCUTANEOUS | Status: DC
  Administered 2022-09-15 – 2022-09-18 (×4): 10 [IU] via SUBCUTANEOUS
  Filled 2022-09-15 (×5): qty 0.1

## 2022-09-15 MED ORDER — ACETAMINOPHEN 500 MG PO TABS
1000.0000 mg | ORAL_TABLET | Freq: Once | ORAL | Status: AC
  Administered 2022-09-15: 1000 mg via ORAL
  Filled 2022-09-15: qty 2

## 2022-09-15 MED ORDER — SILVER SULFADIAZINE 1 % EX CREA
TOPICAL_CREAM | Freq: Every day | CUTANEOUS | Status: DC
  Filled 2022-09-15: qty 85

## 2022-09-15 MED ORDER — ACETAMINOPHEN 325 MG PO TABS
650.0000 mg | ORAL_TABLET | Freq: Four times a day (QID) | ORAL | Status: DC | PRN
  Administered 2022-09-15 – 2022-09-17 (×5): 650 mg via ORAL
  Filled 2022-09-15 (×5): qty 2

## 2022-09-15 MED ORDER — LACTATED RINGERS IV BOLUS
1000.0000 mL | Freq: Once | INTRAVENOUS | Status: AC
  Administered 2022-09-15: 1000 mL via INTRAVENOUS

## 2022-09-15 MED ORDER — SODIUM CHLORIDE 0.9 % IV SOLN
2.0000 g | Freq: Every day | INTRAVENOUS | Status: DC
  Administered 2022-09-16 – 2022-09-19 (×4): 2 g via INTRAVENOUS
  Filled 2022-09-15 (×4): qty 20

## 2022-09-15 MED ORDER — SENNOSIDES-DOCUSATE SODIUM 8.6-50 MG PO TABS: 1.0000 | ORAL_TABLET | Freq: Every evening | ORAL | Status: DC | PRN

## 2022-09-15 MED ORDER — SODIUM CHLORIDE 0.9 % IV SOLN
500.0000 mg | Freq: Every day | INTRAVENOUS | Status: DC
  Administered 2022-09-15: 500 mg via INTRAVENOUS
  Filled 2022-09-15: qty 5

## 2022-09-15 MED ORDER — SERTRALINE HCL 50 MG PO TABS
50.0000 mg | ORAL_TABLET | Freq: Every day | ORAL | Status: DC
  Administered 2022-09-15 – 2022-09-19 (×5): 50 mg via ORAL
  Filled 2022-09-15 (×5): qty 1

## 2022-09-15 MED ORDER — SODIUM CHLORIDE 0.9 % IV SOLN: INTRAVENOUS | Status: DC | PRN

## 2022-09-15 MED ORDER — LACTATED RINGERS IV SOLN: 150.0000 mL/h | INTRAVENOUS | Status: DC

## 2022-09-15 MED ORDER — INSULIN ASPART 100 UNIT/ML IJ SOLN
0.0000 [IU] | Freq: Three times a day (TID) | INTRAMUSCULAR | Status: DC
  Administered 2022-09-15 – 2022-09-16 (×2): 1 [IU] via SUBCUTANEOUS
  Administered 2022-09-16: 3 [IU] via SUBCUTANEOUS
  Administered 2022-09-16: 1 [IU] via SUBCUTANEOUS
  Administered 2022-09-17: 3 [IU] via SUBCUTANEOUS
  Administered 2022-09-17 – 2022-09-18 (×3): 1 [IU] via SUBCUTANEOUS
  Administered 2022-09-18 – 2022-09-19 (×3): 2 [IU] via SUBCUTANEOUS

## 2022-09-15 MED ORDER — ONDANSETRON HCL 4 MG PO TABS: 4.0000 mg | ORAL_TABLET | Freq: Four times a day (QID) | ORAL | Status: DC | PRN

## 2022-09-15 MED ORDER — PANTOPRAZOLE SODIUM 40 MG PO TBEC
40.0000 mg | DELAYED_RELEASE_TABLET | Freq: Every day | ORAL | Status: DC
  Administered 2022-09-15 – 2022-09-19 (×5): 40 mg via ORAL
  Filled 2022-09-15 (×5): qty 1

## 2022-09-15 MED ORDER — ALPRAZOLAM 0.5 MG PO TABS
0.5000 mg | ORAL_TABLET | Freq: Three times a day (TID) | ORAL | Status: DC | PRN
  Administered 2022-09-15 – 2022-09-18 (×4): 0.5 mg via ORAL
  Filled 2022-09-15 (×4): qty 1

## 2022-09-15 MED ORDER — SIMVASTATIN 20 MG PO TABS
40.0000 mg | ORAL_TABLET | Freq: Every day | ORAL | Status: DC
  Administered 2022-09-15 – 2022-09-18 (×4): 40 mg via ORAL
  Filled 2022-09-15 (×4): qty 2

## 2022-09-15 MED ORDER — ENOXAPARIN SODIUM 40 MG/0.4ML IJ SOSY: 40.0000 mg | PREFILLED_SYRINGE | INTRAMUSCULAR | Status: DC

## 2022-09-15 MED ORDER — SODIUM CHLORIDE 0.9 % IV SOLN
2.0000 g | Freq: Every day | INTRAVENOUS | Status: DC
  Administered 2022-09-15: 2 g via INTRAVENOUS
  Filled 2022-09-15: qty 20

## 2022-09-15 MED ORDER — ONDANSETRON HCL 4 MG/2ML IJ SOLN: 4.0000 mg | Freq: Four times a day (QID) | INTRAMUSCULAR | Status: DC | PRN

## 2022-09-15 MED ORDER — ACETAMINOPHEN 650 MG RE SUPP: 650.0000 mg | Freq: Four times a day (QID) | RECTAL | Status: DC | PRN

## 2022-09-15 MED ORDER — LACTATED RINGERS IV BOLUS: 1000.0000 mL | Freq: Once | INTRAVENOUS | Status: DC

## 2022-09-15 NOTE — Consult Note (Signed)
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 Right great toe most distal medial aspect 2.5 cm x 2 cm area of hyperkeratotic tissue with 1 cm x 1 cm x 0.2 cm full thickness wound in the middle, wound bed dry  2.  Left plantar aspect great toe linear full thickness wound 2 cm x 0.2 cm x 0.2 cm 100% pink and moist   Drainage (amount, consistency, odor) none Right great toe, left great plantar aspect minimal serosanguinous  Periwound:R great toe heavily callused, L toe intact  Dressing procedure/placement/frequency: Clean B toe wounds with NS, apply Silvadene cream to wound beds daily, cover with dry gauze and wrap toes with conform roll gauze. Secure with paper tape.  BE SURE TO TAKE WASH CLOTH AND CLEAN OFF OLD SILVADENE CREAM BEFORE APPLYING NEW DAILY.    POC discussed with patient and bedside nurse.  WOC team will not follow patient at this time. Re-consult if further needs arise.   Patient states she has a follow-up appointment with podiatry next week.    Thank you,    Priscella Mann MSN, RN-BC, [employer] [phone]

## 2022-09-15 NOTE — ED Notes (Signed)
Date and time results received: 09/15/22 0021 (use smartphrase ".now" to insert current time)  Test: lactic acid Critical Value: 2.0  Name of Provider Notified: Randol Kern, MD  Orders Received? Or Actions Taken?:  n/a

## 2022-09-15 NOTE — Progress Notes (Signed)
Modified Barium Swallow Study  Patient Details  Name: [name] MRN: [id number] Date of Birth: 11/15/1950  Today's Date: 09/15/2022  Modified Barium Swallow completed.  Full report located under Chart Review in the Imaging Section.  History of Present Illness [name] is a pleasant 72 y.o. female with medical history significant for COPD, OSA on CPAP, rheumatoid arthritis, hypertension, insulin-dependent diabetes mellitus, and BMI 48 w ho presents to the ED with shortness of breath and fever. Has recurrent pna. CT chest.  There is 10 mm faint ground-glass density in left  upper lobe with no significant change. There is new large infiltrate  in left lower lobe suggesting pneumonia. Air bronchogram is seen in  this new infiltrate. There is prominence of markings in the lingula,  right middle lobe and right lower lobe with interval worsening.   Clinical Impression Pt demonstrates a slight delay in swallow initaition with thin and nectar thick liquids penetrating the vestibule with immediate ejection during the height of the swallow PAS 2/flash penetration. No aspiration, no residue. Risk of silent/microaspiration very low. Pt almost always coughs after a swallow though airway clear. Highly sensate. Recommend pt continue regular diet and thin liquids. No SLP f/u needed for dysphagia. Factors that may increase risk of adverse event in presence of aspiration Rubye Oaks & Clearance Coots 2021): Poor general health and/or compromised immunity  Swallow Evaluation Recommendations Recommendations: PO diet PO Diet Recommendation: Regular;Thin liquids (Level 0) Liquid Administration via: Cup;Straw Medication Administration: Whole meds with liquid Supervision: Patient able to self-feed Postural changes: Position pt fully upright for meals Oral care recommendations: Oral care BID (2x/day)   Harlon Ditty, MA CCC-SLP  Acute Rehabilitation Services Secure Chat Preferred Office [phone]    Claudine Mouton 09/15/2022,12:43 PM

## 2022-09-15 NOTE — Progress Notes (Signed)
PROGRESS NOTE    [name]  JYN:[id number] DOB: 02-Jul-1950 DOA: 09/14/2022 PCP: Carylon Perches, MD   Brief Narrative: 72 year old with past medical history significant for COPD, OSA on CPAP, rheumatoid arthritis, hypertension, insulin-dependent diabetes, BMI 48 who presents to the ED complaining of shortness of breath and fever.  Evaluation in the ED patient was found to be febrile, hypoxic oxygen saturation 88 on room air hypotensive systolic blood pressure 83.  White blood cell 12.  Lactic acid 2.8.  CT demonstrating large left lower lobe infiltrate and mild diffuse urinary bladder wall thickening.  Patient admitted with sepsis secondary to pneumonia, acute hypoxic respiratory failure.   Assessment & Plan:   Principal Problem:   Sepsis due to pneumonia [facility]) Active Problems:   Obstructive sleep apnea   COPD (chronic obstructive pulmonary disease) with emphysema (HCC)   Insulin-requiring or dependent type II diabetes mellitus (HCC)   Rheumatoid arthritis (HCC)   1-Sepsis secondary to pneumonia: -Patient presents with fever, SOB, Cough. CT chest:  new large infiltrate in left lower lobe suggesting pneumonia. There is interval subtle increase in interstitial markings in the lingula, lateral segment of right middle lobe and in right lower lobe suggesting possible interstitial pneumonia -She report 4 episodes of PNA last year. Speech therapist consulted.  -Check MRSA PCR.  --Follow Blood culture.  -Continue with Ceftriaxone and Azithro.   Acute hypoxic respiratory failure secondary to pneumonia: Continue with oxygen supplementation.  Continue with inhaler. Albuterol Schedule.  Start Guaifenesin.  Flutter valve.    COPD, OSA: Continue with Breo.  CPAP  Insulin-dependent Diabetes type 2: Start Insulin while in the hospital.  Resume Trulicity metformin at discharge.   Rheumatoid arthritis: On methotrexate and Remicade at home.     Estimated body mass index is 47.74 kg/m as  calculated from the following:   Height as of this encounter: 5\' 2"  (1.575 m).   Weight as of this encounter: 118.4 kg.   DVT prophylaxis: Lovenox Code Status: Full code Family Communication: Crae discussed with patient Disposition Plan:  Status is: Inpatient Remains inpatient appropriate because: management of sepsis.     Consultants:  None  Procedures:  none  Antimicrobials:    Subjective: She report persistent cough, some SOB. She feels better than on admission. She report hip pain, likely from been in the bed.   Objective: Vitals:   09/15/22 0041 09/15/22 0054 09/15/22 0138 09/15/22 0456  BP: 98/66   (!) 94/45  Pulse: (!) 104   86  Resp: 20   18  Temp: 99.5 F (37.5 C)   98.8 F (37.1 C)  TempSrc: Oral   Oral  SpO2: 92%   95%  Weight:  118.4 kg    Height:   5\' 2"  (1.575 m)     Intake/Output Summary (Last 24 hours) at 09/15/2022 0732 Last data filed at 09/15/2022 0648 Gross per 24 hour  Intake 4450.67 ml  Output --  Net 4450.67 ml   Filed Weights   09/14/22 1825 09/15/22 0054  Weight: 116.4 kg 118.4 kg    Examination:  General exam: Appears calm and comfortable  Respiratory system: Clear to auscultation. Respiratory effort normal. Cardiovascular system: S1 & S2 heard, RRR. No JVD, murmurs, rubs, gallops or clicks. No pedal edema. Gastrointestinal system: Abdomen is nondistended, soft and nontender. No organomegaly or masses felt. Normal bowel sounds heard. Central nervous system: Alert and oriented.  Extremities: Symmetric 5 x 5 power.   Data Reviewed: I have personally reviewed following labs and  imaging studies  CBC: Recent Labs  Lab 09/14/22 1759 09/15/22 0301  WBC 12.7* 18.8*  NEUTROABS 11.4*  --   HGB 16.1* 13.8  HCT 49.2* 42.0  MCV 90.6 92.1  PLT 142* 132*   Basic Metabolic Panel: Recent Labs  Lab 09/14/22 1759 09/15/22 0301  NA 138 134*  K 4.9 4.1  CL 101 101  CO2 27 23  GLUCOSE 102* 80  BUN 19 20  CREATININE 0.88 0.99   CALCIUM 9.8 8.1*   GFR: Estimated Creatinine Clearance: 63.7 mL/min (by C-G formula based on SCr of 0.99 mg/dL). Liver Function Tests: Recent Labs  Lab 09/14/22 1759  AST 23  ALT 24  ALKPHOS 68  BILITOT 0.8  PROT 6.9  ALBUMIN 4.1   No results for input(s): "LIPASE", "AMYLASE" in the last 168 hours. No results for input(s): "AMMONIA" in the last 168 hours. Coagulation Profile: Recent Labs  Lab 09/14/22 1759  INR 1.1   Cardiac Enzymes: No results for input(s): "CKTOTAL", "CKMB", "CKMBINDEX", "TROPONINI" in the last 168 hours. BNP (last 3 results) No results for input(s): "PROBNP" in the last 8760 hours. HbA1C: Recent Labs    09/15/22 0249  HGBA1C 6.1*   CBG: Recent Labs  Lab 09/15/22 0242  GLUCAP 97   Lipid Profile: No results for input(s): "CHOL", "HDL", "LDLCALC", "TRIG", "CHOLHDL", "LDLDIRECT" in the last 72 hours. Thyroid Function Tests: No results for input(s): "TSH", "T4TOTAL", "FREET4", "T3FREE", "THYROIDAB" in the last 72 hours. Anemia Panel: No results for input(s): "VITAMINB12", "FOLATE", "FERRITIN", "TIBC", "IRON", "RETICCTPCT" in the last 72 hours. Sepsis Labs: Recent Labs  Lab 09/14/22 1759 09/14/22 2021 09/14/22 2350 09/15/22 0252 09/15/22 0301  PROCALCITON  --   --   --  8.11  --   LATICACIDVEN 2.8* 3.3* 2.0*  --  2.0*    Recent Results (from the past 240 hour(s))  Resp panel by RT-PCR (RSV, Flu A&B, Covid) Anterior Nasal Swab     Status: None   Collection Time: 09/14/22  7:20 PM   Specimen: Anterior Nasal Swab  Result Value Ref Range Status   SARS Coronavirus 2 by RT PCR NEGATIVE NEGATIVE Final    Comment: (NOTE) SARS-CoV-2 target nucleic acids are NOT DETECTED.  The SARS-CoV-2 RNA is generally detectable in upper respiratory specimens during the acute phase of infection. The lowest concentration of SARS-CoV-2 viral copies this assay can detect is 138 copies/mL. A negative result does not preclude SARS-Cov-2 infection and should  not be used as the sole basis for treatment or other patient management decisions. A negative result may occur with  improper specimen collection/handling, submission of specimen other than nasopharyngeal swab, presence of viral mutation(s) within the areas targeted by this assay, and inadequate number of viral copies(<138 copies/mL). A negative result must be combined with clinical observations, patient history, and epidemiological information. The expected result is Negative.  Fact Sheet for Patients:  BloggerCourse.com  Fact Sheet for Healthcare Providers:  SeriousBroker.it  This test is no t yet approved or cleared by the [place] FDA and  has been authorized for detection and/or diagnosis of SARS-CoV-2 by FDA under an Emergency Use Authorization (EUA). This EUA will remain  in effect (meaning this test can be used) for the duration of the COVID-19 declaration under Section 564(b)(1) of the Act, 21 U.S.C.section 360bbb-3(b)(1), unless the authorization is terminated  or revoked sooner.       Influenza A by PCR NEGATIVE NEGATIVE Final   Influenza B by PCR NEGATIVE NEGATIVE Final  Comment: (NOTE) The Xpert Xpress SARS-CoV-2/FLU/RSV plus assay is intended as an aid in the diagnosis of influenza from Nasopharyngeal swab specimens and should not be used as a sole basis for treatment. Nasal washings and aspirates are unacceptable for Xpert Xpress SARS-CoV-2/FLU/RSV testing.  Fact Sheet for Patients: BloggerCourse.com  Fact Sheet for Healthcare Providers: SeriousBroker.it  This test is not yet approved or cleared by the [place] FDA and has been authorized for detection and/or diagnosis of SARS-CoV-2 by FDA under an Emergency Use Authorization (EUA). This EUA will remain in effect (meaning this test can be used) for the duration of the COVID-19 declaration under  Section 564(b)(1) of the Act, 21 U.S.C. section 360bbb-3(b)(1), unless the authorization is terminated or revoked.     Resp Syncytial Virus by PCR NEGATIVE NEGATIVE Final    Comment: (NOTE) Fact Sheet for Patients: BloggerCourse.com  Fact Sheet for Healthcare Providers: SeriousBroker.it  This test is not yet approved or cleared by the [place] FDA and has been authorized for detection and/or diagnosis of SARS-CoV-2 by FDA under an Emergency Use Authorization (EUA). This EUA will remain in effect (meaning this test can be used) for the duration of the COVID-19 declaration under Section 564(b)(1) of the Act, 21 U.S.C. section 360bbb-3(b)(1), unless the authorization is terminated or revoked.  Performed at [facility], [address], [place], [place] [address]          Radiology Studies: CT CHEST ABDOMEN PELVIS W CONTRAST  Result Date: 09/14/2022 CLINICAL DATA:  Sepsis, shortness of breath, fever EXAM: CT CHEST, ABDOMEN, AND PELVIS WITH CONTRAST TECHNIQUE: Multidetector CT imaging of the chest, abdomen and pelvis was performed following the standard protocol during bolus administration of intravenous contrast. RADIATION DOSE REDUCTION: This exam was performed according to the departmental dose-optimization program which includes automated exposure control, adjustment of the mA and/or kV according to patient size and/or use of iterative reconstruction technique. CONTRAST:  OMNIPAQUE IOHEXOL 300 MG/ML  SOLN COMPARISON:  Chest radiographs done today, previous CT chest done on 09/17/2020 FINDINGS: CT CHEST FINDINGS Cardiovascular: There is homogeneous enhancement in thoracic aorta. There are no intraluminal filling defects in central pulmonary artery branches. Coronary artery calcifications are seen. Dense calcification is seen in the region of mitral annulus and adjacent to the right anterior cardiac margin.  Mediastinum/Nodes: There are enlarged lymph nodes largest in the precarinal region with no significant change, possibly suggesting reactive hyperplasia. There are scattered calcifications in thyroid. Lungs/Pleura: There is 10 mm faint ground-glass density in left upper lobe with no significant change. There is new large infiltrate in left lower lobe suggesting pneumonia. Air bronchogram is seen in this new infiltrate. There is prominence of markings in the lingula, right middle lobe and right lower lobe with interval worsening. There is no pleural effusion or pneumothorax. Musculoskeletal: No acute findings are seen. CT ABDOMEN PELVIS FINDINGS Hepatobiliary: Surgical clips are seen in gallbladder fossa. No focal abnormalities are seen in liver. Pancreas: No focal abnormalities are seen. Spleen: There is 8 mm low-density in the lateral aspect of spleen with no interval change, possibly a cyst. Adrenals/Urinary Tract: Adrenals are unremarkable. There is inhomogeneous density in the upper pole of left kidney, most likely an artifact caused by motion. There is no hydronephrosis. 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 There is 2 mm calculus in the lower pole of left kidney. There are no calcific densities in the courses of the ureters. There is mild diffuse wall thickening in the urinary bladder. Stomach/Bowel:  Stomach is not distended. There is large ventral hernia involving the right flank. Part of the stomach, many of the small bowel loops and part of the colon are noted within this large hernia. There is no evidence of obstruction or incarceration. Portions of bowel loops are not included in the images limiting evaluation. Scattered diverticula are seen in colon without signs of focal acute diverticulitis. Appendix is not visualized. There is no wall thickening in the visualized small bowel loops and colon. Vascular/Lymphatic: Scattered arterial calcifications are seen. Reproductive: Uterus is not seen. Other:  There is no ascites or pneumoperitoneum. Musculoskeletal: No acute findings are seen. Degenerative changes are noted in cervical, thoracic and lumbar spine. There is mild anterolisthesis at L4-L5 level. There is spinal stenosis and encroachment of neural foramina at multiple levels. IMPRESSION: There is new large infiltrate in left lower lobe suggesting pneumonia. There is interval subtle increase in interstitial markings in the lingula, lateral segment of right middle lobe and in right lower lobe suggesting possible interstitial pneumonia. Part of this finding may suggest underlying scarring. There is no pleural effusion or pneumothorax. There is 1 cm faint ground-glass density in left upper lobe which has not changed significantly. Coronary artery calcifications are seen. There are enlarged lymph nodes in mediastinum with no significant interval change suggesting possible reactive hyperplasia. There is large ventral hernia in right flank containing much of the bowel loops. Some of the bowel loops in the lateral aspect of the hernia are not included in the images limiting evaluation. Scattered diverticula are seen in the colon without signs of focal diverticulitis. There is no evidence of intestinal obstruction or pneumoperitoneum. There is no hydronephrosis. There is small 2 mm calculus in the left kidney. Motion artifacts limit evaluation of upper pole of left kidney. There is mild diffuse wall thickening in the urinary bladder which may be due to incomplete distention or cystitis. Possible 8 mm cyst in the spleen. Lumbar spondylosis with spinal stenosis and encroachment of neural foramina at multiple levels. There is mild anterolisthesis at the L4-L5 level. Electronically Signed   By: Ernie Avena M.D.   On: 09/14/2022 20:41   DG Chest Port 1 View  Result Date: 09/14/2022 CLINICAL DATA:  Shortness of breath and fever EXAM: PORTABLE CHEST 1 VIEW COMPARISON:  Radiographs 04/14/2022 FINDINGS: Stable  cardiomediastinal silhouette. Aortic atherosclerotic calcification. Chronic perihilar and peribronchial interstitial thickening, improved from 04/14/2022. Bibasilar atelectasis. No pleural effusion or pneumothorax. No displaced rib fracture. IMPRESSION: Decreased chronic bronchitic and interstitial changes compared with 04/14/2022. No acute abnormality. Electronically Signed   By: Minerva Fester M.D.   On: 09/14/2022 18:59        Scheduled Meds:  enoxaparin (LOVENOX) injection  40 mg Subcutaneous Q24H   fluticasone furoate-vilanterol  1 puff Inhalation Daily   And   umeclidinium bromide  1 puff Inhalation Daily   gabapentin  600 mg Oral QHS   insulin aspart  0-5 Units Subcutaneous QHS   insulin aspart  0-6 Units Subcutaneous TID WC   insulin glargine-yfgn  10 Units Subcutaneous QHS   montelukast  10 mg Oral QHS   pantoprazole  40 mg Oral Daily   sertraline  50 mg Oral Daily   simvastatin  40 mg Oral Q supper   sodium chloride flush  3 mL Intravenous Q12H   Continuous Infusions:  sodium chloride 10 mL/hr at 09/15/22 0514   azithromycin 500 mg (09/15/22 0525)   cefTRIAXone (ROCEPHIN)  IV 2 g (09/15/22 0703)  LOS: 0 days    Time spent: 35 minutes    Brady Plant A Canon Gola, MD [facility]   If 7PM-7AM, please contact night-coverage [email or web address]  09/15/2022, 7:32 AM

## 2022-09-15 NOTE — H&P (Signed)
History and Physical    [name] ZOX:[id number] DOB: 1950-05-02 DOA: 09/14/2022  PCP: Carylon Perches, MD   Patient coming from: Home   Chief Complaint: SOB, fever   HPI: [name] is a pleasant 72 y.o. female with medical history significant for COPD, OSA on CPAP, rheumatoid arthritis, hypertension, insulin-dependent diabetes mellitus, and BMI 48 who presents to the ED with shortness of breath and fever.  Patient reports that she was having an uneventful day and was in her usual health until this afternoon when she developed acute onset of shortness of breath.  She found her temperature to be 102 F at home.  She took Tylenol, continued to feel worse, and presented to the ED.  She has also had persistent dysuria despite recent treatment of a UTI.  She denies flank pain.  [facility] ED Course: Upon arrival to the ED, patient is found to be febrile to 39.4 C and saturating 88% on room air while at rest with elevated respiratory rate, elevated heart rate, and systolic blood pressure as low as 83.  Labs were most notable for WBC 12,700, globin 16.1, and lactic acid 2.8.  CT demonstrates new large left lower lobe infiltrate and mild diffuse urinary bladder wall thickening.  Urine cultures were collected in the ED, 3.3 L of IV fluids were administered, and the patient was treated with acetaminophen, vancomycin, cefepime, and Flagyl.  She was transferred to [facility] for admission.  Review of Systems:  All other systems reviewed and apart from HPI, are negative.  Past Medical History:  Diagnosis Date   Asthma    COPD (chronic obstructive pulmonary disease) (HCC)    Depression    DM type 1 (diabetes mellitus, type 1) (HCC)    GERD (gastroesophageal reflux disease)    Hyperlipidemia    Hypertension    Morbid obesity (HCC)    Obstructive sleep apnea syndrome    Osteoarthritis of right knee 04/2010   end-stage   PONV (postoperative nausea and vomiting)    Rheumatoid  arthritis(714.0)    on methotrexate therapy   Septic arthritis of knee, right (HCC) 04/2010    Past Surgical History:  Procedure Laterality Date   ABDOMINAL HYSTERECTOMY  1992   ABDOMINAL WALL MESH  REMOVAL  08/13/2009   with removal of retained stitches   AORTO-PULMONARY WINDOW REPAIR  1992   for cardiac tamponade   APPENDECTOMY  1967   BIOPSY  10/29/2018   Procedure: BIOPSY;  Surgeon: Sherrilyn Rist, MD;  Location: [facility];  Service: Gastroenterology;;   BONE BIOPSY Right 04/19/2016   Procedure: BONE BIOPSY RIGHT GREAT TOE;  Surgeon: Ferman Hamming, DPM;  Location: [facility];  Service: Podiatry;  Laterality: Right;   CHOLECYSTECTOMY  1992   laparoscopic   COLONOSCOPY  12/08/2011   Procedure: COLONOSCOPY;  Surgeon: Malissa Hippo, MD;  Location: [facility];  Service: Endoscopy;  Laterality: N/A;  830   COLONOSCOPY WITH PROPOFOL N/A 10/16/2017   Procedure: COLONOSCOPY WITH PROPOFOL;  Surgeon: Sherrilyn Rist, MD;  Location: [facility];  Service: Gastroenterology;  Laterality: N/A;   COLONOSCOPY WITH PROPOFOL N/A 10/29/2018   Procedure: COLONOSCOPY WITH PROPOFOL;  Surgeon: Sherrilyn Rist, MD;  Location: [facility];  Service: Gastroenterology;  Laterality: N/A;   ESOPHAGOGASTRODUODENOSCOPY (EGD) WITH PROPOFOL N/A 10/29/2018   Procedure: ESOPHAGOGASTRODUODENOSCOPY (EGD) WITH PROPOFOL;  Surgeon: Sherrilyn Rist, MD;  Location: [facility];  Service: Gastroenterology;  Laterality: N/A;   FOREIGN BODY  REMOVAL ABDOMINAL  01/04/2010   stitch abscesses   HERNIA REPAIR  2001   open LOA / VWH repair w mesh.  Dr. Lovell Sheehan   HERNIA REPAIR  2007   open redo LOA / VWH repair w mesh.  Dr. Lovell Sheehan   POLYPECTOMY  10/16/2017   Procedure: POLYPECTOMY;  Surgeon: Sherrilyn Rist, MD;  Location: [facility];  Service: Gastroenterology;;   POLYPECTOMY  10/29/2018   Procedure: POLYPECTOMY;  Surgeon: Sherrilyn Rist, MD;  Location: [facility];  Service: Gastroenterology;;   REVISION  TOTAL KNEE ARTHROPLASTY  06/09/2010   I and D    TOTAL KNEE ARTHROPLASTY  05/07/2010   right knee    Social History:   reports that she quit smoking about 30 years ago. Her smoking use included cigarettes. She has a 50.00 pack-year smoking history. She has never used smokeless tobacco. She reports that she does not drink alcohol and does not use drugs.  Allergies  Allergen Reactions   Demerol Nausea And Vomiting    Severe   Hydromorphone Hcl Itching    All over the body.   Tetracycline Hives    Family History  Adopted: Yes  Problem Relation Age of Onset   Cancer Mother    Hyperlipidemia Mother    Diabetes Mother    Thyroid disease Mother    Diabetes Father    COPD Father    Arthritis Father        RA   Hyperlipidemia Brother    Hypertension Brother      Prior to Admission medications   Medication Sig Start Date End Date Taking? Authorizing Provider  acetaminophen (TYLENOL) 325 MG tablet Take 650 mg by mouth every 6 (six) hours as needed (for pain.).    [provider]  albuterol (PROVENTIL) (2.5 MG/3ML) 0.083% nebulizer solution Take 3 mLs (2.5 mg total) by nebulization every 6 (six) hours as needed for wheezing or shortness of breath (dx: J43.9). 02/25/22   Coralyn Helling, MD  albuterol (VENTOLIN HFA) 108 (90 Base) MCG/ACT inhaler USE 2 INHALATIONS EVERY 6 HOURS AS NEEDED FOR WHEEZING OR SHORTNESS OF BREATH 09/06/22   Coralyn Helling, MD  ALPRAZolam Prudy Feeler) 0.5 MG tablet Take 0.5 mg by mouth 3 (three) times daily as needed for sleep or anxiety.     [provider]  BENICAR 20 MG tablet Take 20 mg by mouth daily.  06/27/17   [provider]  carboxymethylcellulose (LUBRICANT EYE DROPS) 0.5 % SOLN 1 drop 3 (three) times daily as needed.    [provider]  COVID-19 mRNA bivalent vaccine, Pfizer, (PFIZER COVID-19 VAC BIVALENT) injection Inject into the muscle. 09/16/21   Judyann Munson, MD  diclofenac sodium (VOLTAREN) 1 % GEL Apply 2-4 g topically  4 (four) times daily as needed for pain. 08/13/18   [provider]  empagliflozin (JARDIANCE) 25 MG TABS tablet Take 25 mg by mouth daily.    [provider]  Ferrous Sulfate (IRON PO) Take by mouth.    [provider]  fluticasone (FLONASE) 50 MCG/ACT nasal spray Place 1 spray into both nostrils daily. 10/19/21   Coralyn Helling, MD  folic acid (FOLVITE) 1 MG tablet Take 1 mg by mouth daily. 09/01/21   [provider]  furosemide (LASIX) 40 MG tablet Take 40 mg by mouth daily as needed for fluid. Fluid    [provider]  gabapentin (NEURONTIN) 600 MG tablet Take 600 mg by mouth at bedtime.    [provider]  HUMALOG KWIKPEN 100 UNIT/ML KwikPen Inject 8-14 Units into the skin See admin instructions. Inject 8 units subcutaneously in the morning & inject 14 units subcutaneously at supper 08/13/18   [provider]  InFLIXimab (REMICADE IV) Inject 1 Dose into the vein every 6 (six) weeks.     [provider]  LANTUS SOLOSTAR 100 UNIT/ML Solostar Pen Inject into the skin 2 (two) times a day. Inject 50 units under the skin in the morning and 60 units in the evening. 08/29/18   [provider]  loratadine (CLARITIN) 10 MG tablet Take 10 mg by mouth daily.    [provider]  metFORMIN (GLUCOPHAGE-XR) 500 MG 24 hr tablet Take 2,000 mg by mouth daily.    [provider]  methotrexate (RHEUMATREX) 2.5 MG tablet Take 2.5 mg by mouth once a week. Caution:Chemotherapy. Protect from light.    [provider]  montelukast (SINGULAIR) 10 MG tablet TAKE 1 TABLET AT BEDTIME 02/09/22   Coralyn Helling, MD  Nystatin (GERHARDT'S BUTT CREAM) CREA Apply 1 application topically 2 (two) times daily. 10/09/20   Lazaro Arms, MD  omeprazole (PRILOSEC) 40 MG capsule TAKE 1 CAPSULE EVERY MORNING 01/10/21   Coralyn Helling, MD  sertraline (ZOLOFT) 50 MG tablet Take 50 mg by mouth daily.     [provider]  simvastatin (ZOCOR)  40 MG tablet Take 40 mg by mouth daily with supper.    [provider]  Dwyane Luo 100-62.5-25 MCG/ACT AEPB USE 1 INHALATION DAILY 02/15/22   Coralyn Helling, MD  TRULICITY 0.75 MG/0.5ML SOPN SMARTSIG:0.5 Milliliter(s) SUB-Q Once a Week 01/22/20   [provider]    Physical Exam: Vitals:   09/14/22 2300 09/15/22 0041 09/15/22 0054 09/15/22 0138  BP: (!) 97/41 98/66    Pulse: (!) 106 (!) 104    Resp: (!) 26 20    Temp:  99.5 F (37.5 C)    TempSrc:  Oral    SpO2: 95% 92%    Weight:   118.4 kg   Height:    5\' 2"  (1.575 m)     Constitutional: NAD, calm  Eyes: PERTLA, lids and conjunctivae normal ENMT: Mucous membranes are moist. Posterior pharynx clear of any exudate or lesions.   Neck: supple, no masses  Respiratory: Rhonchi bilaterally, Lt>Rt, no wheezing. No accessory muscle use.  Cardiovascular: S1 & S2 heard, regular rate and rhythm. No extremity edema.  Abdomen: No distension, no tenderness, soft. Bowel sounds active.  Musculoskeletal: no clubbing / cyanosis. No joint deformity upper and lower extremities.   Skin: no significant rashes, lesions, ulcers. Warm, dry, well-perfused. Neurologic: CN 2-12 grossly intact. Moving all extremities. Alert and oriented.  Psychiatric: Pleasant. Cooperative.    Labs and Imaging on Admission: I have personally reviewed following labs and imaging studies  CBC: Recent Labs  Lab 09/14/22 1759 09/15/22 0301  WBC 12.7* 18.8*  NEUTROABS 11.4*  --   HGB 16.1* 13.8  HCT 49.2* 42.0  MCV 90.6 92.1  PLT 142* 132*   Basic Metabolic Panel: Recent Labs  Lab 09/14/22 1759 09/15/22 0301  NA 138 134*  K 4.9 4.1  CL 101 101  CO2 27 23  GLUCOSE 102* 80  BUN 19 20  CREATININE 0.88 0.99  CALCIUM 9.8 8.1*   GFR: Estimated Creatinine Clearance: 63.7 mL/min (by C-G formula based on SCr of 0.99 mg/dL). Liver Function Tests: Recent Labs  Lab 09/14/22 1759  AST 23  ALT 24  ALKPHOS 68  BILITOT 0.8  PROT 6.9  ALBUMIN  4.1   No results for input(s): "LIPASE", "AMYLASE" in the last 168 hours. No results for input(s): "AMMONIA" in the last 168 hours. Coagulation Profile: Recent Labs  Lab 09/14/22 1759  INR 1.1   Cardiac Enzymes: No results for input(s): "CKTOTAL", "CKMB", "CKMBINDEX", "TROPONINI" in the last 168 hours. BNP (last 3 results) No results for input(s): "PROBNP" in the last 8760 hours. HbA1C: Recent Labs    09/15/22 0249  HGBA1C 6.1*   CBG: Recent Labs  Lab 09/15/22 0242  GLUCAP 97   Lipid Profile: No results for input(s): "CHOL", "HDL", "LDLCALC", "TRIG", "CHOLHDL", "LDLDIRECT" in the last 72 hours. Thyroid Function Tests: No results for input(s): "TSH", "T4TOTAL", "FREET4", "T3FREE", "THYROIDAB" in the last 72 hours. Anemia Panel: No results for input(s): "VITAMINB12", "FOLATE", "FERRITIN", "TIBC", "IRON", "RETICCTPCT" in the last 72 hours. Urine analysis:    Component Value Date/Time   COLORURINE YELLOW 09/14/2022 1833   APPEARANCEUR CLEAR 09/14/2022 1833   LABSPEC 1.012 09/14/2022 1833   PHURINE 5.0 09/14/2022 1833   GLUCOSEU 500 (A) 09/14/2022 1833   HGBUR TRACE (A) 09/14/2022 1833   BILIRUBINUR NEGATIVE 09/14/2022 1833   KETONESUR NEGATIVE 09/14/2022 1833   PROTEINUR NEGATIVE 09/14/2022 1833   UROBILINOGEN 0.2 12/31/2012 1341   NITRITE NEGATIVE 09/14/2022 1833   LEUKOCYTESUR LARGE (A) 09/14/2022 1833   Sepsis Labs: @LABRCNTIP (procalcitonin:4,lacticidven:4) ) Recent Results (from the past 240 hour(s))  Resp panel by RT-PCR (RSV, Flu A&B, Covid) Anterior Nasal Swab     Status: None   Collection Time: 09/14/22  7:20 PM   Specimen: Anterior Nasal Swab  Result Value Ref Range Status   SARS Coronavirus 2 by RT PCR NEGATIVE NEGATIVE Final    Comment: (NOTE) SARS-CoV-2 target nucleic acids are NOT DETECTED.  The SARS-CoV-2 RNA is generally detectable in upper respiratory specimens during the acute phase of infection. The lowest concentration of SARS-CoV-2 viral  copies this assay can detect is 138 copies/mL. A negative result does not preclude SARS-Cov-2 infection and should not be used as the sole basis for treatment or other patient management decisions. A negative result may occur with  improper specimen collection/handling, submission of specimen other than nasopharyngeal swab, presence of viral mutation(s) within the areas targeted by this assay, and inadequate number of viral copies(<138 copies/mL). A negative result must be combined with clinical observations, patient history, and epidemiological information. The expected result is Negative.  Fact Sheet for Patients:  BloggerCourse.com  Fact Sheet for Healthcare Providers:  SeriousBroker.it  This test is no t yet approved or cleared by the [place] FDA and  has been authorized for detection and/or diagnosis of SARS-CoV-2 by FDA under an Emergency Use Authorization (EUA). This EUA will remain  in effect (meaning this test can be used) for the duration of the COVID-19 declaration under Section 564(b)(1) of the Act, 21 U.S.C.section 360bbb-3(b)(1), unless the authorization is terminated  or revoked sooner.       Influenza A by PCR NEGATIVE NEGATIVE Final   Influenza B by PCR NEGATIVE NEGATIVE Final    Comment: (NOTE) The Xpert Xpress SARS-CoV-2/FLU/RSV plus assay is intended as an aid in the diagnosis of influenza from Nasopharyngeal swab specimens and should not be used as a sole basis for treatment. Nasal washings and aspirates are unacceptable for Xpert Xpress SARS-CoV-2/FLU/RSV testing.  Fact Sheet for Patients: BloggerCourse.com  Fact Sheet for Healthcare Providers: SeriousBroker.it  This test is not yet approved or cleared by the [place] FDA and has been  authorized for detection and/or diagnosis of SARS-CoV-2 by FDA under an Emergency Use Authorization (EUA). This  EUA will remain in effect (meaning this test can be used) for the duration of the COVID-19 declaration under Section 564(b)(1) of the Act, 21 U.S.C. section 360bbb-3(b)(1), unless the authorization is terminated or revoked.     Resp Syncytial Virus by PCR NEGATIVE NEGATIVE Final    Comment: (NOTE) Fact Sheet for Patients: BloggerCourse.com  Fact Sheet for Healthcare Providers: SeriousBroker.it  This test is not yet approved or cleared by the [place] FDA and has been authorized for detection and/or diagnosis of SARS-CoV-2 by FDA under an Emergency Use Authorization (EUA). This EUA will remain in effect (meaning this test can be used) for the duration of the COVID-19 declaration under Section 564(b)(1) of the Act, 21 U.S.C. section 360bbb-3(b)(1), unless the authorization is terminated or revoked.  Performed at [facility], [address] [place], [place] [address]      Radiological Exams on Admission: CT CHEST ABDOMEN PELVIS W CONTRAST  Result Date: 09/14/2022 CLINICAL DATA:  Sepsis, shortness of breath, fever EXAM: CT CHEST, ABDOMEN, AND PELVIS WITH CONTRAST TECHNIQUE: Multidetector CT imaging of the chest, abdomen and pelvis was performed following the standard protocol during bolus administration of intravenous contrast. RADIATION DOSE REDUCTION: This exam was performed according to the departmental dose-optimization program which includes automated exposure control, adjustment of the mA and/or kV according to patient size and/or use of iterative reconstruction technique. CONTRAST:  OMNIPAQUE IOHEXOL 300 MG/ML  SOLN COMPARISON:  Chest radiographs done today, previous CT chest done on 09/17/2020 FINDINGS: CT CHEST FINDINGS Cardiovascular: There is homogeneous enhancement in thoracic aorta. There are no intraluminal filling defects in central pulmonary artery branches. Coronary artery calcifications are  seen. Dense calcification is seen in the region of mitral annulus and adjacent to the right anterior cardiac margin. Mediastinum/Nodes: There are enlarged lymph nodes largest in the precarinal region with no significant change, possibly suggesting reactive hyperplasia. There are scattered calcifications in thyroid. Lungs/Pleura: There is 10 mm faint ground-glass density in left upper lobe with no significant change. There is new large infiltrate in left lower lobe suggesting pneumonia. Air bronchogram is seen in this new infiltrate. There is prominence of markings in the lingula, right middle lobe and right lower lobe with interval worsening. There is no pleural effusion or pneumothorax. Musculoskeletal: No acute findings are seen. CT ABDOMEN PELVIS FINDINGS Hepatobiliary: Surgical clips are seen in gallbladder fossa. No focal abnormalities are seen in liver. Pancreas: No focal abnormalities are seen. Spleen: There is 8 mm low-density in the lateral aspect of spleen with no interval change, possibly a cyst. Adrenals/Urinary Tract: Adrenals are unremarkable. There is inhomogeneous density in the upper pole of left kidney, most likely an artifact caused by motion. There is no hydronephrosis. 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 There is 2 mm calculus in the lower pole of left kidney. There are no calcific densities in the courses of the ureters. There is mild diffuse wall thickening in the urinary bladder. Stomach/Bowel: Stomach is not distended. There is large ventral hernia involving the right flank. Part of the stomach, many of the small bowel loops and part of the colon are noted within this large hernia. There is no evidence of obstruction or incarceration. Portions of bowel loops are not included in the images limiting evaluation. Scattered diverticula are seen in colon without signs of focal acute diverticulitis. Appendix is not visualized. There is no wall thickening in  the visualized small bowel loops  and colon. Vascular/Lymphatic: Scattered arterial calcifications are seen. Reproductive: Uterus is not seen. Other: There is no ascites or pneumoperitoneum. Musculoskeletal: No acute findings are seen. Degenerative changes are noted in cervical, thoracic and lumbar spine. There is mild anterolisthesis at L4-L5 level. There is spinal stenosis and encroachment of neural foramina at multiple levels. IMPRESSION: There is new large infiltrate in left lower lobe suggesting pneumonia. There is interval subtle increase in interstitial markings in the lingula, lateral segment of right middle lobe and in right lower lobe suggesting possible interstitial pneumonia. Part of this finding may suggest underlying scarring. There is no pleural effusion or pneumothorax. There is 1 cm faint ground-glass density in left upper lobe which has not changed significantly. Coronary artery calcifications are seen. There are enlarged lymph nodes in mediastinum with no significant interval change suggesting possible reactive hyperplasia. There is large ventral hernia in right flank containing much of the bowel loops. Some of the bowel loops in the lateral aspect of the hernia are not included in the images limiting evaluation. Scattered diverticula are seen in the colon without signs of focal diverticulitis. There is no evidence of intestinal obstruction or pneumoperitoneum. There is no hydronephrosis. There is small 2 mm calculus in the left kidney. Motion artifacts limit evaluation of upper pole of left kidney. There is mild diffuse wall thickening in the urinary bladder which may be due to incomplete distention or cystitis. Possible 8 mm cyst in the spleen. Lumbar spondylosis with spinal stenosis and encroachment of neural foramina at multiple levels. There is mild anterolisthesis at the L4-L5 level. Electronically Signed   By: Ernie Avena M.D.   On: 09/14/2022 20:41   DG Chest Port 1 View  Result Date: 09/14/2022 CLINICAL DATA:   Shortness of breath and fever EXAM: PORTABLE CHEST 1 VIEW COMPARISON:  Radiographs 04/14/2022 FINDINGS: Stable cardiomediastinal silhouette. Aortic atherosclerotic calcification. Chronic perihilar and peribronchial interstitial thickening, improved from 04/14/2022. Bibasilar atelectasis. No pleural effusion or pneumothorax. No displaced rib fracture. IMPRESSION: Decreased chronic bronchitic and interstitial changes compared with 04/14/2022. No acute abnormality. Electronically Signed   By: Minerva Fester M.D.   On: 09/14/2022 18:59     Assessment/Plan   1. Sepsis d/t pneumonia; acute hypoxic respiratory failure   - Treat with Rocephin and azithromycin, check strep pneumo and legionella antigens, check/trend procalcitonin, trend lactate, continue supplemental O2 as needed, follow cultures and clinical response to treatment    2. COPD; OSA  - Not in COPD exacerbation on admission  - Continue CPAP qHS, ICS-LAMA-LABA, and as-needed albuterol    3. Insulin-dependent DM  - Check CBGs, continue insulin   4. Rheumatoid arthritis  - Managed with methotrexate and Remicade     DVT prophylaxis: Lovenox  Code Status: Full  Level of Care: Level of care: Progressive Family Communication: None present  Disposition Plan:  Patient is from: home  Anticipated d/c is to: TBD Anticipated d/c date is: 09/18/22  Patient currently: Pending resolution of sepsis Consults called: none  Admission status: Inpatient     Briscoe Deutscher, MD [facility]  09/15/2022, 5:01 AM

## 2022-09-15 NOTE — Evaluation (Signed)
Clinical/Bedside Swallow Evaluation Patient Details  Name: [name] MRN: [id number] Date of Birth: [birth year]  Today's Date: 09/15/2022 Time: SLP Start Time (ACUTE ONLY): 0930 SLP Stop Time (ACUTE ONLY): 5638 SLP Time Calculation (min) (ACUTE ONLY): 8 min  Past Medical History:  Past Medical History:  Diagnosis Date   Asthma    COPD (chronic obstructive pulmonary disease) (HCC)    Depression    DM type 1 (diabetes mellitus, type 1) (HCC)    GERD (gastroesophageal reflux disease)    Hyperlipidemia    Hypertension    Morbid obesity (HCC)    Obstructive sleep apnea syndrome    Osteoarthritis of right knee 04/2010   end-stage   PONV (postoperative nausea and vomiting)    Rheumatoid arthritis(714.0)    on methotrexate therapy   Septic arthritis of knee, right (HCC) 04/2010   Past Surgical History:  Past Surgical History:  Procedure Laterality Date   ABDOMINAL HYSTERECTOMY  1992   ABDOMINAL WALL MESH  REMOVAL  08/13/2009   with removal of retained stitches   AORTO-PULMONARY WINDOW REPAIR  1992   for cardiac tamponade   APPENDECTOMY  1967   BIOPSY  10/29/2018   Procedure: BIOPSY;  Surgeon: Sherrilyn Rist, MD;  Location: [facility];  Service: Gastroenterology;;   BONE BIOPSY Right 04/19/2016   Procedure: BONE BIOPSY RIGHT GREAT TOE;  Surgeon: Ferman Hamming, DPM;  Location: [facility];  Service: Podiatry;  Laterality: Right;   CHOLECYSTECTOMY  1992   laparoscopic   COLONOSCOPY  12/08/2011   Procedure: COLONOSCOPY;  Surgeon: Malissa Hippo, MD;  Location: [facility];  Service: Endoscopy;  Laterality: N/A;  830   COLONOSCOPY WITH PROPOFOL N/A 10/16/2017   Procedure: COLONOSCOPY WITH PROPOFOL;  Surgeon: Sherrilyn Rist, MD;  Location: [facility];  Service: Gastroenterology;  Laterality: N/A;   COLONOSCOPY WITH PROPOFOL N/A 10/29/2018   Procedure: COLONOSCOPY WITH PROPOFOL;  Surgeon: Sherrilyn Rist, MD;  Location: [facility];  Service: Gastroenterology;  Laterality:  N/A;   ESOPHAGOGASTRODUODENOSCOPY (EGD) WITH PROPOFOL N/A 10/29/2018   Procedure: ESOPHAGOGASTRODUODENOSCOPY (EGD) WITH PROPOFOL;  Surgeon: Sherrilyn Rist, MD;  Location: [facility];  Service: Gastroenterology;  Laterality: N/A;   FOREIGN BODY REMOVAL ABDOMINAL  01/04/2010   stitch abscesses   HERNIA REPAIR  2001   open LOA / VWH repair w mesh.  Dr. Lovell Sheehan   HERNIA REPAIR  2007   open redo LOA / VWH repair w mesh.  Dr. Lovell Sheehan   POLYPECTOMY  10/16/2017   Procedure: POLYPECTOMY;  Surgeon: Sherrilyn Rist, MD;  Location: [facility];  Service: Gastroenterology;;   POLYPECTOMY  10/29/2018   Procedure: POLYPECTOMY;  Surgeon: Sherrilyn Rist, MD;  Location: [facility];  Service: Gastroenterology;;   REVISION TOTAL KNEE ARTHROPLASTY  06/09/2010   I and D    TOTAL KNEE ARTHROPLASTY  05/07/2010   right knee   HPI:  [name] is a pleasant 72 y.o. female with medical history significant for COPD, OSA on CPAP, rheumatoid arthritis, hypertension, insulin-dependent diabetes mellitus, and BMI 48 w ho presents to the ED with shortness of breath and fever. Has recurrent pna. CT chest.  There is 10 mm faint ground-glass density in left  upper lobe with no significant change. There is new large infiltrate  in left lower lobe suggesting pneumonia. Air bronchogram is seen in  this new infiltrate. There is prominence of markings in the lingula,  right middle lobe and right lower lobe  with interval worsening.    Assessment / Plan / Recommendation  Clinical Impression  Pt demonstrates constant coughing, before and after sips of liquids. In a 3 oz water swallow pt had more immediate coughing. Given recurrent pna recommend instrumental assessment to rule out potential aspiration/microaspiration as a contributing factor. SLP Visit Diagnosis: Dysphagia, oropharyngeal phase (R13.12)    Aspiration Risk  Mild aspiration risk    Diet Recommendation Regular;Thin liquid   Liquid Administration via:  Straw;Cup Medication Administration: Whole meds with liquid Supervision: Patient able to self feed    Other  Recommendations      Recommendations for follow up therapy are one component of a multi-disciplinary discharge planning process, led by the attending physician.  Recommendations may be updated based on patient status, additional functional criteria and insurance authorization.  Follow up Recommendations        Assistance Recommended at Discharge    Functional Status Assessment    Frequency and Duration            Prognosis        Swallow Study   General HPI: [name] is a pleasant 72 y.o. female with medical history significant for COPD, OSA on CPAP, rheumatoid arthritis, hypertension, insulin-dependent diabetes mellitus, and BMI 48 w ho presents to the ED with shortness of breath and fever. Has recurrent pna. CT chest.  There is 10 mm faint ground-glass density in left  upper lobe with no significant change. There is new large infiltrate  in left lower lobe suggesting pneumonia. Air bronchogram is seen in  this new infiltrate. There is prominence of markings in the lingula,  right middle lobe and right lower lobe with interval worsening. Type of Study: Bedside Swallow Evaluation Previous Swallow Assessment: none Diet Prior to this Study: Regular;Thin liquids (Level 0) Temperature Spikes Noted: No Respiratory Status: Nasal cannula History of Recent Intubation: No Behavior/Cognition: Alert;Cooperative;Pleasant mood Oral Cavity Assessment: Within Functional Limits Oral Care Completed by SLP: No Oral Cavity - Dentition: Adequate natural dentition Vision: Functional for self-feeding Self-Feeding Abilities: Able to feed self Patient Positioning: Upright in chair Baseline Vocal Quality: Normal Volitional Cough: Strong;Congested Volitional Swallow: Able to elicit    Oral/Motor/Sensory Function Overall Oral Motor/Sensory Function: Within functional limits   Ice Chips      Thin Liquid Thin Liquid: Impaired Presentation: Self Fed;Straw Pharyngeal  Phase Impairments: Cough - Immediate    Nectar Thick Nectar Thick Liquid: Not tested   Honey Thick Honey Thick Liquid: Not tested   Puree Puree: Not tested   Solid     Solid: Not tested      [name], [name] 09/15/2022,10:02 AM

## 2022-09-15 NOTE — Progress Notes (Signed)
PHARMACY - PHYSICIAN COMMUNICATION CRITICAL VALUE ALERT - BLOOD CULTURE IDENTIFICATION (BCID)  [name] is an 72 y.o. female who presented to [facility] on 09/14/2022 with a chief complaint of SOB/fever  Assessment:  68 YOF being treated and on antibiotics for CAP and now with 2/4 blood cultures growing GPC in chains with BCID detecting Strep PNA - presumably seeded from pulmonary source.   Name of physician (or Provider) Contacted: Regalado  Current antibiotics: Rocephin + Azithromycin  Changes to prescribed antibiotics recommended:  D/c Azithromycin, continue Rocephin 2g IV every 24 hours  Results for orders placed or performed during the hospital encounter of 09/14/22  Blood Culture ID Panel (Reflexed) (Collected: 09/14/2022  6:09 PM)  Result Value Ref Range   Enterococcus faecalis NOT DETECTED NOT DETECTED   Enterococcus Faecium NOT DETECTED NOT DETECTED   Listeria monocytogenes NOT DETECTED NOT DETECTED   Staphylococcus species NOT DETECTED NOT DETECTED   Staphylococcus aureus (BCID) NOT DETECTED NOT DETECTED   Staphylococcus epidermidis NOT DETECTED NOT DETECTED   Staphylococcus lugdunensis NOT DETECTED NOT DETECTED   Streptococcus species DETECTED (A) NOT DETECTED   Streptococcus agalactiae NOT DETECTED NOT DETECTED   Streptococcus pneumoniae DETECTED (A) NOT DETECTED   Streptococcus pyogenes NOT DETECTED NOT DETECTED   A.calcoaceticus-baumannii NOT DETECTED NOT DETECTED   Bacteroides fragilis NOT DETECTED NOT DETECTED   Enterobacterales NOT DETECTED NOT DETECTED   Enterobacter cloacae complex NOT DETECTED NOT DETECTED   Escherichia coli NOT DETECTED NOT DETECTED   Klebsiella aerogenes NOT DETECTED NOT DETECTED   Klebsiella oxytoca NOT DETECTED NOT DETECTED   Klebsiella pneumoniae NOT DETECTED NOT DETECTED   Proteus species NOT DETECTED NOT DETECTED   Salmonella species NOT DETECTED NOT DETECTED   Serratia marcescens NOT DETECTED NOT DETECTED   Haemophilus influenzae  NOT DETECTED NOT DETECTED   Neisseria meningitidis NOT DETECTED NOT DETECTED   Pseudomonas aeruginosa NOT DETECTED NOT DETECTED   Stenotrophomonas maltophilia NOT DETECTED NOT DETECTED   Candida albicans NOT DETECTED NOT DETECTED   Candida auris NOT DETECTED NOT DETECTED   Candida glabrata NOT DETECTED NOT DETECTED   Candida krusei NOT DETECTED NOT DETECTED   Candida parapsilosis NOT DETECTED NOT DETECTED   Candida tropicalis NOT DETECTED NOT DETECTED   Cryptococcus neoformans/gattii NOT DETECTED NOT DETECTED    Thank you for allowing pharmacy to be a part of this patient's care.  Georgina Pillion, PharmD, BCPS Infectious Diseases Clinical Pharmacist 09/15/2022 2:48 PM   **Pharmacist phone directory can now be found on [email or web address] ([email or web address]).  Listed under [facility].

## 2022-09-15 NOTE — Progress Notes (Signed)
New Admission Note:  Arrival Method: [facility] Mental Orientation: A&O x4 Telemetry: yes, [id number] Assessment: Completed Skin: Intact IV: right AC/left AC Pain: 4 Safety Measures: Safety Fall Prevention Plan was given, discussed. Admission: Completed 4E: Patient has been orientated to the room, unit and the staff. Family: not at bedside  Current orders have been reviewed and implemented. No new orders has been received upon arrival. Will continue to monitor the patient. Call light has been placed within reach and bed alarm has been activated.   Corinna Lines, RN

## 2022-09-16 DIAGNOSIS — J189 Pneumonia, unspecified organism: Secondary | ICD-10-CM | POA: Diagnosis not present

## 2022-09-16 DIAGNOSIS — A419 Sepsis, unspecified organism: Secondary | ICD-10-CM | POA: Diagnosis not present

## 2022-09-16 LAB — CULTURE, BLOOD (ROUTINE X 2)
Culture: NO GROWTH
Special Requests: ADEQUATE

## 2022-09-16 LAB — GLUCOSE, CAPILLARY
Glucose-Capillary: 179 mg/dL — ABNORMAL HIGH (ref 70–99)
Glucose-Capillary: 175 mg/dL — ABNORMAL HIGH (ref 70–99)
Glucose-Capillary: 155 mg/dL — ABNORMAL HIGH (ref 70–99)
Glucose-Capillary: 192 mg/dL — ABNORMAL HIGH (ref 70–99)

## 2022-09-16 LAB — BASIC METABOLIC PANEL
Anion gap: 9 (ref 5–15)
BUN: 17 mg/dL (ref 8–23)
CO2: 23 mmol/L (ref 22–32)
Calcium: 8.4 mg/dL — ABNORMAL LOW (ref 8.9–10.3)
Chloride: 103 mmol/L (ref 98–111)
Creatinine, Ser: 0.86 mg/dL (ref 0.44–1.00)
GFR, Estimated: >60 mL/min (ref >60)
Glucose, Bld: 140 mg/dL — ABNORMAL HIGH (ref 70–99)
Potassium: 3.3 mmol/L — ABNORMAL LOW (ref 3.5–5.1)
Sodium: 135 mmol/L (ref 135–145)

## 2022-09-16 LAB — CBC
HCT: 39.9 % (ref 36.0–46.0)
Hemoglobin: 12.8 g/dL (ref 12.0–15.0)
MCH: 29.4 pg (ref 26.0–34.0)
MCHC: 32.1 g/dL (ref 30.0–36.0)
MCV: 91.5 fL (ref 80.0–100.0)
Platelets: 112 10*3/uL — ABNORMAL LOW (ref 150–400)
RBC: 4.36 MIL/uL (ref 3.87–5.11)
RDW: 15 % (ref 11.5–15.5)
WBC: 12.5 10*3/uL — ABNORMAL HIGH (ref 4.0–10.5)
nRBC: 0 % (ref 0.0–0.2)

## 2022-09-16 LAB — PROCALCITONIN: Procalcitonin: 5.62 ng/mL

## 2022-09-16 MED ORDER — POTASSIUM CHLORIDE CRYS ER 20 MEQ PO TBCR
40.0000 meq | EXTENDED_RELEASE_TABLET | Freq: Once | ORAL | Status: AC
  Administered 2022-09-16: 40 meq via ORAL
  Filled 2022-09-16: qty 2

## 2022-09-16 MED ORDER — SACCHAROMYCES BOULARDII 250 MG PO CAPS
250.0000 mg | ORAL_CAPSULE | Freq: Two times a day (BID) | ORAL | Status: DC
  Administered 2022-09-16 – 2022-09-19 (×6): 250 mg via ORAL
  Filled 2022-09-16 (×7): qty 1

## 2022-09-16 NOTE — Progress Notes (Signed)
   09/16/22 2134  BiPAP/CPAP/SIPAP  BiPAP/CPAP/SIPAP Pt Type Adult  Mask Type Nasal mask  Mask Size Small  EPAP 9 cmH2O  FiO2 (%) 28 %  BiPAP/CPAP /SiPAP Vitals  SpO2 94 %  Bilateral Breath Sounds Clear;Diminished

## 2022-09-16 NOTE — TOC CM/SW Note (Signed)
Transition of Care [facility]) - Inpatient Brief Assessment   Patient Details  Name: [name] MRN: [id number] Date of Birth: 10-30-1950  Transition of Care [facility] CM/SW Contact:    Darrold Span, RN Phone Number: 09/16/2022, 3:08 PM   Clinical Narrative: Pt admitted with sepsis/PNA, Transition of Care Department [facility]) has reviewed patient and we will continue to monitor patient advancement through interdisciplinary progression rounds. If new patient transition needs arise, please place a TOC consult.   Transition of Care Asessment: Insurance and Status: Insurance coverage has been reviewed Patient has primary care physician: Yes Home environment has been reviewed: home alone Prior level of function:: independent Prior/Current Home Services: No current home services Social Determinants of Health Reivew: SDOH reviewed no interventions necessary Readmission risk has been reviewed: Yes Transition of care needs: no transition of care needs at this time

## 2022-09-16 NOTE — Plan of Care (Signed)
  Problem: Health Behavior/Discharge Planning: Goal: Ability to manage health-related needs will improve Outcome: Progressing   

## 2022-09-16 NOTE — Progress Notes (Signed)
Patient placed on cpap for with 2L bled in for sleeping. No distress noted. SAT 97% Pt resting comfortably.

## 2022-09-16 NOTE — Progress Notes (Signed)
PROGRESS NOTE    [name]  ZOX:[id number] DOB: 15-Aug-1950 DOA: 09/14/2022 PCP: Carylon Perches, MD   Brief Narrative: 72 year old with past medical history significant for COPD, OSA on CPAP, rheumatoid arthritis, hypertension, insulin-dependent diabetes, BMI 48 who presents to the ED complaining of shortness of breath and fever.  Evaluation in the ED patient was found to be febrile, hypoxic oxygen saturation 88 on room air hypotensive systolic blood pressure 83.  White blood cell 12.  Lactic acid 2.8.  CT demonstrating large left lower lobe infiltrate and mild diffuse urinary bladder wall thickening.  Patient admitted with sepsis secondary to pneumonia, acute hypoxic respiratory failure.   Assessment & Plan:   Principal Problem:   Sepsis due to pneumonia [facility]) Active Problems:   Obstructive sleep apnea   COPD (chronic obstructive pulmonary disease) with emphysema (HCC)   Insulin-requiring or dependent type II diabetes mellitus (HCC)   Rheumatoid arthritis (HCC)   1-Sepsis secondary to pneumonia: -Patient presents with fever, SOB, Cough. CT chest:  new large infiltrate in left lower lobe suggesting pneumonia. There is interval subtle increase in interstitial markings in the lingula, lateral segment of right middle lobe and in right lower lobe suggesting possible interstitial pneumonia -She report 4 episodes of PNA last year. Speech therapist consulted. No evidence of aspiration.  -MRSA PCR. Negative --Blood culture: Growing Strep Pneumoniae.  -Continue with Ceftriaxone day 2. Would treat for at least 10 days with  antibiotics.  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  Will repeat Blood culture tomorrow.   Acute hypoxic respiratory failure secondary to pneumonia: Continue with oxygen supplementation.  Continue with inhaler. Albuterol Schedule.   Continue with  Guaifenesin.  Flutter valve.    COPD, OSA: Continue with Breo.  CPAP  Insulin-dependent Diabetes type 2: Start Insulin while in the hospital.  Resume Trulicity metformin at discharge.   Rheumatoid arthritis: On methotrexate and Remicade at home.   Diarrhea; C diff negative Start Florastore.   Estimated body mass index is 47.74 kg/m as calculated from the following:   Height as of this encounter: 5\' 2"  (1.575 m).   Weight as of this encounter: 118.4 kg.   DVT prophylaxis: Lovenox Code Status: Full code Family Communication: Crae discussed with patient Disposition Plan:  Status is: Inpatient Remains inpatient appropriate because: management of sepsis.     Consultants:  None  Procedures:  none  Antimicrobials:    Subjective: She is feeling better today,. Coughing phlegm She denies chest pain.    Objective: Vitals:   09/16/22 0053 09/16/22 0416 09/16/22 0451 09/16/22 0705  BP:  116/64 117/61   Pulse: 89 86 90   Resp:  17 15   Temp:  (!) 97.4 F (36.3 C) (!) 97.5 F (36.4 C)   TempSrc:  Axillary Oral   SpO2: 92% 100% 91% 94%  Weight:      Height:        Intake/Output Summary (Last 24 hours) at 09/16/2022 0732 Last data filed at 09/16/2022 0250 Gross per 24 hour  Intake 150.74 ml  Output --  Net 150.74 ml    Filed Weights   09/14/22 1825 09/15/22 0054  Weight: 116.4 kg 118.4 kg    Examination:  General exam: NAD Respiratory system: BL ronchus.  Cardiovascular system: S 1, S 2 RRR Gastrointestinal system: BS present, soft, nt Central nervous system: Alert, oriented,  Extremities: no  edema   Data Reviewed: I have personally reviewed following labs and imaging studies  CBC: Recent Labs  Lab 09/14/22 1759 09/15/22 0301 09/16/22 0144  WBC 12.7* 18.8* 12.5*  NEUTROABS 11.4*  --   --   HGB 16.1* 13.8 12.8  HCT 49.2* 42.0 39.9  MCV 90.6 92.1 91.5  PLT 142* 132* 112*    Basic Metabolic Panel: Recent Labs  Lab  09/14/22 1759 09/15/22 0301 09/16/22 0144  NA 138 134* 135  K 4.9 4.1 3.3*  CL 101 101 103  CO2 27 23 23   GLUCOSE 102* 80 140*  BUN 19 20 17   CREATININE 0.88 0.99 0.86  CALCIUM 9.8 8.1* 8.4*    GFR: Estimated Creatinine Clearance: 73.3 mL/min (by C-G formula based on SCr of 0.86 mg/dL). Liver Function Tests: Recent Labs  Lab 09/14/22 1759  AST 23  ALT 24  ALKPHOS 68  BILITOT 0.8  PROT 6.9  ALBUMIN 4.1    No results for input(s): "LIPASE", "AMYLASE" in the last 168 hours. No results for input(s): "AMMONIA" in the last 168 hours. Coagulation Profile: Recent Labs  Lab 09/14/22 1759  INR 1.1    Cardiac Enzymes: No results for input(s): "CKTOTAL", "CKMB", "CKMBINDEX", "TROPONINI" in the last 168 hours. BNP (last 3 results) No results for input(s): "PROBNP" in the last 8760 hours. HbA1C: Recent Labs    09/15/22 0249  HGBA1C 6.1*    CBG: Recent Labs  Lab 09/15/22 0757 09/15/22 1207 09/15/22 1605 09/15/22 2303 09/16/22 0633  GLUCAP 102* 186* 106* 155* 179*    Lipid Profile: No results for input(s): "CHOL", "HDL", "LDLCALC", "TRIG", "CHOLHDL", "LDLDIRECT" in the last 72 hours. Thyroid Function Tests: No results for input(s): "TSH", "T4TOTAL", "FREET4", "T3FREE", "THYROIDAB" in the last 72 hours. Anemia Panel: No results for input(s): "VITAMINB12", "FOLATE", "FERRITIN", "TIBC", "IRON", "RETICCTPCT" in the last 72 hours. Sepsis Labs: Recent Labs  Lab 09/14/22 1759 09/14/22 2021 09/14/22 2350 09/15/22 0252 09/15/22 0301 09/16/22 0144  PROCALCITON  --   --   --  8.11  --  5.62  LATICACIDVEN 2.8* 3.3* 2.0*  --  2.0*  --      Recent Results (from the past 240 hour(s))  Culture, blood (Routine x 2)     Status: None (Preliminary result)   Collection Time: 09/14/22  6:09 PM   Specimen: BLOOD  Result Value Ref Range Status   Specimen Description   Final    BLOOD LEFT ANTECUBITAL Performed at [facility], [address]  [place], [place] [address]    Special Requests   Final    BOTTLES DRAWN AEROBIC AND ANAEROBIC Blood Culture adequate volume Performed at [facility], [address] [place], [place] [address]    Culture  Setup Time   Final    GRAM POSITIVE COCCI IN CHAINS IN BOTH AEROBIC AND ANAEROBIC BOTTLES CRITICAL RESULT CALLED TO, READ BACK BY AND VERIFIED WITH: PHARMD ELIZABETH [id number] @1430  BY SM    Culture   Final    GRAM POSITIVE COCCI IN BOTH AEROBIC AND ANAEROBIC BOTTLES Performed at [facility], [address], [place], [place] [address]    Report Status PENDING  Incomplete  Blood Culture ID Panel (Reflexed)     Status: Abnormal   Collection Time: 09/14/22  6:09 PM  Result Value Ref Range Status   Enterococcus faecalis NOT DETECTED NOT DETECTED Final   Enterococcus Faecium NOT DETECTED NOT DETECTED Final   Listeria monocytogenes NOT DETECTED NOT DETECTED Final  Staphylococcus species NOT DETECTED NOT DETECTED Final   Staphylococcus aureus (BCID) NOT DETECTED NOT DETECTED Final   Staphylococcus epidermidis NOT DETECTED NOT DETECTED Final   Staphylococcus lugdunensis NOT DETECTED NOT DETECTED Final   Streptococcus species DETECTED (A) NOT DETECTED Final    Comment: CRITICAL RESULT CALLED TO, READ BACK BY AND VERIFIED WITH: PHARMD ELIZABETH [id number] @1430  BY SM    Streptococcus agalactiae NOT DETECTED NOT DETECTED Final   Streptococcus pneumoniae DETECTED (A) NOT DETECTED Final    Comment: CRITICAL RESULT CALLED TO, READ BACK BY AND VERIFIED WITH: PHARMD ELIZABETH [id number] @1430  BY SM    Streptococcus pyogenes NOT DETECTED NOT DETECTED Final   A.calcoaceticus-baumannii NOT DETECTED NOT DETECTED Final   Bacteroides fragilis NOT DETECTED NOT DETECTED Final   Enterobacterales NOT DETECTED NOT DETECTED Final   Enterobacter cloacae complex NOT DETECTED NOT DETECTED Final   Escherichia coli NOT DETECTED NOT DETECTED Final   Klebsiella aerogenes NOT DETECTED NOT DETECTED  Final   Klebsiella oxytoca NOT DETECTED NOT DETECTED Final   Klebsiella pneumoniae NOT DETECTED NOT DETECTED Final   Proteus species NOT DETECTED NOT DETECTED Final   Salmonella species NOT DETECTED NOT DETECTED Final   Serratia marcescens NOT DETECTED NOT DETECTED Final   Haemophilus influenzae NOT DETECTED NOT DETECTED Final   Neisseria meningitidis NOT DETECTED NOT DETECTED Final   Pseudomonas aeruginosa NOT DETECTED NOT DETECTED Final   Stenotrophomonas maltophilia NOT DETECTED NOT DETECTED Final   Candida albicans NOT DETECTED NOT DETECTED Final   Candida auris NOT DETECTED NOT DETECTED Final   Candida glabrata NOT DETECTED NOT DETECTED Final   Candida krusei NOT DETECTED NOT DETECTED Final   Candida parapsilosis NOT DETECTED NOT DETECTED Final   Candida tropicalis NOT DETECTED NOT DETECTED Final   Cryptococcus neoformans/gattii NOT DETECTED NOT DETECTED Final    Comment: Performed at [facility], [address] [place], [place] [address]  Urine Culture     Status: None   Collection Time: 09/14/22  6:33 PM   Specimen: Urine, Random  Result Value Ref Range Status   Specimen Description   Final    URINE, RANDOM Performed at [facility], [address], [place], [place] [address]    Special Requests   Final    NONE Reflexed from [id number] Performed at [facility], [address], [place], [place] [address]    Culture   Final    NO GROWTH Performed at [facility], [address], [place], [place] [address]    Report Status 09/15/2022 FINAL  Final  Resp panel by RT-PCR (RSV, Flu A&B, Covid) Anterior Nasal Swab     Status: None   Collection Time: 09/14/22  7:20 PM   Specimen: Anterior Nasal Swab  Result Value Ref Range Status   SARS Coronavirus 2 by RT PCR NEGATIVE NEGATIVE Final    Comment: (NOTE) SARS-CoV-2 target nucleic acids are NOT DETECTED.  The SARS-CoV-2 RNA is generally detectable in upper respiratory specimens  during the acute phase of infection. The lowest concentration of SARS-CoV-2 viral copies this assay can detect is 138 copies/mL. A negative result does not preclude SARS-Cov-2 infection and should not be used as the sole basis for treatment or other patient management decisions. A negative result may occur with  improper specimen collection/handling, submission of specimen other than nasopharyngeal swab, presence of viral mutation(s) within the areas targeted by this assay, and inadequate number of viral copies(<138 copies/mL). A negative result must be combined with  clinical observations, patient history, and epidemiological information. The expected result is Negative.  Fact Sheet for Patients:  BloggerCourse.com  Fact Sheet for Healthcare Providers:  SeriousBroker.it  This test is no t yet approved or cleared by the [place] FDA and  has been authorized for detection and/or diagnosis of SARS-CoV-2 by FDA under an Emergency Use Authorization (EUA). This EUA will remain  in effect (meaning this test can be used) for the duration of the COVID-19 declaration under Section 564(b)(1) of the Act, 21 U.S.C.section 360bbb-3(b)(1), unless the authorization is terminated  or revoked sooner.       Influenza A by PCR NEGATIVE NEGATIVE Final   Influenza B by PCR NEGATIVE NEGATIVE Final    Comment: (NOTE) The Xpert Xpress SARS-CoV-2/FLU/RSV plus assay is intended as an aid in the diagnosis of influenza from Nasopharyngeal swab specimens and should not be used as a sole basis for treatment. Nasal washings and aspirates are unacceptable for Xpert Xpress SARS-CoV-2/FLU/RSV testing.  Fact Sheet for Patients: BloggerCourse.com  Fact Sheet for Healthcare Providers: SeriousBroker.it  This test is not yet approved or cleared by the [place] FDA and has been authorized for detection  and/or diagnosis of SARS-CoV-2 by FDA under an Emergency Use Authorization (EUA). This EUA will remain in effect (meaning this test can be used) for the duration of the COVID-19 declaration under Section 564(b)(1) of the Act, 21 U.S.C. section 360bbb-3(b)(1), unless the authorization is terminated or revoked.     Resp Syncytial Virus by PCR NEGATIVE NEGATIVE Final    Comment: (NOTE) Fact Sheet for Patients: BloggerCourse.com  Fact Sheet for Healthcare Providers: SeriousBroker.it  This test is not yet approved or cleared by the [place] FDA and has been authorized for detection and/or diagnosis of SARS-CoV-2 by FDA under an Emergency Use Authorization (EUA). This EUA will remain in effect (meaning this test can be used) for the duration of the COVID-19 declaration under Section 564(b)(1) of the Act, 21 U.S.C. section 360bbb-3(b)(1), unless the authorization is terminated or revoked.  Performed at [facility], [address], [place], [place] [address]   Culture, blood (Routine x 2)     Status: None (Preliminary result)   Collection Time: 09/15/22  2:49 AM   Specimen: BLOOD  Result Value Ref Range Status   Specimen Description BLOOD BLOOD LEFT ARM  Final   Special Requests   Final    BOTTLES DRAWN AEROBIC AND ANAEROBIC Blood Culture adequate volume   Culture   Final    NO GROWTH < 12 HOURS Performed at [facility], [address] [place], [place] [address]    Report Status PENDING  Incomplete  MRSA Next Gen by PCR, Nasal     Status: None   Collection Time: 09/15/22  8:59 AM   Specimen: Nasal Mucosa; Nasal Swab  Result Value Ref Range Status   MRSA by PCR Next Gen NOT DETECTED NOT DETECTED Final    Comment: (NOTE) The GeneXpert MRSA Assay (FDA approved for NASAL specimens only), is one component of a comprehensive MRSA colonization surveillance program. It is not intended to diagnose MRSA  infection nor to guide or monitor treatment for MRSA infections. Test performance is not FDA approved in patients less than 53 years old. Performed at [facility], [address] [place], [place] [address]   C Difficile Quick Screen w PCR reflex     Status: None   Collection Time: 09/15/22  7:29 PM   Specimen: STOOL  Result Value Ref Range Status   C Diff antigen NEGATIVE NEGATIVE Final   C Diff toxin NEGATIVE NEGATIVE Final   C Diff interpretation No C. difficile detected.  Final    Comment: Performed at [facility], [address], [place], [place] [address]         Radiology Studies: DG Swallowing Func-Speech Pathology  Result Date: 09/15/2022 Table formatting from the original result was not included. Modified Barium Swallow Study Patient Details Name: [name] MRN: [id number] Date of Birth: [birth year] Today's Date: 09/15/2022 HPI/PMH: HPI: PETRIA SEAMON is a pleasant 72 y.o. female with medical history significant for COPD, OSA on CPAP, rheumatoid arthritis, hypertension, insulin-dependent diabetes mellitus, and BMI 48 w ho presents to the ED with shortness of breath and fever. Has recurrent pna. CT chest.  There is 10 mm faint ground-glass density in left  upper lobe with no significant change. There is new large infiltrate  in left lower lobe suggesting pneumonia. Air bronchogram is seen in  this new infiltrate. There is prominence of markings in the lingula,  right middle lobe and right lower lobe with interval worsening. Clinical Impression: Clinical Impression: Pt demosntrates a slight delay in swallow initaition with thin and nectar thick liquids penetrating the vestibule with immediate ejection during the height of the swallow PAS 2/flash penetration. No aspiration, no residue. Risk of silent/microaspiration very low. Pt almost always coughs after a swallow though airway clear. Highly sensate. Recommend pt continue regular diet and thin liquids. No SLP f/u needed for  dysphagia. Factors that may increase risk of adverse event in presence of aspiration Rubye Oaks & Clearance Coots 2021): Factors that may increase risk of adverse event in presence of aspiration Rubye Oaks & Clearance Coots 2021): Poor general health and/or compromised immunity Recommendations/Plan: Swallowing Evaluation Recommendations Swallowing Evaluation Recommendations Recommendations: PO diet PO Diet Recommendation: Regular; Thin liquids (Level 0) Liquid Administration via: Cup; Straw Medication Administration: Whole meds with liquid Supervision: Patient able to self-feed Postural changes: Position pt fully upright for meals Oral care recommendations: Oral care BID (2x/day) Treatment Plan Treatment Plan Treatment recommendations: No treatment recommended at this time Treatment frequency: Min 2x/week Treatment duration: 2 weeks Recommendations Recommendations for follow up therapy are one component of a multi-disciplinary discharge planning process, led by the attending physician.  Recommendations may be updated based on patient status, additional functional criteria and insurance authorization. Assessment: Orofacial Exam: Orofacial Exam Oral Cavity - Dentition: Adequate natural dentition Anatomy: Anatomy: WFL Boluses Administered: Boluses Administered Boluses Administered: Thin liquids (Level 0); Mildly thick liquids (Level 2, nectar thick); Puree; Solid  Oral Impairment Domain: Oral Impairment Domain Lip Closure: No labial escape Tongue control during bolus hold: Cohesive bolus between tongue to palatal seal Bolus preparation/mastication: Timely and efficient chewing and mashing Bolus transport/lingual motion: Brisk tongue motion Oral residue: Complete oral clearance Initiation of pharyngeal swallow : Pyriform sinuses  Pharyngeal Impairment Domain: Pharyngeal Impairment Domain Laryngeal elevation: Complete superior movement of thyroid cartilage with complete approximation of arytenoids to epiglottic petiole Anterior hyoid  excursion: Complete anterior movement Epiglottic movement: Complete inversion Laryngeal vestibule closure: Complete, no air/contrast in laryngeal vestibule Pharyngeal stripping wave : Present - complete Pharyngeal contraction (A/P view only): Complete Pharyngoesophageal segment opening: Complete distension and complete duration, no obstruction of flow Tongue base retraction: No contrast between tongue base and posterior pharyngeal wall (PPW) Pharyngeal residue: Complete pharyngeal clearance  Esophageal Impairment Domain: Esophageal Impairment Domain Esophageal clearance upright position: Esophageal retention (pill hesitated mid esophagus, but cleared. Pt reported  globus) Pill: Esophageal Impairment Domain Esophageal clearance upright position: Esophageal retention (pill hesitated mid esophagus, but cleared. Pt reported globus) Penetration/Aspiration Scale Score: Penetration/Aspiration Scale Score 1.  Material does not enter airway: Puree; Solid 2.  Material enters airway, remains ABOVE vocal cords then ejected out: Thin liquids (Level 0); Mildly thick liquids (Level 2, nectar thick) Compensatory Strategies: Compensatory Strategies Compensatory strategies: No   General Information: Caregiver present: No  Diet Prior to this Study: Regular; Thin liquids (Level 0)   Temperature : Normal   No data recorded  Supplemental O2: Nasal cannula   No data recorded Behavior/Cognition: Alert; Cooperative; Pleasant mood Self-Feeding Abilities: Able to self-feed Baseline vocal quality/speech: Normal Volitional Cough: Able to elicit Volitional Swallow: Able to elicit No data recorded Goal Planning: No data recorded No data recorded No data recorded No data recorded No data recorded Pain: No data recorded End of Session: Start Time:SLP Start Time (ACUTE ONLY): 1223 Stop Time: SLP Stop Time (ACUTE ONLY): 1240 Time Calculation:SLP Time Calculation (min) (ACUTE ONLY): 17 min Charges: SLP Evaluations $ SLP Speech Visit: 1 Visit SLP  Evaluations $BSS Swallow: 1 Procedure $MBS Swallow: 1 Procedure SLP visit diagnosis: SLP Visit Diagnosis: Dysphagia, unspecified (R13.10) Past Medical History: Past Medical History: Diagnosis Date  Asthma   COPD (chronic obstructive pulmonary disease) (HCC)   Depression   DM type 1 (diabetes mellitus, type 1) (HCC)   GERD (gastroesophageal reflux disease)   Hyperlipidemia   Hypertension   Morbid obesity (HCC)   Obstructive sleep apnea syndrome   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  PONV (postoperative nausea and vomiting)   Rheumatoid arthritis(714.0)   on methotrexate therapy  Septic arthritis of knee, right (HCC) 04/2010 Past Surgical History: Past Surgical History: Procedure Laterality Date  ABDOMINAL HYSTERECTOMY  1992  ABDOMINAL WALL MESH  REMOVAL  08/13/2009  with removal of retained stitches  AORTO-PULMONARY WINDOW REPAIR  1992  for cardiac tamponade  APPENDECTOMY  1967  BIOPSY  10/29/2018  Procedure: BIOPSY;  Surgeon: Sherrilyn Rist, MD;  Location: [facility];  Service: Gastroenterology;;  BONE BIOPSY Right 04/19/2016  Procedure: BONE BIOPSY RIGHT GREAT TOE;  Surgeon: Ferman Hamming, DPM;  Location: [facility];  Service: Podiatry;  Laterality: Right;  CHOLECYSTECTOMY  1992  laparoscopic  COLONOSCOPY  12/08/2011  Procedure: COLONOSCOPY;  Surgeon: Malissa Hippo, MD;  Location: [facility];  Service: Endoscopy;  Laterality: N/A;  830  COLONOSCOPY WITH PROPOFOL N/A 10/16/2017  Procedure: COLONOSCOPY WITH PROPOFOL;  Surgeon: Sherrilyn Rist, MD;  Location: [facility];  Service: Gastroenterology;  Laterality: N/A;  COLONOSCOPY WITH PROPOFOL N/A 10/29/2018  Procedure: COLONOSCOPY WITH PROPOFOL;  Surgeon: Sherrilyn Rist, MD;  Location: [facility];  Service: Gastroenterology;  Laterality: N/A;  ESOPHAGOGASTRODUODENOSCOPY (EGD) WITH PROPOFOL N/A 10/29/2018  Procedure: ESOPHAGOGASTRODUODENOSCOPY (EGD) WITH PROPOFOL;  Surgeon: Sherrilyn Rist, MD;  Location: [facility];  Service:  Gastroenterology;  Laterality: N/A;  FOREIGN BODY REMOVAL ABDOMINAL  01/04/2010  stitch abscesses  HERNIA REPAIR  2001  open LOA / VWH repair w mesh.  Dr. Lovell Sheehan  HERNIA REPAIR  2007  open redo LOA / VWH repair w mesh.  Dr. Lovell Sheehan  POLYPECTOMY  10/16/2017  Procedure: POLYPECTOMY;  Surgeon: Sherrilyn Rist, MD;  Location: [facility];  Service: Gastroenterology;;  POLYPECTOMY  10/29/2018  Procedure: POLYPECTOMY;  Surgeon: Sherrilyn Rist, MD;  Location: [facility];  Service: Gastroenterology;;  REVISION TOTAL KNEE ARTHROPLASTY  06/09/2010  I and D  TOTAL KNEE ARTHROPLASTY  05/07/2010  right knee DeBlois, Riley Nearing 09/15/2022, 12:44 PM  CT CHEST ABDOMEN PELVIS W CONTRAST  Result Date: 09/14/2022 CLINICAL DATA:  Sepsis, shortness of breath, fever EXAM: CT CHEST, ABDOMEN, AND PELVIS WITH CONTRAST TECHNIQUE: Multidetector CT imaging of the chest, abdomen and pelvis was performed following the standard protocol during bolus administration of intravenous contrast. RADIATION DOSE REDUCTION: This exam was performed according to the departmental dose-optimization program which includes automated exposure control, adjustment of the mA and/or kV according to patient size and/or use of iterative reconstruction technique. CONTRAST:  OMNIPAQUE IOHEXOL 300 MG/ML  SOLN COMPARISON:  Chest radiographs done today, previous CT chest done on 09/17/2020 FINDINGS: CT CHEST FINDINGS Cardiovascular: There is homogeneous enhancement in thoracic aorta. There are no intraluminal filling defects in central pulmonary artery branches. Coronary artery calcifications are seen. Dense calcification is seen in the region of mitral annulus and adjacent to the right anterior cardiac margin. Mediastinum/Nodes: There are enlarged lymph nodes largest in the precarinal region with no significant change, possibly suggesting reactive hyperplasia. There are scattered calcifications in thyroid. Lungs/Pleura: There is 10 mm faint ground-glass  density in left upper lobe with no significant change. There is new large infiltrate in left lower lobe suggesting pneumonia. Air bronchogram is seen in this new infiltrate. There is prominence of markings in the lingula, right middle lobe and right lower lobe with interval worsening. There is no pleural effusion or pneumothorax. Musculoskeletal: No acute findings are seen. CT ABDOMEN PELVIS FINDINGS Hepatobiliary: Surgical clips are seen in gallbladder fossa. No focal abnormalities are seen in liver. Pancreas: No focal abnormalities are seen. Spleen: There is 8 mm low-density in the lateral aspect of spleen with no interval change, possibly a cyst. Adrenals/Urinary Tract: Adrenals are unremarkable. There is inhomogeneous density in the upper pole of left kidney, most likely an artifact caused by motion. There is no hydronephrosis. 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 There is 2 mm calculus in the lower pole of left kidney. There are no calcific densities in the courses of the ureters. There is mild diffuse wall thickening in the urinary bladder. Stomach/Bowel: Stomach is not distended. There is large ventral hernia involving the right flank. Part of the stomach, many of the small bowel loops and part of the colon are noted within this large hernia. There is no evidence of obstruction or incarceration. Portions of bowel loops are not included in the images limiting evaluation. Scattered diverticula are seen in colon without signs of focal acute diverticulitis. Appendix is not visualized. There is no wall thickening in the visualized small bowel loops and colon. Vascular/Lymphatic: Scattered arterial calcifications are seen. Reproductive: Uterus is not seen. Other: There is no ascites or pneumoperitoneum. Musculoskeletal: No acute findings are seen. Degenerative changes are noted in cervical, thoracic and lumbar spine. There is mild anterolisthesis at L4-L5 level. There is spinal stenosis and encroachment of  neural foramina at multiple levels. IMPRESSION: There is new large infiltrate in left lower lobe suggesting pneumonia. There is interval subtle increase in interstitial markings in the lingula, lateral segment of right middle lobe and in right lower lobe suggesting possible interstitial pneumonia. Part of this finding may suggest underlying scarring. There is no pleural effusion or pneumothorax. There is 1 cm faint ground-glass density in left upper lobe which has not changed significantly. Coronary artery calcifications are seen. There are enlarged lymph nodes in mediastinum with no significant interval change suggesting possible reactive hyperplasia. There is large ventral hernia  in right flank containing much of the bowel loops. Some of the bowel loops in the lateral aspect of the hernia are not included in the images limiting evaluation. Scattered diverticula are seen in the colon without signs of focal diverticulitis. There is no evidence of intestinal obstruction or pneumoperitoneum. There is no hydronephrosis. There is small 2 mm calculus in the left kidney. Motion artifacts limit evaluation of upper pole of left kidney. There is mild diffuse wall thickening in the urinary bladder which may be due to incomplete distention or cystitis. Possible 8 mm cyst in the spleen. Lumbar spondylosis with spinal stenosis and encroachment of neural foramina at multiple levels. There is mild anterolisthesis at the L4-L5 level. Electronically Signed   By: Ernie Avena M.D.   On: 09/14/2022 20:41   DG Chest Port 1 View  Result Date: 09/14/2022 CLINICAL DATA:  Shortness of breath and fever EXAM: PORTABLE CHEST 1 VIEW COMPARISON:  Radiographs 04/14/2022 FINDINGS: Stable cardiomediastinal silhouette. Aortic atherosclerotic calcification. Chronic perihilar and peribronchial interstitial thickening, improved from 04/14/2022. Bibasilar atelectasis. No pleural effusion or pneumothorax. No displaced rib fracture. IMPRESSION:  Decreased chronic bronchitic and interstitial changes compared with 04/14/2022. No acute abnormality. Electronically Signed   By: Minerva Fester M.D.   On: 09/14/2022 18:59        Scheduled Meds:  albuterol  2.5 mg Nebulization Q6H   enoxaparin (LOVENOX) injection  60 mg Subcutaneous Q24H   fluticasone furoate-vilanterol  1 puff Inhalation Daily   And   umeclidinium bromide  1 puff Inhalation Daily   gabapentin  600 mg Oral QHS   guaiFENesin  600 mg Oral BID   insulin aspart  0-5 Units Subcutaneous QHS   insulin aspart  0-6 Units Subcutaneous TID WC   insulin glargine-yfgn  10 Units Subcutaneous QHS   montelukast  10 mg Oral QHS   pantoprazole  40 mg Oral Daily   potassium chloride  40 mEq Oral Once   sertraline  50 mg Oral Daily   silver sulfADIAZINE   Topical Daily   simvastatin  40 mg Oral Q supper   sodium chloride flush  3 mL Intravenous Q12H   Continuous Infusions:  sodium chloride Stopped (09/15/22 1000)   cefTRIAXone (ROCEPHIN)  IV       LOS: 1 day    Time spent: 35 minutes    Marty Uy A Argie Applegate, MD [facility]   If 7PM-7AM, please contact night-coverage [email or web address]  09/16/2022, 7:32 AM

## 2022-09-16 NOTE — Progress Notes (Signed)
Patient placed on CPAP via nasal mask with a setting of 9 cmH2O(per patient home regimen) with 2 L O2 bleed in.  Patient is tolerating at this time.

## 2022-09-17 DIAGNOSIS — A419 Sepsis, unspecified organism: Secondary | ICD-10-CM | POA: Diagnosis not present

## 2022-09-17 DIAGNOSIS — J189 Pneumonia, unspecified organism: Secondary | ICD-10-CM | POA: Diagnosis not present

## 2022-09-17 LAB — GLUCOSE, CAPILLARY
Glucose-Capillary: 199 mg/dL — ABNORMAL HIGH (ref 70–99)
Glucose-Capillary: 238 mg/dL — ABNORMAL HIGH (ref 70–99)
Glucose-Capillary: 196 mg/dL — ABNORMAL HIGH (ref 70–99)
Glucose-Capillary: 253 mg/dL — ABNORMAL HIGH (ref 70–99)

## 2022-09-17 LAB — CBC
HCT: 43.9 % (ref 36.0–46.0)
Hemoglobin: 14.2 g/dL (ref 12.0–15.0)
MCH: 29.8 pg (ref 26.0–34.0)
MCHC: 32.3 g/dL (ref 30.0–36.0)
MCV: 92.2 fL (ref 80.0–100.0)
Platelets: 110 10*3/uL — ABNORMAL LOW (ref 150–400)
RBC: 4.76 MIL/uL (ref 3.87–5.11)
RDW: 14.9 % (ref 11.5–15.5)
WBC: 8.5 10*3/uL (ref 4.0–10.5)
nRBC: 0 % (ref 0.0–0.2)

## 2022-09-17 LAB — CULTURE, BLOOD (ROUTINE X 2)

## 2022-09-17 LAB — BASIC METABOLIC PANEL
Anion gap: 11 (ref 5–15)
BUN: 17 mg/dL (ref 8–23)
CO2: 21 mmol/L — ABNORMAL LOW (ref 22–32)
Calcium: 9 mg/dL (ref 8.9–10.3)
Chloride: 103 mmol/L (ref 98–111)
Creatinine, Ser: 0.76 mg/dL (ref 0.44–1.00)
GFR, Estimated: >60 mL/min (ref >60)
Glucose, Bld: 163 mg/dL — ABNORMAL HIGH (ref 70–99)
Potassium: 4 mmol/L (ref 3.5–5.1)
Sodium: 135 mmol/L (ref 135–145)

## 2022-09-17 MED ORDER — PHENOL 1.4 % MT LIQD
1.0000 | OROMUCOSAL | Status: DC | PRN
  Administered 2022-09-17: 1 via OROMUCOSAL
  Filled 2022-09-17: qty 177

## 2022-09-17 NOTE — Plan of Care (Signed)
?  Problem: Clinical Measurements: ?Goal: Will remain free from infection ?Outcome: Progressing ?  ?

## 2022-09-17 NOTE — Progress Notes (Signed)
   09/17/22 2200  BiPAP/CPAP/SIPAP  BiPAP/CPAP/SIPAP Pt Type Adult  BiPAP/CPAP/SIPAP Resmed  Reason BIPAP/CPAP not in use Other(comment) (Pt stated she wants to hold off on cpap tonight, will call if she can't sleep and needs it. On 2L oxygen.)

## 2022-09-17 NOTE — Progress Notes (Signed)
PROGRESS NOTE    [name]  ZOX:[id number] DOB: Mar 09, 1951 DOA: 09/14/2022 PCP: Carylon Perches, MD   Brief Narrative: Patient is a 72 year old female, morbidly obese, with past medical history significant for COPD, OSA on CPAP, rheumatoid arthritis, hypertension and insulin-dependent diabetes.  Patient presented with fever and shortness of breath.  On presentation to the emergency room, patient was found to be febrile, hypoxic oxygen saturation 88 on room air hypotensive systolic blood pressure 83.  White blood cell 12.  Lactic acid 2.8.  CT demonstrating large left lower lobe infiltrate and mild diffuse urinary bladder wall thickening.  Patient was admitted for further assessment and management of sepsis secondary to pneumonia and hypoxic respiratory failure.  09/17/2022: Patient seen.  Patient is slowly improving.  Will continue antibiotics for now.   Assessment & Plan:   Principal Problem:   Sepsis due to pneumonia [facility]) Active Problems:   Obstructive sleep apnea   COPD (chronic obstructive pulmonary disease) with emphysema (HCC)   Insulin-requiring or dependent type II diabetes mellitus (HCC)   Rheumatoid arthritis (HCC)   1-Sepsis secondary to pneumonia: -Patient presents with fever, SOB, Cough. CT chest:  new large infiltrate in left lower lobe suggesting pneumonia. There is interval subtle increase in interstitial markings in the lingula, lateral segment of right middle lobe and in right lower lobe suggesting possible interstitial pneumonia -She report 4 episodes of PNA last year. Speech therapist consulted. No evidence of aspiration.  -MRSA PCR. Negative --Blood culture: Growing Strep Pneumoniae.  -Continue with Ceftriaxone day 2. Would treat for at least 10 days with  antibiotics.  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   Acute hypoxic  respiratory failure secondary to pneumonia: Continue with oxygen supplementation.  Continue with inhaler. Albuterol Schedule.  Continue with  Guaifenesin.  Flutter valve.   COPD, OSA: Continue with Breo.  CPAP  Insulin-dependent Diabetes type 2: Start Insulin while in the hospital.  Resume Trulicity metformin at discharge.   Rheumatoid arthritis: On methotrexate and Remicade at home.   Diarrhea; C diff negative Start Florastore.   Estimated body mass index is 47.74 kg/m as calculated from the following:   Height as of this encounter: 5\' 2"  (1.575 m).   Weight as of this encounter: 118.4 kg.   DVT prophylaxis: Lovenox Code Status: Full code Family Communication: Crae discussed with patient Disposition Plan:  Status is: Inpatient Remains inpatient appropriate because: management of sepsis.     Consultants:  None  Procedures:  none  Antimicrobials:    Subjective: No fever or chills. Patient continues to have productive cough. No chest pain  Objective: Vitals:   09/17/22 0022 09/17/22 0427 09/17/22 0746 09/17/22 0848  BP: 130/70 128/63 125/67   Pulse: 73 74 88 87  Resp: 18 17 19 20   Temp: (!) 97.5 F (36.4 C) 98.4 F (36.9 C) 98.1 F (36.7 C)   TempSrc: Oral Oral Oral   SpO2: 92% 94% 91% 95%  Weight:      Height:        Intake/Output Summary (Last 24 hours) at 09/17/2022 0953 Last data filed at 09/17/2022 0907 Gross per 24 hour  Intake 343 ml  Output --  Net 343 ml    Filed Weights   09/14/22 1825 09/15/22 0054  Weight: 116.4 kg 118.4 kg    Examination:  General exam: Patient is awake and alert.  Patient  is morbidly obese.  Significant abdominal wall hernia. HEENT: No pallor or jaundice. Respiratory system: Clear to auscultation. Cardiovascular system: S 1, S 2 RRR Gastrointestinal system: BS present, soft, nt.  Abdominal hernia. Central nervous system: Alert, oriented,    Data Reviewed: I have personally reviewed following labs and  imaging studies  CBC: Recent Labs  Lab 09/14/22 1759 09/15/22 0301 09/16/22 0144 09/17/22 0150  WBC 12.7* 18.8* 12.5* 8.5  NEUTROABS 11.4*  --   --   --   HGB 16.1* 13.8 12.8 14.2  HCT 49.2* 42.0 39.9 43.9  MCV 90.6 92.1 91.5 92.2  PLT 142* 132* 112* 110*    Basic Metabolic Panel: Recent Labs  Lab 09/14/22 1759 09/15/22 0301 09/16/22 0144 09/17/22 0150  NA 138 134* 135 135  K 4.9 4.1 3.3* 4.0  CL 101 101 103 103  CO2 27 23 23  21*  GLUCOSE 102* 80 140* 163*  BUN 19 20 17 17   CREATININE 0.88 0.99 0.86 0.76  CALCIUM 9.8 8.1* 8.4* 9.0    GFR: Estimated Creatinine Clearance: 78.8 mL/min (by C-G formula based on SCr of 0.76 mg/dL). Liver Function Tests: Recent Labs  Lab 09/14/22 1759  AST 23  ALT 24  ALKPHOS 68  BILITOT 0.8  PROT 6.9  ALBUMIN 4.1    No results for input(s): "LIPASE", "AMYLASE" in the last 168 hours. No results for input(s): "AMMONIA" in the last 168 hours. Coagulation Profile: Recent Labs  Lab 09/14/22 1759  INR 1.1    Cardiac Enzymes: No results for input(s): "CKTOTAL", "CKMB", "CKMBINDEX", "TROPONINI" in the last 168 hours. BNP (last 3 results) No results for input(s): "PROBNP" in the last 8760 hours. HbA1C: Recent Labs    09/15/22 0249  HGBA1C 6.1*    CBG: Recent Labs  Lab 09/16/22 0633 09/16/22 1210 09/16/22 1627 09/16/22 2119 09/17/22 0612  GLUCAP 179* 175* 155* 192* 199*    Lipid Profile: No results for input(s): "CHOL", "HDL", "LDLCALC", "TRIG", "CHOLHDL", "LDLDIRECT" in the last 72 hours. Thyroid Function Tests: No results for input(s): "TSH", "T4TOTAL", "FREET4", "T3FREE", "THYROIDAB" in the last 72 hours. Anemia Panel: No results for input(s): "VITAMINB12", "FOLATE", "FERRITIN", "TIBC", "IRON", "RETICCTPCT" in the last 72 hours. Sepsis Labs: Recent Labs  Lab 09/14/22 1759 09/14/22 2021 09/14/22 2350 09/15/22 0252 09/15/22 0301 09/16/22 0144  PROCALCITON  --   --   --  8.11  --  5.62  LATICACIDVEN 2.8*  3.3* 2.0*  --  2.0*  --      Recent Results (from the past 240 hour(s))  Culture, blood (Routine x 2)     Status: Abnormal   Collection Time: 09/14/22  6:09 PM   Specimen: BLOOD  Result Value Ref Range Status   Specimen Description   Final    BLOOD LEFT ANTECUBITAL Performed at [facility], [address] [place], [place] [address]    Special Requests   Final    BOTTLES DRAWN AEROBIC AND ANAEROBIC Blood Culture adequate volume Performed at [facility], [address], [place], [place] [address]    Culture  Setup Time   Final    GRAM POSITIVE COCCI IN CHAINS IN BOTH AEROBIC AND ANAEROBIC BOTTLES CRITICAL RESULT CALLED TO, READ BACK BY AND VERIFIED WITH: Vivien Rossetti [id number] @1430  BY SM Performed at [facility], [address] [place], [place] [address]    Culture STREPTOCOCCUS PNEUMONIAE (A)  Final   Report Status 09/17/2022 FINAL  Final   Organism ID, Bacteria STREPTOCOCCUS PNEUMONIAE  Final      Susceptibility   Streptococcus pneumoniae - MIC*    ERYTHROMYCIN <=0.12 SENSITIVE Sensitive     LEVOFLOXACIN 0.5 SENSITIVE Sensitive     VANCOMYCIN <=0.12 SENSITIVE Sensitive     PENICILLIN (meningitis) <=0.06 SENSITIVE Sensitive     PENO - penicillin <=0.06      PENICILLIN (non-meningitis) <=0.06 SENSITIVE Sensitive     PENICILLIN (oral) <=0.06 SENSITIVE Sensitive     CEFTRIAXONE (non-meningitis) <=0.12 SENSITIVE Sensitive     CEFTRIAXONE (meningitis) <=0.12 SENSITIVE Sensitive     * STREPTOCOCCUS PNEUMONIAE  Blood Culture ID Panel (Reflexed)     Status: Abnormal   Collection Time: 09/14/22  6:09 PM  Result Value Ref Range Status   Enterococcus faecalis NOT DETECTED NOT DETECTED Final   Enterococcus Faecium NOT DETECTED NOT DETECTED Final   Listeria monocytogenes NOT DETECTED NOT DETECTED Final   Staphylococcus species NOT DETECTED NOT DETECTED Final   Staphylococcus aureus (BCID) NOT DETECTED NOT DETECTED Final    Staphylococcus epidermidis NOT DETECTED NOT DETECTED Final   Staphylococcus lugdunensis NOT DETECTED NOT DETECTED Final   Streptococcus species DETECTED (A) NOT DETECTED Final    Comment: CRITICAL RESULT CALLED TO, READ BACK BY AND VERIFIED WITH: PHARMD ELIZABETH [id number] @1430  BY SM    Streptococcus agalactiae NOT DETECTED NOT DETECTED Final   Streptococcus pneumoniae DETECTED (A) NOT DETECTED Final    Comment: CRITICAL RESULT CALLED TO, READ BACK BY AND VERIFIED WITH: PHARMD ELIZABETH [id number] @1430  BY SM    Streptococcus pyogenes NOT DETECTED NOT DETECTED Final   A.calcoaceticus-baumannii NOT DETECTED NOT DETECTED Final   Bacteroides fragilis NOT DETECTED NOT DETECTED Final   Enterobacterales NOT DETECTED NOT DETECTED Final   Enterobacter cloacae complex NOT DETECTED NOT DETECTED Final   Escherichia coli NOT DETECTED NOT DETECTED Final   Klebsiella aerogenes NOT DETECTED NOT DETECTED Final   Klebsiella oxytoca NOT DETECTED NOT DETECTED Final   Klebsiella pneumoniae NOT DETECTED NOT DETECTED Final   Proteus species NOT DETECTED NOT DETECTED Final   Salmonella species NOT DETECTED NOT DETECTED Final   Serratia marcescens NOT DETECTED NOT DETECTED Final   Haemophilus influenzae NOT DETECTED NOT DETECTED Final   Neisseria meningitidis NOT DETECTED NOT DETECTED Final   Pseudomonas aeruginosa NOT DETECTED NOT DETECTED Final   Stenotrophomonas maltophilia NOT DETECTED NOT DETECTED Final   Candida albicans NOT DETECTED NOT DETECTED Final   Candida auris NOT DETECTED NOT DETECTED Final   Candida glabrata NOT DETECTED NOT DETECTED Final   Candida krusei NOT DETECTED NOT DETECTED Final   Candida parapsilosis NOT DETECTED NOT DETECTED Final   Candida tropicalis NOT DETECTED NOT DETECTED Final   Cryptococcus neoformans/gattii NOT DETECTED NOT DETECTED Final    Comment: Performed at [facility], [address] [place], [place] [address]  Urine Culture     Status: None   Collection Time:  09/14/22  6:33 PM   Specimen: Urine, Random  Result Value Ref Range Status   Specimen Description   Final    URINE, RANDOM Performed at [facility], [address], [place], [place] [address]    Special Requests   Final    NONE Reflexed from [id number] Performed at [facility], [address] [place], [place] [address]    Culture   Final    NO GROWTH Performed at [facility], [address] [place], [place] [address]    Report Status 09/15/2022 FINAL  Final  Resp panel by RT-PCR (RSV,  Flu A&B, Covid) Anterior Nasal Swab     Status: None   Collection Time: 09/14/22  7:20 PM   Specimen: Anterior Nasal Swab  Result Value Ref Range Status   SARS Coronavirus 2 by RT PCR NEGATIVE NEGATIVE Final    Comment: (NOTE) SARS-CoV-2 target nucleic acids are NOT DETECTED.  The SARS-CoV-2 RNA is generally detectable in upper respiratory specimens during the acute phase of infection. The lowest concentration of SARS-CoV-2 viral copies this assay can detect is 138 copies/mL. A negative result does not preclude SARS-Cov-2 infection and should not be used as the sole basis for treatment or other patient management decisions. A negative result may occur with  improper specimen collection/handling, submission of specimen other than nasopharyngeal swab, presence of viral mutation(s) within the areas targeted by this assay, and inadequate number of viral copies(<138 copies/mL). A negative result must be combined with clinical observations, patient history, and epidemiological information. The expected result is Negative.  Fact Sheet for Patients:  BloggerCourse.com  Fact Sheet for Healthcare Providers:  SeriousBroker.it  This test is no t yet approved or cleared by the [place] FDA and  has been authorized for detection and/or diagnosis of SARS-CoV-2 by FDA under an Emergency Use Authorization  (EUA). This EUA will remain  in effect (meaning this test can be used) for the duration of the COVID-19 declaration under Section 564(b)(1) of the Act, 21 U.S.C.section 360bbb-3(b)(1), unless the authorization is terminated  or revoked sooner.       Influenza A by PCR NEGATIVE NEGATIVE Final   Influenza B by PCR NEGATIVE NEGATIVE Final    Comment: (NOTE) The Xpert Xpress SARS-CoV-2/FLU/RSV plus assay is intended as an aid in the diagnosis of influenza from Nasopharyngeal swab specimens and should not be used as a sole basis for treatment. Nasal washings and aspirates are unacceptable for Xpert Xpress SARS-CoV-2/FLU/RSV testing.  Fact Sheet for Patients: BloggerCourse.com  Fact Sheet for Healthcare Providers: SeriousBroker.it  This test is not yet approved or cleared by the [place] FDA and has been authorized for detection and/or diagnosis of SARS-CoV-2 by FDA under an Emergency Use Authorization (EUA). This EUA will remain in effect (meaning this test can be used) for the duration of the COVID-19 declaration under Section 564(b)(1) of the Act, 21 U.S.C. section 360bbb-3(b)(1), unless the authorization is terminated or revoked.     Resp Syncytial Virus by PCR NEGATIVE NEGATIVE Final    Comment: (NOTE) Fact Sheet for Patients: BloggerCourse.com  Fact Sheet for Healthcare Providers: SeriousBroker.it  This test is not yet approved or cleared by the [place] FDA and has been authorized for detection and/or diagnosis of SARS-CoV-2 by FDA under an Emergency Use Authorization (EUA). This EUA will remain in effect (meaning this test can be used) for the duration of the COVID-19 declaration under Section 564(b)(1) of the Act, 21 U.S.C. section 360bbb-3(b)(1), unless the authorization is terminated or revoked.  Performed at [facility], [address], [place], [place] [address]   Culture, blood (Routine x 2)     Status: None (Preliminary result)   Collection Time: 09/15/22  2:49 AM   Specimen: BLOOD  Result Value Ref Range Status   Specimen Description BLOOD BLOOD LEFT ARM  Final   Special Requests   Final    BOTTLES DRAWN AEROBIC AND ANAEROBIC Blood Culture adequate volume   Culture   Final    NO GROWTH 2 DAYS Performed at [facility], [address] [place], [place]  [address]    Report Status PENDING  Incomplete  MRSA Next Gen by PCR, Nasal     Status: None   Collection Time: 09/15/22  8:59 AM   Specimen: Nasal Mucosa; Nasal Swab  Result Value Ref Range Status   MRSA by PCR Next Gen NOT DETECTED NOT DETECTED Final    Comment: (NOTE) The GeneXpert MRSA Assay (FDA approved for NASAL specimens only), is one component of a comprehensive MRSA colonization surveillance program. It is not intended to diagnose MRSA infection nor to guide or monitor treatment for MRSA infections. Test performance is not FDA approved in patients less than 28 years old. Performed at [facility], [address] [place], [place] [address]   C Difficile Quick Screen w PCR reflex     Status: None   Collection Time: 09/15/22  7:29 PM   Specimen: STOOL  Result Value Ref Range Status   C Diff antigen NEGATIVE NEGATIVE Final   C Diff toxin NEGATIVE NEGATIVE Final   C Diff interpretation No C. difficile detected.  Final    Comment: Performed at [facility], [address], [place], [place] [address]         Radiology Studies: DG Swallowing Func-Speech Pathology  Result Date: 09/15/2022 Table formatting from the original result was not included. Modified Barium Swallow Study Patient Details Name: [name] MRN: [id number] Date of Birth: 1951/03/04 Today's Date: 09/15/2022 HPI/PMH: HPI: TANEISHA FUSON is a pleasant 72 y.o. female with medical history significant for COPD, OSA on CPAP, rheumatoid arthritis, hypertension,  insulin-dependent diabetes mellitus, and BMI 48 w ho presents to the ED with shortness of breath and fever. Has recurrent pna. CT chest.  There is 10 mm faint ground-glass density in left  upper lobe with no significant change. There is new large infiltrate  in left lower lobe suggesting pneumonia. Air bronchogram is seen in  this new infiltrate. There is prominence of markings in the lingula,  right middle lobe and right lower lobe with interval worsening. Clinical Impression: Clinical Impression: Pt demosntrates a slight delay in swallow initaition with thin and nectar thick liquids penetrating the vestibule with immediate ejection during the height of the swallow PAS 2/flash penetration. No aspiration, no residue. Risk of silent/microaspiration very low. Pt almost always coughs after a swallow though airway clear. Highly sensate. Recommend pt continue regular diet and thin liquids. No SLP f/u needed for dysphagia. Factors that may increase risk of adverse event in presence of aspiration Rubye Oaks & Clearance Coots 2021): Factors that may increase risk of adverse event in presence of aspiration Rubye Oaks & Clearance Coots 2021): Poor general health and/or compromised immunity Recommendations/Plan: Swallowing Evaluation Recommendations Swallowing Evaluation Recommendations Recommendations: PO diet PO Diet Recommendation: Regular; Thin liquids (Level 0) Liquid Administration via: Cup; Straw Medication Administration: Whole meds with liquid Supervision: Patient able to self-feed Postural changes: Position pt fully upright for meals Oral care recommendations: Oral care BID (2x/day) Treatment Plan Treatment Plan Treatment recommendations: No treatment recommended at this time Treatment frequency: Min 2x/week Treatment duration: 2 weeks Recommendations Recommendations for follow up therapy are one component of a multi-disciplinary discharge planning process, led by the attending physician.  Recommendations may be updated based on patient  status, additional functional criteria and insurance authorization. Assessment: Orofacial Exam: Orofacial Exam Oral Cavity - Dentition: Adequate natural dentition Anatomy: Anatomy: WFL Boluses Administered: Boluses Administered Boluses Administered: Thin liquids (Level 0); Mildly thick liquids (Level 2, nectar thick); Puree; Solid  Oral Impairment Domain: Oral  Impairment Domain Lip Closure: No labial escape Tongue control during bolus hold: Cohesive bolus between tongue to palatal seal Bolus preparation/mastication: Timely and efficient chewing and mashing Bolus transport/lingual motion: Brisk tongue motion Oral residue: Complete oral clearance Initiation of pharyngeal swallow : Pyriform sinuses  Pharyngeal Impairment Domain: Pharyngeal Impairment Domain Laryngeal elevation: Complete superior movement of thyroid cartilage with complete approximation of arytenoids to epiglottic petiole Anterior hyoid excursion: Complete anterior movement Epiglottic movement: Complete inversion Laryngeal vestibule closure: Complete, no air/contrast in laryngeal vestibule Pharyngeal stripping wave : Present - complete Pharyngeal contraction (A/P view only): Complete Pharyngoesophageal segment opening: Complete distension and complete duration, no obstruction of flow Tongue base retraction: No contrast between tongue base and posterior pharyngeal wall (PPW) Pharyngeal residue: Complete pharyngeal clearance  Esophageal Impairment Domain: Esophageal Impairment Domain Esophageal clearance upright position: Esophageal retention (pill hesitated mid esophagus, but cleared. Pt reported globus) Pill: Esophageal Impairment Domain Esophageal clearance upright position: Esophageal retention (pill hesitated mid esophagus, but cleared. Pt reported globus) Penetration/Aspiration Scale Score: Penetration/Aspiration Scale Score 1.  Material does not enter airway: Puree; Solid 2.  Material enters airway, remains ABOVE vocal cords then ejected out: Thin  liquids (Level 0); Mildly thick liquids (Level 2, nectar thick) Compensatory Strategies: Compensatory Strategies Compensatory strategies: No   General Information: Caregiver present: No  Diet Prior to this Study: Regular; Thin liquids (Level 0)   Temperature : Normal   No data recorded  Supplemental O2: Nasal cannula   No data recorded Behavior/Cognition: Alert; Cooperative; Pleasant mood Self-Feeding Abilities: Able to self-feed Baseline vocal quality/speech: Normal Volitional Cough: Able to elicit Volitional Swallow: Able to elicit No data recorded Goal Planning: No data recorded No data recorded No data recorded No data recorded No data recorded Pain: No data recorded End of Session: Start Time:SLP Start Time (ACUTE ONLY): 1223 Stop Time: SLP Stop Time (ACUTE ONLY): 1240 Time Calculation:SLP Time Calculation (min) (ACUTE ONLY): 17 min Charges: SLP Evaluations $ SLP Speech Visit: 1 Visit SLP Evaluations $BSS Swallow: 1 Procedure $MBS Swallow: 1 Procedure SLP visit diagnosis: SLP Visit Diagnosis: Dysphagia, unspecified (R13.10) Past Medical History: Past Medical History: Diagnosis Date  Asthma   COPD (chronic obstructive pulmonary disease) (HCC)   Depression   DM type 1 (diabetes mellitus, type 1) (HCC)   GERD (gastroesophageal reflux disease)   Hyperlipidemia   Hypertension   Morbid obesity (HCC)   Obstructive sleep apnea syndrome   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  PONV (postoperative nausea and vomiting)   Rheumatoid arthritis(714.0)   on methotrexate therapy  Septic arthritis of knee, right (HCC) 04/2010 Past Surgical History: Past Surgical History: Procedure Laterality Date  ABDOMINAL HYSTERECTOMY  1992  ABDOMINAL WALL MESH  REMOVAL  08/13/2009  with removal of retained stitches  AORTO-PULMONARY WINDOW REPAIR  1992  for cardiac tamponade  APPENDECTOMY  1967  BIOPSY  10/29/2018  Procedure: BIOPSY;  Surgeon: Sherrilyn Rist, MD;  Location: [facility];  Service: Gastroenterology;;  BONE BIOPSY  Right 04/19/2016  Procedure: BONE BIOPSY RIGHT GREAT TOE;  Surgeon: Ferman Hamming, DPM;  Location: [facility];  Service: Podiatry;  Laterality: Right;  CHOLECYSTECTOMY  1992  laparoscopic  COLONOSCOPY  12/08/2011  Procedure: COLONOSCOPY;  Surgeon: Malissa Hippo, MD;  Location: [facility];  Service: Endoscopy;  Laterality: N/A;  830  COLONOSCOPY WITH PROPOFOL N/A 10/16/2017  Procedure: COLONOSCOPY WITH PROPOFOL;  Surgeon: Sherrilyn Rist, MD;  Location: [facility];  Service: Gastroenterology;  Laterality: N/A;  COLONOSCOPY WITH PROPOFOL N/A  10/29/2018  Procedure: COLONOSCOPY WITH PROPOFOL;  Surgeon: Sherrilyn Rist, MD;  Location: [facility];  Service: Gastroenterology;  Laterality: N/A;  ESOPHAGOGASTRODUODENOSCOPY (EGD) WITH PROPOFOL N/A 10/29/2018  Procedure: ESOPHAGOGASTRODUODENOSCOPY (EGD) WITH PROPOFOL;  Surgeon: Sherrilyn Rist, MD;  Location: [facility];  Service: Gastroenterology;  Laterality: N/A;  FOREIGN BODY REMOVAL ABDOMINAL  01/04/2010  stitch abscesses  HERNIA REPAIR  2001  open LOA / VWH repair w mesh.  Dr. Lovell Sheehan  HERNIA REPAIR  2007  open redo LOA / VWH repair w mesh.  Dr. Lovell Sheehan  POLYPECTOMY  10/16/2017  Procedure: POLYPECTOMY;  Surgeon: Sherrilyn Rist, MD;  Location: [facility];  Service: Gastroenterology;;  POLYPECTOMY  10/29/2018  Procedure: POLYPECTOMY;  Surgeon: Sherrilyn Rist, MD;  Location: [facility];  Service: Gastroenterology;;  REVISION TOTAL KNEE ARTHROPLASTY  06/09/2010  I and D   TOTAL KNEE ARTHROPLASTY  05/07/2010  right knee DeBlois, Riley Nearing 09/15/2022, 12:44 PM       Scheduled Meds:  enoxaparin (LOVENOX) injection  60 mg Subcutaneous Q24H   fluticasone furoate-vilanterol  1 puff Inhalation Daily   And   umeclidinium bromide  1 puff Inhalation Daily   gabapentin  600 mg Oral QHS   guaiFENesin  600 mg Oral BID   insulin aspart  0-5 Units Subcutaneous QHS   insulin aspart  0-6 Units Subcutaneous TID WC   insulin glargine-yfgn  10 Units  Subcutaneous QHS   montelukast  10 mg Oral QHS   pantoprazole  40 mg Oral Daily   saccharomyces boulardii  250 mg Oral BID   sertraline  50 mg Oral Daily   silver sulfADIAZINE   Topical Daily   simvastatin  40 mg Oral Q supper   sodium chloride flush  3 mL Intravenous Q12H   Continuous Infusions:  sodium chloride Stopped (09/15/22 1000)   cefTRIAXone (ROCEPHIN)  IV 2 g (09/17/22 0910)     LOS: 2 days    Time spent: 35 minutes    Barnetta Chapel, MD [facility]   If 7PM-7AM, please contact night-coverage [email or web address]  09/17/2022, 9:53 AM

## 2022-09-18 DIAGNOSIS — J189 Pneumonia, unspecified organism: Secondary | ICD-10-CM | POA: Diagnosis not present

## 2022-09-18 DIAGNOSIS — A419 Sepsis, unspecified organism: Secondary | ICD-10-CM | POA: Diagnosis not present

## 2022-09-18 LAB — CULTURE, BLOOD (ROUTINE X 2): Special Requests: ADEQUATE

## 2022-09-18 LAB — GLUCOSE, CAPILLARY
Glucose-Capillary: 184 mg/dL — ABNORMAL HIGH (ref 70–99)
Glucose-Capillary: 170 mg/dL — ABNORMAL HIGH (ref 70–99)
Glucose-Capillary: 243 mg/dL — ABNORMAL HIGH (ref 70–99)
Glucose-Capillary: 224 mg/dL — ABNORMAL HIGH (ref 70–99)
Glucose-Capillary: 242 mg/dL — ABNORMAL HIGH (ref 70–99)

## 2022-09-18 NOTE — Progress Notes (Signed)
   09/18/22 2145  BiPAP/CPAP/SIPAP  BiPAP/CPAP/SIPAP Pt Type Adult  Reason BIPAP/CPAP not in use  (Patient refused, only wanted one of her PRN breathing treatments)  FiO2 (%) 21 %  BiPAP/CPAP /SiPAP Vitals  Bilateral Breath Sounds Diminished

## 2022-09-18 NOTE — Progress Notes (Signed)
PROGRESS NOTE    [name]  WGN:[id number] DOB: 01-02-1951 DOA: 09/14/2022 PCP: Carylon Perches, MD   Brief Narrative: Patient is a 72 year old female, morbidly obese, with past medical history significant for COPD, OSA on CPAP, rheumatoid arthritis, hypertension and insulin-dependent diabetes.  Patient presented with fever and shortness of breath.  On presentation to the emergency room, patient was found to be febrile, hypoxic oxygen saturation 88 on room air hypotensive systolic blood pressure 83.  White blood cell 12.  Lactic acid 2.8.  CT demonstrating large left lower lobe infiltrate and mild diffuse urinary bladder wall thickening.  Patient was admitted for further assessment and management of sepsis secondary to pneumonia and hypoxic respiratory failure.  09/17/2022: Patient seen.  Patient is slowly improving.  Will continue antibiotics for now. 09/18/2022: Patient remains stable.  Will continue IV Rocephin for now.  Possible discharge tomorrow on oral antibiotics to complete 10-day course of antibiotics.   Assessment & Plan:   Principal Problem:   Sepsis due to pneumonia [facility]) Active Problems:   Obstructive sleep apnea   COPD (chronic obstructive pulmonary disease) with emphysema (HCC)   Insulin-requiring or dependent type II diabetes mellitus (HCC)   Rheumatoid arthritis (HCC)   1-Sepsis secondary to pneumonia: -Patient presents with fever, SOB, Cough. CT chest:  new large infiltrate in left lower lobe suggesting pneumonia. There is interval subtle increase in interstitial markings in the lingula, lateral segment of right middle lobe and in right lower lobe suggesting possible interstitial pneumonia -She report 4 episodes of PNA last year. Speech therapist consulted. No evidence of aspiration.  -MRSA PCR. Negative --Blood culture: Growing Strep Pneumoniae.  -Continue with Ceftriaxone day 2. Would treat for at least 10 days with  antibiotics.  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  Streptococcus  Pneumoniae Bacteremia:  1-of 2 Blood culture positive for Strep Pneumoniae  Continue with IV ceftriaxone.   Acute hypoxic respiratory failure secondary to pneumonia: Continue with oxygen supplementation.  Continue with inhaler. Albuterol Schedule.  Continue with  Guaifenesin.  Flutter valve.  09/18/2022: Improved significantly.  COPD, OSA: Continue with Breo.  CPAP  Insulin-dependent Diabetes type 2: Start Insulin while in the hospital.  Resume Trulicity metformin at discharge.   Rheumatoid arthritis: On methotrexate and Remicade at home.   Diarrhea; C diff negative Start Florastore.   Estimated body mass index is 47.74 kg/m as calculated from the following:   Height as of this encounter: 5\' 2"  (1.575 m).   Weight as of this encounter: 118.4 kg.   DVT prophylaxis: Lovenox Code Status: Full code Family Communication: Crae discussed with patient Disposition Plan:  Status is: Inpatient Remains inpatient appropriate because: management of sepsis.     Consultants:  None  Procedures:  none  Antimicrobials:    Subjective: No fever or chills. Patient continues to have productive cough.  Objective: Vitals:   09/18/22 0636 09/18/22 0743 09/18/22 1208 09/18/22 1618  BP: 115/62 122/60 127/66 126/67  Pulse: 73 74 72 71  Resp: 16     Temp: 98.7 F (37.1 C) 98.6 F (37 C) 98.1 F (36.7 C) 98.4 F (36.9 C)  TempSrc: Oral Oral Oral Oral  SpO2: 93% 92% 90% 91%  Weight:      Height:        Intake/Output Summary (Last 24 hours) at 09/18/2022 1723 Last data filed at 09/17/2022 2059 Gross per 24 hour  Intake 100 ml  Output --  Net 100 ml  Filed Weights   09/14/22 1825 09/15/22 0054  Weight: 116.4 kg 118.4 kg    Examination:  General exam: Patient is awake and alert.  Patient is morbidly obese.  Significant abdominal wall hernia. HEENT: No pallor or jaundice. Respiratory  system: Clear to auscultation. Cardiovascular system: S 1, S 2 RRR Gastrointestinal system: BS present, soft, nt.  Abdominal hernia. Central nervous system: Alert, oriented,    Data Reviewed: I have personally reviewed following labs and imaging studies  CBC: Recent Labs  Lab 09/14/22 1759 09/15/22 0301 09/16/22 0144 09/17/22 0150  WBC 12.7* 18.8* 12.5* 8.5  NEUTROABS 11.4*  --   --   --   HGB 16.1* 13.8 12.8 14.2  HCT 49.2* 42.0 39.9 43.9  MCV 90.6 92.1 91.5 92.2  PLT 142* 132* 112* 110*    Basic Metabolic Panel: Recent Labs  Lab 09/14/22 1759 09/15/22 0301 09/16/22 0144 09/17/22 0150  NA 138 134* 135 135  K 4.9 4.1 3.3* 4.0  CL 101 101 103 103  CO2 27 23 23  21*  GLUCOSE 102* 80 140* 163*  BUN 19 20 17 17   CREATININE 0.88 0.99 0.86 0.76  CALCIUM 9.8 8.1* 8.4* 9.0    GFR: Estimated Creatinine Clearance: 78.8 mL/min (by C-G formula based on SCr of 0.76 mg/dL). Liver Function Tests: Recent Labs  Lab 09/14/22 1759  AST 23  ALT 24  ALKPHOS 68  BILITOT 0.8  PROT 6.9  ALBUMIN 4.1    No results for input(s): "LIPASE", "AMYLASE" in the last 168 hours. No results for input(s): "AMMONIA" in the last 168 hours. Coagulation Profile: Recent Labs  Lab 09/14/22 1759  INR 1.1    Cardiac Enzymes: No results for input(s): "CKTOTAL", "CKMB", "CKMBINDEX", "TROPONINI" in the last 168 hours. BNP (last 3 results) No results for input(s): "PROBNP" in the last 8760 hours. HbA1C: No results for input(s): "HGBA1C" in the last 72 hours.  CBG: Recent Labs  Lab 09/17/22 2121 09/18/22 0638 09/18/22 0746 09/18/22 1211 09/18/22 1617  GLUCAP 253* 184* 170* 243* 224*    Lipid Profile: No results for input(s): "CHOL", "HDL", "LDLCALC", "TRIG", "CHOLHDL", "LDLDIRECT" in the last 72 hours. Thyroid Function Tests: No results for input(s): "TSH", "T4TOTAL", "FREET4", "T3FREE", "THYROIDAB" in the last 72 hours. Anemia Panel: No results for input(s): "VITAMINB12",  "FOLATE", "FERRITIN", "TIBC", "IRON", "RETICCTPCT" in the last 72 hours. Sepsis Labs: Recent Labs  Lab 09/14/22 1759 09/14/22 2021 09/14/22 2350 09/15/22 0252 09/15/22 0301 09/16/22 0144  PROCALCITON  --   --   --  8.11  --  5.62  LATICACIDVEN 2.8* 3.3* 2.0*  --  2.0*  --      Recent Results (from the past 240 hour(s))  Culture, blood (Routine x 2)     Status: Abnormal   Collection Time: 09/14/22  6:09 PM   Specimen: BLOOD  Result Value Ref Range Status   Specimen Description BLOOD LEFT ANTECUBITAL  Final   Special Requests   Final    BOTTLES DRAWN AEROBIC AND ANAEROBIC Blood Culture adequate volume   Culture  Setup Time   Final    GRAM POSITIVE COCCI IN CHAINS IN BOTH AEROBIC AND ANAEROBIC BOTTLES CRITICAL RESULT CALLED TO, READ BACK BY AND VERIFIED WITH: PHARMD ELIZABETH [id number] @1430  BY SM    Culture STREPTOCOCCUS PNEUMONIAE (A)  Final   Report Status 09/17/2022 FINAL  Final   Organism ID, Bacteria STREPTOCOCCUS PNEUMONIAE  Final      Susceptibility   Streptococcus pneumoniae - MIC*  ERYTHROMYCIN <=0.12 SENSITIVE Sensitive     LEVOFLOXACIN 0.5 SENSITIVE Sensitive     VANCOMYCIN <=0.12 SENSITIVE Sensitive     PENO - penicillin <=0.06      PENICILLIN (non-meningitis) <=0.06 SENSITIVE Sensitive     PENICILLIN (oral) <=0.06 SENSITIVE Sensitive     CEFTRIAXONE (non-meningitis) Value in next row Sensitive      <=0.12 SENSITIVEPerformed at [facility], [address], [place], [place] [address]    * STREPTOCOCCUS PNEUMONIAE  Blood Culture ID Panel (Reflexed)     Status: Abnormal   Collection Time: 09/14/22  6:09 PM  Result Value Ref Range Status   Enterococcus faecalis NOT DETECTED NOT DETECTED Final   Enterococcus Faecium NOT DETECTED NOT DETECTED Final   Listeria monocytogenes NOT DETECTED NOT DETECTED Final   Staphylococcus species NOT DETECTED NOT DETECTED Final   Staphylococcus aureus (BCID) NOT DETECTED NOT DETECTED Final   Staphylococcus epidermidis NOT  DETECTED NOT DETECTED Final   Staphylococcus lugdunensis NOT DETECTED NOT DETECTED Final   Streptococcus species DETECTED (A) NOT DETECTED Final    Comment: CRITICAL RESULT CALLED TO, READ BACK BY AND VERIFIED WITH: PHARMD ELIZABETH [id number] @1430  BY SM    Streptococcus agalactiae NOT DETECTED NOT DETECTED Final   Streptococcus pneumoniae DETECTED (A) NOT DETECTED Final    Comment: CRITICAL RESULT CALLED TO, READ BACK BY AND VERIFIED WITH: PHARMD ELIZABETH [id number] @1430  BY SM    Streptococcus pyogenes NOT DETECTED NOT DETECTED Final   A.calcoaceticus-baumannii NOT DETECTED NOT DETECTED Final   Bacteroides fragilis NOT DETECTED NOT DETECTED Final   Enterobacterales NOT DETECTED NOT DETECTED Final   Enterobacter cloacae complex NOT DETECTED NOT DETECTED Final   Escherichia coli NOT DETECTED NOT DETECTED Final   Klebsiella aerogenes NOT DETECTED NOT DETECTED Final   Klebsiella oxytoca NOT DETECTED NOT DETECTED Final   Klebsiella pneumoniae NOT DETECTED NOT DETECTED Final   Proteus species NOT DETECTED NOT DETECTED Final   Salmonella species NOT DETECTED NOT DETECTED Final   Serratia marcescens NOT DETECTED NOT DETECTED Final   Haemophilus influenzae NOT DETECTED NOT DETECTED Final   Neisseria meningitidis NOT DETECTED NOT DETECTED Final   Pseudomonas aeruginosa NOT DETECTED NOT DETECTED Final   Stenotrophomonas maltophilia NOT DETECTED NOT DETECTED Final   Candida albicans NOT DETECTED NOT DETECTED Final   Candida auris NOT DETECTED NOT DETECTED Final   Candida glabrata NOT DETECTED NOT DETECTED Final   Candida krusei NOT DETECTED NOT DETECTED Final   Candida parapsilosis NOT DETECTED NOT DETECTED Final   Candida tropicalis NOT DETECTED NOT DETECTED Final   Cryptococcus neoformans/gattii NOT DETECTED NOT DETECTED Final    Comment: Performed at [facility], [address], [place], [place] [address]  Urine Culture     Status: None   Collection Time: 09/14/22  6:33 PM   Specimen:  Urine, Random  Result Value Ref Range Status   Specimen Description   Final    URINE, RANDOM Performed at [facility], [address], [place], [place] [address]    Special Requests   Final    NONE Reflexed from [id number] Performed at [facility], [address], [place], [place] [address]    Culture   Final    NO GROWTH Performed at [facility], [address] [place], [place] [address]    Report Status 09/15/2022 FINAL  Final  Resp panel by RT-PCR (RSV, Flu A&B, Covid) Anterior Nasal Swab     Status: None  Collection Time: 09/14/22  7:20 PM   Specimen: Anterior Nasal Swab  Result Value Ref Range Status   SARS Coronavirus 2 by RT PCR NEGATIVE NEGATIVE Final    Comment: (NOTE) SARS-CoV-2 target nucleic acids are NOT DETECTED.  The SARS-CoV-2 RNA is generally detectable in upper respiratory specimens during the acute phase of infection. The lowest concentration of SARS-CoV-2 viral copies this assay can detect is 138 copies/mL. A negative result does not preclude SARS-Cov-2 infection and should not be used as the sole basis for treatment or other patient management decisions. A negative result may occur with  improper specimen collection/handling, submission of specimen other than nasopharyngeal swab, presence of viral mutation(s) within the areas targeted by this assay, and inadequate number of viral copies(<138 copies/mL). A negative result must be combined with clinical observations, patient history, and epidemiological information. The expected result is Negative.  Fact Sheet for Patients:  BloggerCourse.com  Fact Sheet for Healthcare Providers:  SeriousBroker.it  This test is no t yet approved or cleared by the [place] FDA and  has been authorized for detection and/or diagnosis of SARS-CoV-2 by FDA under an Emergency Use Authorization (EUA). This EUA will remain  in  effect (meaning this test can be used) for the duration of the COVID-19 declaration under Section 564(b)(1) of the Act, 21 U.S.C.section 360bbb-3(b)(1), unless the authorization is terminated  or revoked sooner.       Influenza A by PCR NEGATIVE NEGATIVE Final   Influenza B by PCR NEGATIVE NEGATIVE Final    Comment: (NOTE) The Xpert Xpress SARS-CoV-2/FLU/RSV plus assay is intended as an aid in the diagnosis of influenza from Nasopharyngeal swab specimens and should not be used as a sole basis for treatment. Nasal washings and aspirates are unacceptable for Xpert Xpress SARS-CoV-2/FLU/RSV testing.  Fact Sheet for Patients: BloggerCourse.com  Fact Sheet for Healthcare Providers: SeriousBroker.it  This test is not yet approved or cleared by the [place] FDA and has been authorized for detection and/or diagnosis of SARS-CoV-2 by FDA under an Emergency Use Authorization (EUA). This EUA will remain in effect (meaning this test can be used) for the duration of the COVID-19 declaration under Section 564(b)(1) of the Act, 21 U.S.C. section 360bbb-3(b)(1), unless the authorization is terminated or revoked.     Resp Syncytial Virus by PCR NEGATIVE NEGATIVE Final    Comment: (NOTE) Fact Sheet for Patients: BloggerCourse.com  Fact Sheet for Healthcare Providers: SeriousBroker.it  This test is not yet approved or cleared by the [place] FDA and has been authorized for detection and/or diagnosis of SARS-CoV-2 by FDA under an Emergency Use Authorization (EUA). This EUA will remain in effect (meaning this test can be used) for the duration of the COVID-19 declaration under Section 564(b)(1) of the Act, 21 U.S.C. section 360bbb-3(b)(1), unless the authorization is terminated or revoked.  Performed at [facility], [address] [place], [place] [address]    Culture, blood (Routine x 2)     Status: None (Preliminary result)   Collection Time: 09/15/22  2:49 AM   Specimen: BLOOD  Result Value Ref Range Status   Specimen Description BLOOD BLOOD LEFT ARM  Final   Special Requests   Final    BOTTLES DRAWN AEROBIC AND ANAEROBIC Blood Culture adequate volume   Culture   Final    NO GROWTH 3 DAYS Performed at [facility], [address], [place], [place] [address]    Report Status PENDING  Incomplete  MRSA Next Gen by  PCR, Nasal     Status: None   Collection Time: 09/15/22  8:59 AM   Specimen: Nasal Mucosa; Nasal Swab  Result Value Ref Range Status   MRSA by PCR Next Gen NOT DETECTED NOT DETECTED Final    Comment: (NOTE) The GeneXpert MRSA Assay (FDA approved for NASAL specimens only), is one component of a comprehensive MRSA colonization surveillance program. It is not intended to diagnose MRSA infection nor to guide or monitor treatment for MRSA infections. Test performance is not FDA approved in patients less than 61 years old. Performed at [facility], [address] [place], [place] [address]   C Difficile Quick Screen w PCR reflex     Status: None   Collection Time: 09/15/22  7:29 PM   Specimen: STOOL  Result Value Ref Range Status   C Diff antigen NEGATIVE NEGATIVE Final   C Diff toxin NEGATIVE NEGATIVE Final   C Diff interpretation No C. difficile detected.  Final    Comment: Performed at [facility], [address] [place], [place] [address]  Culture, blood (Routine X 2) w Reflex to ID Panel     Status: None (Preliminary result)   Collection Time: 09/17/22  1:50 AM   Specimen: BLOOD RIGHT HAND  Result Value Ref Range Status   Specimen Description BLOOD RIGHT HAND  Final   Special Requests AEROBIC BOTTLE ONLY Blood Culture adequate volume  Final   Culture   Final    NO GROWTH 1 DAY Performed at [facility], [address], [place], [place] [address]    Report Status PENDING  Incomplete   Culture, blood (Routine X 2) w Reflex to ID Panel     Status: None (Preliminary result)   Collection Time: 09/17/22  1:52 AM   Specimen: BLOOD  Result Value Ref Range Status   Specimen Description BLOOD BLOOD RIGHT ARM  Final   Special Requests AEROBIC BOTTLE ONLY Blood Culture adequate volume  Final   Culture   Final    NO GROWTH 1 DAY Performed at [facility], [address], [place], [place] [address]    Report Status PENDING  Incomplete         Radiology Studies: No results found.      Scheduled Meds:  enoxaparin (LOVENOX) injection  60 mg Subcutaneous Q24H   fluticasone furoate-vilanterol  1 puff Inhalation Daily   And   umeclidinium bromide  1 puff Inhalation Daily   gabapentin  600 mg Oral QHS   guaiFENesin  600 mg Oral BID   insulin aspart  0-5 Units Subcutaneous QHS   insulin aspart  0-6 Units Subcutaneous TID WC   insulin glargine-yfgn  10 Units Subcutaneous QHS   montelukast  10 mg Oral QHS   pantoprazole  40 mg Oral Daily   saccharomyces boulardii  250 mg Oral BID   sertraline  50 mg Oral Daily   silver sulfADIAZINE   Topical Daily   simvastatin  40 mg Oral Q supper   sodium chloride flush  3 mL Intravenous Q12H   Continuous Infusions:  sodium chloride Stopped (09/15/22 1000)   cefTRIAXone (ROCEPHIN)  IV 2 g (09/18/22 0832)     LOS: 3 days    Time spent: 35 minutes    Barnetta Chapel, MD [facility]   If 7PM-7AM, please contact night-coverage [email or web address]  09/18/2022, 5:23 PM

## 2022-09-19 ENCOUNTER — Ambulatory Visit (HOSPITAL_COMMUNITY): Payer: Medicare Other

## 2022-09-19 ENCOUNTER — Other Ambulatory Visit: Payer: Self-pay | Admitting: Internal Medicine

## 2022-09-19 DIAGNOSIS — J189 Pneumonia, unspecified organism: Secondary | ICD-10-CM | POA: Diagnosis not present

## 2022-09-19 DIAGNOSIS — A419 Sepsis, unspecified organism: Secondary | ICD-10-CM | POA: Diagnosis not present

## 2022-09-19 LAB — GLUCOSE, CAPILLARY
Glucose-Capillary: 233 mg/dL — ABNORMAL HIGH (ref 70–99)
Glucose-Capillary: 256 mg/dL — ABNORMAL HIGH (ref 70–99)

## 2022-09-19 LAB — CULTURE, BLOOD (ROUTINE X 2)

## 2022-09-19 MED ORDER — LANTUS SOLOSTAR 100 UNIT/ML ~~LOC~~ SOPN: 10.0000 [IU] | PEN_INJECTOR | Freq: Every day | SUBCUTANEOUS | 1 refills | Status: AC

## 2022-09-19 MED ORDER — AMOXICILLIN 500 MG PO CAPS: 500.0000 mg | ORAL_CAPSULE | Freq: Three times a day (TID) | ORAL | 0 refills | Status: DC

## 2022-09-19 MED ORDER — GABAPENTIN 300 MG PO CAPS: 600.0000 mg | ORAL_CAPSULE | Freq: Every day | ORAL | 0 refills | Status: DC

## 2022-09-19 MED ORDER — AMOXICILLIN 500 MG PO CAPS: 500.0000 mg | ORAL_CAPSULE | Freq: Three times a day (TID) | ORAL | 0 refills | Status: AC

## 2022-09-19 MED ORDER — LANTUS SOLOSTAR 100 UNIT/ML ~~LOC~~ SOPN: 10.0000 [IU] | PEN_INJECTOR | Freq: Every day | SUBCUTANEOUS | 1 refills | Status: DC

## 2022-09-19 MED ORDER — BENICAR 20 MG PO TABS: 10.0000 mg | ORAL_TABLET | Freq: Every day | ORAL | 0 refills | Status: DC

## 2022-09-19 MED ORDER — SERTRALINE HCL 50 MG PO TABS: 50.0000 mg | ORAL_TABLET | Freq: Every day | ORAL | 0 refills | Status: DC

## 2022-09-19 MED ORDER — SACCHAROMYCES BOULARDII 250 MG PO CAPS: 250.0000 mg | ORAL_CAPSULE | Freq: Two times a day (BID) | ORAL | 0 refills | Status: AC

## 2022-09-19 MED ORDER — SACCHAROMYCES BOULARDII 250 MG PO CAPS: 250.0000 mg | ORAL_CAPSULE | Freq: Two times a day (BID) | ORAL | 0 refills | Status: DC

## 2022-09-19 NOTE — Plan of Care (Signed)
  Problem: Education: Goal: Knowledge of General Education information will improve Description: Including pain rating scale, medication(s)/side effects and non-pharmacologic comfort measures Outcome: Progressing   Problem: Health Behavior/Discharge Planning: Goal: Ability to manage health-related needs will improve Outcome: Progressing   Problem: Clinical Measurements: Goal: Ability to maintain clinical measurements within normal limits will improve Outcome: Progressing Goal: Will remain free from infection Outcome: Progressing Goal: Diagnostic test results will improve Outcome: Progressing Goal: Respiratory complications will improve Outcome: Progressing Goal: Cardiovascular complication will be avoided Outcome: Progressing   Problem: Activity: Goal: Risk for activity intolerance will decrease Outcome: Progressing   Problem: Nutrition: Goal: Adequate nutrition will be maintained Outcome: Progressing   Problem: Coping: Goal: Level of anxiety will decrease Outcome: Progressing   Problem: Elimination: Goal: Will not experience complications related to bowel motility Outcome: Progressing Goal: Will not experience complications related to urinary retention Outcome: Progressing   Problem: Pain Managment: Goal: General experience of comfort will improve Outcome: Progressing   Problem: Safety: Goal: Ability to remain free from injury will improve Outcome: Progressing   Problem: Skin Integrity: Goal: Risk for impaired skin integrity will decrease Outcome: Progressing   Problem: Education: Goal: Ability to describe self-care measures that may prevent or decrease complications (Diabetes Survival Skills Education) will improve Outcome: Progressing Goal: Individualized Educational Video(s) Outcome: Progressing   Problem: Coping: Goal: Ability to adjust to condition or change in health will improve Outcome: Progressing   Problem: Fluid Volume: Goal: Ability to  maintain a balanced intake and output will improve Outcome: Progressing   Problem: Health Behavior/Discharge Planning: Goal: Ability to identify and utilize available resources and services will improve Outcome: Progressing Goal: Ability to manage health-related needs will improve Outcome: Progressing   Problem: Metabolic: Goal: Ability to maintain appropriate glucose levels will improve Outcome: Progressing   Problem: Nutritional: Goal: Maintenance of adequate nutrition will improve Outcome: Progressing Goal: Progress toward achieving an optimal weight will improve Outcome: Progressing   Problem: Skin Integrity: Goal: Risk for impaired skin integrity will decrease Outcome: Progressing   Problem: Tissue Perfusion: Goal: Adequacy of tissue perfusion will improve Outcome: Progressing   Problem: Fluid Volume: Goal: Hemodynamic stability will improve Outcome: Progressing   Problem: Clinical Measurements: Goal: Diagnostic test results will improve Outcome: Progressing Goal: Signs and symptoms of infection will decrease Outcome: Progressing   Problem: Respiratory: Goal: Ability to maintain adequate ventilation will improve Outcome: Progressing   

## 2022-09-19 NOTE — Discharge Summary (Addendum)
Physician Discharge Summary  Patient ID: [name] MRN: [id number] DOB/AGE: 72-Sep-1952 72 y.o.  Admit date: 09/14/2022 Discharge date: 09/19/2022  Admission Diagnoses:  Discharge Diagnoses:  Principal Problem:   Sepsis due to pneumonia [facility] 2) Active Problems:   DM (diabetes mellitus) type 2   Obstructive sleep apnea   COPD (chronic obstructive pulmonary disease) with emphysema (HCC)   Insulin-requiring or dependent type II diabetes mellitus (HCC)   Rheumatoid arthritis (HCC)   Discharged Condition: stable  Hospital Course: Patient is a 72 year old female, morbidly obese, with past medical history significant for COPD, OSA on CPAP, rheumatoid arthritis, hypertension and insulin-dependent diabetes.  Patient presented with fever and shortness of breath.  On presentation to the emergency room, patient was found to be febrile, hypoxic oxygen saturation 88 on room air hypotensive systolic blood pressure 83.  White blood cell was 12 and lactic acid 2.8.  CT chest/abdomen/pelvis with contrast revealed large left lower lobe infiltrate and mild diffuse urinary bladder wall thickening.  Blood culture grew Streptococcus pneumonia.  Patient was admitted and managed for sepsis secondary to pneumonia and hypoxic respiratory failure.  Patient was initially managed with IV Rocephin.  Patient be transition to oral amoxicillin 500 Mg p.o. 3 times daily for the next 7 days (on discharge).   1-Sepsis secondary to pneumonia: -Patient presents with fever, SOB, Cough. CT chest:  new large infiltrate in left lower lobe suggesting pneumonia. There is interval subtle increase in interstitial markings in the lingula, lateral segment of right middle lobe and in right lower lobe suggesting possible interstitial pneumonia -She report 4 episodes of PNA last year. Speech therapist consulted. No evidence of aspiration.  -MRSA PCR. Negative --Blood culture: Growing Strep Pneumoniae.  -Continue with Ceftriaxone day 2. Would  treat for at least 10 days with  antibiotics.  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    Acute hypoxic respiratory failure secondary to pneumonia: Continue with oxygen supplementation.  Continue with inhaler. Albuterol Schedule.  Continue with  Guaifenesin.  Flutter valve.    COPD, OSA: Continue with Breo.  CPAP   Insulin-dependent Diabetes type 2 (DKA ruled out): Start Insulin while in the hospital.  Resume Trulicity metformin at discharge.    Rheumatoid arthritis: On methotrexate and Remicade at home.    Diarrhea; C diff negative Start Florastore.    Estimated body mass index is 47.74 kg/m as calculated from the following:   Height as of this encounter: 5\' 2"  (1.575 m).   Weight as of this encounter: 118.4 kg.      Consults: None  Significant Diagnostic Studies:  Blood culture grew: Streptococcus pneumonia (1 out of 2 bottles).   CT chest/abdomen/pelvis with contrast revealed: There is new large infiltrate in left lower lobe suggesting pneumonia. There is interval subtle increase in interstitial markings in the lingula, lateral segment of right middle lobe and in right lower lobe suggesting possible interstitial pneumonia. Part of this finding may suggest underlying scarring. There is no pleural effusion or pneumothorax.   There is 1 cm faint ground-glass density in left upper lobe which has not changed significantly. Coronary artery calcifications are seen. There are enlarged lymph nodes in mediastinum with no significant interval change suggesting possible reactive hyperplasia.   There is large ventral hernia in right flank containing much of the bowel loops. Some of the bowel loops in the lateral aspect of the  hernia are not included in the images limiting evaluation. Scattered diverticula are seen in the colon  without signs of focal diverticulitis.   There is no evidence of intestinal obstruction or pneumoperitoneum. There is no hydronephrosis.   There is small 2 mm calculus in the left kidney. Motion artifacts limit evaluation of upper pole of left kidney. There is mild diffuse wall thickening in the urinary bladder which may be due to incomplete distention or cystitis.   Possible 8 mm cyst in the spleen. Lumbar spondylosis with spinal stenosis and encroachment of neural foramina at multiple levels. There is mild anterolisthesis at the L4-L5 level.       Treatments: Patient was managed with IV Rocephin during the hospital stay.  Patient will complete course of antibiotics treatment using oral amoxicillin 500 Mg p.o. 3 times daily for the next 1 week.  Discharge Exam: Blood pressure 127/79, pulse 77, temperature 98.3 F (36.8 C), temperature source Oral, resp. rate 19, height 5\' 2"  (1.575 m), weight 118.4 kg, SpO2 98 %.   Disposition: Discharge disposition: 01-Home or Self Care       Discharge Instructions     Diet - low sodium heart healthy   Complete by: As directed    Diet Carb Modified   Complete by: As directed    Discharge wound care:   Complete by: As directed    Continue current wound care management   Increase activity slowly   Complete by: As directed       Allergies as of 09/19/2022       Reactions   Demerol Nausea And Vomiting   Severe   Hydromorphone Hcl Itching   All over the body.   Tetracycline Hives        Medication List     STOP taking these medications    furosemide 40 MG tablet Commonly known as: LASIX   gabapentin 600 MG tablet Commonly known as: NEURONTIN Replaced by: gabapentin 300 MG capsule   Gerhardt's butt cream Crea   Pfizer COVID-19 Vac Bivalent injection Generic drug: COVID-19 mRNA bivalent vaccine Proofreader)       TAKE these medications    acetaminophen 325 MG tablet Commonly known as: TYLENOL Take 650 mg by  mouth every 6 (six) hours as needed (for pain.).   albuterol (2.5 MG/3ML) 0.083% nebulizer solution Commonly known as: PROVENTIL Take 3 mLs (2.5 mg total) by nebulization every 6 (six) hours as needed for wheezing or shortness of breath (dx: J43.9). What changed: Another medication with the same name was changed. Make sure you understand how and when to take each.   albuterol 108 (90 Base) MCG/ACT inhaler Commonly known as: VENTOLIN HFA USE 2 INHALATIONS EVERY 6 HOURS AS NEEDED FOR WHEEZING OR SHORTNESS OF BREATH What changed: See the new instructions.   ALPRAZolam 0.5 MG tablet Commonly known as: XANAX Take 0.5 mg by mouth 3 (three) times daily as needed for sleep or anxiety.   amoxicillin 500 MG capsule Commonly known as: AMOXIL Take 1 capsule (500 mg total) by mouth 3 (three) times daily for 7 days.   Benicar 20 MG tablet Generic drug: olmesartan Take 0.5 tablets (10 mg total) by mouth daily. What changed: how much to take   diclofenac sodium 1 % Gel Commonly known as: VOLTAREN Apply 2-4 g topically 4 (four) times daily as needed for pain (Hand pain).   empagliflozin 25 MG Tabs tablet Commonly known as: JARDIANCE Take 25 mg by mouth daily.   fluticasone 50 MCG/ACT  nasal spray Commonly known as: FLONASE Place 1 spray into both nostrils daily.   folic acid 1 MG tablet Commonly known as: FOLVITE Take 1 mg by mouth daily.   gabapentin 300 MG capsule Commonly known as: NEURONTIN Take 2 capsules (600 mg total) by mouth at bedtime. Replaces: gabapentin 600 MG tablet   HumaLOG KwikPen 100 UNIT/ML KwikPen Generic drug: insulin lispro Inject 8-14 Units into the skin See admin instructions. Inject 8 units subcutaneously in the morning & inject 14 units subcutaneously at supper   IRON PO Take by mouth.   Lantus SoloStar 100 UNIT/ML Solostar Pen Generic drug: insulin glargine Inject 10 Units into the skin daily. Inject 50 units under the skin in the morning and 60 units  in the evening. What changed:  how much to take when to take this   loratadine 10 MG tablet Commonly known as: CLARITIN Take 10 mg by mouth daily.   Lubricant Eye Drops 0.5 % Soln Generic drug: carboxymethylcellulose Place 1 drop into both eyes 3 (three) times daily.   metFORMIN 500 MG 24 hr tablet Commonly known as: GLUCOPHAGE-XR Take 2,000 mg by mouth daily.   methotrexate 2.5 MG tablet Commonly known as: RHEUMATREX Take 2.5 mg by mouth once a week. Caution:Chemotherapy. Protect from light.   montelukast 10 MG tablet Commonly known as: SINGULAIR TAKE 1 TABLET AT BEDTIME   omeprazole 40 MG capsule Commonly known as: PRILOSEC TAKE 1 CAPSULE EVERY MORNING   REMICADE IV Inject 1 Dose into the vein every 6 (six) weeks.   saccharomyces boulardii 250 MG capsule Commonly known as: FLORASTOR Take 1 capsule (250 mg total) by mouth 2 (two) times daily for 21 days.   sertraline 50 MG tablet Commonly known as: ZOLOFT Take 1 tablet (50 mg total) by mouth daily. What changed: Another medication with the same name was removed. Continue taking this medication, and follow the directions you see here.   simvastatin 40 MG tablet Commonly known as: ZOCOR Take 40 mg by mouth daily with supper.   Trelegy Ellipta 100-62.5-25 MCG/ACT Aepb Generic drug: Fluticasone-Umeclidin-Vilant USE 1 INHALATION DAILY What changed: See the new instructions.   Trulicity 0.75 MG/0.5ML Sopn Generic drug: Dulaglutide Inject 0.75 mg into the skin once a week.               Discharge Care Instructions  (From admission, onward)           Start     Ordered   09/19/22 0000  Discharge wound care:       Comments: Continue current wound care management   09/19/22 1128            Time spent: 36 minutes.   SignedBarnetta Chapel 09/19/2022, 11:42 AM

## 2022-09-20 LAB — CULTURE, BLOOD (ROUTINE X 2)
Culture: NO GROWTH
Special Requests: ADEQUATE
Special Requests: ADEQUATE

## 2022-09-22 DIAGNOSIS — A419 Sepsis, unspecified organism: Secondary | ICD-10-CM | POA: Diagnosis not present

## 2022-09-22 DIAGNOSIS — J189 Pneumonia, unspecified organism: Secondary | ICD-10-CM | POA: Diagnosis not present

## 2022-09-22 DIAGNOSIS — E785 Hyperlipidemia, unspecified: Secondary | ICD-10-CM | POA: Diagnosis not present

## 2022-09-22 DIAGNOSIS — E1122 Type 2 diabetes mellitus with diabetic chronic kidney disease: Secondary | ICD-10-CM | POA: Diagnosis not present

## 2022-09-22 LAB — CULTURE, BLOOD (ROUTINE X 2): Culture: NO GROWTH

## 2022-09-26 ENCOUNTER — Ambulatory Visit (HOSPITAL_BASED_OUTPATIENT_CLINIC_OR_DEPARTMENT_OTHER): Payer: Medicare Other

## 2022-09-26 ENCOUNTER — Ambulatory Visit (INDEPENDENT_AMBULATORY_CARE_PROVIDER_SITE_OTHER): Payer: Medicare Other | Admitting: Pulmonary Disease

## 2022-09-26 ENCOUNTER — Encounter (HOSPITAL_BASED_OUTPATIENT_CLINIC_OR_DEPARTMENT_OTHER): Payer: Self-pay | Admitting: Pulmonary Disease

## 2022-09-26 VITALS — BP 122/74 | HR 76 | Ht 62.0 in | Wt 246.0 lb

## 2022-09-26 DIAGNOSIS — J449 Chronic obstructive pulmonary disease, unspecified: Secondary | ICD-10-CM | POA: Diagnosis not present

## 2022-09-26 DIAGNOSIS — Z8701 Personal history of pneumonia (recurrent): Secondary | ICD-10-CM

## 2022-09-26 DIAGNOSIS — G4733 Obstructive sleep apnea (adult) (pediatric): Secondary | ICD-10-CM | POA: Diagnosis not present

## 2022-09-26 DIAGNOSIS — R911 Solitary pulmonary nodule: Secondary | ICD-10-CM

## 2022-09-26 DIAGNOSIS — J189 Pneumonia, unspecified organism: Secondary | ICD-10-CM | POA: Diagnosis not present

## 2022-09-26 NOTE — Progress Notes (Signed)
[facility]  Chief Complaint  Patient presents with   Follow-up    F/U after CT and hospital visit. States she recently had PNA.      Constitutional:  BP 122/74   Pulse 76   Ht 5\' 2"  (1.575 m)   Wt 246 lb (111.6 kg)   SpO2 95% Comment: on RA  BMI 44.99 kg/m   Past Medical History:  RA, HTN, HLD, GERD, DM, Depression  Past Surgical History:  Her  has a past surgical history that includes Total knee arthroplasty (05/07/2010); Revision total knee arthroplasty (06/09/2010); Appendectomy (1967); Abdominal hysterectomy (1992); Cholecystectomy (1992); Aorto-pulmonary window repair (1992); Hernia repair (2001); Hernia repair (2007); Abdominal wall mesh  removal (08/13/2009); Foreign body removal abdominal (01/04/2010); Colonoscopy (12/08/2011); Bone biopsy (Right, 04/19/2016); Colonoscopy with propofol (N/A, 10/16/2017); polypectomy (10/16/2017); Esophagogastroduodenoscopy (egd) with propofol (N/A, 10/29/2018); Colonoscopy with propofol (N/A, 10/29/2018); polypectomy (10/29/2018); and biopsy (10/29/2018).  Brief Summary:  [name] is a 72 y.o. female former smoker with obstructive sleep apnea and COPD.      Subjective:   She was in hospital earlier this month for LLL pneumonia with pneumococcal bacteremia.  Feeling better.  Not much cough, wheeze, or chest congestion.  Energy level improving.  Physical Exam:   Appearance - well kempt   ENMT - no sinus tenderness, no oral exudate, no LAN, Mallampati 3 airway, no stridor  Respiratory - equal breath sounds bilaterally, no wheezing or rales  CV - s1s2 regular rate and rhythm, 2/6 SM  Ext - no clubbing, no edema  Skin - no rashes  Psych - normal mood and affect       Pulmonary testing:  PFT 09/18/07 >> FEV1 1.38 (61%), FEV1% 59, TLC 4.48 (95%), DLCO 80%, + BD PFT 08/09/17 >> FEV1 1.35 (61%), FEV1% 69, TLC 4.67 (98%), DLCO 97%  Chest Imaging:  HRCT chest 08/17/17 >> calcification RV,  atherosclerosis, scarring, mild air trapping, micronodularity LLL, 1.4 cm nodule LUL, fatty liver CT chest 11/29/17 >> LUL nodule resolved HRCT chest 09/11/19 >> mild basilar subpleural scarring, 1.4 x 1.1 area of GGO LUL CT chest 09/17/20 >> mild centrilobular emphysema, LUL GGO 1 x 1.2 cm (no change) CT chest 09/14/22 >> stable LUL GGO 10 mm, new LLL infiltrate with air bronchograms  Sleep Tests:  PSG 09/02/07 >> AHI 9 CPAP 09/19/21 to 10/18/21 >> used on 30 of 30 nights with average 7 hrs 55 min.  Average AHI 2.4 with CPAP 9 cm H2O   Cardiac Tests:  Echo 09/01/17 >> EF 65 to 70%, mild AS, severe MV calcification  Social History:  She  reports that she quit smoking about 30 years ago. Her smoking use included cigarettes. She has a 50.00 pack-year smoking history. She has never used smokeless tobacco. She reports that she does not drink alcohol and does not use drugs.  Family History:  Her family history includes Arthritis in her father; COPD in her father; Cancer in her mother; Diabetes in her father and mother; Hyperlipidemia in her brother and mother; Hypertension in her brother; Thyroid disease in her mother. She was adopted.     Assessment/Plan:   COPD with chronic bronchitis. - continue trelegy 100 one puff daily, singulair 10 mg at bedtime - prn mucinex, albuterol, flutter valve, chest percussion system when she has more cough and phlegm - she has a home nebulizer  Post nasal drip with upper airway cough. - continue nasal irrigation, flonase, singulair - prn  claritin   Obstructive sleep apnea. - she is compliant with CPAP and reports benefit - she uses [facility] [place] for her DME - current CPAP ordered May 2021 - continue CPAP 9 cm H2O   Lt upper lobe area of ground glass opacification. - follow up CT chest without contrast in June 2025  Pneumococcal left lower lobe pneumonia with bacteremia. - repeat chest xray today  Obesity. - discussed importance of weight  loss   Rheumatoid arthritis. - followed by Dr. Dierdre Forth with rheumatology - on MTX, remicade  Time Spent Involved in Patient Care on Day of Examination:  37 minutes  Follow up:   Patient Instructions  Chest xray today  Follow up in 6 months  Medication List:   Allergies as of 09/26/2022       Reactions   Demerol Nausea And Vomiting   Severe   Hydromorphone Hcl Itching   All over the body.   Tetracycline Hives        Medication List        Accurate as of September 26, 2022 10:24 AM. If you have any questions, ask your nurse or doctor.          acetaminophen 325 MG tablet Commonly known as: TYLENOL Take 650 mg by mouth every 6 (six) hours as needed (for pain.).   albuterol (2.5 MG/3ML) 0.083% nebulizer solution Commonly known as: PROVENTIL Take 3 mLs (2.5 mg total) by nebulization every 6 (six) hours as needed for wheezing or shortness of breath (dx: J43.9). What changed: Another medication with the same name was changed. Make sure you understand how and when to take each.   albuterol 108 (90 Base) MCG/ACT inhaler Commonly known as: VENTOLIN HFA USE 2 INHALATIONS EVERY 6 HOURS AS NEEDED FOR WHEEZING OR SHORTNESS OF BREATH What changed: See the new instructions.   ALPRAZolam 0.5 MG tablet Commonly known as: XANAX Take 0.5 mg by mouth 3 (three) times daily as needed for sleep or anxiety.   amoxicillin 500 MG capsule Commonly known as: AMOXIL Take 1 capsule (500 mg total) by mouth 3 (three) times daily for 7 days.   Benicar 20 MG tablet Generic drug: olmesartan Take 0.5 tablets (10 mg total) by mouth daily.   diclofenac sodium 1 % Gel Commonly known as: VOLTAREN Apply 2-4 g topically 4 (four) times daily as needed for pain (Hand pain).   empagliflozin 25 MG Tabs tablet Commonly known as: JARDIANCE Take 25 mg by mouth daily.   fluticasone 50 MCG/ACT nasal spray Commonly known as: FLONASE Place 1 spray into both nostrils daily.   folic acid 1 MG  tablet Commonly known as: FOLVITE Take 1 mg by mouth daily.   gabapentin 300 MG capsule Commonly known as: NEURONTIN Take 2 capsules (600 mg total) by mouth at bedtime.   HumaLOG KwikPen 100 UNIT/ML KwikPen Generic drug: insulin lispro Inject 8-14 Units into the skin See admin instructions. Inject 8 units subcutaneously in the morning & inject 14 units subcutaneously at supper   IRON PO Take by mouth.   Lantus SoloStar 100 UNIT/ML Solostar Pen Generic drug: insulin glargine Inject 10 Units into the skin daily. Inject 10 units into  the skin daily   loratadine 10 MG tablet Commonly known as: CLARITIN Take 10 mg by mouth daily.   Lubricant Eye Drops 0.5 % Soln Generic drug: carboxymethylcellulose Place 1 drop into both eyes 3 (three) times daily.   metFORMIN 500 MG 24 hr tablet Commonly known as: GLUCOPHAGE-XR Take 2,000  mg by mouth daily.   methotrexate 2.5 MG tablet Commonly known as: RHEUMATREX Take 2.5 mg by mouth once a week. Caution:Chemotherapy. Protect from light.   montelukast 10 MG tablet Commonly known as: SINGULAIR TAKE 1 TABLET AT BEDTIME   omeprazole 40 MG capsule Commonly known as: PRILOSEC TAKE 1 CAPSULE EVERY MORNING   REMICADE IV Inject 1 Dose into the vein every 6 (six) weeks.   saccharomyces boulardii 250 MG capsule Commonly known as: FLORASTOR Take 1 capsule (250 mg total) by mouth 2 (two) times daily for 21 days.   sertraline 50 MG tablet Commonly known as: ZOLOFT Take 1 tablet (50 mg total) by mouth daily.   simvastatin 40 MG tablet Commonly known as: ZOCOR Take 40 mg by mouth daily with supper.   Trelegy Ellipta 100-62.5-25 MCG/ACT Aepb Generic drug: Fluticasone-Umeclidin-Vilant USE 1 INHALATION DAILY What changed: See the new instructions.   Trulicity 0.75 MG/0.5ML Sopn Generic drug: Dulaglutide Inject 0.75 mg into the skin once a week.        Signature:  Coralyn Helling, MD [facility] Pager - [phone] 09/26/2022, 10:24 AM

## 2022-09-26 NOTE — Patient Instructions (Signed)
Chest xray today  Follow up in 6 months 

## 2022-09-27 DIAGNOSIS — Z471 Aftercare following joint replacement surgery: Secondary | ICD-10-CM | POA: Diagnosis not present

## 2022-09-27 DIAGNOSIS — M545 Low back pain, unspecified: Secondary | ICD-10-CM | POA: Diagnosis not present

## 2022-09-27 DIAGNOSIS — M79604 Pain in right leg: Secondary | ICD-10-CM | POA: Diagnosis not present

## 2022-09-27 DIAGNOSIS — Z96651 Presence of right artificial knee joint: Secondary | ICD-10-CM | POA: Diagnosis not present

## 2022-09-27 DIAGNOSIS — Z96652 Presence of left artificial knee joint: Secondary | ICD-10-CM | POA: Diagnosis not present

## 2022-10-03 DIAGNOSIS — M0589 Other rheumatoid arthritis with rheumatoid factor of multiple sites: Secondary | ICD-10-CM | POA: Diagnosis not present

## 2022-10-04 DIAGNOSIS — N3000 Acute cystitis without hematuria: Secondary | ICD-10-CM | POA: Diagnosis not present

## 2022-10-05 ENCOUNTER — Encounter (HOSPITAL_BASED_OUTPATIENT_CLINIC_OR_DEPARTMENT_OTHER): Payer: Self-pay | Admitting: Pulmonary Disease

## 2022-10-05 NOTE — Telephone Encounter (Signed)
Received a message from patient asking for her CXR results from 6/17.   Dr. Craige Cotta, can you please advise? Thanks!

## 2022-10-06 DIAGNOSIS — L7211 Pilar cyst: Secondary | ICD-10-CM | POA: Diagnosis not present

## 2022-10-06 DIAGNOSIS — L918 Other hypertrophic disorders of the skin: Secondary | ICD-10-CM | POA: Diagnosis not present

## 2022-10-06 NOTE — Telephone Encounter (Signed)
I spoke to patient over the phone and discussed chest xray findings.

## 2022-10-11 DIAGNOSIS — H353231 Exudative age-related macular degeneration, bilateral, with active choroidal neovascularization: Secondary | ICD-10-CM | POA: Diagnosis not present

## 2022-10-18 DIAGNOSIS — L97529 Non-pressure chronic ulcer of other part of left foot with unspecified severity: Secondary | ICD-10-CM | POA: Diagnosis not present

## 2022-10-18 DIAGNOSIS — B353 Tinea pedis: Secondary | ICD-10-CM | POA: Diagnosis not present

## 2022-10-18 DIAGNOSIS — E1142 Type 2 diabetes mellitus with diabetic polyneuropathy: Secondary | ICD-10-CM | POA: Diagnosis not present

## 2022-11-14 DIAGNOSIS — M0589 Other rheumatoid arthritis with rheumatoid factor of multiple sites: Secondary | ICD-10-CM | POA: Diagnosis not present

## 2022-11-29 DIAGNOSIS — H6091 Unspecified otitis externa, right ear: Secondary | ICD-10-CM | POA: Diagnosis not present

## 2022-12-06 DIAGNOSIS — H35033 Hypertensive retinopathy, bilateral: Secondary | ICD-10-CM | POA: Diagnosis not present

## 2022-12-06 DIAGNOSIS — H353133 Nonexudative age-related macular degeneration, bilateral, advanced atrophic without subfoveal involvement: Secondary | ICD-10-CM | POA: Diagnosis not present

## 2022-12-06 DIAGNOSIS — H353231 Exudative age-related macular degeneration, bilateral, with active choroidal neovascularization: Secondary | ICD-10-CM | POA: Diagnosis not present

## 2022-12-06 DIAGNOSIS — E119 Type 2 diabetes mellitus without complications: Secondary | ICD-10-CM | POA: Diagnosis not present

## 2022-12-06 DIAGNOSIS — H43813 Vitreous degeneration, bilateral: Secondary | ICD-10-CM | POA: Diagnosis not present

## 2022-12-19 ENCOUNTER — Encounter (HOSPITAL_BASED_OUTPATIENT_CLINIC_OR_DEPARTMENT_OTHER): Payer: Self-pay | Admitting: Pulmonary Disease

## 2022-12-19 DIAGNOSIS — J449 Chronic obstructive pulmonary disease, unspecified: Secondary | ICD-10-CM

## 2022-12-20 DIAGNOSIS — H353123 Nonexudative age-related macular degeneration, left eye, advanced atrophic without subfoveal involvement: Secondary | ICD-10-CM | POA: Diagnosis not present

## 2022-12-20 DIAGNOSIS — B351 Tinea unguium: Secondary | ICD-10-CM | POA: Diagnosis not present

## 2022-12-20 DIAGNOSIS — E1142 Type 2 diabetes mellitus with diabetic polyneuropathy: Secondary | ICD-10-CM | POA: Diagnosis not present

## 2022-12-20 NOTE — Telephone Encounter (Signed)
Patient needs a local DME company. Last seen 09/26/22. Ok to order a new CPAP?

## 2022-12-20 NOTE — Telephone Encounter (Signed)
Okay to send order. 

## 2022-12-22 DIAGNOSIS — E1129 Type 2 diabetes mellitus with other diabetic kidney complication: Secondary | ICD-10-CM | POA: Diagnosis not present

## 2022-12-26 DIAGNOSIS — M0589 Other rheumatoid arthritis with rheumatoid factor of multiple sites: Secondary | ICD-10-CM | POA: Diagnosis not present

## 2022-12-26 DIAGNOSIS — Z111 Encounter for screening for respiratory tuberculosis: Secondary | ICD-10-CM | POA: Diagnosis not present

## 2022-12-26 DIAGNOSIS — R5383 Other fatigue: Secondary | ICD-10-CM | POA: Diagnosis not present

## 2022-12-26 DIAGNOSIS — Z79899 Other long term (current) drug therapy: Secondary | ICD-10-CM | POA: Diagnosis not present

## 2022-12-28 DIAGNOSIS — I10 Essential (primary) hypertension: Secondary | ICD-10-CM | POA: Diagnosis not present

## 2022-12-28 DIAGNOSIS — E1122 Type 2 diabetes mellitus with diabetic chronic kidney disease: Secondary | ICD-10-CM | POA: Diagnosis not present

## 2022-12-29 ENCOUNTER — Telehealth: Payer: Self-pay | Admitting: Pulmonary Disease

## 2022-12-29 DIAGNOSIS — J449 Chronic obstructive pulmonary disease, unspecified: Secondary | ICD-10-CM

## 2022-12-29 NOTE — Telephone Encounter (Signed)
Faxed 9/13 to [facility]

## 2022-12-29 NOTE — Telephone Encounter (Signed)
Patient contacted [place] through mychart about getting cpap supplies. she needs CMN sent to [facility] or sent somewhere in [place].

## 2023-01-04 ENCOUNTER — Encounter: Payer: Self-pay | Admitting: Internal Medicine

## 2023-01-04 ENCOUNTER — Ambulatory Visit: Payer: Medicare Other

## 2023-01-04 ENCOUNTER — Ambulatory Visit (INDEPENDENT_AMBULATORY_CARE_PROVIDER_SITE_OTHER): Payer: Medicare Other | Admitting: Internal Medicine

## 2023-01-04 VITALS — BP 112/48 | HR 85 | Ht 62.0 in | Wt 251.0 lb

## 2023-01-04 DIAGNOSIS — R509 Fever, unspecified: Secondary | ICD-10-CM | POA: Diagnosis not present

## 2023-01-04 DIAGNOSIS — R051 Acute cough: Secondary | ICD-10-CM | POA: Diagnosis not present

## 2023-01-04 DIAGNOSIS — R918 Other nonspecific abnormal finding of lung field: Secondary | ICD-10-CM | POA: Diagnosis not present

## 2023-01-04 DIAGNOSIS — R52 Pain, unspecified: Secondary | ICD-10-CM

## 2023-01-04 DIAGNOSIS — J449 Chronic obstructive pulmonary disease, unspecified: Secondary | ICD-10-CM

## 2023-01-04 DIAGNOSIS — R0602 Shortness of breath: Secondary | ICD-10-CM | POA: Diagnosis not present

## 2023-01-04 MED ORDER — AZITHROMYCIN 250 MG PO TABS: ORAL_TABLET | ORAL | 0 refills | Status: DC

## 2023-01-04 MED ORDER — BREZTRI AEROSPHERE 160-9-4.8 MCG/ACT IN AERO: 2.0000 | INHALATION_SPRAY | Freq: Two times a day (BID) | RESPIRATORY_TRACT | Status: DC

## 2023-01-04 NOTE — Telephone Encounter (Signed)
Patient states [facility] says they did not receive an order for CPAP supplies. She asked to please make sure it goes to [facility] and NOT [facility].   She is also asking for neb supplies order to be sent. This has been placed.

## 2023-01-04 NOTE — Patient Instructions (Addendum)
Plan A = Automatic = Always=    stop trelegy and start Breztri Take 2 puffs first thing in am and then another 2 puffs about 12 hours later.    Work on inhaler technique:  relax and gently blow all the way out then take a nice smooth full deep breath back in, triggering the inhaler at same time you start breathing in.  Hold breath in for at least  5 seconds if you can. Blow out breztri  thru nose. Rinse and gargle with water when done.  If mouth or throat bother you at all,  try brushing teeth/gums/tongue with arm and hammer toothpaste/ make a slurry and gargle and spit out.      Plan B = Backup (to supplement plan A, not to replace it) Only use your albuterol inhaler as a rescue medication to be used if you can't catch your breath by resting or doing a relaxed purse lip breathing pattern.  - The less you use it, the better it will work when you need it. - Ok to use the inhaler up to 2 puffs  every 4 hours if you must but call for appointment if use goes up over your usual need - Don't leave home without it !!  (think of it like the spare tire for your car)   Plan C = Crisis (instead of Plan B but only if Plan B stops working) - only use your albuterol nebulizer if you first try Plan B and it fails to help > ok to use the nebulizer up to every 4 hours but if start needing it regularly call for immediate appointment    Zpak   Please remember to go to the  x-ray department  for your tests - we will call you with the results when they are available    Follow up with Buelah Manis NP in 2 weeks

## 2023-01-04 NOTE — Progress Notes (Unsigned)
[name], female    DOB: 1950/06/11   MRN: [id number]   Brief patient profile:  72 yowf  quit smoking in 1994  with RA  self- referred to pulmonary clinic 01/04/2023   re acute fever/aches with neg covid testing 01/04/2023         History of Present Illness  01/04/2023  Pulmonary/ ACUTE office eval/[facility]or Sgro on Trelegy  Chief Complaint  Patient presents with   Cough    Recent productive cough, fever 101 last night, body aches; has not taken COVID test  Dyspnea:  walks with dogs x up to 2 miles using  Cough: x years, worse during the day > dark yellow/ mucinex dm  Sleep: on cpap does fine flat SABA use: hfa sev days a day / not neb typically but increased x 5pm 01/03/23    No obvious day to day or daytime pattern/variability or assoc mucus plugs or hemoptysis or cp or chest tightness, subjective wheeze or overt   hb symptoms.    Also denies any obvious fluctuation of symptoms with weather or environmental changes or other aggravating or alleviating factors except as outlined above   No unusual exposure hx or h/o childhood pna/ asthma or knowledge of premature birth.  Current Allergies, Complete Past Medical History, Past Surgical History, Family History, and Social History were reviewed in [employer] record.  ROS  The following are not active complaints unless bolded Hoarseness, sore throat, dysphagia, dental problems, itching, sneezing,  nasal congestion or discharge of excess mucus or purulent secretions, ear ache,   fever, chills, sweats, unintended wt loss or wt gain, classically pleuritic or exertional cp,  orthopnea pnd or arm/hand swelling  or leg swelling, presyncope, palpitations, abdominal pain, anorexia, nausea, vomiting, diarrhea  or change in bowel habits or change in bladder habits, change in stools or change in urine, dysuria, hematuria,  rash, arthralgias, visual complaints, headache generalized, numbness, weakness or ataxia or problems with walking or  coordination,  change in mood or  memory.            Outpatient Medications Prior to Visit  Medication Sig Dispense Refill   acetaminophen (TYLENOL) 325 MG tablet Take 650 mg by mouth every 6 (six) hours as needed (for pain.).     albuterol (PROVENTIL) (2.5 MG/3ML) 0.083% nebulizer solution Take 3 mLs (2.5 mg total) by nebulization every 6 (six) hours as needed for wheezing or shortness of breath (dx: J43.9). 360 mL 5   albuterol (VENTOLIN HFA) 108 (90 Base) MCG/ACT inhaler USE 2 INHALATIONS EVERY 6 HOURS AS NEEDED FOR WHEEZING OR SHORTNESS OF BREATH (Patient taking differently: Inhale 2 puffs into the lungs every 6 (six) hours as needed for shortness of breath.) 25.5 g 3   ALPRAZolam (XANAX) 0.5 MG tablet Take 0.5 mg by mouth 3 (three) times daily as needed for sleep or anxiety.      carboxymethylcellulose (LUBRICANT EYE DROPS) 0.5 % SOLN Place 1 drop into both eyes 3 (three) times daily.     diclofenac sodium (VOLTAREN) 1 % GEL Apply 2-4 g topically 4 (four) times daily as needed for pain (Hand pain).     empagliflozin (JARDIANCE) 25 MG TABS tablet Take 25 mg by mouth daily.     Ferrous Sulfate (IRON PO) Take by mouth.     fluticasone (FLONASE) 50 MCG/ACT nasal spray Place 1 spray into both nostrils daily. 48 g 3   folic acid (FOLVITE) 1 MG tablet Take 1 mg by mouth daily.  gabapentin (NEURONTIN) 600 MG tablet Take 600 mg by mouth daily.     HUMALOG KWIKPEN 100 UNIT/ML KwikPen Inject 8-14 Units into the skin See admin instructions. Inject 8 units subcutaneously in the morning & inject 14 units subcutaneously at supper     InFLIXimab (REMICADE IV) Inject 1 Dose into the vein every 6 (six) weeks.      LANTUS SOLOSTAR 100 UNIT/ML Solostar Pen Inject 10 Units into the skin daily. Inject 10 units into  the skin daily 15 mL 1   loratadine (CLARITIN) 10 MG tablet Take 10 mg by mouth daily.     meloxicam (MOBIC) 15 MG tablet Take 15 mg by mouth daily.     metFORMIN (GLUCOPHAGE-XR) 500 MG 24 hr  tablet Take 2,000 mg by mouth daily.     methotrexate (RHEUMATREX) 2.5 MG tablet Take 2.5 mg by mouth once a week. Caution:Chemotherapy. Protect from light.     montelukast (SINGULAIR) 10 MG tablet TAKE 1 TABLET AT BEDTIME (Patient taking differently: Take 10 mg by mouth at bedtime.) 90 tablet 3   omeprazole (PRILOSEC) 40 MG capsule TAKE 1 CAPSULE EVERY MORNING 90 capsule 3   sertraline (ZOLOFT) 100 MG tablet Take 100 mg by mouth daily.     simvastatin (ZOCOR) 40 MG tablet Take 40 mg by mouth daily with supper.     tiZANidine (ZANAFLEX) 4 MG tablet Take 4 mg by mouth every 8 (eight) hours as needed.     TRELEGY ELLIPTA 100-62.5-25 MCG/ACT AEPB USE 1 INHALATION DAILY (Patient taking differently: Take 1 puff by mouth daily.) 360 each 3   TRULICITY 0.75 MG/0.5ML SOPN Inject 0.75 mg into the skin once a week.     BENICAR 20 MG tablet Take 0.5 tablets (10 mg total) by mouth daily. 15 tablet 0   No facility-administered medications prior to visit.    Past Medical History:  Diagnosis Date   Asthma    COPD (chronic obstructive pulmonary disease) (HCC)    Depression    DM type 1 (diabetes mellitus, type 1) (HCC)    GERD (gastroesophageal reflux disease)    Hyperlipidemia    Hypertension    Morbid obesity (HCC)    Obstructive sleep apnea syndrome    Osteoarthritis of right knee 04/2010   end-stage   PONV (postoperative nausea and vomiting)    Rheumatoid arthritis(714.0)    on methotrexate therapy   Septic arthritis of knee, right (HCC) 04/2010      Objective:     BP (!) 112/48   Pulse 85   Ht 5\' 2"  (1.575 m)   Wt 251 lb (113.9 kg)   SpO2 93%   BMI 45.91 kg/m   Vital signs reviewed  01/04/2023  - Note at rest 02 sats  93% on RA   General appearance:    non - toxic MO (by BMI) amb wf nad     HEENT : facemask in place   NECK :  without  apparent JVD/ palpable Nodes/TM    LUNGS: no acc muscle use,  Nl contour chest with minimal insp/ exp rhonchi bilaterally without cough on  insp or exp maneuvers   CV:  RRR  no s3 or murmur or increase in P2, and trace pitting edema both LEs  ABD:  obese soft and nontender    MS:  Nl gait/ ext warm without deformities Or obvious joint restrictions  calf tenderness, cyanosis or clubbing    SKIN: warm and dry without lesions    NEURO:  alert, approp, nl sensorium with  no motor or cerebellar deficits apparent.      CXR PA and Lateral:   01/04/2023 :    I personally reviewed images and impression is as follows:     Non specific changes R base improved aeration L base vs priors no def as dz     Assessment   COPD GOLD 2/ AB in RA pt Quit smoking in 1994 with RA  - Spirometry  08/08/17  FEV1 1.35 (61%)  Ratio 0.69 with classic concave f/v  - 01/04/2023 changed trelegy to breztri x 2 week samples due to refractory chronic cough    - The proper method of use, as well as anticipated side effects, of a metered-dose inhaler were discussed and demonstrated to the patient using teach back method.      Group D (now reclassified as E) in terms of symptom/risk and laba/lama/ICS  therefore appropriate rx at this point >>>  breztri preferred at least on a trial basis due to chronic cough and presently flaring but with minimal AB component  in setting of likely viral uri with sputum already purulent so rec  Zpak to cover secondary bacterial infection since she is immunocompromised  Mucinex dm up to 1200 mg bid prn   F/u 2 weeks with NP - call sooner prn          Each maintenance medication was reviewed in detail including emphasizing most importantly the difference between maintenance and prns and under what circumstances the prns are to be triggered using an action plan format where appropriate.  Total time for H and P, chart review, counseling, reviewing hfa/neb device(s) and generating customized AVS unique to this office visit / same day charting > 40 min with pt not seen by me in > 3             Sandrea Hughs, MD 01/04/2023

## 2023-01-05 ENCOUNTER — Encounter: Payer: Self-pay | Admitting: Internal Medicine

## 2023-01-05 DIAGNOSIS — G4733 Obstructive sleep apnea (adult) (pediatric): Secondary | ICD-10-CM

## 2023-01-05 NOTE — Assessment & Plan Note (Addendum)
Quit smoking in 1994 with RA  - Spirometry  08/08/17  FEV1 1.35 (61%)  Ratio 0.69 with classic concave f/v  - 01/04/2023 changed trelegy to breztri x 2 week samples due to refractory chronic cough    - The proper method of use, as well as anticipated side effects, of a metered-dose inhaler were discussed and demonstrated to the patient using teach back method.      Group D (now reclassified as E) in terms of symptom/risk and laba/lama/ICS  therefore appropriate rx at this point >>>  breztri preferred at least on a trial basis due to chronic cough and presently flaring but with minimal AB component  in setting of likely viral uri with sputum already purulent so rec  Zpak to cover secondary bacterial infection since she is immunocompromised  Mucinex dm up to 1200 mg bid prn   F/u 2 weeks with NP - call sooner prn          Each maintenance medication was reviewed in detail including emphasizing most importantly the difference between maintenance and prns and under what circumstances the prns are to be triggered using an action plan format where appropriate.  Total time for H and P, chart review, counseling, reviewing hfa/neb device(s) and generating customized AVS unique to this ACUTE  office visit / same day charting > 40 min with pt not seen by me in > 3 years

## 2023-01-09 ENCOUNTER — Telehealth: Payer: Self-pay | Admitting: Pulmonary Disease

## 2023-01-09 NOTE — Telephone Encounter (Signed)
Patient needs a refill on Breztri (90day supply) with a [facility]. Patient also needs CPAP supplies but do not send to [facility].

## 2023-01-10 MED ORDER — BREZTRI AEROSPHERE 160-9-4.8 MCG/ACT IN AERO: 2.0000 | INHALATION_SPRAY | Freq: Two times a day (BID) | RESPIRATORY_TRACT | 3 refills | Status: DC

## 2023-01-11 NOTE — Telephone Encounter (Addendum)
Synetta Fail can you send over her CPAP supply and Neb order to a DME in [place]? Thank you!!!

## 2023-01-11 NOTE — Telephone Encounter (Signed)
When the order is signed I will be sending to [facility]

## 2023-01-11 NOTE — Telephone Encounter (Signed)
[facility] new order for CPAP supplies and I am sending to [facility]

## 2023-01-11 NOTE — Addendum Note (Signed)
Addended by: Bonney Leitz on: 01/11/2023 08:18 AM   Modules accepted: Orders

## 2023-01-12 NOTE — Telephone Encounter (Signed)
90 day supply of breztri sent to pharmacy 10/1. Left detailed message for patient.  Nothing further needed.

## 2023-01-16 ENCOUNTER — Other Ambulatory Visit: Payer: Self-pay | Admitting: Internal Medicine

## 2023-01-16 DIAGNOSIS — R102 Pelvic and perineal pain: Secondary | ICD-10-CM | POA: Diagnosis not present

## 2023-01-16 DIAGNOSIS — R8289 Other abnormal findings on cytological and histological examination of urine: Secondary | ICD-10-CM | POA: Diagnosis not present

## 2023-01-16 DIAGNOSIS — R300 Dysuria: Secondary | ICD-10-CM | POA: Diagnosis not present

## 2023-01-16 DIAGNOSIS — Z1231 Encounter for screening mammogram for malignant neoplasm of breast: Secondary | ICD-10-CM

## 2023-01-16 DIAGNOSIS — R3915 Urgency of urination: Secondary | ICD-10-CM | POA: Diagnosis not present

## 2023-01-17 DIAGNOSIS — H35033 Hypertensive retinopathy, bilateral: Secondary | ICD-10-CM | POA: Diagnosis not present

## 2023-01-17 DIAGNOSIS — E119 Type 2 diabetes mellitus without complications: Secondary | ICD-10-CM | POA: Diagnosis not present

## 2023-01-17 DIAGNOSIS — H43813 Vitreous degeneration, bilateral: Secondary | ICD-10-CM | POA: Diagnosis not present

## 2023-01-17 DIAGNOSIS — H353231 Exudative age-related macular degeneration, bilateral, with active choroidal neovascularization: Secondary | ICD-10-CM | POA: Diagnosis not present

## 2023-01-17 DIAGNOSIS — H353133 Nonexudative age-related macular degeneration, bilateral, advanced atrophic without subfoveal involvement: Secondary | ICD-10-CM | POA: Diagnosis not present

## 2023-01-24 ENCOUNTER — Telehealth: Payer: Self-pay

## 2023-01-24 DIAGNOSIS — E1142 Type 2 diabetes mellitus with diabetic polyneuropathy: Secondary | ICD-10-CM | POA: Diagnosis not present

## 2023-01-24 DIAGNOSIS — B351 Tinea unguium: Secondary | ICD-10-CM | POA: Diagnosis not present

## 2023-01-24 DIAGNOSIS — M79672 Pain in left foot: Secondary | ICD-10-CM | POA: Diagnosis not present

## 2023-01-24 DIAGNOSIS — L97511 Non-pressure chronic ulcer of other part of right foot limited to breakdown of skin: Secondary | ICD-10-CM | POA: Diagnosis not present

## 2023-01-24 NOTE — Telephone Encounter (Signed)
-----   Message from Sandrea Hughs sent at 01/24/2023 11:56 AM EDT ----- Call pt:  Reviewed cxr and may have a touch of infection on L side but zpak should have been enough - let me know if not feeling a lot better if not may need a much stronger abx

## 2023-01-24 NOTE — Telephone Encounter (Signed)
I left a message for the patient to return my call.

## 2023-02-06 DIAGNOSIS — M0589 Other rheumatoid arthritis with rheumatoid factor of multiple sites: Secondary | ICD-10-CM | POA: Diagnosis not present

## 2023-02-14 DIAGNOSIS — H353231 Exudative age-related macular degeneration, bilateral, with active choroidal neovascularization: Secondary | ICD-10-CM | POA: Diagnosis not present

## 2023-02-14 DIAGNOSIS — H353133 Nonexudative age-related macular degeneration, bilateral, advanced atrophic without subfoveal involvement: Secondary | ICD-10-CM | POA: Diagnosis not present

## 2023-02-14 DIAGNOSIS — E119 Type 2 diabetes mellitus without complications: Secondary | ICD-10-CM | POA: Diagnosis not present

## 2023-02-14 DIAGNOSIS — H35033 Hypertensive retinopathy, bilateral: Secondary | ICD-10-CM | POA: Diagnosis not present

## 2023-02-14 DIAGNOSIS — H43813 Vitreous degeneration, bilateral: Secondary | ICD-10-CM | POA: Diagnosis not present

## 2023-02-17 ENCOUNTER — Ambulatory Visit
Admission: RE | Admit: 2023-02-17 | Discharge: 2023-02-17 | Disposition: A | Discharge status: Home / Self Care | Payer: Medicare Other | Source: Ambulatory Visit | Attending: Internal Medicine | Admitting: Internal Medicine

## 2023-02-17 DIAGNOSIS — Z1231 Encounter for screening mammogram for malignant neoplasm of breast: Secondary | ICD-10-CM

## 2023-02-22 ENCOUNTER — Other Ambulatory Visit: Payer: Self-pay | Admitting: Internal Medicine

## 2023-02-22 DIAGNOSIS — R928 Other abnormal and inconclusive findings on diagnostic imaging of breast: Secondary | ICD-10-CM

## 2023-02-26 ENCOUNTER — Encounter: Payer: Self-pay | Admitting: Internal Medicine

## 2023-02-27 ENCOUNTER — Other Ambulatory Visit: Payer: Self-pay

## 2023-02-27 DIAGNOSIS — L299 Pruritus, unspecified: Secondary | ICD-10-CM | POA: Diagnosis not present

## 2023-02-27 DIAGNOSIS — M0589 Other rheumatoid arthritis with rheumatoid factor of multiple sites: Secondary | ICD-10-CM | POA: Diagnosis not present

## 2023-02-27 DIAGNOSIS — J449 Chronic obstructive pulmonary disease, unspecified: Secondary | ICD-10-CM

## 2023-02-27 DIAGNOSIS — Z6841 Body Mass Index (BMI) 40.0 and over, adult: Secondary | ICD-10-CM | POA: Diagnosis not present

## 2023-02-27 DIAGNOSIS — B372 Candidiasis of skin and nail: Secondary | ICD-10-CM | POA: Diagnosis not present

## 2023-02-27 DIAGNOSIS — M1991 Primary osteoarthritis, unspecified site: Secondary | ICD-10-CM | POA: Diagnosis not present

## 2023-02-27 DIAGNOSIS — M545 Low back pain, unspecified: Secondary | ICD-10-CM | POA: Diagnosis not present

## 2023-02-27 DIAGNOSIS — Z79899 Other long term (current) drug therapy: Secondary | ICD-10-CM | POA: Diagnosis not present

## 2023-02-27 DIAGNOSIS — R051 Acute cough: Secondary | ICD-10-CM

## 2023-02-27 MED ORDER — FLUTICASONE PROPIONATE 50 MCG/ACT NA SUSP
1.0000 | Freq: Every day | NASAL | 3 refills | Status: DC
Start: 2023-02-27 — End: 2024-03-04

## 2023-02-27 MED ORDER — MONTELUKAST SODIUM 10 MG PO TABS
10.0000 mg | ORAL_TABLET | Freq: Every day | ORAL | 3 refills | Status: DC
Start: 2023-02-27 — End: 2024-02-06

## 2023-02-28 DIAGNOSIS — L84 Corns and callosities: Secondary | ICD-10-CM | POA: Diagnosis not present

## 2023-02-28 DIAGNOSIS — E1142 Type 2 diabetes mellitus with diabetic polyneuropathy: Secondary | ICD-10-CM | POA: Diagnosis not present

## 2023-02-28 DIAGNOSIS — B351 Tinea unguium: Secondary | ICD-10-CM | POA: Diagnosis not present

## 2023-03-13 ENCOUNTER — Ambulatory Visit
Admission: RE | Admit: 2023-03-13 | Discharge: 2023-03-13 | Disposition: A | Discharge status: Home / Self Care | Payer: Medicare Other | Source: Ambulatory Visit | Attending: Internal Medicine | Admitting: Internal Medicine

## 2023-03-13 ENCOUNTER — Ambulatory Visit: Payer: Medicare Other

## 2023-03-13 DIAGNOSIS — R928 Other abnormal and inconclusive findings on diagnostic imaging of breast: Secondary | ICD-10-CM | POA: Diagnosis not present

## 2023-03-20 DIAGNOSIS — Z79899 Other long term (current) drug therapy: Secondary | ICD-10-CM | POA: Diagnosis not present

## 2023-03-20 DIAGNOSIS — M0589 Other rheumatoid arthritis with rheumatoid factor of multiple sites: Secondary | ICD-10-CM | POA: Diagnosis not present

## 2023-03-20 DIAGNOSIS — R5383 Other fatigue: Secondary | ICD-10-CM | POA: Diagnosis not present

## 2023-03-22 ENCOUNTER — Ambulatory Visit: Payer: Medicare Other | Admitting: Primary Care

## 2023-03-22 ENCOUNTER — Encounter: Payer: Self-pay | Admitting: Primary Care

## 2023-03-22 VITALS — BP 128/60 | HR 85 | Temp 98.6°F | Ht 62.0 in | Wt 256.8 lb

## 2023-03-22 DIAGNOSIS — G4730 Sleep apnea, unspecified: Secondary | ICD-10-CM

## 2023-03-22 DIAGNOSIS — Z6841 Body Mass Index (BMI) 40.0 and over, adult: Secondary | ICD-10-CM

## 2023-03-22 DIAGNOSIS — E669 Obesity, unspecified: Secondary | ICD-10-CM | POA: Diagnosis not present

## 2023-03-22 DIAGNOSIS — J4489 Other specified chronic obstructive pulmonary disease: Secondary | ICD-10-CM

## 2023-03-22 MED ORDER — AMOXICILLIN-POT CLAVULANATE 875-125 MG PO TABS: 1.0000 | ORAL_TABLET | Freq: Two times a day (BID) | ORAL | 0 refills | Status: DC

## 2023-03-22 NOTE — Progress Notes (Signed)
@Patient  ID: [name], female    DOB: 07-11-1950, 72 y.o.   MRN: [id number]  Chief Complaint  Patient presents with   Follow-up    [name] helps with cough.     Referring provider: Carylon Perches, MD  HPI: 72 year old female, former smoker. PMH significant for COPD, OSA, diabetes, RA, cardiac murmur, obesity. Former Dr. Craige Cotta patient   03/22/2023 Discussed the use of AI scribe software for clinical note transcription with the patient, who gave verbal consent to proceed.  History of Present Illness   The patient, primarily followed by Dr. Craige Cotta for COPD, reports improved respiratory symptoms since starting Breztri. She notes a significant reduction in cough and sputum production, attributing the current increase in green sputum to a change in weather. Despite this, she has not been utilizing his chest percussion system, the vest, but has been using the flutter valve and Mucinex. The patient reports the onset of increased sputum production to be last week.  In addition to COPD, the patient is also being managed for sleep apnea with a CPAP machine. She reports nightly compliance but is experiencing issues with mask fit. The patient attributes this to recent weight loss and plans to switch back to a previous mask type with a new DME company [facility] at the end of the month. Compliance report in [facility] is no up to date, she will bring machine by DME for download and we will follow-up in 4-6 weeks virtually. The patient has not had a sleep study since 2009.  The patient also expresses interest in the [facility] device for sleep apnea management but currently does not meet the BMI qualification. She is considering weight loss to become a candidate for the device.      Allergies  Allergen Reactions   Demerol Nausea And Vomiting    Severe   Hydromorphone Hcl Itching    All over the body.   Tetracycline Hives    Immunization History  Administered Date(s) Administered   Fluad Quad(high Dose  65+) 01/06/2020   Influenza Split 01/10/2012, 01/05/2013, 01/09/2017   Influenza Whole 01/31/2009, 01/09/2010, 01/09/2011   Influenza, High Dose Seasonal PF 12/28/2017   Influenza,inj,Quad PF,6+ Mos 12/10/2013, 12/23/2015   Influenza-Unspecified 12/26/2014, 01/17/2017, 12/22/2018, 02/04/2021   PFIZER Comirnaty(Gray Top)Covid-19 Tri-Sucrose Vaccine 08/04/2020   PFIZER(Purple Top)SARS-COV-2 Vaccination 05/25/2019, 06/17/2019, 12/10/2019, 02/14/2021   Pfizer Covid-19 Vaccine Bivalent Booster 52yrs & up 09/14/2021   Pneumococcal Conjugate-13 11/10/2015   Pneumococcal Polysaccharide-23 02/17/2008, 02/23/2017   Tetanus 03/30/2021   Zoster, Live 02/24/2021, 05/20/2021    Past Medical History:  Diagnosis Date   Asthma    COPD (chronic obstructive pulmonary disease) (HCC)    Depression    DM type 1 (diabetes mellitus, type 1) (HCC)    GERD (gastroesophageal reflux disease)    Hyperlipidemia    Hypertension    Morbid obesity (HCC)    Obstructive sleep apnea syndrome    Osteoarthritis of right knee 04/2010   end-stage   PONV (postoperative nausea and vomiting)    Rheumatoid arthritis(714.0)    on methotrexate therapy   Septic arthritis of knee, right (HCC) 04/2010    Tobacco History: Social History   Tobacco Use  Smoking Status Former   Current packs/day: 0.00   Average packs/day: 2.0 packs/day for 25.0 years (50.0 ttl pk-yrs)   Types: Cigarettes   Start date: 04/12/1967   Quit date: 04/11/1992   Years since quitting: 30.9  Smokeless Tobacco Never   Counseling given: Not Answered  Outpatient Medications Prior to Visit  Medication Sig Dispense Refill   acetaminophen (TYLENOL) 325 MG tablet Take 650 mg by mouth every 6 (six) hours as needed (for pain.).     albuterol (PROVENTIL) (2.5 MG/3ML) 0.083% nebulizer solution Take 3 mLs (2.5 mg total) by nebulization every 6 (six) hours as needed for wheezing or shortness of breath (dx: J43.9). 360 mL 5   albuterol (VENTOLIN HFA) 108 (90  Base) MCG/ACT inhaler USE 2 INHALATIONS EVERY 6 HOURS AS NEEDED FOR WHEEZING OR SHORTNESS OF BREATH (Patient taking differently: Inhale 2 puffs into the lungs every 6 (six) hours as needed for shortness of breath.) 25.5 g 3   ALPRAZolam (XANAX) 0.5 MG tablet Take 0.5 mg by mouth 3 (three) times daily as needed for sleep or anxiety.      Budeson-Glycopyrrol-Formoterol (BREZTRI AEROSPHERE) 160-9-4.8 MCG/ACT AERO Inhale 2 puffs into the lungs in the morning and at bedtime. 3 each 3   carboxymethylcellulose (LUBRICANT EYE DROPS) 0.5 % SOLN Place 1 drop into both eyes 3 (three) times daily.     diclofenac sodium (VOLTAREN) 1 % GEL Apply 2-4 g topically 4 (four) times daily as needed for pain (Hand pain).     empagliflozin (JARDIANCE) 25 MG TABS tablet Take 25 mg by mouth daily.     Ferrous Sulfate (IRON PO) Take by mouth.     fluticasone (FLONASE) 50 MCG/ACT nasal spray Place 1 spray into both nostrils daily. 48 g 3   folic acid (FOLVITE) 1 MG tablet Take 1 mg by mouth daily.     gabapentin (NEURONTIN) 600 MG tablet Take 600 mg by mouth daily.     HUMALOG KWIKPEN 100 UNIT/ML KwikPen Inject 8-14 Units into the skin See admin instructions. Inject 8 units subcutaneously in the morning & inject 14 units subcutaneously at supper     InFLIXimab (REMICADE IV) Inject 1 Dose into the vein every 6 (six) weeks.      LANTUS SOLOSTAR 100 UNIT/ML Solostar Pen Inject 10 Units into the skin daily. Inject 10 units into  the skin daily 15 mL 1   loratadine (CLARITIN) 10 MG tablet Take 10 mg by mouth daily.     meloxicam (MOBIC) 15 MG tablet Take 15 mg by mouth daily.     metFORMIN (GLUCOPHAGE-XR) 500 MG 24 hr tablet Take 2,000 mg by mouth daily.     methotrexate (RHEUMATREX) 2.5 MG tablet Take 2.5 mg by mouth once a week. Caution:Chemotherapy. Protect from light.     montelukast (SINGULAIR) 10 MG tablet Take 1 tablet (10 mg total) by mouth at bedtime. 90 tablet 3   omeprazole (PRILOSEC) 40 MG capsule TAKE 1 CAPSULE EVERY  MORNING 90 capsule 3   sertraline (ZOLOFT) 100 MG tablet Take 100 mg by mouth daily.     simvastatin (ZOCOR) 40 MG tablet Take 40 mg by mouth daily with supper.     tiZANidine (ZANAFLEX) 4 MG tablet Take 4 mg by mouth every 8 (eight) hours as needed.     TRULICITY 0.75 MG/0.5ML SOPN Inject 0.75 mg into the skin once a week.     azithromycin (ZITHROMAX) 250 MG tablet Take 2 on day one then 1 daily x 4 days 6 tablet 0   BENICAR 20 MG tablet Take 0.5 tablets (10 mg total) by mouth daily. 15 tablet 0   No facility-administered medications prior to visit.    Review of Systems  Review of Systems  Constitutional: Negative.   Respiratory:  Positive for cough. Negative for  shortness of breath.    Physical Exam  BP 128/60 (BP Location: Left Arm, Patient Position: Sitting, Cuff Size: Normal)   Pulse 85   Temp 98.6 F (37 C) (Oral)   Ht 5\' 2"  (1.575 m)   Wt 256 lb 12.8 oz (116.5 kg)   SpO2 94%   BMI 46.97 kg/m  Physical Exam Constitutional:      General: She is not in acute distress.    Appearance: Normal appearance. She is obese. She is not ill-appearing.  HENT:     Head: Normocephalic and atraumatic.     Mouth/Throat:     Mouth: Mucous membranes are moist.     Pharynx: Oropharynx is clear.  Cardiovascular:     Rate and Rhythm: Normal rate and regular rhythm.     Heart sounds: Murmur heard.  Pulmonary:     Effort: Pulmonary effort is normal.     Breath sounds: Normal breath sounds.  Skin:    General: Skin is warm and dry.  Neurological:     General: No focal deficit present.     Mental Status: She is alert and oriented to person, place, and time. Mental status is at baseline.  Psychiatric:        Mood and Affect: Mood normal.        Behavior: Behavior normal.        Thought Content: Thought content normal.        Judgment: Judgment normal.      Lab Results:  CBC    Component Value Date/Time   WBC 8.5 09/17/2022 0150   RBC 4.76 09/17/2022 0150   HGB 14.2 09/17/2022  0150   HCT 43.9 09/17/2022 0150   PLT 110 (L) 09/17/2022 0150   MCV 92.2 09/17/2022 0150   MCH 29.8 09/17/2022 0150   MCHC 32.3 09/17/2022 0150   RDW 14.9 09/17/2022 0150   LYMPHSABS 0.5 (L) 09/14/2022 1759   MONOABS 0.6 09/14/2022 1759   EOSABS 0.1 09/14/2022 1759   BASOSABS 0.0 09/14/2022 1759    BMET    Component Value Date/Time   NA 135 09/17/2022 0150   K 4.0 09/17/2022 0150   CL 103 09/17/2022 0150   CO2 21 (L) 09/17/2022 0150   GLUCOSE 163 (H) 09/17/2022 0150   BUN 17 09/17/2022 0150   CREATININE 0.76 09/17/2022 0150   CREATININE 0.84 05/10/2016 1347   CALCIUM 9.0 09/17/2022 0150   GFRNONAA >60 09/17/2022 0150   GFRAA >60 07/30/2017 0346    BNP    Component Value Date/Time   BNP 15.7 07/29/2017 1631    ProBNP    Component Value Date/Time   PROBNP 40.5 02/15/2008 1205    Imaging: MM 3D DIAGNOSTIC MAMMOGRAM UNILATERAL RIGHT BREAST  Result Date: 03/13/2023 CLINICAL DATA:  Screening recall for possible right breast asymmetry EXAM: DIGITAL DIAGNOSTIC UNILATERAL RIGHT MAMMOGRAM WITH TOMOSYNTHESIS AND CAD TECHNIQUE: Right digital diagnostic mammography and breast tomosynthesis was performed. The images were evaluated with computer-aided detection. COMPARISON:  Previous exam(s). ACR Breast Density Category c: The breasts are heterogeneously dense, which may obscure small masses. FINDINGS: The previously described possible asymmetry in the outer right breast does not persist with additional views, consistent with superimposed fibroglandular tissue. No suspicious mass, microcalcification, or other finding is identified. IMPRESSION: The previously described possible asymmetry does not persist with additional views, consistent with superimposed fibroglandular tissue. No mammographic evidence of malignancy in the right breast. RECOMMENDATION: Return to routine screening mammography is recommended. The patient will be due for screening in  November 2025. I have discussed the  findings and recommendations with the patient. If applicable, a reminder letter will be sent to the patient regarding the next appointment. BI-RADS CATEGORY  1: Negative. Electronically Signed   By: Jacob Moores M.D.   On: 03/13/2023 10:49     Assessment & Plan:   1. Obesity with sleep apnea  2. COPD with chronic bronchitis (HCC)     Chronic Obstructive Pulmonary Disease (COPD) -Increased sputum production with color change to dark green. No worsening shortness of breath. Breztri has improved symptoms overall. She is taking Singulair, mucinex and using flutter valve as directed. Advised patient resume chest percussion vest therapy twice daily. If no improvement in 2-3 days, start Augmentin 1 tab twice daily x 7 days. Check in with provider via MyChart regarding progress and whether antibiotic was needed.  Obstructive Sleep Apnea (OSA) - Patient reports compliance with CPAP therapy but issues with mask fit leading to perceived nocturnal hypoxia. Considering switch to previous mask type. Patient to establish with new DME company, [facility], end of the month for adjustment and supply replenishment. Request [facility] to fax compliance report to provider. Follow up in 4-6 weeks to assess CPAP compliance and effectiveness post-mask change.  BMI and potential Inspire candidacy -Current BMI of 46 which is above the FDA approved limit for Inspire therapy (BMI of 40 or less). Consider weight loss to potentially qualify for Inspire therapy in the future. If weight loss achieved, repeat sleep study to assess severity of OSA.  Rheumatoid arthritis - Continue Methotrexate   General Health Maintenance -Next CT scan due June 2025.       Glenford Bayley, NP 03/22/2023

## 2023-03-22 NOTE — Patient Instructions (Signed)
-  CHRONIC OBSTRUCTIVE PULMONARY DISEASE (COPD): COPD is a chronic lung condition that makes it hard to breathe. You reported increased green sputum production but no worsening shortness of breath. Please resume using your chest percussion vest therapy twice daily. If there is no improvement in 2-3 days, start the prescribed antibiotic and check in with [place] via MyChart to update on your progress.  -OBSTRUCTIVE SLEEP APNEA (OSA): OSA is a condition where your breathing stops and starts during sleep. You are compliant with CPAP therapy but experiencing issues with mask fit. Please bring your CPAP machine to [facility] for adjustment and supply replenishment. Request [facility] to fax your compliance report to [place]. We will follow up in 4-6 weeks to assess the effectiveness of the mask change.  -BMI AND POTENTIAL INSPIRE CANDIDACY: Your current BMI is 46, which is above the limit for Inspire therapy for sleep apnea. Consider weight loss to potentially qualify for this therapy in the future. If you achieve weight loss, we will repeat your sleep study to assess the severity of your sleep apnea.  -GENERAL HEALTH MAINTENANCE: Your next CT scan is due in June 2025. Continue with your current health maintenance routine and follow any additional recommendations provided.  INSTRUCTIONS: Please resume chest percussion vest therapy twice daily for your COPD. If there is no improvement in 2-3 days, start the prescribed antibiotic and update [place] via MyChart. Bring your CPAP machine to [facility] for adjustment and supply replenishment, and request them to fax your compliance report to [place]. We will follow up in 4-6 weeks to assess the effectiveness of the mask change. Consider weight loss to potentially qualify for Inspire therapy in the future, and if achieved, we will repeat your sleep study.   Follow-up 6 week virtual visit with Waynetta Sandy NP (bring CPAP by [facility] and have them fax [place] compliance report for follow-up [phone])

## 2023-03-24 DIAGNOSIS — M0589 Other rheumatoid arthritis with rheumatoid factor of multiple sites: Secondary | ICD-10-CM | POA: Diagnosis not present

## 2023-03-24 DIAGNOSIS — Z79899 Other long term (current) drug therapy: Secondary | ICD-10-CM | POA: Diagnosis not present

## 2023-03-24 DIAGNOSIS — R5383 Other fatigue: Secondary | ICD-10-CM | POA: Diagnosis not present

## 2023-03-28 DIAGNOSIS — E1129 Type 2 diabetes mellitus with other diabetic kidney complication: Secondary | ICD-10-CM | POA: Diagnosis not present

## 2023-03-28 DIAGNOSIS — H353130 Nonexudative age-related macular degeneration, bilateral, stage unspecified: Secondary | ICD-10-CM | POA: Diagnosis not present

## 2023-03-29 DIAGNOSIS — I10 Essential (primary) hypertension: Secondary | ICD-10-CM | POA: Diagnosis not present

## 2023-03-29 DIAGNOSIS — E1129 Type 2 diabetes mellitus with other diabetic kidney complication: Secondary | ICD-10-CM | POA: Diagnosis not present

## 2023-03-29 DIAGNOSIS — N3941 Urge incontinence: Secondary | ICD-10-CM | POA: Diagnosis not present

## 2023-03-29 DIAGNOSIS — J441 Chronic obstructive pulmonary disease with (acute) exacerbation: Secondary | ICD-10-CM | POA: Diagnosis not present

## 2023-04-18 DIAGNOSIS — L84 Corns and callosities: Secondary | ICD-10-CM | POA: Diagnosis not present

## 2023-04-18 DIAGNOSIS — E1142 Type 2 diabetes mellitus with diabetic polyneuropathy: Secondary | ICD-10-CM | POA: Diagnosis not present

## 2023-04-18 DIAGNOSIS — B351 Tinea unguium: Secondary | ICD-10-CM | POA: Diagnosis not present

## 2023-04-25 DIAGNOSIS — H353134 Nonexudative age-related macular degeneration, bilateral, advanced atrophic with subfoveal involvement: Secondary | ICD-10-CM | POA: Diagnosis not present

## 2023-05-01 DIAGNOSIS — N3000 Acute cystitis without hematuria: Secondary | ICD-10-CM | POA: Diagnosis not present

## 2023-05-01 DIAGNOSIS — R300 Dysuria: Secondary | ICD-10-CM | POA: Diagnosis not present

## 2023-05-01 DIAGNOSIS — R3915 Urgency of urination: Secondary | ICD-10-CM | POA: Diagnosis not present

## 2023-05-01 DIAGNOSIS — R102 Pelvic and perineal pain: Secondary | ICD-10-CM | POA: Diagnosis not present

## 2023-05-02 DIAGNOSIS — H35363 Drusen (degenerative) of macula, bilateral: Secondary | ICD-10-CM | POA: Diagnosis not present

## 2023-05-04 DIAGNOSIS — M0589 Other rheumatoid arthritis with rheumatoid factor of multiple sites: Secondary | ICD-10-CM | POA: Diagnosis not present

## 2023-05-05 ENCOUNTER — Telehealth: Payer: Self-pay

## 2023-05-05 ENCOUNTER — Telehealth (INDEPENDENT_AMBULATORY_CARE_PROVIDER_SITE_OTHER): Payer: Medicare Other | Admitting: Primary Care

## 2023-05-05 DIAGNOSIS — J0100 Acute maxillary sinusitis, unspecified: Secondary | ICD-10-CM

## 2023-05-05 DIAGNOSIS — J4489 Other specified chronic obstructive pulmonary disease: Secondary | ICD-10-CM | POA: Diagnosis not present

## 2023-05-05 DIAGNOSIS — G4733 Obstructive sleep apnea (adult) (pediatric): Secondary | ICD-10-CM | POA: Diagnosis not present

## 2023-05-05 NOTE — Progress Notes (Signed)
Virtual Visit via Video Note  I connected with [name] on 05/05/23 at  2:30 PM EST by a video enabled telemedicine application and verified that I am speaking with the correct person using two identifiers.  Location: Patient: Home Provider: Office   I discussed the limitations of evaluation and management by telemedicine and the availability of in person appointments. The patient expressed understanding and agreed to proceed.  History of Present Illness: 73 year old female, former smoker. PMH significant for COPD, OSA, diabetes, RA, cardiac murmur, obesity. Former Dr. Craige [name] patient   Previous [facility] pulmonary encounter: 03/22/2023 Discussed the use of AI scribe software for clinical note transcription with the patient, who gave verbal consent to proceed.  History of Present Illness   The patient, primarily followed by Dr. Craige [name] for COPD, reports improved respiratory symptoms since starting Breztri. She notes a significant reduction in cough and sputum production, attributing the current increase in green sputum to a change in weather. Despite this, she has not been utilizing his chest percussion system, the vest, but has been using the flutter valve and Mucinex. The patient reports the onset of increased sputum production to be last week.  In addition to COPD, the patient is also being managed for sleep apnea with a CPAP machine. She reports nightly compliance but is experiencing issues with mask fit. The patient attributes this to recent weight loss and plans to switch back to a previous mask type with a new DME company [employer] at the end of the month. Compliance report in airview is no up to date, she will bring machine by DME for download and we will follow-up in 4-6 weeks virtually. The patient has not had a sleep study since 2009.  The patient also expresses interest in the [facility] device for sleep apnea management but currently does not meet the BMI qualification. She is considering  weight loss to become a candidate for the device.   Chronic Obstructive Pulmonary Disease (COPD) -Increased sputum production with color change to dark green. No worsening shortness of breath. Breztri has improved symptoms overall. She is taking Singulair, mucinex and using flutter valve as directed. Advised patient resume chest percussion vest therapy twice daily. If no improvement in 2-3 days, start Augmentin 1 tab twice daily x 7 days. Check in with provider via MyChart regarding progress and whether antibiotic was needed.  Obstructive Sleep Apnea (OSA) - Patient reports compliance with CPAP therapy but issues with mask fit leading to perceived nocturnal hypoxia. Considering switch to previous mask type. Patient to establish with new DME company, [employer], end of the month for adjustment and supply replenishment. Request [employer] to fax compliance report to provider. Follow up in 4-6 weeks to assess CPAP compliance and effectiveness post-mask change.   05/05/2023- interim hx  Discussed the use of AI scribe software for clinical note transcription with the patient, who gave verbal consent to proceed.  Patient contacted today for FU for COPD and OSA.  In December, she experienced a flare-up of chronic bronchitis with increased sputum production and color change to dark green. However, there was no change in her breathing. She held off on taking antibiotic as her congestion improved over the past weeks with no wheezing reported. He has been using a flutter valve and a percussion valve regularly. She recently received a new vest as the Velcro on the previous one wore out. She reported that [name] has improved his breathing somewhat overall.  She has, however, been experiencing sinus congestion for  a little over a week, with dark, bloody mucus being expelled when blowing her nose. She has not taken any over-the-counter medication for this, except for [name] for headaches. He has been using Flonase every  night and continues to take Singulair. She has a sinus rinse available.  For sleep apnea, she has been using a CPAP machine every night without any issues. She recently change DME companies from [employer] to [facility]. She was able to receive a new mask from [facility], which has improved her experience, however, they were unable to pull compliance report from her machine. She has had the current machine for four years and is due for an upgrade in about a year.      Observations/Objective:  Appear well without overt respiratory symptoms  Assessment and Plan:     Chronic Bronchitis/COPD Stable with no wheezing. Using Breztri twice daily, albuterol inhaler, and albuterol nebulizer solution as needed. Percussion vest and flutter valve use have been beneficial. -Continue current regimen.  Sinusitis New onset of sinus congestion with dark, bloody mucus for over a week. No sinus pain or pressure. Currently using Flonase nightly and Singulair. -Start Augmentin for sinusitis. -Consider sinus rinses.  Rheumatoid Arthritis On Methotrexate and Remicade. - Hold Methotrexate while on Augmentin. - Continue follow with Dr. Dierdre [name]  Sleep Apnea Patient reports using CPAP nightly with new mask from [employer]. No current issues reported. Unable to get download, patient reports she brought machine by [employer] but they were unable to access compliance reports cause [employer] had something locked. We have reached out to [employer] and RT will look into this  -Continue CPAP use nightly. -Plan to order new CPAP machine when eligible in approximately one year.   BMI and potential Inspire candidacy -Current BMI of 46 which is above the FDA approved limit for Inspire therapy (BMI of 40 or less). Consider weight loss to potentially qualify for Inspire therapy in the future. If weight loss achieved, repeat sleep study to assess severity of OSA.  General Health Maintenance -Next CT scan due June 2025.    Follow Up Instructions:  July 2025 to review CT results and fu COPD/OSA on CPAP    I discussed the assessment and treatment plan with the patient. The patient was provided an opportunity to ask questions and all were answered. The patient agreed with the plan and demonstrated an understanding of the instructions.   The patient was advised to call back or seek an in-person evaluation if the symptoms worsen or if the condition fails to improve as anticipated.  I provided 25 minutes of non-face-to-face time during this encounter.   Glenford Bayley, NP

## 2023-05-05 NOTE — Patient Instructions (Signed)
-  CHRONIC BRONCHITIS/COPD: Chronic bronchitis and COPD are long-term lung conditions that cause breathing difficulties. Your condition is stable with no wheezing, and you should continue using Breztri twice daily, along with your albuterol inhaler and nebulizer solution as needed. The percussion vest and flutter valve have been helpful, so please continue using them.  -SINUSITIS: Sinusitis is an inflammation of the sinuses that can cause congestion and mucus. You have been experiencing sinus congestion with dark, bloody mucus for over a week. We are starting you on Augmentin to treat the sinusitis, and you may also consider using sinus rinses to help clear the congestion.  -RHEUMATOID ARTHRITIS: Rheumatoid arthritis is an autoimmune condition that causes joint inflammation. You are currently on Methotrexate and Remicade. While you are taking Augmentin for sinusitis, please hold off on taking Methotrexate.  -SLEEP APNEA: Sleep apnea is a condition where breathing stops and starts during sleep. You are using a CPAP machine nightly with a new mask from [facility] and have no current issues. Continue using the CPAP machine every night, and we will plan to order a new machine when you are eligible in about a year.  INSTRUCTIONS:  Please follow up in 6 months, and make sure to get a CT scan before your visit.

## 2023-05-05 NOTE — Telephone Encounter (Signed)
I called and spoke with the pt in regards to her cpap machine. [name], NP asked if she had ever stopped by our office with her cpap machine and the pt stated no. I then called [employer] (Pts DME company) and I spoke with a man who would send my requests to a RT. I asked the man for a DL from Sep 2021, atleast a 90 day. I also asked for a recent DL also a 90 day, in case [name] needed that as well. I gave the man B pod fax number, and he said he would have these sent to that fax as soon as the RT was informed and handled this.

## 2023-05-08 ENCOUNTER — Telehealth: Payer: Self-pay

## 2023-05-08 NOTE — Telephone Encounter (Signed)
I called [facility] and spoke with Gerlene Burdock, RT. I informed him that our office still has not received a 30 or 90 day DL for [facility], NP. Richard informed me he would send this over and I gave him B pod's fax number. NFN unless this is not sent over.

## 2023-05-09 DIAGNOSIS — H353231 Exudative age-related macular degeneration, bilateral, with active choroidal neovascularization: Secondary | ICD-10-CM | POA: Diagnosis not present

## 2023-05-16 DIAGNOSIS — M6281 Muscle weakness (generalized): Secondary | ICD-10-CM | POA: Diagnosis not present

## 2023-05-16 DIAGNOSIS — R300 Dysuria: Secondary | ICD-10-CM | POA: Diagnosis not present

## 2023-05-16 DIAGNOSIS — K5900 Constipation, unspecified: Secondary | ICD-10-CM | POA: Diagnosis not present

## 2023-05-16 DIAGNOSIS — N3941 Urge incontinence: Secondary | ICD-10-CM | POA: Diagnosis not present

## 2023-05-16 DIAGNOSIS — R350 Frequency of micturition: Secondary | ICD-10-CM | POA: Diagnosis not present

## 2023-05-23 DIAGNOSIS — Z872 Personal history of diseases of the skin and subcutaneous tissue: Secondary | ICD-10-CM | POA: Diagnosis not present

## 2023-05-23 DIAGNOSIS — E1142 Type 2 diabetes mellitus with diabetic polyneuropathy: Secondary | ICD-10-CM | POA: Diagnosis not present

## 2023-05-25 DIAGNOSIS — R350 Frequency of micturition: Secondary | ICD-10-CM | POA: Diagnosis not present

## 2023-05-25 DIAGNOSIS — M6281 Muscle weakness (generalized): Secondary | ICD-10-CM | POA: Diagnosis not present

## 2023-05-25 DIAGNOSIS — N3941 Urge incontinence: Secondary | ICD-10-CM | POA: Diagnosis not present

## 2023-06-06 DIAGNOSIS — H353134 Nonexudative age-related macular degeneration, bilateral, advanced atrophic with subfoveal involvement: Secondary | ICD-10-CM | POA: Diagnosis not present

## 2023-06-15 DIAGNOSIS — M0589 Other rheumatoid arthritis with rheumatoid factor of multiple sites: Secondary | ICD-10-CM | POA: Diagnosis not present

## 2023-06-19 ENCOUNTER — Encounter: Payer: Self-pay | Admitting: Obstetrics & Gynecology

## 2023-06-19 ENCOUNTER — Ambulatory Visit (INDEPENDENT_AMBULATORY_CARE_PROVIDER_SITE_OTHER): Payer: Medicare Other | Admitting: Obstetrics & Gynecology

## 2023-06-19 VITALS — BP 107/67 | HR 87

## 2023-06-19 DIAGNOSIS — R350 Frequency of micturition: Secondary | ICD-10-CM | POA: Diagnosis not present

## 2023-06-19 DIAGNOSIS — R3915 Urgency of urination: Secondary | ICD-10-CM

## 2023-06-19 DIAGNOSIS — N3010 Interstitial cystitis (chronic) without hematuria: Secondary | ICD-10-CM | POA: Diagnosis not present

## 2023-06-19 LAB — POCT URINALYSIS DIPSTICK OB: Nitrite, UA: NEGATIVE

## 2023-06-19 MED ORDER — URELLE 81 MG PO TABS
1.0000 | ORAL_TABLET | Freq: Four times a day (QID) | ORAL | 3 refills | Status: AC
Start: 2023-06-19 — End: ?

## 2023-06-19 MED ORDER — URELLE 81 MG PO TABS
1.0000 | ORAL_TABLET | Freq: Four times a day (QID) | ORAL | 0 refills | Status: DC
Start: 2023-06-19 — End: 2023-08-07

## 2023-06-19 NOTE — Progress Notes (Signed)
 Chief Complaint  Patient presents with   Urinary Frequency      73 y.o. G2P2 No LMP recorded. Patient has had a hysterectomy. The current method of family planning is menopausal.  Outpatient Encounter Medications as of 06/19/2023  Medication Sig Note   albuterol (PROVENTIL) (2.5 MG/3ML) 0.083% nebulizer solution Take 3 mLs (2.5 mg total) by nebulization every 6 (six) hours as needed for wheezing or shortness of breath (dx: J43.9).    albuterol (VENTOLIN HFA) 108 (90 Base) MCG/ACT inhaler USE 2 INHALATIONS EVERY 6 HOURS AS NEEDED FOR WHEEZING OR SHORTNESS OF BREATH (Patient taking differently: Inhale 2 puffs into the lungs every 6 (six) hours as needed for shortness of breath.)    ALPRAZolam (XANAX) 0.5 MG tablet Take 0.5 mg by mouth 3 (three) times daily as needed for sleep or anxiety.     BENICAR 20 MG tablet Take 0.5 tablets (10 mg total) by mouth daily.    Budeson-Glycopyrrol-Formoterol (BREZTRI AEROSPHERE) 160-9-4.8 MCG/ACT AERO Inhale 2 puffs into the lungs in the morning and at bedtime.    carboxymethylcellulose (LUBRICANT EYE DROPS) 0.5 % SOLN Place 1 drop into both eyes 3 (three) times daily.    diclofenac sodium (VOLTAREN) 1 % GEL Apply 2-4 g topically 4 (four) times daily as needed for pain (Hand pain).    empagliflozin (JARDIANCE) 25 MG TABS tablet Take 25 mg by mouth daily.    Ferrous Sulfate (IRON PO) Take by mouth.    fluticasone (FLONASE) 50 MCG/ACT nasal spray Place 1 spray into both nostrils daily.    folic acid (FOLVITE) 1 MG tablet Take 1 mg by mouth daily.    gabapentin (NEURONTIN) 600 MG tablet Take 600 mg by mouth daily.    HUMALOG KWIKPEN 100 UNIT/ML KwikPen Inject 8-14 Units into the skin See admin instructions. Inject 8 units subcutaneously in the morning & inject 14 units subcutaneously at supper    InFLIXimab (REMICADE IV) Inject 1 Dose into the vein every 6 (six) weeks.  09/15/2022: Next dose due June 19th 2024   LANTUS SOLOSTAR 100 UNIT/ML Solostar Pen  Inject 10 Units into the skin daily. Inject 10 units into  the skin daily    loratadine (CLARITIN) 10 MG tablet Take 10 mg by mouth daily.    meloxicam (MOBIC) 15 MG tablet Take 15 mg by mouth daily.    metFORMIN (GLUCOPHAGE-XR) 500 MG 24 hr tablet Take 2,000 mg by mouth daily. 09/15/2022: Patient verified dose is correct    methotrexate (RHEUMATREX) 2.5 MG tablet Take 2.5 mg by mouth once a week. Caution:Chemotherapy. Protect from light. 09/15/2022: Take on Saturdays   montelukast (SINGULAIR) 10 MG tablet Take 1 tablet (10 mg total) by mouth at bedtime.    omeprazole (PRILOSEC) 40 MG capsule TAKE 1 CAPSULE EVERY MORNING    sertraline (ZOLOFT) 100 MG tablet Take 100 mg by mouth daily.    simvastatin (ZOCOR) 40 MG tablet Take 40 mg by mouth daily with supper.    TRULICITY 0.75 MG/0.5ML SOPN Inject 0.75 mg into the skin once a week. 09/15/2022: Take on Saturdays   Urelle (URELLE/URISED) 81 MG TABS tablet Take 1 tablet (81 mg total) by mouth 4 (four) times daily.    Urelle (URELLE/URISED) 81 MG TABS tablet Take 1 tablet (81 mg total) by mouth 4 (four) times daily.    [DISCONTINUED] acetaminophen (TYLENOL) 325 MG tablet Take 650 mg by mouth every 6 (six) hours as needed (for pain.).    [DISCONTINUED] amoxicillin-clavulanate (AUGMENTIN)  875-125 MG tablet Take 1 tablet by mouth 2 (two) times daily. (Patient not taking: Reported on 05/05/2023)    [DISCONTINUED] tiZANidine (ZANAFLEX) 4 MG tablet Take 4 mg by mouth every 8 (eight) hours as needed.    No facility-administered encounter medications on file as of 06/19/2023.    Subjective Pt with pain frequency since 8/24 Treated weveral times for UTI Treated with OAB meds Pain continues  Past Medical History:  Diagnosis Date   Asthma    COPD (chronic obstructive pulmonary disease) (HCC)    Depression    DM type 1 (diabetes mellitus, type 1) (HCC)    GERD (gastroesophageal reflux disease)    Hyperlipidemia    Hypertension    Morbid obesity (HCC)     Obstructive sleep apnea syndrome    Osteoarthritis of right knee 04/2010   end-stage   PONV (postoperative nausea and vomiting)    Rheumatoid arthritis(714.0)    on methotrexate therapy   Septic arthritis of knee, right (HCC) 04/2010    Past Surgical History:  Procedure Laterality Date   ABDOMINAL HYSTERECTOMY  04/11/1990   ABDOMINAL WALL MESH  REMOVAL  08/13/2009   with removal of retained stitches   AORTO-PULMONARY WINDOW REPAIR  04/11/1990   for cardiac tamponade   APPENDECTOMY  04/11/1965   BIOPSY  10/29/2018   Procedure: BIOPSY;  Surgeon: Sherrilyn Rist, MD;  Location: [facility];  Service: Gastroenterology;;   BONE BIOPSY Right 04/19/2016   Procedure: BONE BIOPSY RIGHT GREAT TOE;  Surgeon: Ferman Hamming, DPM;  Location: [facility];  Service: Podiatry;  Laterality: Right;   BREAST BIOPSY Right    multiple Bx - all benign   CHOLECYSTECTOMY  04/11/1990   laparoscopic   COLONOSCOPY  12/08/2011   Procedure: COLONOSCOPY;  Surgeon: Malissa Hippo, MD;  Location: [facility];  Service: Endoscopy;  Laterality: N/A;  830   COLONOSCOPY WITH PROPOFOL N/A 10/16/2017   Procedure: COLONOSCOPY WITH PROPOFOL;  Surgeon: Sherrilyn Rist, MD;  Location: [facility];  Service: Gastroenterology;  Laterality: N/A;   COLONOSCOPY WITH PROPOFOL N/A 10/29/2018   Procedure: COLONOSCOPY WITH PROPOFOL;  Surgeon: Sherrilyn Rist, MD;  Location: [facility];  Service: Gastroenterology;  Laterality: N/A;   ESOPHAGOGASTRODUODENOSCOPY (EGD) WITH PROPOFOL N/A 10/29/2018   Procedure: ESOPHAGOGASTRODUODENOSCOPY (EGD) WITH PROPOFOL;  Surgeon: Sherrilyn Rist, MD;  Location: [facility];  Service: Gastroenterology;  Laterality: N/A;   FOREIGN BODY REMOVAL ABDOMINAL  01/04/2010   stitch abscesses   HERNIA REPAIR  04/12/1999   open LOA / VWH repair w mesh.  Dr. Lovell Sheehan   HERNIA REPAIR  04/11/2005   open redo LOA / VWH repair w mesh.  Dr. Lovell Sheehan   POLYPECTOMY  10/16/2017   Procedure:  POLYPECTOMY;  Surgeon: Sherrilyn Rist, MD;  Location: [facility];  Service: Gastroenterology;;   POLYPECTOMY  10/29/2018   Procedure: POLYPECTOMY;  Surgeon: Sherrilyn Rist, MD;  Location: [facility];  Service: Gastroenterology;;   REVISION TOTAL KNEE ARTHROPLASTY  06/09/2010   I and D    TOTAL KNEE ARTHROPLASTY  05/07/2010   right knee    OB History     Gravida  2   Para  2   Term      Preterm      AB      Living  2      SAB      IAB      Ectopic      Multiple  Live Births  2           Allergies  Allergen Reactions   Demerol Nausea And Vomiting    Severe   Hydromorphone Hcl Itching    All over the body.   Tetracycline Hives    Social History   Socioeconomic History   Marital status: Widowed    Spouse name: Not on file   Number of children: Not on file   Years of education: Not on file   Highest education level: Not on file  Occupational History   Not on file  Tobacco Use   Smoking status: Former    Current packs/day: 0.00    Average packs/day: 2.0 packs/day for 25.0 years (50.0 ttl pk-yrs)    Types: Cigarettes    Start date: 04/12/1967    Quit date: 04/11/1992    Years since quitting: 31.2   Smokeless tobacco: Never  Vaping Use   Vaping status: Never Used  Substance and Sexual Activity   Alcohol use: No   Drug use: No   Sexual activity: Yes    Partners: Male    Birth control/protection: Surgical  Other Topics Concern   Not on file  Social History Narrative   Not on file   Social Drivers of Health   Financial Resource Strain: Low Risk  (09/10/2020)   Overall Financial Resource Strain (CARDIA)    Difficulty of Paying Living Expenses: Not hard at all  Food Insecurity: No Food Insecurity (09/15/2022)   Hunger Vital Sign    Worried About Running Out of Food in the Last Year: Never true    Ran Out of Food in the Last Year: Never true  Transportation Needs: No Transportation Needs (09/15/2022)   PRAPARE - Therapist, art (Medical): No    Lack of Transportation (Non-Medical): No  Physical Activity: Inactive (09/10/2020)   Exercise Vital Sign    Days of Exercise per Week: 0 days    Minutes of Exercise per Session: 0 min  Stress: Stress Concern Present (09/10/2020)   [employer] of Occupational Health - Occupational Stress Questionnaire    Feeling of Stress : To some extent  Social Connections: Unknown (09/10/2020)   Social Connection and Isolation Panel [NHANES]    Frequency of Communication with Friends and Family: More than three times a week    Frequency of Social Gatherings with Friends and Family: Twice a week    Attends Religious Services: Not on Marketing executive or Organizations: No    Attends Banker Meetings: 1 to 4 times per year    Marital Status: Widowed    Family History  Adopted: Yes  Problem Relation Age of Onset   Cancer Mother    Hyperlipidemia Mother    Diabetes Mother    Thyroid disease Mother    Diabetes Father    COPD Father    Arthritis Father        RA   Ovarian cancer Maternal Grandmother 37 - 39   Hyperlipidemia Brother    Hypertension Brother    Breast cancer Neg Hx    BRCA 1/2 Neg Hx     Medications:       Current Outpatient Medications:    albuterol (PROVENTIL) (2.5 MG/3ML) 0.083% nebulizer solution, Take 3 mLs (2.5 mg total) by nebulization every 6 (six) hours as needed for wheezing or shortness of breath (dx: J43.9)., Disp: 360 mL, Rfl: 5   albuterol (VENTOLIN HFA) 108 (  90 Base) MCG/ACT inhaler, USE 2 INHALATIONS EVERY 6 HOURS AS NEEDED FOR WHEEZING OR SHORTNESS OF BREATH (Patient taking differently: Inhale 2 puffs into the lungs every 6 (six) hours as needed for shortness of breath.), Disp: 25.5 g, Rfl: 3   ALPRAZolam (XANAX) 0.5 MG tablet, Take 0.5 mg by mouth 3 (three) times daily as needed for sleep or anxiety. , Disp: , Rfl:    BENICAR 20 MG tablet, Take 0.5 tablets (10 mg total) by mouth daily., Disp: 15  tablet, Rfl: 0   Budeson-Glycopyrrol-Formoterol (BREZTRI AEROSPHERE) 160-9-4.8 MCG/ACT AERO, Inhale 2 puffs into the lungs in the morning and at bedtime., Disp: 3 each, Rfl: 3   carboxymethylcellulose (LUBRICANT EYE DROPS) 0.5 % SOLN, Place 1 drop into both eyes 3 (three) times daily., Disp: , Rfl:    diclofenac sodium (VOLTAREN) 1 % GEL, Apply 2-4 g topically 4 (four) times daily as needed for pain (Hand pain)., Disp: , Rfl:    empagliflozin (JARDIANCE) 25 MG TABS tablet, Take 25 mg by mouth daily., Disp: , Rfl:    Ferrous Sulfate (IRON PO), Take by mouth., Disp: , Rfl:    fluticasone (FLONASE) 50 MCG/ACT nasal spray, Place 1 spray into both nostrils daily., Disp: 48 g, Rfl: 3   folic acid (FOLVITE) 1 MG tablet, Take 1 mg by mouth daily., Disp: , Rfl:    gabapentin (NEURONTIN) 600 MG tablet, Take 600 mg by mouth daily., Disp: , Rfl:    HUMALOG KWIKPEN 100 UNIT/ML KwikPen, Inject 8-14 Units into the skin See admin instructions. Inject 8 units subcutaneously in the morning & inject 14 units subcutaneously at supper, Disp: , Rfl:    InFLIXimab (REMICADE IV), Inject 1 Dose into the vein every 6 (six) weeks. , Disp: , Rfl:    LANTUS SOLOSTAR 100 UNIT/ML Solostar Pen, Inject 10 Units into the skin daily. Inject 10 units into  the skin daily, Disp: 15 mL, Rfl: 1   loratadine (CLARITIN) 10 MG tablet, Take 10 mg by mouth daily., Disp: , Rfl:    meloxicam (MOBIC) 15 MG tablet, Take 15 mg by mouth daily., Disp: , Rfl:    metFORMIN (GLUCOPHAGE-XR) 500 MG 24 hr tablet, Take 2,000 mg by mouth daily., Disp: , Rfl:    methotrexate (RHEUMATREX) 2.5 MG tablet, Take 2.5 mg by mouth once a week. Caution:Chemotherapy. Protect from light., Disp: , Rfl:    montelukast (SINGULAIR) 10 MG tablet, Take 1 tablet (10 mg total) by mouth at bedtime., Disp: 90 tablet, Rfl: 3   omeprazole (PRILOSEC) 40 MG capsule, TAKE 1 CAPSULE EVERY MORNING, Disp: 90 capsule, Rfl: 3   sertraline (ZOLOFT) 100 MG tablet, Take 100 mg by mouth  daily., Disp: , Rfl:    simvastatin (ZOCOR) 40 MG tablet, Take 40 mg by mouth daily with supper., Disp: , Rfl:    TRULICITY 0.75 MG/0.5ML SOPN, Inject 0.75 mg into the skin once a week., Disp: , Rfl:    Urelle (URELLE/URISED) 81 MG TABS tablet, Take 1 tablet (81 mg total) by mouth 4 (four) times daily., Disp: 120 tablet, Rfl: 0   Urelle (URELLE/URISED) 81 MG TABS tablet, Take 1 tablet (81 mg total) by mouth 4 (four) times daily., Disp: 360 tablet, Rfl: 3  Objective Blood pressure 107/67, pulse 87.  General WDWN female NAD Vulva:  normal appearing vulva with no masses, tenderness or lesions Vagina:  normal mucosa, no discharge Cervix:  Normal no lesions Uterus:  normal size, contour, position, consistency, mobility, non-tender Adnexa: ovaries:present,  normal  adnexa in size, nontender and no masses  DMSO irrigation is performed  Pertinent ROS No burning with urination, frequency or urgency No nausea, vomiting or diarrhea Nor fever chills or other constitutional symptoms   Labs or studies reviewed    Impression + Management Plan: Diagnoses this Encounter::   ICD-10-CM   1. Interstitial cystitis: DMSO irrigation + urelle  N30.10     2. Urinary frequency  R35.0 POC Urinalysis Dipstick OB    Urine Culture    3. Urinary urgency  R39.15 POC Urinalysis Dipstick OB    Urine Culture        Medications prescribed during  this encounter: Meds ordered this encounter  Medications   Urelle (URELLE/URISED) 81 MG TABS tablet    Sig: Take 1 tablet (81 mg total) by mouth 4 (four) times daily.    Dispense:  120 tablet    Refill:  0   Urelle (URELLE/URISED) 81 MG TABS tablet    Sig: Take 1 tablet (81 mg total) by mouth 4 (four) times daily.    Dispense:  360 tablet    Refill:  3    Labs or Scans Ordered during this encounter: Orders Placed This Encounter  Procedures   Urine Culture   Specimen status report   POC Urinalysis Dipstick OB      Follow up Return in about 2  weeks (around 07/03/2023) for Follow up, with Dr Despina Hidden: DMSO.

## 2023-06-21 LAB — URINE CULTURE

## 2023-06-21 LAB — SPECIMEN STATUS REPORT

## 2023-06-23 DIAGNOSIS — E1129 Type 2 diabetes mellitus with other diabetic kidney complication: Secondary | ICD-10-CM | POA: Diagnosis not present

## 2023-06-28 DIAGNOSIS — B351 Tinea unguium: Secondary | ICD-10-CM | POA: Diagnosis not present

## 2023-06-28 DIAGNOSIS — L84 Corns and callosities: Secondary | ICD-10-CM | POA: Diagnosis not present

## 2023-06-28 DIAGNOSIS — E1142 Type 2 diabetes mellitus with diabetic polyneuropathy: Secondary | ICD-10-CM | POA: Diagnosis not present

## 2023-06-30 DIAGNOSIS — E1122 Type 2 diabetes mellitus with diabetic chronic kidney disease: Secondary | ICD-10-CM | POA: Diagnosis not present

## 2023-06-30 DIAGNOSIS — J440 Chronic obstructive pulmonary disease with acute lower respiratory infection: Secondary | ICD-10-CM | POA: Diagnosis not present

## 2023-06-30 DIAGNOSIS — J309 Allergic rhinitis, unspecified: Secondary | ICD-10-CM | POA: Diagnosis not present

## 2023-07-06 ENCOUNTER — Encounter: Payer: Self-pay | Admitting: Obstetrics & Gynecology

## 2023-07-06 ENCOUNTER — Ambulatory Visit: Admitting: Obstetrics & Gynecology

## 2023-07-06 ENCOUNTER — Telehealth: Payer: Self-pay

## 2023-07-06 VITALS — BP 119/57 | HR 83

## 2023-07-06 DIAGNOSIS — N3010 Interstitial cystitis (chronic) without hematuria: Secondary | ICD-10-CM | POA: Diagnosis not present

## 2023-07-06 MED ORDER — DOXYCYCLINE HYCLATE 100 MG PO TABS: 100.0000 mg | ORAL_TABLET | Freq: Two times a day (BID) | ORAL | 0 refills | Status: DC

## 2023-07-06 MED ORDER — ELMIRON 100 MG PO CAPS: ORAL_CAPSULE | ORAL | 3 refills | Status: DC

## 2023-07-06 MED ORDER — SILVER SULFADIAZINE 1 % EX CREA: TOPICAL_CREAM | CUTANEOUS | 11 refills | Status: DC

## 2023-07-06 NOTE — Progress Notes (Signed)
 Diagnosed with IC: 2 weeks ago  Her pain since August has completely resolved  Current Meds:  Urelle Dietary restrictions  Will add elmiron and continue urelle + DMSO irrigations    Pt states her symptoms have been stable, no exacerbations, although not perfect Wants to continue on this cycle  Blood pressure (!) 119/57, pulse 83.    The external urethra meatus was prepped with betadine DMSO 50 cc was instilled in the usual fashion after the bladder was catheterized and emptied completely 50cc was instilled into the bladder without difficulty and the patient tolerated well She will refrain from voiding as long as possible  Follow up in 4 weeks, or as patient requests based on her symptom complex  Meds ordered this encounter  Medications   pentosan polysulfate (ELMIRON) 100 MG capsule    Sig: Take 2 tablets twice daily    Dispense:  360 capsule    Refill:  3

## 2023-07-06 NOTE — Telephone Encounter (Signed)
 Patient called and stated that when she was in on yesterday; Dr. Despina Hidden was supposed to call her in some Doxycycline but, it is not at the pharmacy.

## 2023-07-06 NOTE — Addendum Note (Signed)
 Addended by: Lazaro Arms on: 07/06/2023 04:32 PM   Modules accepted: Orders

## 2023-07-18 DIAGNOSIS — H353134 Nonexudative age-related macular degeneration, bilateral, advanced atrophic with subfoveal involvement: Secondary | ICD-10-CM | POA: Diagnosis not present

## 2023-07-24 ENCOUNTER — Other Ambulatory Visit: Payer: Self-pay

## 2023-07-24 ENCOUNTER — Emergency Department (HOSPITAL_BASED_OUTPATIENT_CLINIC_OR_DEPARTMENT_OTHER)

## 2023-07-24 ENCOUNTER — Emergency Department (HOSPITAL_BASED_OUTPATIENT_CLINIC_OR_DEPARTMENT_OTHER)
Admission: EM | Admit: 2023-07-24 | Discharge: 2023-07-24 | Disposition: A | Discharge status: Home / Self Care | Attending: Emergency Medicine | Admitting: Emergency Medicine

## 2023-07-24 ENCOUNTER — Encounter (HOSPITAL_BASED_OUTPATIENT_CLINIC_OR_DEPARTMENT_OTHER): Payer: Self-pay

## 2023-07-24 DIAGNOSIS — E119 Type 2 diabetes mellitus without complications: Secondary | ICD-10-CM | POA: Diagnosis not present

## 2023-07-24 DIAGNOSIS — J189 Pneumonia, unspecified organism: Secondary | ICD-10-CM | POA: Insufficient documentation

## 2023-07-24 DIAGNOSIS — R0602 Shortness of breath: Secondary | ICD-10-CM | POA: Diagnosis not present

## 2023-07-24 DIAGNOSIS — J449 Chronic obstructive pulmonary disease, unspecified: Secondary | ICD-10-CM | POA: Insufficient documentation

## 2023-07-24 DIAGNOSIS — Z7984 Long term (current) use of oral hypoglycemic drugs: Secondary | ICD-10-CM | POA: Insufficient documentation

## 2023-07-24 DIAGNOSIS — R918 Other nonspecific abnormal finding of lung field: Secondary | ICD-10-CM | POA: Diagnosis not present

## 2023-07-24 DIAGNOSIS — Z794 Long term (current) use of insulin: Secondary | ICD-10-CM | POA: Insufficient documentation

## 2023-07-24 LAB — COMPREHENSIVE METABOLIC PANEL WITH GFR
ALT: 23 U/L (ref 0–44)
AST: 19 U/L (ref 15–41)
Albumin: 4 g/dL (ref 3.5–5.0)
Alkaline Phosphatase: 58 U/L (ref 38–126)
Anion gap: 9 (ref 5–15)
BUN: 17 mg/dL (ref 8–23)
CO2: 26 mmol/L (ref 22–32)
Calcium: 9.7 mg/dL (ref 8.9–10.3)
Chloride: 100 mmol/L (ref 98–111)
Creatinine, Ser: 0.68 mg/dL (ref 0.44–1.00)
GFR, Estimated: >60 mL/min (ref >60)
Glucose, Bld: 84 mg/dL (ref 70–99)
Potassium: 4.2 mmol/L (ref 3.5–5.1)
Sodium: 135 mmol/L (ref 135–145)
Total Bilirubin: 0.9 mg/dL (ref 0.0–1.2)
Total Protein: 7.4 g/dL (ref 6.5–8.1)

## 2023-07-24 LAB — CBG MONITORING, ED: Glucose-Capillary: 95 mg/dL (ref 70–99)

## 2023-07-24 LAB — CBC WITH DIFFERENTIAL/PLATELET
Abs Immature Granulocytes: 0.02 10*3/uL (ref 0.00–0.07)
Basophils Absolute: 0 10*3/uL (ref 0.0–0.1)
Basophils Relative: 1 %
Eosinophils Absolute: 0.3 10*3/uL (ref 0.0–0.5)
Eosinophils Relative: 5 %
HCT: 48.3 % — ABNORMAL HIGH (ref 36.0–46.0)
Hemoglobin: 16.2 g/dL — ABNORMAL HIGH (ref 12.0–15.0)
Immature Granulocytes: 0 %
Lymphocytes Relative: 21 %
Lymphs Abs: 1.2 10*3/uL (ref 0.7–4.0)
MCH: 29.4 pg (ref 26.0–34.0)
MCHC: 33.5 g/dL (ref 30.0–36.0)
MCV: 87.7 fL (ref 80.0–100.0)
Monocytes Absolute: 0.9 10*3/uL (ref 0.1–1.0)
Monocytes Relative: 15 %
Neutro Abs: 3.5 10*3/uL (ref 1.7–7.7)
Neutrophils Relative %: 58 %
Platelets: 180 10*3/uL (ref 150–400)
RBC: 5.51 MIL/uL — ABNORMAL HIGH (ref 3.87–5.11)
RDW: 13.9 % (ref 11.5–15.5)
WBC: 5.9 10*3/uL (ref 4.0–10.5)
nRBC: 0 % (ref 0.0–0.2)

## 2023-07-24 LAB — RESP PANEL BY RT-PCR (RSV, FLU A&B, COVID)  RVPGX2
Influenza A by PCR: NEGATIVE
Influenza B by PCR: NEGATIVE
Resp Syncytial Virus by PCR: NEGATIVE
SARS Coronavirus 2 by RT PCR: NEGATIVE

## 2023-07-24 LAB — BRAIN NATRIURETIC PEPTIDE: B Natriuretic Peptide: 24.7 pg/mL (ref 0.0–100.0)

## 2023-07-24 MED ORDER — MAGNESIUM SULFATE 2 GM/50ML IV SOLN
2.0000 g | Freq: Once | INTRAVENOUS | Status: AC
  Administered 2023-07-24: 2 g via INTRAVENOUS
  Filled 2023-07-24: qty 50

## 2023-07-24 MED ORDER — AZITHROMYCIN 250 MG PO TABS
500.0000 mg | ORAL_TABLET | Freq: Once | ORAL | Status: AC
  Administered 2023-07-24: 500 mg via ORAL
  Filled 2023-07-24: qty 2

## 2023-07-24 MED ORDER — IPRATROPIUM-ALBUTEROL 0.5-2.5 (3) MG/3ML IN SOLN: 3.0000 mL | Freq: Once | RESPIRATORY_TRACT | Status: DC

## 2023-07-24 MED ORDER — AZITHROMYCIN 250 MG PO TABS: 250.0000 mg | ORAL_TABLET | Freq: Every day | ORAL | 0 refills | Status: DC

## 2023-07-24 MED ORDER — IPRATROPIUM-ALBUTEROL 0.5-2.5 (3) MG/3ML IN SOLN
RESPIRATORY_TRACT | Status: AC
Start: 2023-07-24 — End: 2023-07-24
  Administered 2023-07-24: 3 mL via RESPIRATORY_TRACT
  Filled 2023-07-24: qty 3

## 2023-07-24 MED ORDER — IPRATROPIUM-ALBUTEROL 0.5-2.5 (3) MG/3ML IN SOLN
3.0000 mL | Freq: Once | RESPIRATORY_TRACT | Status: AC
  Administered 2023-07-24: 3 mL via RESPIRATORY_TRACT
  Filled 2023-07-24: qty 3

## 2023-07-24 MED ORDER — ALBUTEROL SULFATE (2.5 MG/3ML) 0.083% IN NEBU: 2.5000 mg | INHALATION_SOLUTION | Freq: Once | RESPIRATORY_TRACT | Status: AC

## 2023-07-24 MED ORDER — METHYLPREDNISOLONE SODIUM SUCC 125 MG IJ SOLR
125.0000 mg | Freq: Once | INTRAMUSCULAR | Status: AC
  Administered 2023-07-24: 125 mg via INTRAVENOUS
  Filled 2023-07-24: qty 2

## 2023-07-24 MED ORDER — IPRATROPIUM-ALBUTEROL 0.5-2.5 (3) MG/3ML IN SOLN: 3.0000 mL | RESPIRATORY_TRACT | Status: DC

## 2023-07-24 MED ORDER — IPRATROPIUM-ALBUTEROL 0.5-2.5 (3) MG/3ML IN SOLN: 3.0000 mL | Freq: Once | RESPIRATORY_TRACT | Status: AC

## 2023-07-24 MED ORDER — METHYLPREDNISOLONE 4 MG PO TBPK: ORAL_TABLET | ORAL | 0 refills | Status: DC

## 2023-07-24 MED ORDER — ALBUTEROL SULFATE (2.5 MG/3ML) 0.083% IN NEBU
INHALATION_SOLUTION | RESPIRATORY_TRACT | Status: AC
  Administered 2023-07-24: 2.5 mg via RESPIRATORY_TRACT
  Filled 2023-07-24: qty 3

## 2023-07-24 MED ORDER — SODIUM CHLORIDE 0.9 % IV SOLN
1.0000 g | Freq: Once | INTRAVENOUS | Status: AC
  Administered 2023-07-24: 1 g via INTRAVENOUS
  Filled 2023-07-24: qty 10

## 2023-07-24 NOTE — ED Notes (Signed)
Reviewed discharge instructions, medications, and home care with pt. Pt verbalized understanding and had no further questions. Pt exited ED without complications.

## 2023-07-24 NOTE — Discharge Instructions (Signed)
Contact a health care provider if: You have a fever. You have trouble sleeping because you cannot control your cough with cough medicine. Get help right away if: Your shortness of breath becomes worse. Your chest pain increases. Your sickness becomes worse, especially if you are an older adult or have a weak immune system. You cough up blood. These symptoms may be an emergency. Get help right away. Call 911. Do not wait to see if the symptoms will go away. Do not drive yourself to the hospital. 

## 2023-07-24 NOTE — ED Notes (Signed)
 RT ambulated the Pt on RA. Her SATS 90% and her HR 100

## 2023-07-24 NOTE — ED Provider Notes (Signed)
 [place] [facility] DEPARTMENT AT [facility] Provider Note   CSN: [id number] Arrival date & time: 07/24/23  1408     History  Chief Complaint  Patient presents with   Shortness of Breath    [name] is a 73 y.o. female who presents with chief complaint of shortness of breath.  Patient has had waxing and wakening symptoms for the past 2 weeks progressively worsening over the past few days of cough, wheezing, shortness of breath.  She did not use her nebulizer treatment today.  No history of heart failure, no swelling or peripheral edema.  She denies chest pain.  She did have chills this morning was concerned she could have a community-acquired pneumonia.  She has a past medical history of diabetes, rheumatoid arthritis on immunosuppression.  She has a history of recurrent episodes of community-acquired pneumonia.  She denies fevers.   Shortness of Breath      Home Medications Prior to Admission medications   Medication Sig Start Date End Date Taking? Authorizing Provider  albuterol (PROVENTIL) (2.5 MG/3ML) 0.083% nebulizer solution Take 3 mLs (2.5 mg total) by nebulization every 6 (six) hours as needed for wheezing or shortness of breath (dx: J43.9). 02/25/22   Sood, Vineet, MD  albuterol (VENTOLIN HFA) 108 (90 Base) MCG/ACT inhaler USE 2 INHALATIONS EVERY 6 HOURS AS NEEDED FOR WHEEZING OR SHORTNESS OF BREATH Patient taking differently: Inhale 2 puffs into the lungs every 6 (six) hours as needed for shortness of breath. 09/06/22   Sood, Vineet, MD  ALPRAZolam (XANAX) 0.5 MG tablet Take 0.5 mg by mouth 3 (three) times daily as needed for sleep or anxiety.     [provider]  BENICAR 20 MG tablet Take 0.5 tablets (10 mg total) by mouth daily. 09/19/22 06/19/23  Fonnie Iba I, MD  Budeson-Glycopyrrol-Formoterol (BREZTRI AEROSPHERE) 160-9-4.8 MCG/ACT AERO Inhale 2 puffs into the lungs in the morning and at bedtime. 01/10/23   Diamond Formica, MD   carboxymethylcellulose (LUBRICANT EYE DROPS) 0.5 % SOLN Place 1 drop into both eyes 3 (three) times daily.    [provider]  diclofenac sodium (VOLTAREN) 1 % GEL Apply 2-4 g topically 4 (four) times daily as needed for pain (Hand pain). 08/13/18   [provider]  doxycycline (VIBRA-TABS) 100 MG tablet Take 1 tablet (100 mg total) by mouth 2 (two) times daily. 07/06/23   Wendelyn Halter, MD  empagliflozin (JARDIANCE) 25 MG TABS tablet Take 25 mg by mouth daily.    [provider]  Ferrous Sulfate (IRON PO) Take by mouth.    [provider]  fluticasone (FLONASE) 50 MCG/ACT nasal spray Place 1 spray into both nostrils daily. 02/27/23   Diamond Formica, MD  folic acid (FOLVITE) 1 MG tablet Take 1 mg by mouth daily. 09/01/21   [provider]  gabapentin (NEURONTIN) 600 MG tablet Take 600 mg by mouth daily.    [provider]  HUMALOG KWIKPEN 100 UNIT/ML KwikPen Inject 8-14 Units into the skin See admin instructions. Inject 8 units subcutaneously in the morning & inject 14 units subcutaneously at supper 08/13/18   [provider]  InFLIXimab (REMICADE IV) Inject 1 Dose into the vein every 6 (six) weeks.     [provider]  LANTUS SOLOSTAR 100 UNIT/ML Solostar Pen Inject 10 Units into the skin daily. Inject 10 units into  the skin daily 09/19/22   Fonnie Iba I, MD  loratadine (CLARITIN) 10 MG tablet Take 10 mg by  mouth daily.    [provider]  meloxicam (MOBIC) 15 MG tablet Take 15 mg by mouth daily. 11/24/22   [provider]  metFORMIN (GLUCOPHAGE-XR) 500 MG 24 hr tablet Take 2,000 mg by mouth daily.    [provider]  methotrexate (RHEUMATREX) 2.5 MG tablet Take 2.5 mg by mouth once a week. Caution:Chemotherapy. Protect from light.    [provider]  montelukast (SINGULAIR) 10 MG tablet Take 1 tablet (10 mg total) by mouth at bedtime. 02/27/23   Nyoka Cowden, MD  omeprazole (PRILOSEC)  40 MG capsule TAKE 1 CAPSULE EVERY MORNING 01/10/21   Coralyn Helling, MD  pentosan polysulfate (ELMIRON) 100 MG capsule Take 2 tablets twice daily 07/06/23   Lazaro Arms, MD  sertraline (ZOLOFT) 100 MG tablet Take 100 mg by mouth daily.    [provider]  silver sulfADIAZINE (SILVADENE) 1 % cream Apply 3 times daily to boils 07/06/23   Lazaro Arms, MD  simvastatin (ZOCOR) 40 MG tablet Take 40 mg by mouth daily with supper.    [provider]  TRULICITY 0.75 MG/0.5ML SOPN Inject 0.75 mg into the skin once a week. 01/22/20   [provider]  Urelle (URELLE/URISED) 81 MG TABS tablet Take 1 tablet (81 mg total) by mouth 4 (four) times daily. 06/19/23   Lazaro Arms, MD  Urelle (URELLE/URISED) 81 MG TABS tablet Take 1 tablet (81 mg total) by mouth 4 (four) times daily. 06/19/23   Lazaro Arms, MD      Allergies    Demerol, Hydromorphone hcl, and Tetracycline    Review of Systems   Review of Systems  Respiratory:  Positive for shortness of breath.     Physical Exam Updated Vital Signs BP (!) 109/50 (BP Location: Right Arm)   Pulse 92   Temp 98.3 F (36.8 C) (Oral)   Resp 20   SpO2 93%  Physical Exam Vitals and nursing note reviewed.  Constitutional:      General: She is not in acute distress.    Appearance: She is well-developed. She is not diaphoretic.  HENT:     Head: Normocephalic and atraumatic.     Right Ear: External ear normal.     Left Ear: External ear normal.     Nose: Nose normal.     Mouth/Throat:     Mouth: Mucous membranes are moist.  Eyes:     General: No scleral icterus.    Conjunctiva/sclera: Conjunctivae normal.  Cardiovascular:     Rate and Rhythm: Normal rate and regular rhythm.     Heart sounds: Normal heart sounds. No murmur heard.    No friction rub. No gallop.  Pulmonary:     Effort: Pulmonary effort is normal. Tachypnea and prolonged expiration present. No respiratory distress.     Breath sounds: Decreased air movement  present. Wheezing present.  Abdominal:     General: Bowel sounds are normal. There is no distension.     Palpations: Abdomen is soft. There is no mass.     Tenderness: There is no abdominal tenderness. There is no guarding.  Musculoskeletal:     Cervical back: Normal range of motion.     Right lower leg: No edema.     Left lower leg: No edema.  Skin:    General: Skin is warm and dry.  Neurological:     Mental Status: She is alert and oriented to person, place, and time.  Psychiatric:  Behavior: Behavior normal.     ED Results / Procedures / Treatments   Labs (all labs ordered are listed, but only abnormal results are displayed) Labs Reviewed  RESP PANEL BY RT-PCR (RSV, FLU A&B, COVID)  [id number]  CBC WITH DIFFERENTIAL/PLATELET  COMPREHENSIVE METABOLIC PANEL WITH GFR  BRAIN NATRIURETIC PEPTIDE  CBG MONITORING, ED    EKG None  Radiology No results found.  Procedures Procedures    Medications Ordered in ED Medications  albuterol (PROVENTIL) (2.5 MG/3ML) 0.083% nebulizer solution 2.5 mg (has no administration in time range)  ipratropium-albuterol (DUONEB) 0.5-2.5 (3) MG/3ML nebulizer solution 3 mL (has no administration in time range)  magnesium sulfate IVPB 2 g 50 mL (has no administration in time range)    ED Course/ Medical Decision Making/ A&P Clinical Course as of 07/24/23 2336  Mon Jul 24, 2023  1525 Hemoglobin(!): 16.2 [AH]  1556 Patient reassessed.  Only minimal improvement with initial DuoNeb.  Will give her another treatment.  Have added Solu-Medrol. [AH]  1808 DG Chest Port 1 View [AH]    Clinical Course User Index [AH] Arthor Captain, PA-C                                 Medical Decision Making Amount and/or Complexity of Data Reviewed Labs: ordered. Decision-making details documented in ED Course. Radiology: ordered. Decision-making details documented in ED Course.  Risk Prescription drug management.   This patient presents to the ED  with chief complaint(s) of shortness of breath with pertinent past medical history of COPD, obesity, diabetes, RA on immunosuppressive therapy which further complicates the presenting complaint. The complaint involves an extensive differential diagnosis and treatment options and also carries with it a high risk of complications and morbidity.    The emergent differential diagnosis for shortness of breath includes, but is not limited to, Pulmonary edema, bronchoconstriction, Pneumonia, Pulmonary embolism, Pneumotherax/ Hemothorax, Dysrythmia, ACS.     The initial plan is to order CBC, CMP, BNP, cardiac monitoring chest x-ray and to treat patient with DuoNeb, magnesium plus minus steroids for COPD exacerbation versus pneumonia  Additional Tests and treatment considered:     Reassessment and review (also see workup area): Lab Tests: I Ordered, and personally interpreted labs.  The pertinent results include:    Mildly elevated hemoglobin appears to be chronic for the patient likely due to history of COPD.  Respiratory panel negative.  Remainder of labs are insignificant.  Imaging Studies: I ordered and independently visualized and interpreted the following imaging X-ray of the chest   which showed bilateral patchy infiltrates likely developing community-acquired pneumonia The interpretation of the imaging was limited to assessing for emergent pathology, for which purpose it was ordered.    Medicines ordered and prescription drug management: I ordered the following medications multiple DuoNebs, magnesium Rocephin, doxycycline and COPD/pneumonia    Reevaluation of the patient after these medicines showed that the patient    improved  Patient was able to ambulate in the emergency department and oxygen saturations stayed above 90%  Cardiac Monitoring: The patient was maintained on a cardiac monitor.  I personally viewed and interpreted the cardiac monitor which showed an underlying rhythm of:   sinus tachycardia  Complexity of problems addressed: Patient's presentation is most consistent with  severe exacerbation of chronic illness During patient's assessment  Disposition: After consideration of the diagnostic results and the patient's response to treatment,  I feel that the patent would  benefit from discharge with treatment for continued COPD exacerbation/community-acquired pneumonia and strict return precautions. .          Final Clinical Impression(s) / ED Diagnoses Final diagnoses:  None    Rx / DC Orders ED Discharge Orders     None         Tama Fails, PA-C 07/24/23 2338    Hershel Los, MD 07/24/23 [phone]

## 2023-07-24 NOTE — ED Triage Notes (Signed)
 Pt c/o SHOB "off & on for 2wks, today worse,  I started running low-grade fever (99.2-99.9)." tylenol PTA.  Hx COPD, asthma, last breathing tx yesterday.

## 2023-08-01 DIAGNOSIS — R234 Changes in skin texture: Secondary | ICD-10-CM | POA: Diagnosis not present

## 2023-08-01 DIAGNOSIS — E1142 Type 2 diabetes mellitus with diabetic polyneuropathy: Secondary | ICD-10-CM | POA: Diagnosis not present

## 2023-08-02 DIAGNOSIS — M0589 Other rheumatoid arthritis with rheumatoid factor of multiple sites: Secondary | ICD-10-CM | POA: Diagnosis not present

## 2023-08-07 ENCOUNTER — Ambulatory Visit: Admitting: Obstetrics & Gynecology

## 2023-08-07 ENCOUNTER — Encounter: Payer: Self-pay | Admitting: Internal Medicine

## 2023-08-07 ENCOUNTER — Encounter: Payer: Self-pay | Admitting: Obstetrics & Gynecology

## 2023-08-07 VITALS — BP 118/74 | HR 76 | Ht 62.0 in | Wt 246.5 lb

## 2023-08-07 DIAGNOSIS — N3010 Interstitial cystitis (chronic) without hematuria: Secondary | ICD-10-CM | POA: Diagnosis not present

## 2023-08-07 NOTE — Progress Notes (Signed)
 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    The external urethra meatus was prepped with betadine DMSO 50 cc was instilled in the usual fashion after the bladder was catheterized and emptied completely 50cc was instilled into the bladder without difficulty and the patient tolerated well She will refrain from voiding as long as possible  Follow up in 6 weeks, or as patient requests based on her symptom complex

## 2023-08-10 ENCOUNTER — Other Ambulatory Visit: Payer: Self-pay

## 2023-08-10 MED ORDER — ALBUTEROL SULFATE HFA 108 (90 BASE) MCG/ACT IN AERS: INHALATION_SPRAY | RESPIRATORY_TRACT | 3 refills | Status: DC

## 2023-08-15 DIAGNOSIS — E119 Type 2 diabetes mellitus without complications: Secondary | ICD-10-CM | POA: Diagnosis not present

## 2023-08-15 DIAGNOSIS — H353133 Nonexudative age-related macular degeneration, bilateral, advanced atrophic without subfoveal involvement: Secondary | ICD-10-CM | POA: Diagnosis not present

## 2023-08-15 DIAGNOSIS — H353231 Exudative age-related macular degeneration, bilateral, with active choroidal neovascularization: Secondary | ICD-10-CM | POA: Diagnosis not present

## 2023-08-15 DIAGNOSIS — Z961 Presence of intraocular lens: Secondary | ICD-10-CM | POA: Diagnosis not present

## 2023-08-15 DIAGNOSIS — H35033 Hypertensive retinopathy, bilateral: Secondary | ICD-10-CM | POA: Diagnosis not present

## 2023-08-15 DIAGNOSIS — H43813 Vitreous degeneration, bilateral: Secondary | ICD-10-CM | POA: Diagnosis not present

## 2023-08-22 DIAGNOSIS — L299 Pruritus, unspecified: Secondary | ICD-10-CM | POA: Diagnosis not present

## 2023-08-22 DIAGNOSIS — M1991 Primary osteoarthritis, unspecified site: Secondary | ICD-10-CM | POA: Diagnosis not present

## 2023-08-22 DIAGNOSIS — Z6841 Body Mass Index (BMI) 40.0 and over, adult: Secondary | ICD-10-CM | POA: Diagnosis not present

## 2023-08-22 DIAGNOSIS — M545 Low back pain, unspecified: Secondary | ICD-10-CM | POA: Diagnosis not present

## 2023-08-22 DIAGNOSIS — B372 Candidiasis of skin and nail: Secondary | ICD-10-CM | POA: Diagnosis not present

## 2023-08-22 DIAGNOSIS — M0589 Other rheumatoid arthritis with rheumatoid factor of multiple sites: Secondary | ICD-10-CM | POA: Diagnosis not present

## 2023-08-22 DIAGNOSIS — Z79899 Other long term (current) drug therapy: Secondary | ICD-10-CM | POA: Diagnosis not present

## 2023-08-29 DIAGNOSIS — H353134 Nonexudative age-related macular degeneration, bilateral, advanced atrophic with subfoveal involvement: Secondary | ICD-10-CM | POA: Diagnosis not present

## 2023-09-09 DIAGNOSIS — R911 Solitary pulmonary nodule: Secondary | ICD-10-CM

## 2023-09-11 NOTE — Telephone Encounter (Signed)
 It was ordered by Dr. Matilde Son last year, can we schedule this or do we need to re-order? It looks active

## 2023-09-11 NOTE — Telephone Encounter (Addendum)
Discussed with [name].

## 2023-09-11 NOTE — Telephone Encounter (Signed)
 Thanks Margie, that I was I was more concerned with.

## 2023-09-11 NOTE — Telephone Encounter (Signed)
 Ok I can re-order. But are all of his previous orders just obsolete and not gonna be done without a new order? That's concerning  Cc Margie

## 2023-09-12 DIAGNOSIS — L84 Corns and callosities: Secondary | ICD-10-CM | POA: Diagnosis not present

## 2023-09-12 DIAGNOSIS — B351 Tinea unguium: Secondary | ICD-10-CM | POA: Diagnosis not present

## 2023-09-12 DIAGNOSIS — E1142 Type 2 diabetes mellitus with diabetic polyneuropathy: Secondary | ICD-10-CM | POA: Diagnosis not present

## 2023-09-12 NOTE — Telephone Encounter (Signed)
 New CT order has been placed under [name]'s name

## 2023-09-13 DIAGNOSIS — M0589 Other rheumatoid arthritis with rheumatoid factor of multiple sites: Secondary | ICD-10-CM | POA: Diagnosis not present

## 2023-09-13 DIAGNOSIS — Z79899 Other long term (current) drug therapy: Secondary | ICD-10-CM | POA: Diagnosis not present

## 2023-09-18 ENCOUNTER — Encounter: Payer: Self-pay | Admitting: Obstetrics & Gynecology

## 2023-09-18 ENCOUNTER — Ambulatory Visit: Admitting: Obstetrics & Gynecology

## 2023-09-18 VITALS — BP 102/60 | Ht 62.0 in | Wt 247.0 lb

## 2023-09-18 DIAGNOSIS — N3010 Interstitial cystitis (chronic) without hematuria: Secondary | ICD-10-CM

## 2023-09-18 NOTE — Progress Notes (Signed)
 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    The external urethra meatus was prepped with betadine DMSO 50 cc was instilled in the usual fashion after the bladder was catheterized and emptied completely 50cc was instilled into the bladder without difficulty and the patient tolerated well She will refrain from voiding as long as possible  Follow up in 8 weeks, or as patient requests based on her symptom complex

## 2023-09-25 ENCOUNTER — Ambulatory Visit (HOSPITAL_COMMUNITY)
Admission: RE | Admit: 2023-09-25 | Discharge: 2023-09-25 | Disposition: A | Discharge status: Home / Self Care | Source: Ambulatory Visit | Attending: Primary Care | Admitting: Primary Care

## 2023-09-25 DIAGNOSIS — R911 Solitary pulmonary nodule: Secondary | ICD-10-CM | POA: Insufficient documentation

## 2023-09-25 DIAGNOSIS — R918 Other nonspecific abnormal finding of lung field: Secondary | ICD-10-CM | POA: Diagnosis not present

## 2023-09-25 DIAGNOSIS — R590 Localized enlarged lymph nodes: Secondary | ICD-10-CM | POA: Diagnosis not present

## 2023-09-27 DIAGNOSIS — E1129 Type 2 diabetes mellitus with other diabetic kidney complication: Secondary | ICD-10-CM | POA: Diagnosis not present

## 2023-09-27 DIAGNOSIS — J449 Chronic obstructive pulmonary disease, unspecified: Secondary | ICD-10-CM | POA: Diagnosis not present

## 2023-09-27 DIAGNOSIS — Z79899 Other long term (current) drug therapy: Secondary | ICD-10-CM | POA: Diagnosis not present

## 2023-10-03 DIAGNOSIS — E1122 Type 2 diabetes mellitus with diabetic chronic kidney disease: Secondary | ICD-10-CM | POA: Diagnosis not present

## 2023-10-03 DIAGNOSIS — E785 Hyperlipidemia, unspecified: Secondary | ICD-10-CM | POA: Diagnosis not present

## 2023-10-03 DIAGNOSIS — J309 Allergic rhinitis, unspecified: Secondary | ICD-10-CM | POA: Diagnosis not present

## 2023-10-03 DIAGNOSIS — R809 Proteinuria, unspecified: Secondary | ICD-10-CM | POA: Diagnosis not present

## 2023-10-10 DIAGNOSIS — H353134 Nonexudative age-related macular degeneration, bilateral, advanced atrophic with subfoveal involvement: Secondary | ICD-10-CM | POA: Diagnosis not present

## 2023-10-19 DIAGNOSIS — E1142 Type 2 diabetes mellitus with diabetic polyneuropathy: Secondary | ICD-10-CM | POA: Diagnosis not present

## 2023-10-25 DIAGNOSIS — M0589 Other rheumatoid arthritis with rheumatoid factor of multiple sites: Secondary | ICD-10-CM | POA: Diagnosis not present

## 2023-10-26 ENCOUNTER — Ambulatory Visit: Payer: Self-pay | Admitting: Primary Care

## 2023-10-26 DIAGNOSIS — R911 Solitary pulmonary nodule: Secondary | ICD-10-CM

## 2023-10-27 ENCOUNTER — Telehealth: Payer: Self-pay

## 2023-10-27 NOTE — Telephone Encounter (Signed)
 Pt is scheduled to see Landry Ferrari, NP for Monday, 10-30-23 for a f/u. I could not find pt in [facility]. I called and spoke to pt. Pt stated she is using her CPAP machine and she did buy a SD card. I informed pt that we would need her to bring in the sd card. Pt verbalized understanding. NFN

## 2023-10-30 ENCOUNTER — Ambulatory Visit: Admitting: Primary Care

## 2023-10-30 ENCOUNTER — Telehealth (HOSPITAL_BASED_OUTPATIENT_CLINIC_OR_DEPARTMENT_OTHER): Payer: Self-pay

## 2023-10-30 ENCOUNTER — Encounter: Payer: Self-pay | Admitting: Primary Care

## 2023-10-30 VITALS — BP 132/62 | HR 79 | Temp 97.7°F | Ht 62.0 in | Wt 245.2 lb

## 2023-10-30 DIAGNOSIS — R911 Solitary pulmonary nodule: Secondary | ICD-10-CM

## 2023-10-30 DIAGNOSIS — G4733 Obstructive sleep apnea (adult) (pediatric): Secondary | ICD-10-CM | POA: Diagnosis not present

## 2023-10-30 DIAGNOSIS — Z87891 Personal history of nicotine dependence: Secondary | ICD-10-CM

## 2023-10-30 DIAGNOSIS — J4489 Other specified chronic obstructive pulmonary disease: Secondary | ICD-10-CM

## 2023-10-30 NOTE — Progress Notes (Signed)
 @Patient  ID: [name], female    DOB: 05-22-1950, 73 y.o.   MRN: [id number]  No chief complaint on file.   Referring provider: Sheryle Carwin, MD  HPI: 73 year old female, former smoker. PMH significant for COPD, OSA, diabetes, RA, cardiac murmur, obesity. Former Dr. Shellia patient   Previous [facility] pulmonary encounter: 03/22/2023 Discussed the use of AI scribe software for clinical note transcription with the patient, who gave verbal consent to proceed.  History of Present Illness   The patient, primarily followed by Dr. Shellia for COPD, reports improved respiratory symptoms since starting Breztri . She notes a significant reduction in cough and sputum production, attributing the current increase in green sputum to a change in weather. Despite this, she has not been utilizing his chest percussion system, the vest, but has been using the flutter valve and Mucinex . The patient reports the onset of increased sputum production to be last week.  In addition to COPD, the patient is also being managed for sleep apnea with a CPAP machine. She reports nightly compliance but is experiencing issues with mask fit. The patient attributes this to recent weight loss and plans to switch back to a previous mask type with a new DME company [facility] at the end of the month. Compliance report in [place] is no up to date, she will bring machine by DME for download and we will follow-up in 4-6 weeks virtually. The patient has not had a sleep study since 2009.  The patient also expresses interest in the [facility] device for sleep apnea management but currently does not meet the BMI qualification. She is considering weight loss to become a candidate for the device.   Chronic Obstructive Pulmonary Disease (COPD) -Increased sputum production with color change to dark green. No worsening shortness of breath. Breztri  has improved symptoms overall. She is taking Singulair , mucinex  and using flutter valve as directed. Advised  patient resume chest percussion vest therapy twice daily. If no improvement in 2-3 days, start Augmentin  1 tab twice daily x 7 days. Check in with provider via MyChart regarding progress and whether antibiotic was needed.  Obstructive Sleep Apnea (OSA) - Patient reports compliance with CPAP therapy but issues with mask fit leading to perceived nocturnal hypoxia. Considering switch to previous mask type. Patient to establish with new DME company, [facility], end of the month for adjustment and supply replenishment. Request [facility] to fax compliance report to provider. Follow up in 4-6 weeks to assess CPAP compliance and effectiveness post-mask change.   05/05/2023 Discussed the use of AI scribe software for clinical note transcription with the patient, who gave verbal consent to proceed.  Patient contacted today for FU for COPD and OSA.  In December, she experienced a flare-up of chronic bronchitis with increased sputum production and color change to dark green. However, there was no change in her breathing. She held off on taking antibiotic as her congestion improved over the past weeks with no wheezing reported. He has been using a flutter valve and a percussion valve regularly. She recently received a new vest as the Velcro on the previous one wore out. She reported that Breztri  has improved his breathing somewhat overall.  She has, however, been experiencing sinus congestion for a little over a week, with dark, bloody mucus being expelled when blowing her nose. She has not taken any over-the-counter medication for this, except for Tylenol  for headaches. He has been using Flonase  every night and continues to take Singulair . She has a sinus rinse  available.  For sleep apnea, she has been using a CPAP machine every night without any issues. She recently change DME companies from [facility] to [facility]. She was able to receive a new mask from [facility], which has improved her experience, however, they  were unable to pull compliance report from her machine. She has had the current machine for four years and is due for an upgrade in about a year.       10/30/2023- Interim hx  Discussed the use of AI scribe software for clinical note transcription with the patient, who gave verbal consent to proceed.  History of Present Illness   [name] is a 73 year old female with COPD, chronic bronchitis, and sleep apnea who presents with a persistent cough and mucus production.  She experiences a persistent cough with thick, yellow mucus production, which is sometimes difficult to expectorate. The cough occurs at any time of day. There have been no recent flare-ups of COPD or increased shortness of breath. She uses Breztri , two puffs in the morning and evening, and Mucinex  as needed. She also utilizes a smart vest and flutter valve to manage mucus and has used her nebulizer and vest recently to help clear mucus.  Her sleep apnea is managed with a CPAP machine, specifically a DreamStation 2, which she uses every night without issues. She is due for a new machine next year, with supplies provided by [facility]. Unable to access compliance download today, patient purchased her own SD card.   She has a history of a pulmonary nodule that has been monitored over the past several years. She quit smoking in 1994. Her mother had lung cancer that metastasized. CT chest in Jubne 2025 showed 15 x 18 mm part solid right upper lobe lung nodule demonstrating a 6 mm solid component is stable when compared to remote prior examination of 09/17/2020, the slightly enlarged since more prior examinations. This establishes 3 years continued stability. However, these nodules should be considered highly suspicious if the solid component of the nodule is 6 mm or greater in size. She has been referred to multi disciplinary thoracic consult.   Her rheumatoid arthritis is controlled with methotrexate and Remicade , and she also takes folic  acid.      Allergies  Allergen Reactions   Demerol  Nausea And Vomiting    Severe   Hydromorphone Hcl Itching    All over the body.   Tetracycline Hives    Immunization History  Administered Date(s) Administered   Fluad Quad(high Dose 65+) 01/06/2020   Influenza Split 01/10/2012, 01/05/2013, 01/09/2017   Influenza Whole 01/31/2009, 01/09/2010, 01/09/2011   Influenza, High Dose Seasonal PF 12/28/2017   Influenza,inj,Quad PF,6+ Mos 12/10/2013, 12/23/2015   Influenza-Unspecified 12/26/2014, 01/17/2017, 12/22/2018, 02/04/2021   PFIZER Comirnaty(Gray Top)Covid-19 Tri-Sucrose Vaccine 08/04/2020   PFIZER(Purple Top)SARS-COV-2 Vaccination 05/25/2019, 06/17/2019, 12/10/2019, 02/14/2021   Pfizer Covid-19 Vaccine Bivalent Booster 61yrs & up 09/14/2021   Pneumococcal Conjugate-13 11/10/2015   Pneumococcal Polysaccharide-23 02/17/2008, 02/23/2017   Tetanus 03/30/2021   Zoster, Live 02/24/2021, 05/20/2021    Past Medical History:  Diagnosis Date   Asthma    COPD (chronic obstructive pulmonary disease) (HCC)    Depression    DM type 1 (diabetes mellitus, type 1) (HCC)    GERD (gastroesophageal reflux disease)    Hyperlipidemia    Hypertension    Morbid obesity (HCC)    Obstructive sleep apnea syndrome    Osteoarthritis of right knee 04/2010   end-stage   PONV (postoperative nausea  and vomiting)    Rheumatoid arthritis(714.0)    on methotrexate therapy   Septic arthritis of knee, right (HCC) 04/2010    Tobacco History: Social History   Tobacco Use  Smoking Status Former   Current packs/day: 0.00   Average packs/day: 2.0 packs/day for 25.0 years (50.0 ttl pk-yrs)   Types: Cigarettes   Start date: 04/12/1967   Quit date: 04/11/1992   Years since quitting: 31.5  Smokeless Tobacco Never   Counseling given: Not Answered   Outpatient Medications Prior to Visit  Medication Sig Dispense Refill   albuterol  (PROVENTIL ) (2.5 MG/3ML) 0.083% nebulizer solution Take 3 mLs (2.5 mg  total) by nebulization every 6 (six) hours as needed for wheezing or shortness of breath (dx: J43.9). (Patient not taking: Reported on 09/18/2023) 360 mL 5   albuterol  (VENTOLIN  HFA) 108 (90 Base) MCG/ACT inhaler USE 2 INHALATIONS EVERY 6 HOURS AS NEEDED FOR WHEEZING OR SHORTNESS OF BREATH (Patient not taking: Reported on 09/18/2023) 25.5 g 3   ALPRAZolam  (XANAX ) 0.5 MG tablet Take 0.5 mg by mouth 3 (three) times daily as needed for sleep or anxiety.      BENICAR  20 MG tablet Take 0.5 tablets (10 mg total) by mouth daily. 15 tablet 0   Budeson-Glycopyrrol-Formoterol  (BREZTRI  AEROSPHERE) 160-9-4.8 MCG/ACT AERO Inhale 2 puffs into the lungs in the morning and at bedtime. 3 each 3   carboxymethylcellulose (LUBRICANT EYE DROPS) 0.5 % SOLN Place 1 drop into both eyes 3 (three) times daily.     diclofenac  sodium (VOLTAREN ) 1 % GEL Apply 2-4 g topically 4 (four) times daily as needed for pain (Hand pain).     empagliflozin (JARDIANCE) 25 MG TABS tablet Take 25 mg by mouth daily.     Ferrous Sulfate (IRON PO) Take by mouth.     fluticasone  (FLONASE ) 50 MCG/ACT nasal spray Place 1 spray into both nostrils daily. 48 g 3   folic acid  (FOLVITE ) 1 MG tablet Take 1 mg by mouth daily.     gabapentin  (NEURONTIN ) 600 MG tablet Take 600 mg by mouth daily.     HUMALOG KWIKPEN 100 UNIT/ML KwikPen Inject 8-14 Units into the skin See admin instructions. Inject 8 units subcutaneously in the morning & inject 14 units subcutaneously at supper     InFLIXimab  (REMICADE  IV) Inject 1 Dose into the vein every 6 (six) weeks.      LANTUS  SOLOSTAR 100 UNIT/ML Solostar Pen Inject 10 Units into the skin daily. Inject 10 units into  the skin daily (Patient taking differently: Inject 10 Units into the skin daily. 60 units in the am and 80 units pm) 15 mL 1   Levocetirizine Dihydrochloride (XYZAL PO) Take by mouth.     meloxicam (MOBIC) 15 MG tablet Take 15 mg by mouth daily.     metFORMIN  (GLUCOPHAGE -XR) 500 MG 24 hr tablet Take 2,000 mg  by mouth daily.     methotrexate (RHEUMATREX) 2.5 MG tablet Take 2.5 mg by mouth once a week. Caution:Chemotherapy. Protect from light.     montelukast  (SINGULAIR ) 10 MG tablet Take 1 tablet (10 mg total) by mouth at bedtime. 90 tablet 3   omeprazole  (PRILOSEC) 40 MG capsule TAKE 1 CAPSULE EVERY MORNING 90 capsule 3   pentosan polysulfate (ELMIRON ) 100 MG capsule Take 2 tablets twice daily 360 capsule 3   sertraline  (ZOLOFT ) 100 MG tablet Take 100 mg by mouth daily.     simvastatin  (ZOCOR ) 40 MG tablet Take 40 mg by mouth daily with supper.  TRULICITY 0.75 MG/0.5ML SOPN Inject 0.75 mg into the skin once a week.     Urelle  (URELLE /URISED) 81 MG TABS tablet Take 1 tablet (81 mg total) by mouth 4 (four) times daily. 360 tablet 3   No facility-administered medications prior to visit.      Review of Systems  Review of Systems  Constitutional: Negative.   HENT:  Positive for congestion.   Respiratory:  Positive for cough. Negative for shortness of breath.   Cardiovascular: Negative.      Physical Exam  There were no vitals taken for this visit. Physical Exam Constitutional:      Appearance: Normal appearance.  HENT:     Head: Normocephalic and atraumatic.  Cardiovascular:     Rate and Rhythm: Normal rate and regular rhythm.  Pulmonary:     Effort: Pulmonary effort is normal.     Comments: Distant wheeze/rhonchi right upper-middle lobe  Musculoskeletal:        General: Normal range of motion.  Skin:    General: Skin is warm and dry.  Neurological:     General: No focal deficit present.     Mental Status: She is alert and oriented to person, place, and time. Mental status is at baseline.  Psychiatric:        Mood and Affect: Mood normal.        Behavior: Behavior normal.        Thought Content: Thought content normal.        Judgment: Judgment normal.      Lab Results:  CBC    Component Value Date/Time   WBC 5.9 07/24/2023 1502   RBC 5.51 (H) 07/24/2023 1502    HGB 16.2 (H) 07/24/2023 1502   HCT 48.3 (H) 07/24/2023 1502   PLT 180 07/24/2023 1502   MCV 87.7 07/24/2023 1502   MCH 29.4 07/24/2023 1502   MCHC 33.5 07/24/2023 1502   RDW 13.9 07/24/2023 1502   LYMPHSABS 1.2 07/24/2023 1502   MONOABS 0.9 07/24/2023 1502   EOSABS 0.3 07/24/2023 1502   BASOSABS 0.0 07/24/2023 1502    BMET    Component Value Date/Time   NA 135 07/24/2023 1502   K 4.2 07/24/2023 1502   CL 100 07/24/2023 1502   CO2 26 07/24/2023 1502   GLUCOSE 84 07/24/2023 1502   BUN 17 07/24/2023 1502   CREATININE 0.68 07/24/2023 1502   CREATININE 0.84 05/10/2016 1347   CALCIUM 9.7 07/24/2023 1502   GFRNONAA >60 07/24/2023 1502   GFRAA >60 07/30/2017 0346    BNP    Component Value Date/Time   BNP 24.7 07/24/2023 1502    ProBNP    Component Value Date/Time   PROBNP 40.5 02/15/2008 1205    Imaging: No results found.   Assessment & Plan:   1. COPD with chronic bronchitis (HCC) (Primary)  2. Obstructive sleep apnea  3. Solitary pulmonary nodule  Assessment and Plan    Chronic Obstructive Pulmonary Disease (COPD) Chronic cough with thick yellow mucus, no recent exacerbations. Distant wheezing and congestion noted on exam on the right side. No purulent sputum or fever. - Continue Breztri  Aerosphere, two puffs in the morning and two puffs in the evening. - Use smart vest and nebulizer one or two times a day to clear lungs. - Use Mucinex  as needed. - Report changes in sputum color or fever.  Pulmonary nodule Pulmonary nodule measuring 15 by 18 mm with a solid component, slightly enlarged but stable over three years. No prior biopsy. Family  history of lung cancer noted.  - Participate in multidisciplinary conference to discuss biopsy or removal options.  Obstructive Sleep Apnea Patient reports compliance with CPAP use nightly. Current machine is [employer] DreamStation 2, no issues with mask or machine. Due for a new machine next year. Supplies provided by  [facility]. Unable to access compliance reports from SD card - Continue using CPAP every night 4-6 hours or longer  - Plan for new CPAP machine next year, consider ResMed Airsense 10 or 11.  Rheumatoid Arthritis Rheumatoid arthritis controlled with methotrexate and Remicade . Takes folic acid  with methotrexate. - Continue methotrexate and Remicade . - Continue folic acid  supplementation.  Wendy LELON Ferrari, NP 10/30/2023

## 2023-10-30 NOTE — Telephone Encounter (Signed)
 Copied from CRM [id number]. Topic: Clinical - Medical Advice >> Oct 30, 2023  1:49 PM Rozanna MATSU wrote: Reason for CRM: [facility] [id number] CALLED STATED THEY ARE NOT ABLE TO DOWN LOAD SD CARD.

## 2023-10-30 NOTE — Patient Instructions (Signed)
  VISIT SUMMARY: Today, you were seen for a persistent cough with mucus production. We reviewed your COPD, chronic bronchitis, sleep apnea, and rheumatoid arthritis. We also discussed the monitoring of your pulmonary nodule and your current treatment plans.  YOUR PLAN: -CHRONIC OBSTRUCTIVE PULMONARY DISEASE (COPD): COPD is a chronic lung disease that makes it hard to breathe. You should continue using Breztri , two puffs in the morning and evening, and use your smart vest and nebulizer one or two times a day to help clear your lungs. Use Mucinex  as needed and report any changes in sputum color or if you develop a fever.  -PULMONARY NODULE: A pulmonary nodule is a small growth in the lung. Your nodule has been stable but slightly enlarged over the past three years. We will discuss biopsy or removal options in a multidisciplinary conference. If recommended, you may need a bronchoscopy for a biopsy or discuss surgical options with a cardiothoracic surgeon. The nodule is stable, but further evaluation is necessary.  -OBSTRUCTIVE SLEEP APNEA: Obstructive sleep apnea is a condition where your breathing stops and starts during sleep. Continue using your CPAP machine every night. You are due for a new machine next year, and we will consider options like the ResMed Airsense 10 or 11.  -RHEUMATOID ARTHRITIS: Rheumatoid arthritis is an autoimmune disease that causes joint inflammation. Your condition is well-controlled with methotrexate and Remicade . Continue taking these medications along with folic acid .  INSTRUCTIONS: Please report any changes in your sputum color or if you develop a fever. Participate in the multidisciplinary conference to discuss your pulmonary nodule. Continue using your CPAP machine every night and plan for a new machine next year. Follow your current medication regimen for rheumatoid arthritis.  Follow-up 6 months with Dr. Mauro new patient OSA and COPD

## 2023-10-31 NOTE — Telephone Encounter (Signed)
 Left a message at [facility] to see if they can reach out to pt to figure out how to get a download from her machine

## 2023-10-31 NOTE — Telephone Encounter (Signed)
 Can she bring her machine by and see if they can get a download?

## 2023-11-02 ENCOUNTER — Other Ambulatory Visit: Payer: Self-pay | Admitting: *Deleted

## 2023-11-02 NOTE — Progress Notes (Signed)
 The proposed treatment discussed in conference is for discussion purpose only and is not a binding recommendation.  The patients have not been physically examined, or presented with their treatment options.  Therefore, final treatment plans cannot be decided.

## 2023-11-07 ENCOUNTER — Telehealth: Payer: Self-pay

## 2023-11-07 ENCOUNTER — Ambulatory Visit: Admitting: Obstetrics & Gynecology

## 2023-11-07 ENCOUNTER — Encounter: Payer: Self-pay | Admitting: Obstetrics & Gynecology

## 2023-11-07 VITALS — BP 101/62 | Ht 62.0 in | Wt 245.0 lb

## 2023-11-07 DIAGNOSIS — N3010 Interstitial cystitis (chronic) without hematuria: Secondary | ICD-10-CM | POA: Diagnosis not present

## 2023-11-07 NOTE — Telephone Encounter (Signed)
 [place] with [facility] states they were unable to pull any data as the pt has a Hydrologist and [facility] only has [facility]med. SD card is ready to be mailed back to the pt.

## 2023-11-07 NOTE — Progress Notes (Signed)
 Diagnosed with IC: 06/2023  Current Meds:  Elmiron  + DMSO Dietary restrictions    Pt states her symptoms have been stable, no exacerbations, although not perfect Wants to continue on this cycle  Blood pressure 101/62, height 5' 2 (1.575 m), weight 245 lb (111.1 kg).    The external urethra meatus was prepped with betadine DMSO 50 cc was instilled in the usual fashion after the bladder was catheterized and emptied completely 50cc was instilled into the bladder without difficulty and the patient tolerated well She will refrain from voiding as long as possible  Follow up in 8 weeks, or as patient requests based on her symptom complex

## 2023-11-07 NOTE — Telephone Encounter (Signed)
 I called [facility]. They should be tagging our office in [place] for a compliance report. I am mailing the pt's SD card back to her. NFN

## 2023-11-09 NOTE — Progress Notes (Signed)
 I called and spoke to pt. Pt is informed of [name]'s note and verbalized understanding. Pt states the scan is scheduled for next Wednesday. NFN

## 2023-11-13 DIAGNOSIS — D2361 Other benign neoplasm of skin of right upper limb, including shoulder: Secondary | ICD-10-CM | POA: Diagnosis not present

## 2023-11-13 DIAGNOSIS — L821 Other seborrheic keratosis: Secondary | ICD-10-CM | POA: Diagnosis not present

## 2023-11-13 DIAGNOSIS — D1721 Benign lipomatous neoplasm of skin and subcutaneous tissue of right arm: Secondary | ICD-10-CM | POA: Diagnosis not present

## 2023-11-13 DIAGNOSIS — D692 Other nonthrombocytopenic purpura: Secondary | ICD-10-CM | POA: Diagnosis not present

## 2023-11-13 DIAGNOSIS — L720 Epidermal cyst: Secondary | ICD-10-CM | POA: Diagnosis not present

## 2023-11-13 DIAGNOSIS — D1801 Hemangioma of skin and subcutaneous tissue: Secondary | ICD-10-CM | POA: Diagnosis not present

## 2023-11-13 DIAGNOSIS — L814 Other melanin hyperpigmentation: Secondary | ICD-10-CM | POA: Diagnosis not present

## 2023-11-13 DIAGNOSIS — L98491 Non-pressure chronic ulcer of skin of other sites limited to breakdown of skin: Secondary | ICD-10-CM | POA: Diagnosis not present

## 2023-11-13 DIAGNOSIS — L7211 Pilar cyst: Secondary | ICD-10-CM | POA: Diagnosis not present

## 2023-11-15 ENCOUNTER — Encounter (HOSPITAL_COMMUNITY)
Admission: RE | Admit: 2023-11-15 | Discharge: 2023-11-15 | Disposition: A | Discharge status: Home / Self Care | Source: Ambulatory Visit | Attending: Primary Care | Admitting: Primary Care

## 2023-11-15 DIAGNOSIS — R911 Solitary pulmonary nodule: Secondary | ICD-10-CM | POA: Insufficient documentation

## 2023-11-15 DIAGNOSIS — M799 Soft tissue disorder, unspecified: Secondary | ICD-10-CM | POA: Diagnosis not present

## 2023-11-15 DIAGNOSIS — N200 Calculus of kidney: Secondary | ICD-10-CM | POA: Diagnosis not present

## 2023-11-15 DIAGNOSIS — I700 Atherosclerosis of aorta: Secondary | ICD-10-CM | POA: Diagnosis not present

## 2023-11-15 LAB — GLUCOSE, CAPILLARY: Glucose-Capillary: 93 mg/dL (ref 70–99)

## 2023-11-15 MED ORDER — FLUDEOXYGLUCOSE F - 18 (FDG) INJECTION
12.2000 | Freq: Once | INTRAVENOUS | Status: AC
  Administered 2023-11-15: 12.2 via INTRAVENOUS

## 2023-11-21 DIAGNOSIS — B351 Tinea unguium: Secondary | ICD-10-CM | POA: Diagnosis not present

## 2023-11-21 DIAGNOSIS — L84 Corns and callosities: Secondary | ICD-10-CM | POA: Diagnosis not present

## 2023-11-21 DIAGNOSIS — E1142 Type 2 diabetes mellitus with diabetic polyneuropathy: Secondary | ICD-10-CM | POA: Diagnosis not present

## 2023-11-28 ENCOUNTER — Ambulatory Visit: Payer: Self-pay | Admitting: Primary Care

## 2023-11-28 NOTE — Progress Notes (Signed)
 Pet scan showed lung nodule had no significant update, this is reassuring. Continue routine surveillance. No worrisome pulmonary lesions or adenopathy. We can discuss timing of follo-wup at her next OV in September.

## 2023-11-30 DIAGNOSIS — H00025 Hordeolum internum left lower eyelid: Secondary | ICD-10-CM | POA: Diagnosis not present

## 2023-12-04 DIAGNOSIS — H353134 Nonexudative age-related macular degeneration, bilateral, advanced atrophic with subfoveal involvement: Secondary | ICD-10-CM | POA: Diagnosis not present

## 2023-12-04 NOTE — Progress Notes (Signed)
Spoke with pt and notified of results per [name].  Pt verbalized understanding and denied any questions. 

## 2023-12-07 DIAGNOSIS — Z111 Encounter for screening for respiratory tuberculosis: Secondary | ICD-10-CM | POA: Diagnosis not present

## 2023-12-07 DIAGNOSIS — R5383 Other fatigue: Secondary | ICD-10-CM | POA: Diagnosis not present

## 2023-12-07 DIAGNOSIS — M0589 Other rheumatoid arthritis with rheumatoid factor of multiple sites: Secondary | ICD-10-CM | POA: Diagnosis not present

## 2023-12-07 DIAGNOSIS — Z79899 Other long term (current) drug therapy: Secondary | ICD-10-CM | POA: Diagnosis not present

## 2023-12-12 DIAGNOSIS — H353231 Exudative age-related macular degeneration, bilateral, with active choroidal neovascularization: Secondary | ICD-10-CM | POA: Diagnosis not present

## 2023-12-26 DIAGNOSIS — E1142 Type 2 diabetes mellitus with diabetic polyneuropathy: Secondary | ICD-10-CM | POA: Diagnosis not present

## 2023-12-26 DIAGNOSIS — R234 Changes in skin texture: Secondary | ICD-10-CM | POA: Diagnosis not present

## 2024-01-01 ENCOUNTER — Ambulatory Visit: Admitting: Obstetrics & Gynecology

## 2024-01-01 VITALS — BP 112/68 | HR 74

## 2024-01-01 DIAGNOSIS — N3010 Interstitial cystitis (chronic) without hematuria: Secondary | ICD-10-CM | POA: Diagnosis not present

## 2024-01-01 NOTE — Progress Notes (Signed)
 Diagnosed with IC: 06/2023  Current Meds:  Elmiron  + urelle  + DMSO Dietary restrictions    Pt states her symptoms have been stable, no exacerbations, although not perfect Wants to continue on this cycle  Blood pressure 112/68, pulse 74.    The external urethra meatus was prepped with betadine DMSO 50 cc was instilled in the usual fashion after the bladder was catheterized and emptied completely 50cc was instilled into the bladder without difficulty and the patient tolerated well She will refrain from voiding as long as possible  Follow up in 8 weeks, or as patient requests based on her symptom complex

## 2024-01-02 ENCOUNTER — Telehealth: Payer: Self-pay

## 2024-01-02 NOTE — Telephone Encounter (Signed)
 Pt is scheduled to see Almarie Ferrari, NP tomorrow at 10:00 am. I could not find pt in [place]. I called and spoke to pt. Pt states she does have an sd card and she will bring this with her to her appt tomorrow. NFN

## 2024-01-03 ENCOUNTER — Telehealth: Payer: Self-pay

## 2024-01-03 ENCOUNTER — Ambulatory Visit: Admitting: Primary Care

## 2024-01-03 ENCOUNTER — Telehealth: Payer: Self-pay | Admitting: Primary Care

## 2024-01-03 ENCOUNTER — Encounter: Payer: Self-pay | Admitting: Primary Care

## 2024-01-03 VITALS — BP 126/64 | HR 78 | Temp 97.7°F | Ht 62.0 in | Wt 248.8 lb

## 2024-01-03 DIAGNOSIS — R911 Solitary pulmonary nodule: Secondary | ICD-10-CM | POA: Diagnosis not present

## 2024-01-03 DIAGNOSIS — G4733 Obstructive sleep apnea (adult) (pediatric): Secondary | ICD-10-CM | POA: Diagnosis not present

## 2024-01-03 DIAGNOSIS — Z23 Encounter for immunization: Secondary | ICD-10-CM | POA: Diagnosis not present

## 2024-01-03 DIAGNOSIS — J439 Emphysema, unspecified: Secondary | ICD-10-CM | POA: Diagnosis not present

## 2024-01-03 NOTE — Telephone Encounter (Signed)
 I called [facility]. They do not have anything past December 2024 so I gave them [facility] fax number so the provider can still have a copy of it. NFN

## 2024-01-03 NOTE — Telephone Encounter (Signed)
 Called and spoke with the patient regarding [name]'s note.  Pt has location and phone number information for [facility]. Pt verbalized understanding and will contact [facility]. Nothing further needed.

## 2024-01-03 NOTE — Patient Instructions (Addendum)
  PET scan was negative, no evidence of malignancy Recommend follow-up imaging in 1 year Continue to wear CPAP nightly, due for new machine in March  We will contact [facility] in [place]  about getting compliance report from your dream station machine to review   Orders: Flu vaccine   Follow-up 6 months with Dr. Neda (3min-new patient visit)

## 2024-01-03 NOTE — Progress Notes (Signed)
 @Patient  ID: [name], female    DOB: 01/06/1951, 73 y.o.   MRN: [id number]  Chief Complaint  Patient presents with   Obstructive Sleep Apnea    Cpap f/u.     Referring provider: Sheryle Carwin, MD  HPI: 73 year old female, former smoker quit in 1994. PMH significant for COPD, OSA, diabetes, RA, cardiac murmur, obesity. Former Dr. Shellia patient   Previous [facility] pulmonary encounter: 03/22/2023 Discussed the use of AI scribe software for clinical note transcription with the patient, who gave verbal consent to proceed.  History of Present Illness   The patient, primarily followed by Dr. Shellia for COPD, reports improved respiratory symptoms since starting Breztri . She notes a significant reduction in cough and sputum production, attributing the current increase in green sputum to a change in weather. Despite this, she has not been utilizing his chest percussion system, the vest, but has been using the flutter valve and Mucinex . The patient reports the onset of increased sputum production to be last week.  In addition to COPD, the patient is also being managed for sleep apnea with a CPAP machine. She reports nightly compliance but is experiencing issues with mask fit. The patient attributes this to recent weight loss and plans to switch back to a previous mask type with a new DME company [facility] at the end of the month. Compliance report in airview is no up to date, she will bring machine by DME for download and we will follow-up in 4-6 weeks virtually. The patient has not had a sleep study since 2009.  The patient also expresses interest in the [facility] device for sleep apnea management but currently does not meet the BMI qualification. She is considering weight loss to become a candidate for the device.   Chronic Obstructive Pulmonary Disease (COPD) -Increased sputum production with color change to dark green. No worsening shortness of breath. Breztri  has improved symptoms overall. She is taking  Singulair , mucinex  and using flutter valve as directed. Advised patient resume chest percussion vest therapy twice daily. If no improvement in 2-3 days, start Augmentin  1 tab twice daily x 7 days. Check in with provider via MyChart regarding progress and whether antibiotic was needed.  Obstructive Sleep Apnea (OSA) - Patient reports compliance with CPAP therapy but issues with mask fit leading to perceived nocturnal hypoxia. Considering switch to previous mask type. Patient to establish with new DME company, [facility], end of the month for adjustment and supply replenishment. Request [facility] to fax compliance report to provider. Follow up in 4-6 weeks to assess CPAP compliance and effectiveness post-mask change.   05/05/2023 Discussed the use of AI scribe software for clinical note transcription with the patient, who gave verbal consent to proceed.  Patient contacted today for FU for COPD and OSA.  In December, she experienced a flare-up of chronic bronchitis with increased sputum production and color change to dark green. However, there was no change in her breathing. She held off on taking antibiotic as her congestion improved over the past weeks with no wheezing reported. He has been using a flutter valve and a percussion valve regularly. She recently received a new vest as the Velcro on the previous one wore out. She reported that Breztri  has improved his breathing somewhat overall.  She has, however, been experiencing sinus congestion for a little over a week, with dark, bloody mucus being expelled when blowing her nose. She has not taken any over-the-counter medication for this, except for Tylenol  for headaches. He  has been using Flonase  every night and continues to take Singulair . She has a sinus rinse available.  For sleep apnea, she has been using a CPAP machine every night without any issues. She recently change DME companies from [facility] to [facility]. She was able to receive a new mask  from [facility], which has improved her experience, however, they were unable to pull compliance report from her machine. She has had the current machine for four years and is due for an upgrade in about a year.       10/30/2023 Discussed the use of AI scribe software for clinical note transcription with the patient, who gave verbal consent to proceed.  History of Present Illness   [name] is a 73 year old female with COPD, chronic bronchitis, and sleep apnea who presents with a persistent cough and mucus production.  She experiences a persistent cough with thick, yellow mucus production, which is sometimes difficult to expectorate. The cough occurs at any time of day. There have been no recent flare-ups of COPD or increased shortness of breath. She uses Breztri , two puffs in the morning and evening, and Mucinex  as needed. She also utilizes a smart vest and flutter valve to manage mucus and has used her nebulizer and vest recently to help clear mucus.  Her sleep apnea is managed with a CPAP machine, specifically a DreamStation 2, which she uses every night without issues. She is due for a new machine next year, with supplies provided by [facility]. Unable to access compliance download today, patient purchased her own SD card.   She has a history of a pulmonary nodule that has been monitored over the past several years. She quit smoking in 1994. Her mother had lung cancer that metastasized. CT chest in Jubne 2025 showed 15 x 18 mm part solid right upper lobe lung nodule demonstrating a 6 mm solid component is stable when compared to remote prior examination of 09/17/2020, the slightly enlarged since more prior examinations. This establishes 3 years continued stability. However, these nodules should be considered highly suspicious if the solid component of the nodule is 6 mm or greater in size. She has been referred to multi disciplinary thoracic consult.   Her rheumatoid arthritis is controlled with  methotrexate and Remicade , and she also takes folic acid .      01/03/2024- Interim hx  Discussed the use of AI scribe software for clinical note transcription with the patient, who gave verbal consent to proceed.  History of Present Illness [name] is a 73 year old female with a lung nodule who presents for follow-up after a PET scan.  She recently underwent a PET scan in August which showed that the left upper lobe ground glass nodule did not demonstrate any hypermetabolic activity, SUV uptake value was 1.1.  She uses a CPAP machine nightly for sleep apnea, specifically the [employer] Dream Station 2, for approximately six to seven and a half hours each night. Occasionally, she feels the pressure is too strong during the initial ramping phase, but it becomes manageable shortly after. She uses a nasal mask with the CPAP.  She has a history of smoking but quit in 1994. She reports a chronic cough, stating 'I always cough something up.' She uses Breztri  as a maintenance inhaler but has not experienced any worsening symptoms recently.  No coughing up blood, unexplained weight loss, night sweats, or fevers.       Allergies  Allergen Reactions   Demerol  Nausea And  Vomiting    Severe   Hydromorphone Hcl Itching    All over the body.   Tetracycline Hives    Immunization History  Administered Date(s) Administered   Fluad Quad(high Dose 65+) 01/06/2020   INFLUENZA, HIGH DOSE SEASONAL PF 12/28/2017   Influenza Split 01/10/2012, 01/05/2013, 01/09/2017   Influenza Whole 01/31/2009, 01/09/2010, 01/09/2011   Influenza,inj,Quad PF,6+ Mos 12/10/2013, 12/23/2015   Influenza-Unspecified 12/26/2014, 01/17/2017, 12/22/2018, 02/04/2021   PFIZER Comirnaty(Gray Top)Covid-19 Tri-Sucrose Vaccine 08/04/2020   PFIZER(Purple Top)SARS-COV-2 Vaccination 05/25/2019, 06/17/2019, 12/10/2019, 02/14/2021   Pfizer Covid-19 Vaccine Bivalent Booster 41yrs & up 09/14/2021   Pneumococcal Conjugate-13 11/10/2015    Pneumococcal Polysaccharide-23 02/17/2008, 02/23/2017   Tetanus 03/30/2021   Zoster, Live 02/24/2021, 05/20/2021    Past Medical History:  Diagnosis Date   Asthma    COPD (chronic obstructive pulmonary disease) (HCC)    Depression    DM type 1 (diabetes mellitus, type 1) (HCC)    GERD (gastroesophageal reflux disease)    Hyperlipidemia    Hypertension    Morbid obesity (HCC)    Obstructive sleep apnea syndrome    Osteoarthritis of right knee 04/2010   end-stage   PONV (postoperative nausea and vomiting)    Rheumatoid arthritis(714.0)    on methotrexate therapy   Septic arthritis of knee, right (HCC) 04/2010    Tobacco History: Social History   Tobacco Use  Smoking Status Former   Current packs/day: 0.00   Average packs/day: 2.0 packs/day for 25.0 years (50.0 ttl pk-yrs)   Types: Cigarettes   Start date: 04/12/1967   Quit date: 04/11/1992   Years since quitting: 31.7  Smokeless Tobacco Never   Counseling given: Not Answered   Outpatient Medications Prior to Visit  Medication Sig Dispense Refill   albuterol  (PROVENTIL ) (2.5 MG/3ML) 0.083% nebulizer solution Take 3 mLs (2.5 mg total) by nebulization every 6 (six) hours as needed for wheezing or shortness of breath (dx: J43.9). 360 mL 5   albuterol  (VENTOLIN  HFA) 108 (90 Base) MCG/ACT inhaler USE 2 INHALATIONS EVERY 6 HOURS AS NEEDED FOR WHEEZING OR SHORTNESS OF BREATH 25.5 g 3   ALPRAZolam  (XANAX ) 0.5 MG tablet Take 0.5 mg by mouth 3 (three) times daily as needed for sleep or anxiety.      BENICAR  20 MG tablet Take 0.5 tablets (10 mg total) by mouth daily. 15 tablet 0   Budeson-Glycopyrrol-Formoterol  (BREZTRI  AEROSPHERE) 160-9-4.8 MCG/ACT AERO Inhale 2 puffs into the lungs in the morning and at bedtime. 3 each 3   carboxymethylcellulose (LUBRICANT EYE DROPS) 0.5 % SOLN Place 1 drop into both eyes 3 (three) times daily.     diclofenac  sodium (VOLTAREN ) 1 % GEL Apply 2-4 g topically 4 (four) times daily as needed for pain  (Hand pain).     empagliflozin (JARDIANCE) 25 MG TABS tablet Take 25 mg by mouth daily.     fluticasone  (FLONASE ) 50 MCG/ACT nasal spray Place 1 spray into both nostrils daily. 48 g 3   folic acid  (FOLVITE ) 1 MG tablet Take 1 mg by mouth daily.     gabapentin  (NEURONTIN ) 600 MG tablet Take 600 mg by mouth daily.     HUMALOG KWIKPEN 100 UNIT/ML KwikPen Inject 8-14 Units into the skin See admin instructions. Inject 8 units subcutaneously in the morning & inject 14 units subcutaneously at supper     InFLIXimab  (REMICADE  IV) Inject 1 Dose into the vein every 6 (six) weeks.      LANTUS  SOLOSTAR 100 UNIT/ML Solostar Pen Inject 10 Units into the  skin daily. Inject 10 units into  the skin daily 15 mL 1   Levocetirizine Dihydrochloride (XYZAL PO) Take by mouth.     meloxicam (MOBIC) 15 MG tablet Take 15 mg by mouth daily.     metFORMIN  (GLUCOPHAGE -XR) 500 MG 24 hr tablet Take 2,000 mg by mouth daily.     methotrexate (RHEUMATREX) 2.5 MG tablet Take 2.5 mg by mouth once a week. Caution:Chemotherapy. Protect from light.     montelukast  (SINGULAIR ) 10 MG tablet Take 1 tablet (10 mg total) by mouth at bedtime. 90 tablet 3   omeprazole  (PRILOSEC) 40 MG capsule TAKE 1 CAPSULE EVERY MORNING 90 capsule 3   pentosan polysulfate (ELMIRON ) 100 MG capsule Take 2 tablets twice daily 360 capsule 3   sertraline  (ZOLOFT ) 100 MG tablet Take 100 mg by mouth daily.     simvastatin  (ZOCOR ) 40 MG tablet Take 40 mg by mouth daily with supper.     TRULICITY 0.75 MG/0.5ML SOPN Inject 0.75 mg into the skin once a week.     Urelle  (URELLE /URISED) 81 MG TABS tablet Take 1 tablet (81 mg total) by mouth 4 (four) times daily. 360 tablet 3   No facility-administered medications prior to visit.   Review of Systems  Review of Systems  Constitutional: Negative.  Negative for chills, fatigue and unexpected weight change.  Respiratory:  Positive for cough. Negative for shortness of breath and wheezing.   Cardiovascular: Negative.       Physical Exam  BP 126/64   Pulse 78   Temp 97.7 F (36.5 C)   Ht 5' 2 (1.575 m)   Wt 248 lb 12.8 oz (112.9 kg)   SpO2 94% Comment: RA  BMI 45.51 kg/m  Physical Exam Constitutional:      Appearance: Normal appearance. She is well-developed.  HENT:     Head: Normocephalic and atraumatic.     Mouth/Throat:     Mouth: Mucous membranes are moist.     Pharynx: Oropharynx is clear.  Eyes:     Pupils: Pupils are equal, round, and reactive to light.  Cardiovascular:     Rate and Rhythm: Normal rate and regular rhythm.     Heart sounds: Normal heart sounds. No murmur heard. Pulmonary:     Effort: Pulmonary effort is normal. No respiratory distress.     Breath sounds: Wheezing present. No rhonchi.  Abdominal:     General: Bowel sounds are normal.     Palpations: Abdomen is soft.     Tenderness: There is no abdominal tenderness.  Musculoskeletal:        General: Normal range of motion.     Cervical back: Normal range of motion and neck supple.  Skin:    General: Skin is warm and dry.     Findings: No erythema or rash.  Neurological:     General: No focal deficit present.     Mental Status: She is alert and oriented to person, place, and time. Mental status is at baseline.  Psychiatric:        Mood and Affect: Mood normal.        Behavior: Behavior normal.        Thought Content: Thought content normal.        Judgment: Judgment normal.     Lab Results:  CBC    Component Value Date/Time   WBC 5.9 07/24/2023 1502   RBC 5.51 (H) 07/24/2023 1502   HGB 16.2 (H) 07/24/2023 1502   HCT 48.3 (H) 07/24/2023 1502  PLT 180 07/24/2023 1502   MCV 87.7 07/24/2023 1502   MCH 29.4 07/24/2023 1502   MCHC 33.5 07/24/2023 1502   RDW 13.9 07/24/2023 1502   LYMPHSABS 1.2 07/24/2023 1502   MONOABS 0.9 07/24/2023 1502   EOSABS 0.3 07/24/2023 1502   BASOSABS 0.0 07/24/2023 1502    BMET    Component Value Date/Time   NA 135 07/24/2023 1502   K 4.2 07/24/2023 1502   CL 100  07/24/2023 1502   CO2 26 07/24/2023 1502   GLUCOSE 84 07/24/2023 1502   BUN 17 07/24/2023 1502   CREATININE 0.68 07/24/2023 1502   CREATININE 0.84 05/10/2016 1347   CALCIUM 9.7 07/24/2023 1502   GFRNONAA >60 07/24/2023 1502   GFRAA >60 07/30/2017 0346    BNP    Component Value Date/Time   BNP 24.7 07/24/2023 1502    ProBNP    Component Value Date/Time   PROBNP 40.5 02/15/2008 1205    Imaging: No results found.   Assessment & Plan:   1. Obstructive sleep apnea (Primary)  2. Pulmonary emphysema, unspecified emphysema type  Assessment and Plan Assessment & Plan Obstructive sleep apnea Obstructive sleep apnea managed with CPAP. She uses a [employer] Station 2 CPAP machine nightly for 6 to 7.5 hours. Cpap pressure 6cm h20; Residual AHI 6.0/hour. Reports pressure sometimes feels too strong during the initial ramping phase but is manageable. Uses a nasal mask and is due for a replacement machine in March. - Contact [facility] in [place] to resolve SD card data issue. Last download is from Nov-Dec 2024. I will ask patient to bring CPAP by [facility] prior to her next visit in March.  - Schedule follow-up with Dr. Neda in March to discuss CPAP machine replacement.  Pulmonary nodule, left upper lobe Left upper lobe ground glass nodule under surveillance. Recent PET scan showed no hypermetabolic activity with a standard uptake value of 1.1, not concerning for malignancy. No other worrisome pulmonary lesions or adenopathy. - Order repeat CT chest in one year. - Advise to report symptoms such as hemoptysis, unexplained weight loss, night sweats, or fevers.  Emphysema Emphysema with chronic cough. Uses Breztri  as a maintenance inhaler and reports no worsening symptoms or active respiratory distress. Mild wheezing noted on examination. - Continue Breztri  as maintenance inhaler. - Monitor for any changes in respiratory symptoms.  Recording duration: 12 minutes   Almarie LELON Ferrari, NP 01/03/2024

## 2024-01-03 NOTE — Telephone Encounter (Signed)
 We got a download from [facility] in [place], last report is from Nov-Dec 2024 (this could be due to 3G cut off). Please ask patient to bring CPAP machine by [facility] prior to her next visit in March. Please provide patient with [facility] contact information and address.

## 2024-01-09 DIAGNOSIS — H353134 Nonexudative age-related macular degeneration, bilateral, advanced atrophic with subfoveal involvement: Secondary | ICD-10-CM | POA: Diagnosis not present

## 2024-01-11 DIAGNOSIS — E1129 Type 2 diabetes mellitus with other diabetic kidney complication: Secondary | ICD-10-CM | POA: Diagnosis not present

## 2024-01-11 DIAGNOSIS — Z79899 Other long term (current) drug therapy: Secondary | ICD-10-CM | POA: Diagnosis not present

## 2024-01-15 ENCOUNTER — Other Ambulatory Visit: Payer: Self-pay | Admitting: Internal Medicine

## 2024-01-16 DIAGNOSIS — I10 Essential (primary) hypertension: Secondary | ICD-10-CM | POA: Diagnosis not present

## 2024-01-16 DIAGNOSIS — R601 Generalized edema: Secondary | ICD-10-CM | POA: Diagnosis not present

## 2024-01-16 DIAGNOSIS — E1129 Type 2 diabetes mellitus with other diabetic kidney complication: Secondary | ICD-10-CM | POA: Diagnosis not present

## 2024-01-16 DIAGNOSIS — F410 Panic disorder [episodic paroxysmal anxiety] without agoraphobia: Secondary | ICD-10-CM | POA: Diagnosis not present

## 2024-01-18 DIAGNOSIS — M0589 Other rheumatoid arthritis with rheumatoid factor of multiple sites: Secondary | ICD-10-CM | POA: Diagnosis not present

## 2024-01-30 DIAGNOSIS — B351 Tinea unguium: Secondary | ICD-10-CM | POA: Diagnosis not present

## 2024-01-30 DIAGNOSIS — E1142 Type 2 diabetes mellitus with diabetic polyneuropathy: Secondary | ICD-10-CM | POA: Diagnosis not present

## 2024-01-30 DIAGNOSIS — L84 Corns and callosities: Secondary | ICD-10-CM | POA: Diagnosis not present

## 2024-02-01 NOTE — Telephone Encounter (Signed)
 duplicate

## 2024-02-05 ENCOUNTER — Other Ambulatory Visit: Payer: Self-pay | Admitting: Internal Medicine

## 2024-02-05 DIAGNOSIS — G4733 Obstructive sleep apnea (adult) (pediatric): Secondary | ICD-10-CM

## 2024-02-05 DIAGNOSIS — R051 Acute cough: Secondary | ICD-10-CM

## 2024-02-13 DIAGNOSIS — Z961 Presence of intraocular lens: Secondary | ICD-10-CM | POA: Diagnosis not present

## 2024-02-13 DIAGNOSIS — H43813 Vitreous degeneration, bilateral: Secondary | ICD-10-CM | POA: Diagnosis not present

## 2024-02-13 DIAGNOSIS — E119 Type 2 diabetes mellitus without complications: Secondary | ICD-10-CM | POA: Diagnosis not present

## 2024-02-13 DIAGNOSIS — H353133 Nonexudative age-related macular degeneration, bilateral, advanced atrophic without subfoveal involvement: Secondary | ICD-10-CM | POA: Diagnosis not present

## 2024-02-13 DIAGNOSIS — H353231 Exudative age-related macular degeneration, bilateral, with active choroidal neovascularization: Secondary | ICD-10-CM | POA: Diagnosis not present

## 2024-02-13 DIAGNOSIS — H35033 Hypertensive retinopathy, bilateral: Secondary | ICD-10-CM | POA: Diagnosis not present

## 2024-02-26 ENCOUNTER — Ambulatory Visit (INDEPENDENT_AMBULATORY_CARE_PROVIDER_SITE_OTHER): Admitting: Obstetrics & Gynecology

## 2024-02-26 ENCOUNTER — Encounter: Payer: Self-pay | Admitting: Obstetrics & Gynecology

## 2024-02-26 VITALS — BP 115/69 | HR 90 | Ht 62.0 in | Wt 249.0 lb

## 2024-02-26 DIAGNOSIS — B3731 Acute candidiasis of vulva and vagina: Secondary | ICD-10-CM | POA: Diagnosis not present

## 2024-02-26 DIAGNOSIS — N3010 Interstitial cystitis (chronic) without hematuria: Secondary | ICD-10-CM

## 2024-02-26 MED ORDER — ZINC OXIDE 20 % EX OINT: TOPICAL_OINTMENT | Freq: Three times a day (TID) | CUTANEOUS | Status: AC | PRN

## 2024-02-26 MED ORDER — FLUCONAZOLE 100 MG PO TABS: ORAL_TABLET | ORAL | 2 refills | Status: DC

## 2024-02-26 NOTE — Progress Notes (Signed)
 Diagnosed with IC:    ICD-10-CM   1. Interstitial cystitis: DMSO irrigation + urelle  + elmiron  today: Dx 06/2023  N30.10    treated today    2. Vulvovaginitis due to yeast: treated during visit with gentian violet, Rx diflucan , fanny cream  B37.31        Current Meds:  urelle  Dietary restrictions    Pt states her symptoms have been stable, no exacerbations, although not perfect Wants to continue on this cycle  Blood pressure 115/69, pulse 90, height 5' 2 (1.575 m), weight 249 lb (112.9 kg).    The external urethra meatus was prepped with betadine DMSO 50 cc was instilled in the usual fashion after the bladder was catheterized and emptied completely 50cc was instilled into the bladder without difficulty and the patient tolerated well She will refrain from voiding as long as possible  Follow up in 8 weeks, or as patient requests based on her symptom complex  Severe vulvovaginal yeast from jardiance (SGLT-2 inhibitor): tretaed with GV + long diflucan  + fanny cream

## 2024-02-27 DIAGNOSIS — M0589 Other rheumatoid arthritis with rheumatoid factor of multiple sites: Secondary | ICD-10-CM | POA: Diagnosis not present

## 2024-02-27 DIAGNOSIS — Z6841 Body Mass Index (BMI) 40.0 and over, adult: Secondary | ICD-10-CM | POA: Diagnosis not present

## 2024-02-27 DIAGNOSIS — M1991 Primary osteoarthritis, unspecified site: Secondary | ICD-10-CM | POA: Diagnosis not present

## 2024-02-27 DIAGNOSIS — Z79899 Other long term (current) drug therapy: Secondary | ICD-10-CM | POA: Diagnosis not present

## 2024-02-27 DIAGNOSIS — M545 Low back pain, unspecified: Secondary | ICD-10-CM | POA: Diagnosis not present

## 2024-02-27 DIAGNOSIS — B372 Candidiasis of skin and nail: Secondary | ICD-10-CM | POA: Diagnosis not present

## 2024-02-27 DIAGNOSIS — L299 Pruritus, unspecified: Secondary | ICD-10-CM | POA: Diagnosis not present

## 2024-02-28 ENCOUNTER — Ambulatory Visit (HOSPITAL_BASED_OUTPATIENT_CLINIC_OR_DEPARTMENT_OTHER): Payer: Self-pay | Admitting: Primary Care

## 2024-02-28 NOTE — Telephone Encounter (Signed)
 Pt requesting rx be sent

## 2024-02-28 NOTE — Telephone Encounter (Signed)
  FYI Only or Action Required?: Action required by provider: clinical question for provider and update on patient condition.  Patient is followed in Pulmonology for Results Follow-up, last seen on 01/03/2024 by Hope Almarie ORN, NP.  Called Nurse Triage reporting Cough.  Symptoms began x 2 days .  Interventions attempted: Rescue inhaler and Nebulizer treatments.  Symptoms are: unchanged.  Triage Disposition: See HCP Within 4 Hours (Or PCP Triage)  Patient/caregiver understands and will follow disposition?: No, wishes to speak with PCP      Copied from CRM #[id number]. Topic: Clinical - Red Word Triage >> Feb 28, 2024  8:32 AM Benton KIDD wrote: [facility] that prompted transfer to Nurse Triage: almarie hope told me if i have symptoms of congestion . congestion in left lung using my flutter valve and fever 99.9 Reason for Disposition  Wheezing is present  Answer Assessment - Initial Assessment Questions 1. ONSET: When did the cough begin?      X 2 days   2. SEVERITY: How bad is the cough today?      Intermittent, moderate    3. SPUTUM: Describe the color of your sputum (e.g., none, dry cough; clear, white, yellow, green)     Green, grayish  4. HEMOPTYSIS: Are you coughing up any blood? If Yes, ask: How much? (e.g., flecks, streaks, tablespoons, etc.)     No   5. DIFFICULTY BREATHING: Are you having difficulty breathing? If Yes, ask: How bad is it? (e.g., mild, moderate, severe)       SOB with movement, while walking the dogs   6. FEVER: Do you have a fever? If Yes, ask: What is your temperature, how was it measured, and when did it start?     99.9 this morning   7. CARDIAC HISTORY: Do you have any history of heart disease? (e.g., heart attack, congestive heart failure)      No   8. LUNG HISTORY: Do you have any history of lung disease?  (e.g., pulmonary embolus, asthma, emphysema)  COPD, asthma   10. OTHER SYMPTOMS: Do you have any other  symptoms? (e.g., runny nose, wheezing, chest pain)  Runny nose. Mild left sided wheezing.    Patient is using Mucinex  for the cough as well as her rescue inhaler. Patient stated she was told by the provider to update her condition and she would call in some medication for the symptoms without an OV. Please advise.  Protocols used: Cough - Acute Productive-A-AH

## 2024-02-29 MED ORDER — AMOXICILLIN-POT CLAVULANATE 875-125 MG PO TABS: 1.0000 | ORAL_TABLET | Freq: Two times a day (BID) | ORAL | 0 refills | Status: DC

## 2024-02-29 NOTE — Telephone Encounter (Signed)
 I'll send in a Augmentin  x 7 days. Take mucinex -dm twice daily

## 2024-02-29 NOTE — Telephone Encounter (Signed)
 I called and spoke to pt. Pt informed of [name]'s note and pt verbalized understanding. NFN

## 2024-03-01 ENCOUNTER — Inpatient Hospital Stay (HOSPITAL_BASED_OUTPATIENT_CLINIC_OR_DEPARTMENT_OTHER)
Admission: EM | Admit: 2024-03-01 | Discharge: 2024-03-04 | DRG: 871 | Disposition: A | Discharge status: Home / Self Care | Attending: Internal Medicine | Admitting: Internal Medicine

## 2024-03-01 ENCOUNTER — Other Ambulatory Visit: Payer: Self-pay

## 2024-03-01 ENCOUNTER — Emergency Department (HOSPITAL_BASED_OUTPATIENT_CLINIC_OR_DEPARTMENT_OTHER): Admitting: Radiology

## 2024-03-01 ENCOUNTER — Encounter (HOSPITAL_COMMUNITY): Payer: Self-pay | Admitting: Internal Medicine

## 2024-03-01 DIAGNOSIS — Z90710 Acquired absence of both cervix and uterus: Secondary | ICD-10-CM

## 2024-03-01 DIAGNOSIS — J189 Pneumonia, unspecified organism: Secondary | ICD-10-CM | POA: Diagnosis not present

## 2024-03-01 DIAGNOSIS — Z8249 Family history of ischemic heart disease and other diseases of the circulatory system: Secondary | ICD-10-CM

## 2024-03-01 DIAGNOSIS — J441 Chronic obstructive pulmonary disease with (acute) exacerbation: Secondary | ICD-10-CM | POA: Diagnosis present

## 2024-03-01 DIAGNOSIS — J90 Pleural effusion, not elsewhere classified: Secondary | ICD-10-CM | POA: Diagnosis not present

## 2024-03-01 DIAGNOSIS — Z79899 Other long term (current) drug therapy: Secondary | ICD-10-CM

## 2024-03-01 DIAGNOSIS — Z794 Long term (current) use of insulin: Secondary | ICD-10-CM

## 2024-03-01 DIAGNOSIS — J9601 Acute respiratory failure with hypoxia: Secondary | ICD-10-CM | POA: Diagnosis not present

## 2024-03-01 DIAGNOSIS — Z8041 Family history of malignant neoplasm of ovary: Secondary | ICD-10-CM

## 2024-03-01 DIAGNOSIS — B3731 Acute candidiasis of vulva and vagina: Secondary | ICD-10-CM | POA: Diagnosis present

## 2024-03-01 DIAGNOSIS — Z791 Long term (current) use of non-steroidal anti-inflammatories (NSAID): Secondary | ICD-10-CM

## 2024-03-01 DIAGNOSIS — Z9049 Acquired absence of other specified parts of digestive tract: Secondary | ICD-10-CM

## 2024-03-01 DIAGNOSIS — I10 Essential (primary) hypertension: Secondary | ICD-10-CM | POA: Diagnosis present

## 2024-03-01 DIAGNOSIS — K219 Gastro-esophageal reflux disease without esophagitis: Secondary | ICD-10-CM | POA: Diagnosis present

## 2024-03-01 DIAGNOSIS — Z7985 Long-term (current) use of injectable non-insulin antidiabetic drugs: Secondary | ICD-10-CM

## 2024-03-01 DIAGNOSIS — Z7984 Long term (current) use of oral hypoglycemic drugs: Secondary | ICD-10-CM

## 2024-03-01 DIAGNOSIS — N3010 Interstitial cystitis (chronic) without hematuria: Secondary | ICD-10-CM | POA: Diagnosis present

## 2024-03-01 DIAGNOSIS — A419 Sepsis, unspecified organism: Principal | ICD-10-CM | POA: Diagnosis present

## 2024-03-01 DIAGNOSIS — R918 Other nonspecific abnormal finding of lung field: Secondary | ICD-10-CM | POA: Diagnosis not present

## 2024-03-01 DIAGNOSIS — J181 Lobar pneumonia, unspecified organism: Secondary | ICD-10-CM | POA: Diagnosis present

## 2024-03-01 DIAGNOSIS — J440 Chronic obstructive pulmonary disease with acute lower respiratory infection: Secondary | ICD-10-CM | POA: Diagnosis present

## 2024-03-01 DIAGNOSIS — M069 Rheumatoid arthritis, unspecified: Secondary | ICD-10-CM | POA: Diagnosis present

## 2024-03-01 DIAGNOSIS — Z96651 Presence of right artificial knee joint: Secondary | ICD-10-CM | POA: Diagnosis present

## 2024-03-01 DIAGNOSIS — F32A Depression, unspecified: Secondary | ICD-10-CM | POA: Diagnosis present

## 2024-03-01 DIAGNOSIS — Z83438 Family history of other disorder of lipoprotein metabolism and other lipidemia: Secondary | ICD-10-CM

## 2024-03-01 DIAGNOSIS — R0602 Shortness of breath: Secondary | ICD-10-CM | POA: Diagnosis not present

## 2024-03-01 DIAGNOSIS — I700 Atherosclerosis of aorta: Secondary | ICD-10-CM | POA: Diagnosis not present

## 2024-03-01 DIAGNOSIS — Z833 Family history of diabetes mellitus: Secondary | ICD-10-CM

## 2024-03-01 DIAGNOSIS — E119 Type 2 diabetes mellitus without complications: Secondary | ICD-10-CM | POA: Diagnosis present

## 2024-03-01 DIAGNOSIS — E785 Hyperlipidemia, unspecified: Secondary | ICD-10-CM | POA: Diagnosis present

## 2024-03-01 DIAGNOSIS — Z8349 Family history of other endocrine, nutritional and metabolic diseases: Secondary | ICD-10-CM

## 2024-03-01 DIAGNOSIS — R0902 Hypoxemia: Secondary | ICD-10-CM

## 2024-03-01 DIAGNOSIS — G4733 Obstructive sleep apnea (adult) (pediatric): Secondary | ICD-10-CM | POA: Diagnosis present

## 2024-03-01 DIAGNOSIS — Z8261 Family history of arthritis: Secondary | ICD-10-CM

## 2024-03-01 DIAGNOSIS — Z825 Family history of asthma and other chronic lower respiratory diseases: Secondary | ICD-10-CM

## 2024-03-01 DIAGNOSIS — Z87891 Personal history of nicotine dependence: Secondary | ICD-10-CM

## 2024-03-01 LAB — RESPIRATORY PANEL BY PCR

## 2024-03-01 LAB — RESP PANEL BY RT-PCR (RSV, FLU A&B, COVID)  RVPGX2
Influenza A by PCR: NEGATIVE
Influenza B by PCR: NEGATIVE
Resp Syncytial Virus by PCR: NEGATIVE
SARS Coronavirus 2 by RT PCR: NEGATIVE

## 2024-03-01 LAB — GLUCOSE, CAPILLARY: Glucose-Capillary: 154 mg/dL — ABNORMAL HIGH (ref 70–99)

## 2024-03-01 LAB — CBC
HCT: 44.3 % (ref 36.0–46.0)
Hemoglobin: 14.1 g/dL (ref 12.0–15.0)
MCH: 28 pg (ref 26.0–34.0)
MCHC: 31.8 g/dL (ref 30.0–36.0)
MCV: 87.9 fL (ref 80.0–100.0)
Platelets: 175 K/uL (ref 150–400)
RBC: 5.04 MIL/uL (ref 3.87–5.11)
RDW: 16.1 % — ABNORMAL HIGH (ref 11.5–15.5)
WBC: 11.1 K/uL — ABNORMAL HIGH (ref 4.0–10.5)
nRBC: 0 % (ref 0.0–0.2)

## 2024-03-01 LAB — BASIC METABOLIC PANEL WITH GFR
Anion gap: 10 (ref 5–15)
BUN: 12 mg/dL (ref 8–23)
CO2: 26 mmol/L (ref 22–32)
Calcium: 10.2 mg/dL (ref 8.9–10.3)
Chloride: 102 mmol/L (ref 98–111)
Creatinine, Ser: 0.8 mg/dL (ref 0.44–1.00)
GFR, Estimated: >60 mL/min (ref >60)
Glucose, Bld: 176 mg/dL — ABNORMAL HIGH (ref 70–99)
Potassium: 4.3 mmol/L (ref 3.5–5.1)
Sodium: 138 mmol/L (ref 135–145)

## 2024-03-01 LAB — CBG MONITORING, ED: Glucose-Capillary: 111 mg/dL — ABNORMAL HIGH (ref 70–99)

## 2024-03-01 LAB — PROCALCITONIN: Procalcitonin: <0.1 ng/mL

## 2024-03-01 LAB — LACTIC ACID, PLASMA
Lactic Acid, Venous: 2.2 mmol/L (ref 0.5–1.9)
Lactic Acid, Venous: 0.9 mmol/L (ref 0.5–1.9)

## 2024-03-01 LAB — HEMOGLOBIN A1C
Hgb A1c MFr Bld: 5.4 % (ref 4.8–5.6)
Mean Plasma Glucose: 108.28 mg/dL

## 2024-03-01 MED ORDER — SODIUM CHLORIDE 0.9 % IV SOLN: INTRAVENOUS | Status: AC

## 2024-03-01 MED ORDER — ENOXAPARIN SODIUM 40 MG/0.4ML IJ SOSY
40.0000 mg | PREFILLED_SYRINGE | INTRAMUSCULAR | Status: DC
  Administered 2024-03-01 – 2024-03-03 (×3): 40 mg via SUBCUTANEOUS
  Filled 2024-03-01 (×3): qty 0.4

## 2024-03-01 MED ORDER — ACETAMINOPHEN 500 MG PO TABS
1000.0000 mg | ORAL_TABLET | Freq: Once | ORAL | Status: AC
  Administered 2024-03-01: 1000 mg via ORAL

## 2024-03-01 MED ORDER — IPRATROPIUM-ALBUTEROL 0.5-2.5 (3) MG/3ML IN SOLN
3.0000 mL | Freq: Four times a day (QID) | RESPIRATORY_TRACT | Status: DC
  Administered 2024-03-01 – 2024-03-02 (×2): 3 mL via RESPIRATORY_TRACT
  Filled 2024-03-01 (×2): qty 3

## 2024-03-01 MED ORDER — FLUCONAZOLE 150 MG PO TABS
150.0000 mg | ORAL_TABLET | Freq: Once | ORAL | Status: AC
  Administered 2024-03-01: 150 mg via ORAL
  Filled 2024-03-01: qty 1

## 2024-03-01 MED ORDER — SODIUM CHLORIDE 0.9 % IV SOLN
500.0000 mg | INTRAVENOUS | Status: DC
  Filled 2024-03-01: qty 5

## 2024-03-01 MED ORDER — INSULIN ASPART 100 UNIT/ML IJ SOLN: 0.0000 [IU] | Freq: Three times a day (TID) | INTRAMUSCULAR | Status: DC

## 2024-03-01 MED ORDER — SODIUM CHLORIDE 0.9 % IV SOLN
1.0000 g | Freq: Once | INTRAVENOUS | Status: AC
  Administered 2024-03-01: 1 g via INTRAVENOUS
  Filled 2024-03-01: qty 10

## 2024-03-01 MED ORDER — BENZONATATE 100 MG PO CAPS
200.0000 mg | ORAL_CAPSULE | Freq: Once | ORAL | Status: AC
  Administered 2024-03-01: 200 mg via ORAL
  Filled 2024-03-01: qty 2

## 2024-03-01 MED ORDER — ACETAMINOPHEN 325 MG PO TABS
650.0000 mg | ORAL_TABLET | Freq: Four times a day (QID) | ORAL | Status: DC | PRN
  Administered 2024-03-02 – 2024-03-03 (×2): 650 mg via ORAL
  Filled 2024-03-01 (×2): qty 2

## 2024-03-01 MED ORDER — GUAIFENESIN ER 600 MG PO TB12
600.0000 mg | ORAL_TABLET | Freq: Two times a day (BID) | ORAL | Status: DC
  Administered 2024-03-01 – 2024-03-04 (×6): 600 mg via ORAL
  Filled 2024-03-01 (×6): qty 1

## 2024-03-01 MED ORDER — ONDANSETRON HCL 4 MG/2ML IJ SOLN: 4.0000 mg | Freq: Four times a day (QID) | INTRAMUSCULAR | Status: DC | PRN

## 2024-03-01 MED ORDER — PREDNISONE 20 MG PO TABS
40.0000 mg | ORAL_TABLET | Freq: Every day | ORAL | Status: DC
  Administered 2024-03-01 – 2024-03-03 (×3): 40 mg via ORAL
  Filled 2024-03-01 (×3): qty 2

## 2024-03-01 MED ORDER — SODIUM CHLORIDE 0.9 % IV SOLN
1.0000 g | INTRAVENOUS | Status: DC
  Administered 2024-03-02: 1 g via INTRAVENOUS
  Filled 2024-03-01: qty 10

## 2024-03-01 MED ORDER — ONDANSETRON HCL 4 MG PO TABS: 4.0000 mg | ORAL_TABLET | Freq: Four times a day (QID) | ORAL | Status: DC | PRN

## 2024-03-01 MED ORDER — IPRATROPIUM-ALBUTEROL 0.5-2.5 (3) MG/3ML IN SOLN
3.0000 mL | Freq: Once | RESPIRATORY_TRACT | Status: AC
  Administered 2024-03-01: 3 mL via RESPIRATORY_TRACT
  Filled 2024-03-01: qty 3

## 2024-03-01 MED ORDER — ALBUTEROL SULFATE (2.5 MG/3ML) 0.083% IN NEBU
2.5000 mg | INHALATION_SOLUTION | RESPIRATORY_TRACT | Status: DC | PRN
Start: 2024-03-01 — End: 2024-03-04
  Administered 2024-03-02: 2.5 mg via RESPIRATORY_TRACT

## 2024-03-01 MED ORDER — SODIUM CHLORIDE 0.9 % IV BOLUS
500.0000 mL | Freq: Once | INTRAVENOUS | Status: AC
  Administered 2024-03-01: 500 mL via INTRAVENOUS

## 2024-03-01 MED ORDER — ACETAMINOPHEN 325 MG PO TABS: 650.0000 mg | ORAL_TABLET | Freq: Once | ORAL | Status: DC

## 2024-03-01 MED ORDER — SODIUM CHLORIDE 0.9 % IV SOLN
500.0000 mg | Freq: Once | INTRAVENOUS | Status: AC
  Administered 2024-03-01: 500 mg via INTRAVENOUS
  Filled 2024-03-01: qty 5

## 2024-03-01 MED ORDER — ACETAMINOPHEN 650 MG RE SUPP: 650.0000 mg | Freq: Four times a day (QID) | RECTAL | Status: DC | PRN

## 2024-03-01 NOTE — Assessment & Plan Note (Signed)
-   Continue home meds

## 2024-03-01 NOTE — H&P (Signed)
 History and Physical    Patient: [name] FMW:[id number] DOB: 09/25/1950 DOA: 03/01/2024 DOS: the patient was seen and examined on 03/01/2024 PCP: Sheryle Carwin, MD  Patient coming from: [facility] - lives alone. Uses a cane.    Chief Complaint: shortness of breath and cough   HPI: [name] is a 73 y.o. female with medical history significant of T2DM, OSA, COPD, RA, HLD, GERD, HTN who presented to ED with complaints of cough and shortness of breath x 1 week. She states she has had fever since Wednesday and symptoms have gotten worse. Late last night she started to get more short of breath and had a temperature to 102.9. Her cough is productive with yellow phlegm. She has increased wheezing as well.  She talked to her daughter who came to her house and took her to ED. She has not been around any sick contacts. She has been taking mucinex  and some tylenol . She states she has some dysuria and thinks she has a vaginal yeast infection. She has bad itchy and white cheese like discharge.   She called her pulmonologist on the 11/19 and augmentin  was sent to pharmacy but she never picked this up.   Denies any vision changes/headaches, chest pain or palpitations, abdominal pain, N/V/D, dysuria or leg swelling.   She does not smoke or drink alcohol .   ER Course:  vitals: tmax of 102.3, bp: 128/65, HR; 92, RR; 24, oxygen: 85%RA>95%2L  Pertinent labs: wbc: 11.1, covid/flu/rsv: negative,  CXR: Patchy opacities in the right lower lung with adjacent small right pleural effusion, possibly representing infection or aspiration. 2. Aortic Atherosclerosis (ICD10-I70.0). In ED: given azithromycin  and rocephin , duoneb and tylenol . Resp PCR panel ordered. [facility] asked to admit    Review of Systems: As mentioned in the history of present illness. All other systems reviewed and are negative. Past Medical History:  Diagnosis Date   Asthma    COPD (chronic obstructive pulmonary disease) (HCC)    Depression    DM  type 1 (diabetes mellitus, type 1) (HCC)    GERD (gastroesophageal reflux disease)    Hyperlipidemia    Hypertension    Morbid obesity (HCC)    Obstructive sleep apnea syndrome    Osteoarthritis of right knee 04/2010   end-stage   PONV (postoperative nausea and vomiting)    Rheumatoid arthritis(714.0)    on methotrexate therapy   Septic arthritis of knee, right (HCC) 04/2010   Past Surgical History:  Procedure Laterality Date   ABDOMINAL HYSTERECTOMY  04/11/1990   ABDOMINAL WALL MESH  REMOVAL  08/13/2009   with removal of retained stitches   AORTO-PULMONARY WINDOW REPAIR  04/11/1990   for cardiac tamponade   APPENDECTOMY  04/11/1965   BIOPSY  10/29/2018   Procedure: BIOPSY;  Surgeon: Legrand Victory LITTIE DOUGLAS, MD;  Location: [facility];  Service: Gastroenterology;;   BONE BIOPSY Right 04/19/2016   Procedure: BONE BIOPSY RIGHT GREAT TOE;  Surgeon: Morene Anon, DPM;  Location: [facility];  Service: Podiatry;  Laterality: Right;   BREAST BIOPSY Right    multiple Bx - all benign   CHOLECYSTECTOMY  04/11/1990   laparoscopic   COLONOSCOPY  12/08/2011   Procedure: COLONOSCOPY;  Surgeon: Claudis RAYMOND Rivet, MD;  Location: [facility];  Service: Endoscopy;  Laterality: N/A;  830   COLONOSCOPY WITH PROPOFOL  N/A 10/16/2017   Procedure: COLONOSCOPY WITH PROPOFOL ;  Surgeon: Legrand Victory LITTIE DOUGLAS, MD;  Location: [facility];  Service: Gastroenterology;  Laterality: N/A;   COLONOSCOPY  WITH PROPOFOL  N/A 10/29/2018   Procedure: COLONOSCOPY WITH PROPOFOL ;  Surgeon: Legrand Victory LITTIE DOUGLAS, MD;  Location: [facility];  Service: Gastroenterology;  Laterality: N/A;   ESOPHAGOGASTRODUODENOSCOPY (EGD) WITH PROPOFOL  N/A 10/29/2018   Procedure: ESOPHAGOGASTRODUODENOSCOPY (EGD) WITH PROPOFOL ;  Surgeon: Legrand Victory LITTIE DOUGLAS, MD;  Location: [facility];  Service: Gastroenterology;  Laterality: N/A;   FOREIGN BODY REMOVAL ABDOMINAL  01/04/2010   stitch abscesses   HERNIA REPAIR  04/12/1999   open LOA / VWH repair w  mesh.  Dr. Mavis   HERNIA REPAIR  04/11/2005   open redo LOA / VWH repair w mesh.  Dr. Mavis   POLYPECTOMY  10/16/2017   Procedure: POLYPECTOMY;  Surgeon: Legrand Victory LITTIE DOUGLAS, MD;  Location: [facility];  Service: Gastroenterology;;   POLYPECTOMY  10/29/2018   Procedure: POLYPECTOMY;  Surgeon: Legrand Victory LITTIE DOUGLAS, MD;  Location: [facility];  Service: Gastroenterology;;   REVISION TOTAL KNEE ARTHROPLASTY  06/09/2010   I and D    TOTAL KNEE ARTHROPLASTY  05/07/2010   right knee   Social History:  reports that she quit smoking about 31 years ago. Her smoking use included cigarettes. She started smoking about 56 years ago. She has a 50 pack-year smoking history. She has never used smokeless tobacco. She reports that she does not drink alcohol  and does not use drugs.  Allergies  Allergen Reactions   Demerol  Nausea And Vomiting and Other (See Comments)    Severe symptoms   Hydromorphone Hcl Itching and Other (See Comments)    All over the body itching   Tape Other (See Comments)    SKIN IS THIN!! PLEASE USE COBAN!!!   Tetracycline Hives    Family History  Adopted: Yes  Problem Relation Age of Onset   Ovarian cancer Maternal Grandmother 59 - 48   Diabetes Father    COPD Father    Arthritis Father        RA   Cancer Mother    Hyperlipidemia Mother    Diabetes Mother    Thyroid  disease Mother    Hyperlipidemia Brother    Hypertension Brother    Hypertension Son    Arthritis Son    Breast cancer Neg Hx    BRCA 1/2 Neg Hx     Prior to Admission medications   Medication Sig Start Date End Date Taking? Authorizing Provider  furosemide (LASIX) 40 MG tablet Take 40 mg by mouth daily. 01/16/24  Yes [provider]  albuterol  (PROVENTIL ) (2.5 MG/3ML) 0.083% nebulizer solution Take 3 mLs (2.5 mg total) by nebulization every 6 (six) hours as needed for wheezing or shortness of breath (dx: J43.9). 02/25/22   Shellia Oh, MD  albuterol  (VENTOLIN  HFA) 108 (90 Base) MCG/ACT  inhaler USE 2 INHALATIONS EVERY 6 HOURS AS NEEDED FOR WHEEZING OR SHORTNESS OF BREATH 08/10/23   Hope Almarie ORN, NP  ALPRAZolam  (XANAX ) 0.5 MG tablet Take 0.5 mg by mouth 3 (three) times daily as needed for sleep or anxiety.     [provider]  amoxicillin -clavulanate (AUGMENTIN ) 875-125 MG tablet Take 1 tablet by mouth 2 (two) times daily. 02/29/24   Hope Almarie ORN, NP  BENICAR  20 MG tablet Take 0.5 tablets (10 mg total) by mouth daily. 09/19/22 02/26/24  Rosario Eland I, MD  budesonide -glycopyrrolate -formoterol  (BREZTRI  AEROSPHERE) 160-9-4.8 MCG/ACT AERO inhaler USE 2 INHALATIONS IN THE MORNING AND AT BEDTIME 01/15/24   Wert, Michael B, MD  carboxymethylcellulose (LUBRICANT EYE DROPS) 0.5 % SOLN Place 1 drop into both eyes  3 (three) times daily.    [provider]  diclofenac  sodium (VOLTAREN ) 1 % GEL Apply 2-4 g topically 4 (four) times daily as needed for pain (Hand pain). 08/13/18   [provider]  empagliflozin (JARDIANCE) 25 MG TABS tablet Take 25 mg by mouth daily.    [provider]  fluconazole  (DIFLUCAN ) 100 MG tablet 1 daily for 7 then every 3 days til finsihed 02/26/24   Jayne Vonn DEL, MD  fluticasone  (FLONASE ) 50 MCG/ACT nasal spray Place 1 spray into both nostrils daily. 02/27/23   Darlean Ozell NOVAK, MD  folic acid  (FOLVITE ) 1 MG tablet Take 1 mg by mouth daily. 09/01/21   [provider]  gabapentin  (NEURONTIN ) 600 MG tablet Take 600 mg by mouth daily.    [provider]  HUMALOG KWIKPEN 100 UNIT/ML KwikPen Inject 8-14 Units into the skin See admin instructions. Inject 8 units subcutaneously in the morning & inject 14 units subcutaneously at supper 08/13/18   [provider]  hydrocortisone 2.5%-nystatin-zinc  oxide 20% 1:1:1 ointment mixture Apply topically 3 (three) times daily as needed. 02/26/24   Jayne Vonn DEL, MD  InFLIXimab  (REMICADE  IV) Inject 1 Dose into the vein every 6 (six) weeks.     [provider]   LANTUS  SOLOSTAR 100 UNIT/ML Solostar Pen Inject 10 Units into the skin daily. Inject 10 units into  the skin daily 09/19/22   Rosario Leatrice FERNS, MD  Levocetirizine Dihydrochloride (XYZAL PO) Take by mouth.    [provider]  meloxicam (MOBIC) 15 MG tablet Take 15 mg by mouth daily. 11/24/22   [provider]  metFORMIN  (GLUCOPHAGE -XR) 500 MG 24 hr tablet Take 2,000 mg by mouth daily.    [provider]  methotrexate (RHEUMATREX) 2.5 MG tablet Take 2.5 mg by mouth once a week. Caution:Chemotherapy. Protect from light.    [provider]  montelukast  (SINGULAIR ) 10 MG tablet TAKE 1 TABLET AT BEDTIME 02/06/24   Darlean Ozell NOVAK, MD  omeprazole  (PRILOSEC) 40 MG capsule TAKE 1 CAPSULE EVERY MORNING 01/10/21   Sood, Vineet, MD  pentosan polysulfate (ELMIRON ) 100 MG capsule Take 2 tablets twice daily 07/06/23   Jayne Vonn DEL, MD  sertraline  (ZOLOFT ) 100 MG tablet Take 100 mg by mouth daily.    [provider]  simvastatin  (ZOCOR ) 40 MG tablet Take 40 mg by mouth daily with supper.    [provider]  TRULICITY 0.75 MG/0.5ML SOPN Inject 0.75 mg into the skin once a week. 01/22/20   [provider]  Urelle  (URELLE /URISED) 81 MG TABS tablet Take 1 tablet (81 mg total) by mouth 4 (four) times daily. 06/19/23   Jayne Vonn DEL, MD    Physical Exam: Vitals:   03/01/24 1500 03/01/24 1704 03/01/24 1734 03/01/24 2127  BP: 125/65  (!) 130/57 (!) 119/55  Pulse: 90  95 79  Resp: 17  20 19   Temp:  (!) 102.3 F (39.1 C) 99.8 F (37.7 C) 98.2 F (36.8 C)  TempSrc:  Oral  Oral  SpO2: 96%  95% 96%  Weight:      Height:       General:  Appears calm and comfortable and is in NAD. Obese  Eyes:  PERRL, EOMI, normal lids, iris ENT:  grossly normal hearing, lips & tongue, mmm; appropriate dentition Neck:  no LAD, masses or thyromegaly; no carotid bruits Cardiovascular:  RRR, +systolic murmur.  No LE edema.  Respiratory:   expiratory wheezes, no  crackles, good air movement  Abdomen:  soft, NT, ND, NABS Back:   normal alignment, no CVAT Skin:  no rash or induration seen on limited exam Musculoskeletal:  grossly normal tone BUE/BLE, good ROM, no bony abnormality Lower extremity:  No LE edema.  Limited foot exam with no ulcerations.  2+ distal pulses. Psychiatric:  grossly normal mood and affect, speech fluent and appropriate, AOx3 Neurologic:  CN 2-12 grossly intact, moves all extremities in coordinated fashion, sensation intact   Radiological Exams on Admission: Independently reviewed - see discussion in A/P where applicable  DG Chest 2 View Result Date: 03/01/2024 EXAM: 2 VIEW(S) XRAY OF THE CHEST 03/01/2024 11:29:19 AM COMPARISON: 07/24/2023 CLINICAL HISTORY: SOB FINDINGS: LUNGS AND PLEURA: Patchy opacities in the right lower lung with adjacent small right pleural effusion. No pneumothorax. HEART AND MEDIASTINUM: Calcified aorta. No acute abnormality of the cardiac and mediastinal silhouettes. BONES AND SOFT TISSUES: Thoracic degenerative changes. No acute osseous abnormality. IMPRESSION: 1. Patchy opacities in the right lower lung with adjacent small right pleural effusion, possibly representing infection or aspiration. 2. Aortic Atherosclerosis (ICD10-I70.0). Electronically signed by: Ryan Chess MD 03/01/2024 11:53 AM EST RP Workstation: [id number]    EKG: Independently reviewed.  NSR with rate 91; nonspecific ST changes with no evidence of acute ischemia   Labs on Admission: I have personally reviewed the available labs and imaging studies at the time of the admission.  Pertinent labs:   wbc: 11.1 covid/flu/rsv: negative,  Assessment and Plan: Principal Problem:   Acute respiratory failure with hypoxia (HCC) Active Problems:   Sepsis due to pneumonia (HCC)   COPD with acute exacerbation (HCC)   recurrent Vaginal candida   HTN (hypertension)   Insulin -requiring or dependent type II diabetes mellitus (HCC)    Hyperlipidemia   Interstitial cystitis   Rheumatoid arthritis (HCC)   Depression   GERD (gastroesophageal reflux disease)   Obstructive sleep apnea    Assessment and Plan: * Acute respiratory failure with hypoxia (HCC) 73 year old female presenting to ED with complaints of 3 day history of fever, productive cough and worsening shortness of breath found to be hypoxic on room air to 85% requiring 2-3L oxygen to maintain saturation in setting of CAP and COPD exacerbation  -place in obs  -continue abx and treatement of CAP, see #2 -continue steroids, nebs, and treatment of COPD exac, see #3 -wean as tolerated   Sepsis due to pneumonia (HCC) Sepsis criteria met with temp of 102.3 and HR of 99 in setting of pneumonia  -Likely contributing to #1 -check BC -check lactic acid -continue rocephin  and azithromycin  -check urinary antigens  -trend PCT, off methotrexate  -RVP negative  -sputum culture  -mucinex  BID -scheduled duonebs and SABA prn   COPD with acute exacerbation (HCC) COPD not on oxygen at baseline, likely contributing to acute respiratory failure with hypoxia  Has expiratory wheezes with increased sputum production/viscosity in setting of pneumonia RVP negative Continue abx Sputum cx  Start steroids  Scheduled duonebs and SABA prn RT consult  Mucinex  and flutter valve  Continue home breztri      recurrent Vaginal candida Consider d/c jardiance Hold for now Diflucan  x 1   HTN (hypertension) Soft readings, hold her benicar  for now   Insulin -requiring or dependent type II diabetes mellitus (HCC) A1C of 6.1 in 2024 Holding jardiance with recurrent yeast infections  States 50 units of lantus  BID Not eating and BS normal.  will hold long acting tonight  SSI and accuchecks QAC/HS Hold metformin  and Trulicity   Hyperlipidemia  Continue zocor  daily   Interstitial cystitis Continue home meds   Rheumatoid arthritis (HCC) Has been holding methotrexate Continue  folic acid    Depression Continue zoloft  daily Has not been on xanax  in awhile   GERD (gastroesophageal reflux disease) Continue PPI   Obstructive sleep apnea Continue cpap at night        Advance Care Planning:   Code Status: Full Code    Consultants: RT   Procedures: None    Antibiotics: Rocephin  and azithromycin  started 11/21     DVT Prophylaxis: lovenox    Family Communication: none   Severity of Illness: The appropriate patient status for this patient is OBSERVATION. Observation status is judged to be reasonable and necessary in order to provide the required intensity of service to ensure the patient's safety. The patient's presenting symptoms, physical exam findings, and initial radiographic and laboratory data in the context of their medical condition is felt to place them at decreased risk for further clinical deterioration. Furthermore, it is anticipated that the patient will be medically stable for discharge from the hospital within 2 midnights of admission.   Author: Isaiah Geralds, MD 03/01/2024 9:35 PM  For on call review [email or web address].

## 2024-03-01 NOTE — Assessment & Plan Note (Signed)
 Has been holding methotrexate Continue folic acid 

## 2024-03-01 NOTE — ED Notes (Signed)
 Taryn with [facility] called for transport

## 2024-03-01 NOTE — ED Notes (Signed)
 RT at bedside.

## 2024-03-01 NOTE — ED Provider Notes (Signed)
 [place] [facility]ERGENCY DEPARTMENT AT [facility] Provider Note   CSN: [id number] Arrival date & time: 03/01/24  1104     Patient presents with: No chief complaint on file.   [name] is a 73 y.o. female.  Patient with past history significant for type 2 diabetes, OSA, COPD, rheumatoid arthritis, and hyperlipidemia presents the emergency department with concerns of cough and shortness of breath.  Reports that she has been having issues for the last week or so with ongoing productive cough, fever, chills, body aches, and some shortness of breath.  Reports that her shortness of breath worsened last night.  No reported chest pain.  Reportedly has felt intermittent improvement on occasion to then be followed by worsening symptoms the next day.  She is primarily concerned for pneumonia at this time.  She reports that she has been compliant with her home medication for COPD and does not currently require maintenance oxygen at home.  HPI     Prior to Admission medications   Medication Sig Start Date End Date Taking? Authorizing Provider  furosemide (LASIX) 40 MG tablet Take 40 mg by mouth daily. 01/16/24  Yes [provider]  albuterol  (PROVENTIL ) (2.5 MG/3ML) 0.083% nebulizer solution Take 3 mLs (2.5 mg total) by nebulization every 6 (six) hours as needed for wheezing or shortness of breath (dx: J43.9). 02/25/22   Shellia Oh, MD  albuterol  (VENTOLIN  HFA) 108 (90 Base) MCG/ACT inhaler USE 2 INHALATIONS EVERY 6 HOURS AS NEEDED FOR WHEEZING OR SHORTNESS OF BREATH 08/10/23   Hope Almarie ORN, NP  ALPRAZolam  (XANAX ) 0.5 MG tablet Take 0.5 mg by mouth 3 (three) times daily as needed for sleep or anxiety.     [provider]  amoxicillin -clavulanate (AUGMENTIN ) 875-125 MG tablet Take 1 tablet by mouth 2 (two) times daily. 02/29/24   Hope Almarie ORN, NP  BENICAR  20 MG tablet Take 0.5 tablets (10 mg total) by mouth daily. 09/19/22 02/26/24  Rosario Leatrice FERNS, MD   budesonide -glycopyrrolate -formoterol  (BREZTRI  AEROSPHERE) 160-9-4.8 MCG/ACT AERO inhaler USE 2 INHALATIONS IN THE MORNING AND AT BEDTIME 01/15/24   Wert, Michael B, MD  carboxymethylcellulose (LUBRICANT EYE DROPS) 0.5 % SOLN Place 1 drop into both eyes 3 (three) times daily.    [provider]  diclofenac  sodium (VOLTAREN ) 1 % GEL Apply 2-4 g topically 4 (four) times daily as needed for pain (Hand pain). 08/13/18   [provider]  empagliflozin (JARDIANCE) 25 MG TABS tablet Take 25 mg by mouth daily.    [provider]  fluconazole  (DIFLUCAN ) 100 MG tablet 1 daily for 7 then every 3 days til finsihed 02/26/24   Jayne Vonn DEL, MD  fluticasone  (FLONASE ) 50 MCG/ACT nasal spray Place 1 spray into both nostrils daily. 02/27/23   Darlean Ozell NOVAK, MD  folic acid  (FOLVITE ) 1 MG tablet Take 1 mg by mouth daily. 09/01/21   [provider]  gabapentin  (NEURONTIN ) 600 MG tablet Take 600 mg by mouth daily.    [provider]  HUMALOG KWIKPEN 100 UNIT/ML KwikPen Inject 8-14 Units into the skin See admin instructions. Inject 8 units subcutaneously in the morning & inject 14 units subcutaneously at supper 08/13/18   [provider]  hydrocortisone 2.5%-nystatin-zinc  oxide 20% 1:1:1 ointment mixture Apply topically 3 (three) times daily as needed. 02/26/24   Jayne Vonn DEL, MD  InFLIXimab  (REMICADE  IV) Inject 1 Dose into the vein every 6 (six) weeks.     [provider]  LANTUS  SOLOSTAR 100 UNIT/ML Solostar  Pen Inject 10 Units into the skin daily. Inject 10 units into  the skin daily 09/19/22   Rosario Leatrice FERNS, MD  Levocetirizine Dihydrochloride (XYZAL PO) Take by mouth.    [provider]  meloxicam (MOBIC) 15 MG tablet Take 15 mg by mouth daily. 11/24/22   [provider]  metFORMIN  (GLUCOPHAGE -XR) 500 MG 24 hr tablet Take 2,000 mg by mouth daily.    [provider]  methotrexate (RHEUMATREX) 2.5 MG tablet Take 2.5 mg by mouth  once a week. Caution:Chemotherapy. Protect from light.    [provider]  montelukast  (SINGULAIR ) 10 MG tablet TAKE 1 TABLET AT BEDTIME 02/06/24   Darlean Ozell NOVAK, MD  omeprazole  (PRILOSEC) 40 MG capsule TAKE 1 CAPSULE EVERY MORNING 01/10/21   Sood, Vineet, MD  pentosan polysulfate (ELMIRON ) 100 MG capsule Take 2 tablets twice daily 07/06/23   Jayne Vonn DEL, MD  sertraline  (ZOLOFT ) 100 MG tablet Take 100 mg by mouth daily.    [provider]  simvastatin  (ZOCOR ) 40 MG tablet Take 40 mg by mouth daily with supper.    [provider]  TRULICITY 0.75 MG/0.5ML SOPN Inject 0.75 mg into the skin once a week. 01/22/20   [provider]  Urelle  (URELLE /URISED) 81 MG TABS tablet Take 1 tablet (81 mg total) by mouth 4 (four) times daily. 06/19/23   Jayne Vonn DEL, MD    Allergies: Demerol , Hydromorphone hcl, and Tetracycline    Review of Systems  Respiratory:  Positive for cough and shortness of breath.   All other systems reviewed and are negative.   Updated Vital Signs BP 125/65   Pulse 90   Temp 98.5 F (36.9 C) (Oral)   Resp 17   Ht 5' 2 (1.575 m)   Wt 112.5 kg   SpO2 96%   BMI 45.36 kg/m   Physical Exam Vitals and nursing note reviewed.  Constitutional:      General: She is not in acute distress.    Appearance: She is well-developed.  HENT:     Head: Normocephalic and atraumatic.  Eyes:     Conjunctiva/sclera: Conjunctivae normal.  Cardiovascular:     Rate and Rhythm: Normal rate and regular rhythm.     Heart sounds: No murmur heard. Pulmonary:     Effort: Pulmonary effort is normal. No respiratory distress.     Breath sounds: Examination of the right-lower field reveals rales. Rales present.    Abdominal:     Palpations: Abdomen is soft.     Tenderness: There is no abdominal tenderness.  Musculoskeletal:        General: No swelling.     Cervical back: Neck supple.  Skin:    General: Skin is warm and dry.     Capillary Refill:  Capillary refill takes less than 2 seconds.  Neurological:     Mental Status: She is alert.  Psychiatric:        Mood and Affect: Mood normal.     (all labs ordered are listed, but only abnormal results are displayed) Labs Reviewed  BASIC METABOLIC PANEL WITH GFR - Abnormal; Notable for the following components:      Result Value   Glucose, Bld 176 (*)    All other components within normal limits  CBC - Abnormal; Notable for the following components:   WBC 11.1 (*)    RDW 16.1 (*)    All other components within normal limits  CBG MONITORING, ED - Abnormal; Notable for the following components:  Glucose-Capillary 111 (*)    All other components within normal limits  RESP PANEL BY RT-PCR (RSV, FLU A&B, COVID)  [id number]  RESPIRATORY PANEL BY PCR    EKG: EKG Interpretation Date/Time:  Friday March 01 2024 11:14:38 EST Ventricular Rate:  91 PR Interval:  132 QRS Duration:  78 QT Interval:  338 QTC Calculation: 415 R Axis:   58  Text Interpretation: Normal sinus rhythm Nonspecific ST abnormality Abnormal ECG No significant change since last tracing Confirmed by Emil Share [phone]) on 03/01/2024 1:33:14 PM  Radiology: [facility] Chest 2 View Result Date: 03/01/2024 EXAM: 2 VIEW(S) XRAY OF THE CHEST 03/01/2024 11:29:19 AM COMPARISON: 07/24/2023 CLINICAL HISTORY: SOB FINDINGS: LUNGS AND PLEURA: Patchy opacities in the right lower lung with adjacent small right pleural effusion. No pneumothorax. HEART AND MEDIASTINUM: Calcified aorta. No acute abnormality of the cardiac and mediastinal silhouettes. BONES AND SOFT TISSUES: Thoracic degenerative changes. No acute osseous abnormality. IMPRESSION: 1. Patchy opacities in the right lower lung with adjacent small right pleural effusion, possibly representing infection or aspiration. 2. Aortic Atherosclerosis (ICD10-I70.0). Electronically signed by: Ryan Chess MD 03/01/2024 11:53 AM EST RP Workstation: [id number]     Procedures   Medications  Ordered in the ED  azithromycin  (ZITHROMAX ) 500 mg in sodium chloride  0.9 % 250 mL IVPB (500 mg Intravenous New Bag/Given 03/01/24 1453)  cefTRIAXone  (ROCEPHIN ) 1 g in sodium chloride  0.9 % 100 mL IVPB (0 g Intravenous Stopped 03/01/24 1453)  ipratropium-albuterol  (DUONEB) 0.5-2.5 (3) MG/3ML nebulizer solution 3 mL (3 mLs Nebulization Given 03/01/24 1422)                                    Medical Decision Making Amount and/or Complexity of Data Reviewed Labs: ordered. Radiology: ordered.  Risk Prescription drug management. Decision regarding hospitalization.   This patient presents to the ED for concern of cough, shortness of breath, this involves an extensive number of treatment options, and is a complaint that carries with it a high risk of complications and morbidity.  The differential diagnosis includes COVID-19, flu Enza, pneumonia, bronchitis   Co morbidities that complicate the patient evaluation  OSA, COPD, rheumatoid arthritis   Additional history obtained:  Additional history obtained from chart review   Lab Tests:  I Ordered, and personally interpreted labs.  The pertinent results include: CBC with leukocytosis at 11.1, BMP unremarkable, respiratory panel negative   Imaging Studies ordered:  I ordered imaging studies including chest x-ray I independently visualized and interpreted imaging which showed patchy opacities in the right lower lung with adjacent small right pleural effusion, possibly representing infection or aspiration. I agree with the radiologist interpretation   Cardiac Monitoring: / EKG:  The patient was maintained on a cardiac monitor.  I personally viewed and interpreted the cardiac monitored which showed an underlying rhythm of: Normal sinus rhythm   Consultations Obtained:  I requested consultation with the hospitalist,  and discussed lab and imaging findings as well as pertinent plan - they recommend: Spoke with Dr. Caleen, hospitalist,  who will be admitting patient.   Problem List / ED Course / Critical interventions / Medication management  Patient with past history significant for OSA, COPD, rheumatoid arthritis presents to the emergency department with concerns of cough and shortness of breath.  Reports that she been feeling unwell for about the last week with productive cough, fever and chills as well as shortness of breath that started last  night and worsened throughout the night.  Last took Tylenol  this morning.  States that her cough remains productive. On exam, patient has slight wheezing and rales heard to the right lower lung field.  No appreciable chest wall tenderness, crepitus, or abnormal heart sounds. Patient's workup is consistent with pneumonia based on checks x-ray view showing patchy opacification of the right lower lobe.  Patient has mild leukocytosis at 11.1 and combination with reported fevers, productive cough, and initial tachycardia with episodes of hypoxia, I am concerned this is truly an episode of pneumonia.  She does not appear to be septic.  With her hypoxic episodes, will plan on admission to hospitalist. Spoke with Dr. Caleen, hospitalist, who will be admitting patient.  He requested that I add on the 20 pathogen respiratory panel which has now been added. I ordered medication including Rocephin , Zithromax , DuoNeb for pneumonia, COPD Reevaluation of the patient after these medicines showed that the patient improved I have reviewed the patients home medicines and have made adjustments as needed   Social Determinants of Health:  None   Test / Admission - Considered:  Patient requiring admission given worsening hypoxia in the setting of community-acquired pneumonia.  Final diagnoses:  Community acquired pneumonia of right lower lobe of lung  Hypoxia    ED Discharge Orders     None          Cecily Legrand LABOR, PA-C 03/01/24 1707    Emil Share, DO 03/03/24 2303

## 2024-03-01 NOTE — Assessment & Plan Note (Signed)
 Continue cpap at night

## 2024-03-01 NOTE — Assessment & Plan Note (Signed)
 Consider d/c jardiance Hold for now Diflucan  x 1

## 2024-03-01 NOTE — Assessment & Plan Note (Signed)
Continue zocor daily  °

## 2024-03-01 NOTE — ED Triage Notes (Signed)
 Pt caox4 ambulatory c/o productive cough, fever/chills, bodyaches over the past week, SOB started last night and worsened throughout the night. Last took tylenol  approx 0730.

## 2024-03-01 NOTE — Assessment & Plan Note (Addendum)
 Sepsis criteria met with temp of 102.3 and HR of 99 in setting of pneumonia  -Likely contributing to #1 -check BC -check lactic acid -continue rocephin  and azithromycin  -check urinary antigens  -trend PCT, off methotrexate  -RVP negative  -sputum culture  -mucinex  BID -scheduled duonebs and SABA prn

## 2024-03-01 NOTE — Assessment & Plan Note (Signed)
 Continue zoloft  daily Has not been on xanax  in awhile

## 2024-03-01 NOTE — Progress Notes (Signed)
   03/01/24 2345  BiPAP/CPAP/SIPAP  $ Non-Invasive Home Ventilator  Initial  BiPAP/CPAP/SIPAP Pt Type Adult  BiPAP/CPAP/SIPAP Resmed  Mask Type Nasal mask  Dentures removed? Not applicable  Mask Size Small  Flow Rate 2 lpm  Patient Home Machine No  Patient Home Mask No  Patient Home Tubing No  Auto Titrate Yes  Minimum cmH2O 8 cmH2O  Maximum cmH2O 20 cmH2O  CPAP/SIPAP surface wiped down Yes  Device Plugged into RED Power Outlet Yes  BiPAP/CPAP /SiPAP Vitals  Bilateral Breath Sounds Rhonchi;Diminished

## 2024-03-01 NOTE — Assessment & Plan Note (Signed)
 Soft readings, hold her benicar  for now

## 2024-03-01 NOTE — Assessment & Plan Note (Addendum)
 A1C of 6.1 in 2024 Holding jardiance with recurrent yeast infections  States 50 units of lantus  BID Not eating and BS normal.  will hold long acting tonight  SSI and accuchecks QAC/HS Hold metformin  and Trulicity

## 2024-03-01 NOTE — Assessment & Plan Note (Signed)
 73 year old female presenting to ED with complaints of 3 day history of fever, productive cough and worsening shortness of breath found to be hypoxic on room air to 85% requiring 2-3L oxygen to maintain saturation in setting of CAP and COPD exacerbation  -place in obs  -continue abx and treatement of CAP, see #2 -continue steroids, nebs, and treatment of COPD exac, see #3 -wean as tolerated

## 2024-03-01 NOTE — Assessment & Plan Note (Signed)
 COPD not on oxygen at baseline, likely contributing to acute respiratory failure with hypoxia  Has expiratory wheezes with increased sputum production/viscosity in setting of pneumonia RVP negative Continue abx Sputum cx  Start steroids  Scheduled duonebs and SABA prn RT consult  Mucinex  and flutter valve  Continue home breztri 

## 2024-03-01 NOTE — Assessment & Plan Note (Signed)
 Continue PPI.

## 2024-03-02 DIAGNOSIS — G4733 Obstructive sleep apnea (adult) (pediatric): Secondary | ICD-10-CM | POA: Diagnosis present

## 2024-03-02 DIAGNOSIS — Z7984 Long term (current) use of oral hypoglycemic drugs: Secondary | ICD-10-CM | POA: Diagnosis not present

## 2024-03-02 DIAGNOSIS — J440 Chronic obstructive pulmonary disease with acute lower respiratory infection: Secondary | ICD-10-CM | POA: Diagnosis present

## 2024-03-02 DIAGNOSIS — R6520 Severe sepsis without septic shock: Secondary | ICD-10-CM | POA: Diagnosis present

## 2024-03-02 DIAGNOSIS — J181 Lobar pneumonia, unspecified organism: Secondary | ICD-10-CM | POA: Diagnosis present

## 2024-03-02 DIAGNOSIS — F32A Depression, unspecified: Secondary | ICD-10-CM | POA: Diagnosis present

## 2024-03-02 DIAGNOSIS — Z791 Long term (current) use of non-steroidal anti-inflammatories (NSAID): Secondary | ICD-10-CM | POA: Diagnosis not present

## 2024-03-02 DIAGNOSIS — M069 Rheumatoid arthritis, unspecified: Secondary | ICD-10-CM | POA: Diagnosis present

## 2024-03-02 DIAGNOSIS — J189 Pneumonia, unspecified organism: Secondary | ICD-10-CM | POA: Diagnosis not present

## 2024-03-02 DIAGNOSIS — I10 Essential (primary) hypertension: Secondary | ICD-10-CM | POA: Diagnosis present

## 2024-03-02 DIAGNOSIS — Z90710 Acquired absence of both cervix and uterus: Secondary | ICD-10-CM | POA: Diagnosis not present

## 2024-03-02 DIAGNOSIS — R0902 Hypoxemia: Secondary | ICD-10-CM | POA: Diagnosis present

## 2024-03-02 DIAGNOSIS — J441 Chronic obstructive pulmonary disease with (acute) exacerbation: Secondary | ICD-10-CM | POA: Diagnosis present

## 2024-03-02 DIAGNOSIS — Z9049 Acquired absence of other specified parts of digestive tract: Secondary | ICD-10-CM | POA: Diagnosis not present

## 2024-03-02 DIAGNOSIS — Z833 Family history of diabetes mellitus: Secondary | ICD-10-CM | POA: Diagnosis not present

## 2024-03-02 DIAGNOSIS — K219 Gastro-esophageal reflux disease without esophagitis: Secondary | ICD-10-CM | POA: Diagnosis present

## 2024-03-02 DIAGNOSIS — Z7985 Long-term (current) use of injectable non-insulin antidiabetic drugs: Secondary | ICD-10-CM | POA: Diagnosis not present

## 2024-03-02 DIAGNOSIS — Z8249 Family history of ischemic heart disease and other diseases of the circulatory system: Secondary | ICD-10-CM | POA: Diagnosis not present

## 2024-03-02 DIAGNOSIS — E785 Hyperlipidemia, unspecified: Secondary | ICD-10-CM | POA: Diagnosis present

## 2024-03-02 DIAGNOSIS — A419 Sepsis, unspecified organism: Secondary | ICD-10-CM | POA: Diagnosis present

## 2024-03-02 DIAGNOSIS — E119 Type 2 diabetes mellitus without complications: Secondary | ICD-10-CM | POA: Diagnosis present

## 2024-03-02 DIAGNOSIS — Z87891 Personal history of nicotine dependence: Secondary | ICD-10-CM | POA: Diagnosis not present

## 2024-03-02 DIAGNOSIS — Z79899 Other long term (current) drug therapy: Secondary | ICD-10-CM | POA: Diagnosis not present

## 2024-03-02 DIAGNOSIS — J9601 Acute respiratory failure with hypoxia: Secondary | ICD-10-CM | POA: Diagnosis present

## 2024-03-02 DIAGNOSIS — Z1152 Encounter for screening for COVID-19: Secondary | ICD-10-CM | POA: Diagnosis not present

## 2024-03-02 DIAGNOSIS — Z794 Long term (current) use of insulin: Secondary | ICD-10-CM | POA: Diagnosis not present

## 2024-03-02 LAB — COMPREHENSIVE METABOLIC PANEL WITH GFR
ALT: 21 U/L (ref 0–44)
AST: 23 U/L (ref 15–41)
Albumin: 3.6 g/dL (ref 3.5–5.0)
Alkaline Phosphatase: 73 U/L (ref 38–126)
Anion gap: 12 (ref 5–15)
BUN: 13 mg/dL (ref 8–23)
CO2: 23 mmol/L (ref 22–32)
Calcium: 9.2 mg/dL (ref 8.9–10.3)
Chloride: 101 mmol/L (ref 98–111)
Creatinine, Ser: 0.77 mg/dL (ref 0.44–1.00)
GFR, Estimated: >60 mL/min (ref >60)
Glucose, Bld: 268 mg/dL — ABNORMAL HIGH (ref 70–99)
Potassium: 5.1 mmol/L (ref 3.5–5.1)
Sodium: 136 mmol/L (ref 135–145)
Total Bilirubin: 0.5 mg/dL (ref 0.0–1.2)
Total Protein: 6.8 g/dL (ref 6.5–8.1)

## 2024-03-02 LAB — GLUCOSE, CAPILLARY
Glucose-Capillary: 220 mg/dL — ABNORMAL HIGH (ref 70–99)
Glucose-Capillary: 257 mg/dL — ABNORMAL HIGH (ref 70–99)
Glucose-Capillary: 375 mg/dL — ABNORMAL HIGH (ref 70–99)
Glucose-Capillary: 413 mg/dL — ABNORMAL HIGH (ref 70–99)

## 2024-03-02 LAB — CBC
HCT: 45.6 % (ref 36.0–46.0)
Hemoglobin: 14.5 g/dL (ref 12.0–15.0)
MCH: 28.3 pg (ref 26.0–34.0)
MCHC: 31.8 g/dL (ref 30.0–36.0)
MCV: 88.9 fL (ref 80.0–100.0)
Platelets: 144 K/uL — ABNORMAL LOW (ref 150–400)
RBC: 5.13 MIL/uL — ABNORMAL HIGH (ref 3.87–5.11)
RDW: 15.9 % — ABNORMAL HIGH (ref 11.5–15.5)
WBC: 7.4 K/uL (ref 4.0–10.5)
nRBC: 0 % (ref 0.0–0.2)

## 2024-03-02 LAB — URINALYSIS, ROUTINE W REFLEX MICROSCOPIC
Bacteria, UA: NONE SEEN
Bilirubin Urine: NEGATIVE
Glucose, UA: >=500 mg/dL — AB
Hgb urine dipstick: NEGATIVE
Ketones, ur: NEGATIVE mg/dL
Leukocytes,Ua: NEGATIVE
Nitrite: NEGATIVE
Protein, ur: NEGATIVE mg/dL
Specific Gravity, Urine: 1.015 (ref 1.005–1.030)
pH: 5 (ref 5.0–8.0)

## 2024-03-02 LAB — PROCALCITONIN: Procalcitonin: <0.1 ng/mL

## 2024-03-02 LAB — STREP PNEUMONIAE URINARY ANTIGEN: Strep Pneumo Urinary Antigen: NEGATIVE

## 2024-03-02 LAB — GLUCOSE, RANDOM: Glucose, Bld: 381 mg/dL — ABNORMAL HIGH (ref 70–99)

## 2024-03-02 MED ORDER — INSULIN ASPART 100 UNIT/ML IJ SOLN
0.0000 [IU] | Freq: Every day | INTRAMUSCULAR | Status: DC
  Administered 2024-03-03: 2 [IU] via SUBCUTANEOUS
  Filled 2024-03-02: qty 2

## 2024-03-02 MED ORDER — GABAPENTIN 300 MG PO CAPS
600.0000 mg | ORAL_CAPSULE | Freq: Every day | ORAL | Status: DC
  Administered 2024-03-02 – 2024-03-03 (×3): 600 mg via ORAL
  Filled 2024-03-02 (×3): qty 2

## 2024-03-02 MED ORDER — SERTRALINE HCL 100 MG PO TABS
100.0000 mg | ORAL_TABLET | Freq: Every day | ORAL | Status: DC
  Administered 2024-03-02 – 2024-03-04 (×3): 100 mg via ORAL
  Filled 2024-03-02 (×3): qty 1

## 2024-03-02 MED ORDER — PENTOSAN POLYSULFATE SODIUM 100 MG PO CAPS
100.0000 mg | ORAL_CAPSULE | Freq: Two times a day (BID) | ORAL | Status: DC
  Administered 2024-03-02 – 2024-03-04 (×5): 100 mg via ORAL
  Filled 2024-03-02 (×6): qty 1

## 2024-03-02 MED ORDER — PANTOPRAZOLE SODIUM 40 MG PO TBEC
80.0000 mg | DELAYED_RELEASE_TABLET | Freq: Every day | ORAL | Status: DC
  Administered 2024-03-02 – 2024-03-04 (×3): 80 mg via ORAL
  Filled 2024-03-02 (×3): qty 2

## 2024-03-02 MED ORDER — INSULIN ASPART 100 UNIT/ML IJ SOLN
6.0000 [IU] | Freq: Three times a day (TID) | INTRAMUSCULAR | Status: DC
  Administered 2024-03-02 – 2024-03-03 (×4): 6 [IU] via SUBCUTANEOUS
  Filled 2024-03-02 (×4): qty 6

## 2024-03-02 MED ORDER — PROPYLENE GLYCOL (PF) 0.6 % OP SOLN: 1.0000 [drp] | Freq: Every day | OPHTHALMIC | Status: DC

## 2024-03-02 MED ORDER — BUDESON-GLYCOPYRROL-FORMOTEROL 160-9-4.8 MCG/ACT IN AERO
2.0000 | INHALATION_SPRAY | Freq: Two times a day (BID) | RESPIRATORY_TRACT | Status: DC
  Administered 2024-03-02 – 2024-03-04 (×5): 2 via RESPIRATORY_TRACT
  Filled 2024-03-02: qty 5.9

## 2024-03-02 MED ORDER — PHENOL 1.4 % MT LIQD
1.0000 | OROMUCOSAL | Status: DC | PRN
  Administered 2024-03-02: 1 via OROMUCOSAL
  Filled 2024-03-02: qty 177

## 2024-03-02 MED ORDER — SIMVASTATIN 40 MG PO TABS
40.0000 mg | ORAL_TABLET | Freq: Every day | ORAL | Status: DC
  Administered 2024-03-02 – 2024-03-03 (×2): 40 mg via ORAL
  Filled 2024-03-02 (×2): qty 1

## 2024-03-02 MED ORDER — INSULIN ASPART 100 UNIT/ML IJ SOLN
6.0000 [IU] | Freq: Once | INTRAMUSCULAR | Status: AC
  Administered 2024-03-02: 6 [IU] via SUBCUTANEOUS
  Filled 2024-03-02: qty 6

## 2024-03-02 MED ORDER — IPRATROPIUM-ALBUTEROL 0.5-2.5 (3) MG/3ML IN SOLN
3.0000 mL | RESPIRATORY_TRACT | Status: DC
  Administered 2024-03-02 – 2024-03-03 (×10): 3 mL via RESPIRATORY_TRACT
  Filled 2024-03-02 (×11): qty 3

## 2024-03-02 MED ORDER — URELLE 81 MG PO TABS
1.0000 | ORAL_TABLET | Freq: Four times a day (QID) | ORAL | Status: DC
  Administered 2024-03-02 – 2024-03-04 (×9): 81 mg via ORAL
  Filled 2024-03-02 (×11): qty 1

## 2024-03-02 MED ORDER — INSULIN GLARGINE-YFGN 100 UNIT/ML ~~LOC~~ SOLN
10.0000 [IU] | Freq: Every day | SUBCUTANEOUS | Status: DC
  Filled 2024-03-02: qty 0.1

## 2024-03-02 MED ORDER — INSULIN GLARGINE-YFGN 100 UNIT/ML ~~LOC~~ SOLN
50.0000 [IU] | Freq: Two times a day (BID) | SUBCUTANEOUS | Status: DC
  Administered 2024-03-02 – 2024-03-04 (×5): 50 [IU] via SUBCUTANEOUS
  Filled 2024-03-02 (×6): qty 0.5

## 2024-03-02 MED ORDER — FOLIC ACID 1 MG PO TABS
1.0000 mg | ORAL_TABLET | Freq: Every day | ORAL | Status: DC
  Administered 2024-03-02 – 2024-03-04 (×3): 1 mg via ORAL
  Filled 2024-03-02 (×3): qty 1

## 2024-03-02 MED ORDER — POLYVINYL ALCOHOL 1.4 % OP SOLN
1.0000 [drp] | Freq: Every day | OPHTHALMIC | Status: DC
  Administered 2024-03-02 – 2024-03-03 (×2): 1 [drp] via OPHTHALMIC
  Filled 2024-03-02: qty 15

## 2024-03-02 MED ORDER — INSULIN ASPART 100 UNIT/ML IJ SOLN
0.0000 [IU] | Freq: Three times a day (TID) | INTRAMUSCULAR | Status: DC
  Administered 2024-03-02: 9 [IU] via SUBCUTANEOUS
  Administered 2024-03-02 – 2024-03-03 (×2): 5 [IU] via SUBCUTANEOUS
  Administered 2024-03-03: 2 [IU] via SUBCUTANEOUS
  Administered 2024-03-03: 1 [IU] via SUBCUTANEOUS
  Filled 2024-03-02: qty 1
  Filled 2024-03-02: qty 2
  Filled 2024-03-02: qty 9
  Filled 2024-03-02 (×2): qty 5

## 2024-03-02 MED ORDER — AZITHROMYCIN 250 MG PO TABS
500.0000 mg | ORAL_TABLET | Freq: Every day | ORAL | Status: DC
  Administered 2024-03-02 – 2024-03-03 (×2): 500 mg via ORAL
  Filled 2024-03-02 (×2): qty 2

## 2024-03-02 MED ORDER — GABAPENTIN 300 MG PO CAPS: 600.0000 mg | ORAL_CAPSULE | Freq: Every day | ORAL | Status: DC

## 2024-03-02 MED ORDER — MONTELUKAST SODIUM 10 MG PO TABS
10.0000 mg | ORAL_TABLET | Freq: Every day | ORAL | Status: DC
  Administered 2024-03-02 – 2024-03-03 (×2): 10 mg via ORAL
  Filled 2024-03-02 (×2): qty 1

## 2024-03-02 MED ORDER — GUAIFENESIN-DM 100-10 MG/5ML PO SYRP
5.0000 mL | ORAL_SOLUTION | ORAL | Status: DC | PRN
  Administered 2024-03-03 (×3): 5 mL via ORAL
  Filled 2024-03-02 (×3): qty 5

## 2024-03-02 NOTE — Plan of Care (Signed)
  Problem: Education: ?Goal: Knowledge of General Education information will improve ?Description: Including pain rating scale, medication(s)/side effects and non-pharmacologic comfort measures ?Outcome: Progressing ?  ?Problem: Clinical Measurements: ?Goal: Will remain free from infection ?Outcome: Progressing ?Goal: Respiratory complications will improve ?Outcome: Progressing ?  ?Problem: Coping: ?Goal: Level of anxiety will decrease ?Outcome: Progressing ?  ?Problem: Safety: ?Goal: Ability to remain free from injury will improve ?Outcome: Progressing ?  ?

## 2024-03-02 NOTE — Progress Notes (Signed)
 [facility] PROGRESS NOTE    Progress Note  [name]  FMW:[id number] DOB: [birth year] DOA: 03/01/2024 PCP: Sheryle Carwin, MD     Brief Narrative:   [name] is an 73 y.o. female past medical history significant for diabetes mellitus type 2, obstructive sleep apnea rheumatoid arthritis comes in complaining of cough and shortness of breath that started about 1 week prior to admission.  Started having fevers 2 days prior to admission, in the ED was found to have a fever of with leukocytosis, hypoxic and chest x-ray showed patchy opacities in the right lower lobe with a small pleural effusion.     Assessment/Plan:  Acute respiratory failure with hypoxia (HCC) Likely due to pneumonia. Placed on 2 L of oxygen to keep saturations greater than 85%. Started empirically on antibiotics and steroids. Out of bed to chair, continue sensi spirometer and flutter valve. Consult PT OT  Sepsis due to pneumonia: Started on IV fluids IV empiric antibiotics. Blood cultures and cultures were sent.  Procalcitonin is low yield and respiratory panel is negative. She has defervesced and has remained febrile. Continue IV Rocephin  and azithromycin , blood cultures are remain negative till date.  Acute  COPD with acute exacerbation: She is not on oxygen at home, using a physical exam. She was started empirically on antibiotics inhalers and continue IV steroids. Continue flutter valve and incentive spirometry. Scheduled inhalers.  Recurrent vaginal candidiasis: Holding Jardiance started on Diflucan .  Essential hypertension: Holding Benicar  blood pressure was borderline on admission. Blood pressure has remained relatively stable.  Insulin -dependent diabetes mellitus type 2: With last A1c of 5.4 Start long-acting insulin  50 units twice a day, she is on steroids for COPD exacerbation which will make her blood glucose erratic. Start on sliding scale resistant scale. Started on 10 units of Lantus   plus sliding scale.  Hyperlipidemia: Obtains Zocor .  Interstitial cystitis: Continue current home medications.  Rheumatoid arthritis: Holding methotrexate continue folic acid .  Depression: Continue current medication.  GERD: Continue PPI.  Obstructive sleep apnea Continue CPAP at night    DVT prophylaxis: lovenox  Family Communication:none Status is: Observation The patient will require care spanning > 2 midnights and should be moved to inpatient because: Acute respiratory failure with hypoxia due to pneumonia.    Code Status:     Code Status Orders  (From admission, onward)           Start     Ordered   03/01/24 1908  Full code  Continuous       Question:  By:  Answer:  Consent: discussion documented in EHR   03/01/24 1908           Code Status History     Date Active Date Inactive Code Status Order ID Comments User Context   09/15/2022 0147 09/19/2022 1904 Full Code [id number]  Charlton Evalene RAMAN, MD Inpatient   07/29/2017 2105 08/01/2017 1733 Full Code [id number]  Luke Agent, MD Inpatient   09/16/2016 2053 09/21/2016 1648 Full Code [id number]  Charlton Evalene RAMAN, MD ED         IV Access:   Peripheral IV   Procedures and diagnostic studies:   DG Chest 2 View Result Date: 03/01/2024 EXAM: 2 VIEW(S) XRAY OF THE CHEST 03/01/2024 11:29:19 AM COMPARISON: 07/24/2023 CLINICAL HISTORY: SOB FINDINGS: LUNGS AND PLEURA: Patchy opacities in the right lower lung with adjacent small right pleural effusion. No pneumothorax. HEART AND MEDIASTINUM: Calcified aorta. No acute abnormality of the cardiac and mediastinal silhouettes. BONES AND SOFT  TISSUES: Thoracic degenerative changes. No acute osseous abnormality. IMPRESSION: 1. Patchy opacities in the right lower lung with adjacent small right pleural effusion, possibly representing infection or aspiration. 2. Aortic Atherosclerosis (ICD10-I70.0). Electronically signed by: Ryan Chess MD 03/01/2024 11:53 AM EST RP Workstation:  [id number]     Medical Consultants:   None.   Subjective:    [name] she relates her breathing is now better continues to cough.  Objective:    Vitals:   03/01/24 2135 03/02/24 0133 03/02/24 0157 03/02/24 0549  BP:  117/63  109/61  Pulse:  86  79  Resp:  19  19  Temp:  97.6 F (36.4 C)  98 F (36.7 C)  TempSrc:      SpO2: 98% 92% 95% 92%  Weight:      Height:       SpO2: 92 % O2 Flow Rate (L/min): 2 L/min FiO2 (%): 28 %   Intake/Output Summary (Last 24 hours) at 03/02/2024 0722 Last data filed at 03/02/2024 0039 Gross per 24 hour  Intake 1067.57 ml  Output --  Net 1067.57 ml   Filed Weights   03/01/24 1113  Weight: 112.5 kg    Exam: General exam: In no acute distress. Respiratory system: Good air movement and clear to auscultation. Cardiovascular system: S1 & S2 heard, RRR. No JVD. Gastrointestinal system: Abdomen is nondistended, soft and nontender.  Extremities: No pedal edema. Skin: No rashes, lesions or ulcers Psychiatry: Judgement and insight appear normal. Mood & affect appropriate.    Data Reviewed:    Labs: Basic Metabolic Panel: Recent Labs  Lab 03/01/24 1142 03/02/24 0448  NA 138 136  K 4.3 5.1  CL 102 101  CO2 26 23  GLUCOSE 176* 268*  BUN 12 13  CREATININE 0.80 0.77  CALCIUM 10.2 9.2   GFR Estimated Creatinine Clearance: 74.3 mL/min (by C-G formula based on SCr of 0.77 mg/dL). Liver Function Tests: Recent Labs  Lab 03/02/24 0448  AST 23  ALT 21  ALKPHOS 73  BILITOT 0.5  PROT 6.8  ALBUMIN 3.6   No results for input(s): LIPASE, AMYLASE in the last 168 hours. No results for input(s): AMMONIA in the last 168 hours. Coagulation profile No results for input(s): INR, PROTIME in the last 168 hours. COVID-19 Labs  No results for input(s): DDIMER, FERRITIN, LDH, CRP in the last 72 hours.  Lab Results  Component Value Date   SARSCOV2NAA NEGATIVE 03/01/2024   SARSCOV2NAA NEGATIVE 07/24/2023    SARSCOV2NAA NEGATIVE 09/14/2022   SARSCOV2NAA Not Detected 04/02/2019    CBC: Recent Labs  Lab 03/01/24 1142 03/02/24 0448  WBC 11.1* 7.4  HGB 14.1 14.5  HCT 44.3 45.6  MCV 87.9 88.9  PLT 175 144*   Cardiac Enzymes: No results for input(s): CKTOTAL, CKMB, CKMBINDEX, TROPONINI in the last 168 hours. BNP (last 3 results) No results for input(s): PROBNP in the last 8760 hours. CBG: Recent Labs  Lab 03/01/24 1518 03/01/24 2130  GLUCAP 111* 154*   D-Dimer: No results for input(s): DDIMER in the last 72 hours. Hgb A1c: Recent Labs    03/01/24 1915  HGBA1C 5.4   Lipid Profile: No results for input(s): CHOL, HDL, LDLCALC, TRIG, CHOLHDL, LDLDIRECT in the last 72 hours. Thyroid  function studies: No results for input(s): TSH, T4TOTAL, T3FREE, THYROIDAB in the last 72 hours.  Invalid input(s): FREET3 Anemia work up: No results for input(s): VITAMINB12, FOLATE, FERRITIN, TIBC, IRON, RETICCTPCT in the last 72 hours. Sepsis Labs: Recent Labs  Lab  03/01/24 1142 03/01/24 1915 03/01/24 2042 03/02/24 0448  PROCALCITON  --  <0.10  --  <0.10  WBC 11.1*  --   --  7.4  LATICACIDVEN  --  2.2* 0.9  --    Microbiology Recent Results (from the past 240 hours)  Resp panel by RT-PCR (RSV, Flu A&B, Covid) Anterior Nasal Swab     Status: None   Collection Time: 03/01/24 11:16 AM   Specimen: Anterior Nasal Swab  Result Value Ref Range Status   SARS Coronavirus 2 by RT PCR NEGATIVE NEGATIVE Final    Comment: (NOTE) SARS-CoV-2 target nucleic acids are NOT DETECTED.  The SARS-CoV-2 RNA is generally detectable in upper respiratory specimens during the acute phase of infection. The lowest concentration of SARS-CoV-2 viral copies this assay can detect is 138 copies/mL. A negative result does not preclude SARS-Cov-2 infection and should not be used as the sole basis for treatment or other patient management decisions. A negative result may  occur with  improper specimen collection/handling, submission of specimen other than nasopharyngeal swab, presence of viral mutation(s) within the areas targeted by this assay, and inadequate number of viral copies(<138 copies/mL). A negative result must be combined with clinical observations, patient history, and epidemiological information. The expected result is Negative.  Fact Sheet for Patients:  bloggercourse.com  Fact Sheet for Healthcare Providers:  seriousbroker.it  This test is no t yet approved or cleared by the [place]  FDA and  has been authorized for detection and/or diagnosis of SARS-CoV-2 by FDA under an Emergency Use Authorization (EUA). This EUA will remain  in effect (meaning this test can be used) for the duration of the COVID-19 declaration under Section 564(b)(1) of the Act, 21 U.S.C.section 360bbb-3(b)(1), unless the authorization is terminated  or revoked sooner.       Influenza A by PCR NEGATIVE NEGATIVE Final   Influenza B by PCR NEGATIVE NEGATIVE Final    Comment: (NOTE) The Xpert Xpress SARS-CoV-2/FLU/RSV plus assay is intended as an aid in the diagnosis of influenza from Nasopharyngeal swab specimens and should not be used as a sole basis for treatment. Nasal washings and aspirates are unacceptable for Xpert Xpress SARS-CoV-2/FLU/RSV testing.  Fact Sheet for Patients: bloggercourse.com  Fact Sheet for Healthcare Providers: seriousbroker.it  This test is not yet approved or cleared by the [place]  FDA and has been authorized for detection and/or diagnosis of SARS-CoV-2 by FDA under an Emergency Use Authorization (EUA). This EUA will remain in effect (meaning this test can be used) for the duration of the COVID-19 declaration under Section 564(b)(1) of the Act, 21 U.S.C. section 360bbb-3(b)(1), unless the authorization is terminated  or revoked.     Resp Syncytial Virus by PCR NEGATIVE NEGATIVE Final    Comment: (NOTE) Fact Sheet for Patients: bloggercourse.com  Fact Sheet for Healthcare Providers: seriousbroker.it  This test is not yet approved or cleared by the [place]  FDA and has been authorized for detection and/or diagnosis of SARS-CoV-2 by FDA under an Emergency Use Authorization (EUA). This EUA will remain in effect (meaning this test can be used) for the duration of the COVID-19 declaration under Section 564(b)(1) of the Act, 21 U.S.C. section 360bbb-3(b)(1), unless the authorization is terminated or revoked.  Performed at [facility], [address], [place], [place] [address]   Respiratory (~20 pathogens) panel by PCR     Status: None   Collection Time: 03/01/24  4:06 PM   Specimen: Nasopharyngeal Swab; Respiratory  Result Value Ref Range Status  Adenovirus NOT DETECTED NOT DETECTED Final   Coronavirus 229E NOT DETECTED NOT DETECTED Final    Comment: (NOTE) The Coronavirus on the Respiratory Panel, DOES NOT test for the novel  Coronavirus (2019 nCoV)    Coronavirus HKU1 NOT DETECTED NOT DETECTED Final   Coronavirus NL63 NOT DETECTED NOT DETECTED Final   Coronavirus OC43 NOT DETECTED NOT DETECTED Final   Metapneumovirus NOT DETECTED NOT DETECTED Final   Rhinovirus / Enterovirus NOT DETECTED NOT DETECTED Final   Influenza A NOT DETECTED NOT DETECTED Final   Influenza B NOT DETECTED NOT DETECTED Final   Parainfluenza Virus 1 NOT DETECTED NOT DETECTED Final   Parainfluenza Virus 2 NOT DETECTED NOT DETECTED Final   Parainfluenza Virus 3 NOT DETECTED NOT DETECTED Final   Parainfluenza Virus 4 NOT DETECTED NOT DETECTED Final   Respiratory Syncytial Virus NOT DETECTED NOT DETECTED Final   Bordetella pertussis NOT DETECTED NOT DETECTED Final   Bordetella Parapertussis NOT DETECTED NOT DETECTED Final   Chlamydophila  pneumoniae NOT DETECTED NOT DETECTED Final   Mycoplasma pneumoniae NOT DETECTED NOT DETECTED Final    Comment: Performed at [facility], [address] [place],  [address]     Medications:    artificial tears  1 drop Both Eyes QHS   budesonide -glycopyrrolate -formoterol   2 puff Inhalation BID   enoxaparin  (LOVENOX ) injection  40 mg Subcutaneous Q24H   folic acid   1 mg Oral Daily   gabapentin   600 mg Oral QHS   guaiFENesin   600 mg Oral BID   insulin  aspart  0-9 Units Subcutaneous TID WC   ipratropium-albuterol   3 mL Nebulization Q6H   montelukast   10 mg Oral QHS   pantoprazole   80 mg Oral Daily   pentosan polysulfate  100 mg Oral BID   predniSONE   40 mg Oral Q breakfast   sertraline   100 mg Oral Daily   simvastatin   40 mg Oral Q supper   Urelle   1 tablet Oral QID   Continuous Infusions:  sodium chloride  75 mL/hr at 03/02/24 0039   azithromycin      cefTRIAXone  (ROCEPHIN )  IV        LOS: 0 days   Erle Odell Castor  [facility]  03/02/2024, 7:22 AM

## 2024-03-02 NOTE — Evaluation (Signed)
 Physical Therapy Evaluation Patient Details Name: [name] MRN: [id number] DOB: [birth year] Today's Date: 03/02/2024  History of Present Illness  Pt admitted from home 2* SOB and dx with sepsis 2* PNA, COPD exacerbation, and acute respiratory failure with hypoxia  Clinical Impression  Pt admitted as above and presenting with functional mobility limitations 2* decreased activity tolerance and mild ambulatory balance deficits.  This date, pt up to ambulate ~245 in hall with SPC and SBA.  Pt taking several short standing rest breaks but able to maintain O2 sats at 90% or higher on room air.  Pt pleased with progress.        If plan is discharge home, recommend the following:     Can travel by private vehicle        Equipment Recommendations None recommended by PT  Recommendations for Other Services       Functional Status Assessment Patient has had a recent decline in their functional status and demonstrates the ability to make significant improvements in function in a reasonable and predictable amount of time.     Precautions / Restrictions Precautions Precautions: Fall Precaution/Restrictions Comments: Droplet precautions; Monitor O2 sats Restrictions Weight Bearing Restrictions Per Provider Order: No      Mobility  Bed Mobility Overal bed mobility: Modified Independent                  Transfers Overall transfer level: Needs assistance Equipment used: None Transfers: Sit to/from Stand Sit to Stand: Supervision           General transfer comment: for safety only    Ambulation/Gait Ambulation/Gait assistance: Supervision Gait Distance (Feet): 245 Feet Assistive device: Rolling walker (2 wheels) Gait Pattern/deviations: Step-to pattern, Step-through pattern, Decreased step length - right, Decreased step length - left, Shuffle, Wide base of support Gait velocity: decr     General Gait Details: Widened BOS for balance but with min instability and no  overt LOB.  Several standing rest breaks 2* mild SOB  Stairs            Wheelchair Mobility     Tilt Bed    Modified Rankin (Stroke Patients Only)       Balance Overall balance assessment: Mild deficits observed, not formally tested                                           Pertinent Vitals/Pain Pain Assessment Pain Assessment: No/denies pain    Home Living Family/patient expects to be discharged to:: Private residence Living Arrangements: Alone Available Help at Discharge: Family;Available PRN/intermittently Type of Home: House Home Access: Stairs to enter   Entrance Stairs-Number of Steps: 1   Home Layout: One level Home Equipment: Agricultural Consultant (2 wheels);Cane - single point      Prior Function Prior Level of Function : Independent/Modified Independent             Mobility Comments: using SPC most of the time       Extremity/Trunk Assessment   Upper Extremity Assessment Upper Extremity Assessment: Overall WFL for tasks assessed    Lower Extremity Assessment Lower Extremity Assessment: Overall WFL for tasks assessed       Communication   Communication Communication: No apparent difficulties    Cognition Arousal: Alert Behavior During Therapy: [facility] for tasks assessed/performed  Following commands: Intact       Cueing Cueing Techniques: Verbal cues     General Comments      Exercises     Assessment/Plan    PT Assessment Patient needs continued PT services  PT Problem List Decreased activity tolerance;Decreased balance       PT Treatment Interventions DME instruction;Gait training;Stair training;Functional mobility training;Therapeutic activities;Therapeutic exercise;Balance training;Patient/family education    PT Goals (Current goals can be found in the Care Plan section)  Acute Rehab PT Goals Patient Stated Goal: Regain IND and avoid needing O2 at home PT Goal  Formulation: With patient Time For Goal Achievement: 03/09/24 Potential to Achieve Goals: Good    Frequency Min 2X/week     Co-evaluation               AM-PAC PT 6 Clicks Mobility  Outcome Measure Help needed turning from your back to your side while in a flat bed without using bedrails?: None Help needed moving from lying on your back to sitting on the side of a flat bed without using bedrails?: None Help needed moving to and from a bed to a chair (including a wheelchair)?: None Help needed standing up from a chair using your arms (e.g., wheelchair or bedside chair)?: A Little Help needed to walk in hospital room?: A Little Help needed climbing 3-5 steps with a railing? : A Little 6 Click Score: 21    End of Session Equipment Utilized During Treatment: Gait belt Activity Tolerance: Patient tolerated treatment well Patient left: in chair;with call bell/phone within reach Nurse Communication: Mobility status PT Visit Diagnosis: Difficulty in walking, not elsewhere classified (R26.2)    Time: 8481-8462 PT Time Calculation (min) (ACUTE ONLY): 19 min   Charges:   PT Evaluation $PT Eval Low Complexity: 1 Low   PT General Charges $$ ACUTE PT VISIT: 1 Visit         [facility] PT Acute Rehabilitation Services Office [phone]   [facility] 03/02/2024, 3:44 PM

## 2024-03-02 NOTE — Plan of Care (Signed)
   Problem: Education: Goal: Knowledge of General Education information will improve Description Including pain rating scale, medication(s)/side effects and non-pharmacologic comfort measures Outcome: Progressing   Problem: Health Behavior/Discharge Planning: Goal: Ability to manage health-related needs will improve Outcome: Progressing

## 2024-03-03 DIAGNOSIS — Z794 Long term (current) use of insulin: Secondary | ICD-10-CM

## 2024-03-03 DIAGNOSIS — E119 Type 2 diabetes mellitus without complications: Secondary | ICD-10-CM | POA: Diagnosis not present

## 2024-03-03 DIAGNOSIS — J9601 Acute respiratory failure with hypoxia: Secondary | ICD-10-CM | POA: Diagnosis not present

## 2024-03-03 DIAGNOSIS — J189 Pneumonia, unspecified organism: Secondary | ICD-10-CM | POA: Diagnosis not present

## 2024-03-03 LAB — EXPECTORATED SPUTUM ASSESSMENT W GRAM STAIN, RFLX TO RESP C

## 2024-03-03 LAB — GLUCOSE, CAPILLARY
Glucose-Capillary: 145 mg/dL — ABNORMAL HIGH (ref 70–99)
Glucose-Capillary: 154 mg/dL — ABNORMAL HIGH (ref 70–99)
Glucose-Capillary: 293 mg/dL — ABNORMAL HIGH (ref 70–99)
Glucose-Capillary: 235 mg/dL — ABNORMAL HIGH (ref 70–99)

## 2024-03-03 MED ORDER — PREDNISONE 20 MG PO TABS
20.0000 mg | ORAL_TABLET | Freq: Every day | ORAL | Status: DC
  Administered 2024-03-04: 20 mg via ORAL
  Filled 2024-03-03: qty 1

## 2024-03-03 MED ORDER — AZITHROMYCIN 250 MG PO TABS: 250.0000 mg | ORAL_TABLET | Freq: Every day | ORAL | Status: DC

## 2024-03-03 MED ORDER — AMOXICILLIN-POT CLAVULANATE 875-125 MG PO TABS
1.0000 | ORAL_TABLET | Freq: Two times a day (BID) | ORAL | Status: DC
Start: 2024-03-03 — End: 2024-03-04
  Administered 2024-03-03 – 2024-03-04 (×3): 1 via ORAL
  Filled 2024-03-03 (×3): qty 1

## 2024-03-03 MED ORDER — AZITHROMYCIN 250 MG PO TABS
500.0000 mg | ORAL_TABLET | Freq: Every day | ORAL | Status: AC
  Administered 2024-03-04: 500 mg via ORAL
  Filled 2024-03-03: qty 2

## 2024-03-03 NOTE — Plan of Care (Signed)
  Problem: Education: Goal: Knowledge of General Education information will improve Description: Including pain rating scale, medication(s)/side effects and non-pharmacologic comfort measures Outcome: Progressing   Problem: Health Behavior/Discharge Planning: Goal: Ability to manage health-related needs will improve Outcome: Progressing   Problem: Clinical Measurements: Goal: Ability to maintain clinical measurements within normal limits will improve Outcome: Progressing Goal: Will remain free from infection Outcome: Progressing Goal: Diagnostic test results will improve Outcome: Progressing Goal: Respiratory complications will improve Outcome: Progressing Goal: Cardiovascular complication will be avoided Outcome: Progressing   Problem: Activity: Goal: Risk for activity intolerance will decrease Outcome: Progressing   Problem: Nutrition: Goal: Adequate nutrition will be maintained Outcome: Progressing   Problem: Coping: Goal: Level of anxiety will decrease Outcome: Progressing   Problem: Elimination: Goal: Will not experience complications related to bowel motility Outcome: Progressing Goal: Will not experience complications related to urinary retention Outcome: Progressing   Problem: Pain Managment: Goal: General experience of comfort will improve and/or be controlled Outcome: Progressing   Problem: Safety: Goal: Ability to remain free from injury will improve Outcome: Progressing   Problem: Skin Integrity: Goal: Risk for impaired skin integrity will decrease Outcome: Progressing   Problem: Education: Goal: Ability to describe self-care measures that may prevent or decrease complications (Diabetes Survival Skills Education) will improve Outcome: Progressing Goal: Individualized Educational Video(s) Outcome: Progressing   Problem: Coping: Goal: Ability to adjust to condition or change in health will improve Outcome: Progressing   Problem: Fluid  Volume: Goal: Ability to maintain a balanced intake and output will improve Outcome: Progressing   Problem: Health Behavior/Discharge Planning: Goal: Ability to identify and utilize available resources and services will improve Outcome: Progressing Goal: Ability to manage health-related needs will improve Outcome: Progressing   Problem: Metabolic: Goal: Ability to maintain appropriate glucose levels will improve Outcome: Progressing   Problem: Nutritional: Goal: Maintenance of adequate nutrition will improve Outcome: Progressing Goal: Progress toward achieving an optimal weight will improve Outcome: Progressing   Problem: Skin Integrity: Goal: Risk for impaired skin integrity will decrease Outcome: Progressing   Problem: Tissue Perfusion: Goal: Adequacy of tissue perfusion will improve Outcome: Progressing   Problem: Education: Goal: Knowledge of disease or condition will improve Outcome: Progressing Goal: Knowledge of the prescribed therapeutic regimen will improve Outcome: Progressing Goal: Individualized Educational Video(s) Outcome: Progressing   Problem: Activity: Goal: Ability to tolerate increased activity will improve Outcome: Progressing Goal: Will verbalize the importance of balancing activity with adequate rest periods Outcome: Progressing   Problem: Respiratory: Goal: Ability to maintain a clear airway will improve Outcome: Progressing Goal: Levels of oxygenation will improve Outcome: Progressing Goal: Ability to maintain adequate ventilation will improve Outcome: Progressing   Problem: Activity: Goal: Ability to tolerate increased activity will improve Outcome: Progressing   Problem: Clinical Measurements: Goal: Ability to maintain a body temperature in the normal range will improve Outcome: Progressing   Problem: Respiratory: Goal: Ability to maintain adequate ventilation will improve Outcome: Progressing Goal: Ability to maintain a clear  airway will improve Outcome: Progressing

## 2024-03-03 NOTE — Progress Notes (Signed)
 Mobility Specialist - Progress Note   03/03/24 1000  Mobility  Activity Ambulated with assistance  Level of Assistance Standby assist, set-up cues, supervision of patient - no hands on  Assistive Device Cane  Distance Ambulated (ft) 300 ft  Range of Motion/Exercises Active  Activity Response Tolerated well  Mobility visit 1 Mobility  Mobility Specialist Start Time (ACUTE ONLY) 0945  Mobility Specialist Stop Time (ACUTE ONLY) 1000  Mobility Specialist Time Calculation (min) (ACUTE ONLY) 15 min   Pt was found on recliner chair and agreeable to mobilize. Grew fatigued. At EOS returned to recliner chair with all needs met. Call bell in reach.   Erminio Leos,  Mobility Specialist Can be reached via Secure Chat

## 2024-03-03 NOTE — Progress Notes (Signed)
   03/03/24 0035  BiPAP/CPAP/SIPAP  $ Non-Invasive Home Ventilator  Subsequent  BiPAP/CPAP/SIPAP Pt Type Adult  BiPAP/CPAP/SIPAP Resmed  Mask Type Nasal mask  Dentures removed? Not applicable  Mask Size Small  FiO2 (%) 21 %  Patient Home Machine No  Patient Home Mask No  Patient Home Tubing No  Auto Titrate Yes  Minimum cmH2O 8 cmH2O  Maximum cmH2O 20 cmH2O  Device Plugged into [employer] Yes

## 2024-03-03 NOTE — Progress Notes (Signed)
 [facility] PROGRESS NOTE    Progress Note  [name]  FMW:[id number] DOB: 1950/05/16 DOA: 03/01/2024 PCP: Sheryle Carwin, MD     Brief Narrative:   [name] is an 73 y.o. female past medical history significant for diabetes mellitus type 2, obstructive sleep apnea rheumatoid arthritis comes in complaining of cough and shortness of breath that started about 1 week prior to admission.  Started having fevers 2 days prior to admission, in the ED was found to have a fever of with leukocytosis, hypoxic and chest x-ray showed patchy opacities in the right lower lobe with a small pleural effusion.  Assessment/Plan:  Acute respiratory failure with hypoxia (HCC) Likely due to pneumonia. She has been weaned to room air. Out of bed to chair, continue sensi spirometer and flutter valve. Consult PT OT  Sepsis due to pneumonia: IV fluids, continue IV antibiotics. Blood cultures and cultures were sent.   Has remained afebrile blood cultures have been negative till date. Transition antibiotics to orals.  Acute  COPD with acute exacerbation: She is not on oxygen at home, using a physical exam. Continue steroids antibiotics and inhalers. Titrate steroids down. Continue incentive spirometry.  Recurrent vaginal candidiasis: Holding Jardiance, continue on Diflucan .  Essential hypertension: Blood pressure continues to be borderline continue to hold antihypertensive of medication.  Insulin -dependent diabetes mellitus type 2: With last A1c of 5.4. Continue long-acting insulin  plus sliding scale insulin . Blood glucose well-controlled this morning.  Hyperlipidemia: Obtains Zocor .  Interstitial cystitis: Continue current home medications.  Rheumatoid arthritis: Holding methotrexate continue folic acid .  Depression: Continue current medication.  GERD: Continue PPI.  Obstructive sleep apnea Continue CPAP at night    DVT prophylaxis: lovenox  Family  Communication:none Status is: Observation The patient will require care spanning > 2 midnights and should be moved to inpatient because: Acute respiratory failure with hypoxia due to pneumonia.    Code Status:     Code Status Orders  (From admission, onward)           Start     Ordered   03/01/24 1908  Full code  Continuous       Question:  By:  Answer:  Consent: discussion documented in EHR   03/01/24 1908           Code Status History     Date Active Date Inactive Code Status Order ID Comments User Context   09/15/2022 0147 09/19/2022 1904 Full Code [id number]  Charlton Evalene RAMAN, MD Inpatient   07/29/2017 2105 08/01/2017 1733 Full Code [id number]  Luke Agent, MD Inpatient   09/16/2016 2053 09/21/2016 1648 Full Code [id number]  Charlton Evalene RAMAN, MD ED         IV Access:   Peripheral IV   Procedures and diagnostic studies:   DG Chest 2 View Result Date: 03/01/2024 EXAM: 2 VIEW(S) XRAY OF THE CHEST 03/01/2024 11:29:19 AM COMPARISON: 07/24/2023 CLINICAL HISTORY: SOB FINDINGS: LUNGS AND PLEURA: Patchy opacities in the right lower lung with adjacent small right pleural effusion. No pneumothorax. HEART AND MEDIASTINUM: Calcified aorta. No acute abnormality of the cardiac and mediastinal silhouettes. BONES AND SOFT TISSUES: Thoracic degenerative changes. No acute osseous abnormality. IMPRESSION: 1. Patchy opacities in the right lower lung with adjacent small right pleural effusion, possibly representing infection or aspiration. 2. Aortic Atherosclerosis (ICD10-I70.0). Electronically signed by: Ryan Chess MD 03/01/2024 11:53 AM EST RP Workstation: [id number]     Medical Consultants:   None.   Subjective:    [name] cough  is better, she relates that her breathing is improved.  Objective:    Vitals:   03/03/24 0311 03/03/24 0551 03/03/24 0730 03/03/24 0734  BP:  115/67    Pulse:  68    Resp:  17    Temp:  97.7 F (36.5 C)    TempSrc:      SpO2: 97% 96% 97%  97%  Weight:      Height:       SpO2: 97 % O2 Flow Rate (L/min): 2 L/min FiO2 (%): 21 %   Intake/Output Summary (Last 24 hours) at 03/03/2024 0929 Last data filed at 03/03/2024 0600 Gross per 24 hour  Intake 1151.2 ml  Output --  Net 1151.2 ml   Filed Weights   03/01/24 1113  Weight: 112.5 kg    Exam: General exam: In no acute distress. Respiratory system: Good air movement and clear to auscultation. Cardiovascular system: S1 & S2 heard, RRR. No JVD. Gastrointestinal system: Abdomen is nondistended, soft and nontender.  Extremities: No pedal edema. Skin: No rashes, lesions or ulcers Psychiatry: Judgement and insight appear normal. Mood & affect appropriate.  Data Reviewed:    Labs: Basic Metabolic Panel: Recent Labs  Lab 03/01/24 1142 03/02/24 0448 03/02/24 2154  NA 138 136  --   K 4.3 5.1  --   CL 102 101  --   CO2 26 23  --   GLUCOSE 176* 268* 381*  BUN 12 13  --   CREATININE 0.80 0.77  --   CALCIUM 10.2 9.2  --    GFR Estimated Creatinine Clearance: 74.3 mL/min (by C-G formula based on SCr of 0.77 mg/dL). Liver Function Tests: Recent Labs  Lab 03/02/24 0448  AST 23  ALT 21  ALKPHOS 73  BILITOT 0.5  PROT 6.8  ALBUMIN 3.6   No results for input(s): LIPASE, AMYLASE in the last 168 hours. No results for input(s): AMMONIA in the last 168 hours. Coagulation profile No results for input(s): INR, PROTIME in the last 168 hours. COVID-19 Labs  No results for input(s): DDIMER, FERRITIN, LDH, CRP in the last 72 hours.  Lab Results  Component Value Date   SARSCOV2NAA NEGATIVE 03/01/2024   SARSCOV2NAA NEGATIVE 07/24/2023   SARSCOV2NAA NEGATIVE 09/14/2022   SARSCOV2NAA Not Detected 04/02/2019    CBC: Recent Labs  Lab 03/01/24 1142 03/02/24 0448  WBC 11.1* 7.4  HGB 14.1 14.5  HCT 44.3 45.6  MCV 87.9 88.9  PLT 175 144*   Cardiac Enzymes: No results for input(s): CKTOTAL, CKMB, CKMBINDEX, TROPONINI in the last 168  hours. BNP (last 3 results) No results for input(s): PROBNP in the last 8760 hours. CBG: Recent Labs  Lab 03/02/24 0804 03/02/24 1242 03/02/24 1635 03/02/24 2127 03/03/24 0819  GLUCAP 220* 257* 375* 413* 145*   D-Dimer: No results for input(s): DDIMER in the last 72 hours. Hgb A1c: Recent Labs    03/01/24 1915  HGBA1C 5.4   Lipid Profile: No results for input(s): CHOL, HDL, LDLCALC, TRIG, CHOLHDL, LDLDIRECT in the last 72 hours. Thyroid  function studies: No results for input(s): TSH, T4TOTAL, T3FREE, THYROIDAB in the last 72 hours.  Invalid input(s): FREET3 Anemia work up: No results for input(s): VITAMINB12, FOLATE, FERRITIN, TIBC, IRON, RETICCTPCT in the last 72 hours. Sepsis Labs: Recent Labs  Lab 03/01/24 1142 03/01/24 1915 03/01/24 2042 03/02/24 0448  PROCALCITON  --  <0.10  --  <0.10  WBC 11.1*  --   --  7.4  LATICACIDVEN  --  2.2* 0.9  --  Microbiology Recent Results (from the past 240 hours)  Expectorated Sputum Assessment w Gram Stain, Rflx to Resp Cult     Status: None   Collection Time: 03/01/24  8:25 AM   Specimen: Sputum  Result Value Ref Range Status   Specimen Description SPUTUM  Final   Special Requests NONE  Final   Sputum evaluation   Final    THIS SPECIMEN IS ACCEPTABLE FOR SPUTUM CULTURE Performed at [facility], [address], [place], [place] [address]    Report Status 03/02/2024 FINAL  Final  Culture, Respiratory w Gram Stain     Status: None (Preliminary result)   Collection Time: 03/01/24  8:25 AM   Specimen: SPU  Result Value Ref Range Status   Specimen Description   Final    SPUTUM Performed at [facility], [address], [place], [place] [address]    Special Requests   Final    NONE Reflexed from [id number] Performed at [facility], [address], [place], [place] [address]    Gram Stain   Final    RARE WBC SEEN RARE GRAM  POSITIVE COCCI Performed at [facility], [address] [place], [place] [address]    Culture PENDING  Incomplete   Report Status PENDING  Incomplete  Resp panel by RT-PCR (RSV, Flu A&B, Covid) Anterior Nasal Swab     Status: None   Collection Time: 03/01/24 11:16 AM   Specimen: Anterior Nasal Swab  Result Value Ref Range Status   SARS Coronavirus 2 by RT PCR NEGATIVE NEGATIVE Final    Comment: (NOTE) SARS-CoV-2 target nucleic acids are NOT DETECTED.  The SARS-CoV-2 RNA is generally detectable in upper respiratory specimens during the acute phase of infection. The lowest concentration of SARS-CoV-2 viral copies this assay can detect is 138 copies/mL. A negative result does not preclude SARS-Cov-2 infection and should not be used as the sole basis for treatment or other patient management decisions. A negative result may occur with  improper specimen collection/handling, submission of specimen other than nasopharyngeal swab, presence of viral mutation(s) within the areas targeted by this assay, and inadequate number of viral copies(<138 copies/mL). A negative result must be combined with clinical observations, patient history, and epidemiological information. The expected result is Negative.  Fact Sheet for Patients:  bloggercourse.com  Fact Sheet for Healthcare Providers:  seriousbroker.it  This test is no t yet approved or cleared by the [place]  FDA and  has been authorized for detection and/or diagnosis of SARS-CoV-2 by FDA under an Emergency Use Authorization (EUA). This EUA will remain  in effect (meaning this test can be used) for the duration of the COVID-19 declaration under Section 564(b)(1) of the Act, 21 U.S.C.section 360bbb-3(b)(1), unless the authorization is terminated  or revoked sooner.       Influenza A by PCR NEGATIVE NEGATIVE Final   Influenza B by PCR NEGATIVE NEGATIVE Final    Comment:  (NOTE) The Xpert Xpress SARS-CoV-2/FLU/RSV plus assay is intended as an aid in the diagnosis of influenza from Nasopharyngeal swab specimens and should not be used as a sole basis for treatment. Nasal washings and aspirates are unacceptable for Xpert Xpress SARS-CoV-2/FLU/RSV testing.  Fact Sheet for Patients: bloggercourse.com  Fact Sheet for Healthcare Providers: seriousbroker.it  This test is not yet approved or cleared by the [place]  FDA and has been authorized for detection and/or diagnosis of SARS-CoV-2 by FDA under an Emergency Use Authorization (EUA). This EUA will  remain in effect (meaning this test can be used) for the duration of the COVID-19 declaration under Section 564(b)(1) of the Act, 21 U.S.C. section 360bbb-3(b)(1), unless the authorization is terminated or revoked.     Resp Syncytial Virus by PCR NEGATIVE NEGATIVE Final    Comment: (NOTE) Fact Sheet for Patients: bloggercourse.com  Fact Sheet for Healthcare Providers: seriousbroker.it  This test is not yet approved or cleared by the [place]  FDA and has been authorized for detection and/or diagnosis of SARS-CoV-2 by FDA under an Emergency Use Authorization (EUA). This EUA will remain in effect (meaning this test can be used) for the duration of the COVID-19 declaration under Section 564(b)(1) of the Act, 21 U.S.C. section 360bbb-3(b)(1), unless the authorization is terminated or revoked.  Performed at [facility], [address] [place], [place] [address]   Respiratory (~20 pathogens) panel by PCR     Status: None   Collection Time: 03/01/24  4:06 PM   Specimen: Nasopharyngeal Swab; Respiratory  Result Value Ref Range Status   Adenovirus NOT DETECTED NOT DETECTED Final   Coronavirus 229E NOT DETECTED NOT DETECTED Final    Comment: (NOTE) The Coronavirus on the Respiratory  Panel, DOES NOT test for the novel  Coronavirus (2019 nCoV)    Coronavirus HKU1 NOT DETECTED NOT DETECTED Final   Coronavirus NL63 NOT DETECTED NOT DETECTED Final   Coronavirus OC43 NOT DETECTED NOT DETECTED Final   Metapneumovirus NOT DETECTED NOT DETECTED Final   Rhinovirus / Enterovirus NOT DETECTED NOT DETECTED Final   Influenza A NOT DETECTED NOT DETECTED Final   Influenza B NOT DETECTED NOT DETECTED Final   Parainfluenza Virus 1 NOT DETECTED NOT DETECTED Final   Parainfluenza Virus 2 NOT DETECTED NOT DETECTED Final   Parainfluenza Virus 3 NOT DETECTED NOT DETECTED Final   Parainfluenza Virus 4 NOT DETECTED NOT DETECTED Final   Respiratory Syncytial Virus NOT DETECTED NOT DETECTED Final   Bordetella pertussis NOT DETECTED NOT DETECTED Final   Bordetella Parapertussis NOT DETECTED NOT DETECTED Final   Chlamydophila pneumoniae NOT DETECTED NOT DETECTED Final   Mycoplasma pneumoniae NOT DETECTED NOT DETECTED Final    Comment: Performed at [facility], [address] [place], [place] [address]  Culture, blood (Routine X 2) w Reflex to ID Panel     Status: None (Preliminary result)   Collection Time: 03/01/24  7:15 PM   Specimen: BLOOD LEFT ARM  Result Value Ref Range Status   Specimen Description   Final    BLOOD LEFT ARM Performed at [facility], [address] [place], [place] [address]    Special Requests   Final    BOTTLES DRAWN AEROBIC ONLY Blood Culture adequate volume Performed at [facility], [address] [place], [place] [address]    Culture   Final    NO GROWTH < 12 HOURS Performed at [facility], [address] [place], [place] [address]    Report Status PENDING  Incomplete  Culture, blood (Routine X 2) w Reflex to ID Panel     Status: None (Preliminary result)   Collection Time: 03/01/24  7:15 PM   Specimen: BLOOD LEFT ARM  Result Value Ref Range Status   Specimen Description   Final    BLOOD LEFT ARM Performed at  [facility], [address] [place], [place] [address]    Special Requests   Final    BOTTLES DRAWN AEROBIC ONLY Blood Culture adequate  volume Performed at [facility], [address], [place], [place] [address]    Culture   Final    NO GROWTH < 12 HOURS Performed at [facility], [address], [place], [place] [address]    Report Status PENDING  Incomplete     Medications:    artificial tears  1 drop Both Eyes QHS   azithromycin   500 mg Oral Daily   budesonide -glycopyrrolate -formoterol   2 puff Inhalation BID   enoxaparin  (LOVENOX ) injection  40 mg Subcutaneous Q24H   folic acid   1 mg Oral Daily   gabapentin   600 mg Oral QHS   guaiFENesin   600 mg Oral BID   insulin  aspart  0-5 Units Subcutaneous QHS   insulin  aspart  0-9 Units Subcutaneous TID WC   insulin  aspart  6 Units Subcutaneous TID WC   insulin  glargine-yfgn  50 Units Subcutaneous BID   ipratropium-albuterol   3 mL Nebulization Q4H   montelukast   10 mg Oral QHS   pantoprazole   80 mg Oral Daily   pentosan polysulfate  100 mg Oral BID   predniSONE   40 mg Oral Q breakfast   sertraline   100 mg Oral Daily   simvastatin   40 mg Oral Q supper   Urelle   1 tablet Oral QID   Continuous Infusions:  cefTRIAXone  (ROCEPHIN )  IV Stopped (03/02/24 1338)      LOS: 1 day   Erle Odell Castor  [facility]  03/03/2024, 9:29 AM

## 2024-03-04 DIAGNOSIS — J189 Pneumonia, unspecified organism: Secondary | ICD-10-CM | POA: Diagnosis not present

## 2024-03-04 DIAGNOSIS — J9601 Acute respiratory failure with hypoxia: Secondary | ICD-10-CM | POA: Diagnosis not present

## 2024-03-04 DIAGNOSIS — A419 Sepsis, unspecified organism: Secondary | ICD-10-CM

## 2024-03-04 LAB — CULTURE, RESPIRATORY W GRAM STAIN: Culture: NORMAL

## 2024-03-04 LAB — GLUCOSE, CAPILLARY: Glucose-Capillary: 84 mg/dL (ref 70–99)

## 2024-03-04 LAB — LEGIONELLA PNEUMOPHILA SEROGP 1 UR AG: L. pneumophila Serogp 1 Ur Ag: NEGATIVE

## 2024-03-04 MED ORDER — AZITHROMYCIN 500 MG PO TABS: 500.0000 mg | ORAL_TABLET | Freq: Every day | ORAL | 0 refills | Status: AC

## 2024-03-04 MED ORDER — AMOXICILLIN-POT CLAVULANATE 875-125 MG PO TABS: 1.0000 | ORAL_TABLET | Freq: Two times a day (BID) | ORAL | 0 refills | Status: AC

## 2024-03-04 MED ORDER — PREDNISONE 10 MG PO TABS: ORAL_TABLET | ORAL | 0 refills | Status: AC

## 2024-03-04 MED ORDER — IPRATROPIUM-ALBUTEROL 0.5-2.5 (3) MG/3ML IN SOLN
3.0000 mL | Freq: Four times a day (QID) | RESPIRATORY_TRACT | Status: DC
  Administered 2024-03-04: 3 mL via RESPIRATORY_TRACT
  Filled 2024-03-04: qty 3

## 2024-03-04 NOTE — Discharge Summary (Addendum)
 Physician Discharge Summary  [name] FMW:[id number] DOB: 01/13/1951 DOA: 03/01/2024  PCP: Sheryle Carwin, MD  Admit date: 03/01/2024 Discharge date: 03/04/2024  Admitted From: Home Disposition:  Home  Recommendations for Outpatient Follow-up:  Follow up with PCP in 1-2 weeks Please obtain BMP/CBC in one week   Home Health:No Equipment/Devices:None  Discharge Condition:Stable CODE STATUS:Full Diet recommendation: Heart Healthy  Brief/Interim Summary: 73 y.o. female past medical history significant for diabetes mellitus type 2, obstructive sleep apnea rheumatoid arthritis comes in complaining of cough and shortness of breath that started about 1 week prior to admission.  Started having fevers 2 days prior to admission, in the ED was found to have a fever of with leukocytosis, hypoxic and chest x-ray showed patchy opacities in the right lower lobe with a small pleural effusion.   Discharge Diagnoses:  Principal Problem:   Acute respiratory failure with hypoxia (HCC) Active Problems:   Sepsis due to pneumonia (HCC)   COPD with acute exacerbation (HCC)   recurrent Vaginal candida   HTN (hypertension)   Insulin -requiring or dependent type II diabetes mellitus (HCC)   Hyperlipidemia   Interstitial cystitis   Rheumatoid arthritis (HCC)   Depression   GERD (gastroesophageal reflux disease)   Obstructive sleep apnea   Lobar pneumonia  Acute respiratory failure with hypoxia due to COPD exacerbation She was darted on IV steroids inhalers and antibiotics we were able to wean her down to room air. She continue steroid taper as an outpatient.  Sever sepsis due to pneumonia: Started on Rocephin  and azithromycin  blood cultures remain negative. She was transition to oral antibiotics which should continue as an outpatient.  Vaginal candidiasis Jardiance was held she was continued on Diflucan  this resolved.  Essential hypertension: No changes made to her medication she will resume  them as an outpatient.  Insulin -dependent diabetes mellitus type 2: With last A1c of 5.4, changes made to her medication.  Hyperlipidemia: Continue Zocor .  Interstitial cystitis: Noted.  Rheumatoid arthritis: Methotrexate was held she will continue folic acid .  Depression: Changes made to her medication.   Discharge Instructions  Discharge Instructions     Diet - low sodium heart healthy   Complete by: As directed    Increase activity slowly   Complete by: As directed       Allergies as of 03/04/2024       Reactions   Demerol  Nausea And Vomiting, Other (See Comments)   Severe symptoms   Hydromorphone Hcl Itching, Other (See Comments)   All over the body itching   Tape Other (See Comments)   SKIN IS THIN!! PLEASE USE COBAN!!!   Tetracycline Hives        Medication List     STOP taking these medications    diclofenac  sodium 1 % Gel Commonly known as: VOLTAREN    fluconazole  100 MG tablet Commonly known as: DIFLUCAN    fluticasone  50 MCG/ACT nasal spray Commonly known as: FLONASE        TAKE these medications    albuterol  (2.5 MG/3ML) 0.083% nebulizer solution Commonly known as: PROVENTIL  Take 3 mLs (2.5 mg total) by nebulization every 6 (six) hours as needed for wheezing or shortness of breath (dx: J43.9). What changed: Another medication with the same name was removed. Continue taking this medication, and follow the directions you see here.   ALPRAZolam  0.5 MG tablet Commonly known as: XANAX  Take 0.25-0.5 mg by mouth 3 (three) times daily as needed for sleep or anxiety.   amoxicillin -clavulanate 875-125 MG tablet Commonly known  as: AUGMENTIN  Take 1 tablet by mouth every 12 (twelve) hours.   azithromycin  500 MG tablet Commonly known as: Zithromax  Take 1 tablet (500 mg total) by mouth daily for 3 days.   olmesartan  20 MG tablet Commonly known as: BENICAR  Take 20 mg by mouth in the morning.   Benicar  20 MG tablet Generic drug:  olmesartan  Take 0.5 tablets (10 mg total) by mouth daily.   Breztri  Aerosphere 160-9-4.8 MCG/ACT Aero inhaler Generic drug: budesonide -glycopyrrolate -formoterol  USE 2 INHALATIONS IN THE MORNING AND AT BEDTIME What changed: See the new instructions.   Elmiron  100 MG capsule Generic drug: pentosan polysulfate Take 2 tablets twice daily What changed:  how much to take how to take this when to take this additional instructions   empagliflozin 25 MG Tabs tablet Commonly known as: JARDIANCE Take 25 mg by mouth daily.   folic acid  1 MG tablet Commonly known as: FOLVITE  Take 1 mg by mouth daily.   furosemide 40 MG tablet Commonly known as: LASIX Take 20-40 mg by mouth daily as needed for edema or fluid.   gabapentin  600 MG tablet Commonly known as: NEURONTIN  Take 600 mg by mouth at bedtime.   HumaLOG KwikPen 100 UNIT/ML KwikPen Generic drug: insulin  lispro Inject 6-15 Units into the skin See admin instructions. Inject 6 units subcutaneously in the morning & 15 units at supper   hydrocortisone 2.5%-nystatin-zinc  oxide 20% 1:1:1 ointment mixture Apply topically 3 (three) times daily as needed. What changed:  how much to take reasons to take this   Ibuprofen 200 MG Caps Take 200 mg by mouth every 6 (six) hours as needed (for pain or inflammation).   Lantus  SoloStar 100 UNIT/ML Solostar Pen Generic drug: insulin  glargine Inject 10 Units into the skin daily. Inject 10 units into  the skin daily What changed:  how much to take when to take this additional instructions   levocetirizine 5 MG tablet Commonly known as: XYZAL Take 5 mg by mouth every evening.   metFORMIN  500 MG 24 hr tablet Commonly known as: GLUCOPHAGE -XR Take 2,000 mg by mouth daily with breakfast.   methotrexate 2.5 MG tablet Commonly known as: RHEUMATREX Take 2.5 mg by mouth every Saturday. Caution:Chemotherapy. Protect from light.   montelukast  10 MG tablet Commonly known as: SINGULAIR  TAKE 1  TABLET AT BEDTIME   omeprazole  40 MG capsule Commonly known as: PRILOSEC TAKE 1 CAPSULE EVERY MORNING What changed: when to take this   REMICADE  IV Inject 1 Dose into the vein every 6 (six) weeks.   sertraline  100 MG tablet Commonly known as: ZOLOFT  Take 100 mg by mouth in the morning.   simvastatin  40 MG tablet Commonly known as: ZOCOR  Take 40 mg by mouth daily with supper.   Systane Pro PF 0.6 % Soln Generic drug: Propylene Glycol (PF) Place 1 drop into both eyes in the morning and at bedtime.   Trulicity 3 MG/0.5ML Soaj Generic drug: Dulaglutide Inject 3 mg into the skin every Saturday.   Tylenol  8 Hour Arthritis Pain 650 MG CR tablet Generic drug: acetaminophen  Take 650-1,300 mg by mouth every 8 (eight) hours as needed for pain.   Urelle  81 MG Tabs tablet Take 1 tablet (81 mg total) by mouth 4 (four) times daily.        Allergies  Allergen Reactions   Demerol  Nausea And Vomiting and Other (See Comments)    Severe symptoms   Hydromorphone Hcl Itching and Other (See Comments)    All over the body itching  Tape Other (See Comments)    SKIN IS THIN!! PLEASE USE COBAN!!!   Tetracycline Hives    Consultations: None   Procedures/Studies: DG Chest 2 View Result Date: 03/01/2024 EXAM: 2 VIEW(S) XRAY OF THE CHEST 03/01/2024 11:29:19 AM COMPARISON: 07/24/2023 CLINICAL HISTORY: SOB FINDINGS: LUNGS AND PLEURA: Patchy opacities in the right lower lung with adjacent small right pleural effusion. No pneumothorax. HEART AND MEDIASTINUM: Calcified aorta. No acute abnormality of the cardiac and mediastinal silhouettes. BONES AND SOFT TISSUES: Thoracic degenerative changes. No acute osseous abnormality. IMPRESSION: 1. Patchy opacities in the right lower lung with adjacent small right pleural effusion, possibly representing infection or aspiration. 2. Aortic Atherosclerosis (ICD10-I70.0). Electronically signed by: Ryan Chess MD 03/01/2024 11:53 AM EST RP Workstation:  [id number]    Subjective: No complaints  Discharge Exam: Vitals:   03/03/24 1954 03/04/24 0528  BP: 121/61 121/73  Pulse: 69 77  Resp: 16 16  Temp: 98.1 F (36.7 C) 97.8 F (36.6 C)  SpO2: 95% 95%   Vitals:   03/03/24 1652 03/03/24 1913 03/03/24 1954 03/04/24 0528  BP:  126/62 121/61 121/73  Pulse:  77 69 77  Resp:  16 16 16   Temp:  98 F (36.7 C) 98.1 F (36.7 C) 97.8 F (36.6 C)  TempSrc:   Oral   SpO2: 97% 95% 95% 95%  Weight:      Height:        General: Pt is alert, awake, not in acute distress Cardiovascular: RRR, S1/S2 +, no rubs, no gallops Respiratory: CTA bilaterally, no wheezing, no rhonchi Abdominal: Soft, NT, ND, bowel sounds + Extremities: no edema, no cyanosis    The results of significant diagnostics from this hospitalization (including imaging, microbiology, ancillary and laboratory) are listed below for reference.     Microbiology: Recent Results (from the past 240 hours)  Expectorated Sputum Assessment w Gram Stain, Rflx to Resp Cult     Status: None   Collection Time: 03/01/24  8:25 AM   Specimen: Sputum  Result Value Ref Range Status   Specimen Description   Final    SPUTUM Performed at [facility], [address], [place], [place] [address]    Special Requests   Final    NONE Performed at [facility], [address] [place], [place] [address]    Sputum evaluation   Final    THIS SPECIMEN IS ACCEPTABLE FOR SPUTUM CULTURE Consistent with normal respiratory flora. Performed at [facility], [address], [place], [place] [address]    Report Status 03/02/2024 FINAL  Final  Culture, Respiratory w Gram Stain     Status: None (Preliminary result)   Collection Time: 03/01/24  8:25 AM   Specimen: SPU  Result Value Ref Range Status   Specimen Description   Final    SPUTUM Performed at [facility], [address] [place], [place] [address]    Special Requests   Final     NONE Reflexed from [id number] Performed at [facility], [address], [place], [place] [address]    Gram Stain   Final    RARE WBC SEEN RARE GRAM POSITIVE COCCI Performed at [facility], [address] [place], [place] [address]    Culture PENDING  Incomplete   Report Status PENDING  Incomplete  Resp panel by RT-PCR (RSV, Flu A&B, Covid) Anterior Nasal Swab     Status: None   Collection Time: 03/01/24 11:16 AM  Specimen: Anterior Nasal Swab  Result Value Ref Range Status   SARS Coronavirus 2 by RT PCR NEGATIVE NEGATIVE Final    Comment: (NOTE) SARS-CoV-2 target nucleic acids are NOT DETECTED.  The SARS-CoV-2 RNA is generally detectable in upper respiratory specimens during the acute phase of infection. The lowest concentration of SARS-CoV-2 viral copies this assay can detect is 138 copies/mL. A negative result does not preclude SARS-Cov-2 infection and should not be used as the sole basis for treatment or other patient management decisions. A negative result may occur with  improper specimen collection/handling, submission of specimen other than nasopharyngeal swab, presence of viral mutation(s) within the areas targeted by this assay, and inadequate number of viral copies(<138 copies/mL). A negative result must be combined with clinical observations, patient history, and epidemiological information. The expected result is Negative.  Fact Sheet for Patients:  bloggercourse.com  Fact Sheet for Healthcare Providers:  seriousbroker.it  This test is no t yet approved or cleared by the [place]  FDA and  has been authorized for detection and/or diagnosis of SARS-CoV-2 by FDA under an Emergency Use Authorization (EUA). This EUA will remain  in effect (meaning this test can be used) for the duration of the COVID-19 declaration under Section 564(b)(1) of the Act, 21 U.S.C.section 360bbb-3(b)(1),  unless the authorization is terminated  or revoked sooner.       Influenza A by PCR NEGATIVE NEGATIVE Final   Influenza B by PCR NEGATIVE NEGATIVE Final    Comment: (NOTE) The Xpert Xpress SARS-CoV-2/FLU/RSV plus assay is intended as an aid in the diagnosis of influenza from Nasopharyngeal swab specimens and should not be used as a sole basis for treatment. Nasal washings and aspirates are unacceptable for Xpert Xpress SARS-CoV-2/FLU/RSV testing.  Fact Sheet for Patients: bloggercourse.com  Fact Sheet for Healthcare Providers: seriousbroker.it  This test is not yet approved or cleared by the [place]  FDA and has been authorized for detection and/or diagnosis of SARS-CoV-2 by FDA under an Emergency Use Authorization (EUA). This EUA will remain in effect (meaning this test can be used) for the duration of the COVID-19 declaration under Section 564(b)(1) of the Act, 21 U.S.C. section 360bbb-3(b)(1), unless the authorization is terminated or revoked.     Resp Syncytial Virus by PCR NEGATIVE NEGATIVE Final    Comment: (NOTE) Fact Sheet for Patients: bloggercourse.com  Fact Sheet for Healthcare Providers: seriousbroker.it  This test is not yet approved or cleared by the [place]  FDA and has been authorized for detection and/or diagnosis of SARS-CoV-2 by FDA under an Emergency Use Authorization (EUA). This EUA will remain in effect (meaning this test can be used) for the duration of the COVID-19 declaration under Section 564(b)(1) of the Act, 21 U.S.C. section 360bbb-3(b)(1), unless the authorization is terminated or revoked.  Performed at [facility], [address], [place], [place] [address]   Respiratory (~20 pathogens) panel by PCR     Status: None   Collection Time: 03/01/24  4:06 PM   Specimen: Nasopharyngeal Swab; Respiratory  Result  Value Ref Range Status   Adenovirus NOT DETECTED NOT DETECTED Final   Coronavirus 229E NOT DETECTED NOT DETECTED Final    Comment: (NOTE) The Coronavirus on the Respiratory Panel, DOES NOT test for the novel  Coronavirus (2019 nCoV)    Coronavirus HKU1 NOT DETECTED NOT DETECTED Final   Coronavirus NL63 NOT DETECTED NOT DETECTED Final   Coronavirus OC43 NOT DETECTED NOT DETECTED Final   Metapneumovirus NOT DETECTED NOT DETECTED Final  Rhinovirus / Enterovirus NOT DETECTED NOT DETECTED Final   Influenza A NOT DETECTED NOT DETECTED Final   Influenza B NOT DETECTED NOT DETECTED Final   Parainfluenza Virus 1 NOT DETECTED NOT DETECTED Final   Parainfluenza Virus 2 NOT DETECTED NOT DETECTED Final   Parainfluenza Virus 3 NOT DETECTED NOT DETECTED Final   Parainfluenza Virus 4 NOT DETECTED NOT DETECTED Final   Respiratory Syncytial Virus NOT DETECTED NOT DETECTED Final   Bordetella pertussis NOT DETECTED NOT DETECTED Final   Bordetella Parapertussis NOT DETECTED NOT DETECTED Final   Chlamydophila pneumoniae NOT DETECTED NOT DETECTED Final   Mycoplasma pneumoniae NOT DETECTED NOT DETECTED Final    Comment: Performed at [facility], [address] [place], [place] [address]  Culture, blood (Routine X 2) w Reflex to ID Panel     Status: None (Preliminary result)   Collection Time: 03/01/24  7:15 PM   Specimen: BLOOD LEFT ARM  Result Value Ref Range Status   Specimen Description   Final    BLOOD LEFT ARM Performed at [facility], [address] [place], [place] [address]    Special Requests   Final    BOTTLES DRAWN AEROBIC ONLY Blood Culture adequate volume Performed at [facility], [address], [place], [place] [address]    Culture   Final    NO GROWTH 3 DAYS Performed at [facility], [address] [place], [place] [address]    Report Status PENDING  Incomplete  Culture, blood (Routine X 2) w Reflex to ID Panel     Status: None  (Preliminary result)   Collection Time: 03/01/24  7:15 PM   Specimen: BLOOD LEFT ARM  Result Value Ref Range Status   Specimen Description   Final    BLOOD LEFT ARM Performed at [facility], [address], [place], [place] [address]    Special Requests   Final    BOTTLES DRAWN AEROBIC ONLY Blood Culture adequate volume Performed at [facility], [address], [place], [place] [address]    Culture   Final    NO GROWTH 3 DAYS Performed at [facility], [address], [place], [place] [address]    Report Status PENDING  Incomplete     Labs: BNP (last 3 results) Recent Labs    07/24/23 1502  BNP 24.7   Basic Metabolic Panel: Recent Labs  Lab 03/01/24 1142 03/02/24 0448 03/02/24 2154  NA 138 136  --   K 4.3 5.1  --   CL 102 101  --   CO2 26 23  --   GLUCOSE 176* 268* 381*  BUN 12 13  --   CREATININE 0.80 0.77  --   CALCIUM 10.2 9.2  --    Liver Function Tests: Recent Labs  Lab 03/02/24 0448  AST 23  ALT 21  ALKPHOS 73  BILITOT 0.5  PROT 6.8  ALBUMIN 3.6   No results for input(s): LIPASE, AMYLASE in the last 168 hours. No results for input(s): AMMONIA in the last 168 hours. CBC: Recent Labs  Lab 03/01/24 1142 03/02/24 0448  WBC 11.1* 7.4  HGB 14.1 14.5  HCT 44.3 45.6  MCV 87.9 88.9  PLT 175 144*   Cardiac Enzymes: No results for input(s): CKTOTAL, CKMB, CKMBINDEX, TROPONINI in the last 168 hours. BNP: Invalid input(s): POCBNP CBG: Recent Labs  Lab 03/02/24 2127 03/03/24 0819 03/03/24 1145 03/03/24 1729 03/03/24 2114  GLUCAP 413* 145* 154* 293* 235*   D-Dimer No results for input(s): DDIMER in the last 72 hours. Hgb A1c Recent Labs    03/01/24 1915  HGBA1C 5.4   Lipid Profile No results for input(s): CHOL, HDL, LDLCALC, TRIG, CHOLHDL, LDLDIRECT in the last 72 hours. Thyroid  function studies No results for input(s): TSH, T4TOTAL, T3FREE, THYROIDAB in the last 72  hours.  Invalid input(s): FREET3 Anemia work up No results for input(s): VITAMINB12, FOLATE, FERRITIN, TIBC, IRON, RETICCTPCT in the last 72 hours. Urinalysis    Component Value Date/Time   COLORURINE BLUE (A) 03/02/2024 1505   APPEARANCEUR CLEAR 03/02/2024 1505   LABSPEC 1.015 03/02/2024 1505   PHURINE 5.0 03/02/2024 1505   GLUCOSEU >=500 (A) 03/02/2024 1505   HGBUR NEGATIVE 03/02/2024 1505   BILIRUBINUR NEGATIVE 03/02/2024 1505   KETONESUR NEGATIVE 03/02/2024 1505   PROTEINUR NEGATIVE 03/02/2024 1505   UROBILINOGEN 0.2 12/31/2012 1341   NITRITE NEGATIVE 03/02/2024 1505   LEUKOCYTESUR NEGATIVE 03/02/2024 1505   Sepsis Labs Recent Labs  Lab 03/01/24 1142 03/02/24 0448  WBC 11.1* 7.4   Microbiology Recent Results (from the past 240 hours)  Expectorated Sputum Assessment w Gram Stain, Rflx to Resp Cult     Status: None   Collection Time: 03/01/24  8:25 AM   Specimen: Sputum  Result Value Ref Range Status   Specimen Description   Final    SPUTUM Performed at [facility], [address], [place], [place] [address]    Special Requests   Final    NONE Performed at [facility], [address] [place], [place] [address]    Sputum evaluation   Final    THIS SPECIMEN IS ACCEPTABLE FOR SPUTUM CULTURE Consistent with normal respiratory flora. Performed at [facility], [address] [place], [place] [address]    Report Status 03/02/2024 FINAL  Final  Culture, Respiratory w Gram Stain     Status: None (Preliminary result)   Collection Time: 03/01/24  8:25 AM   Specimen: SPU  Result Value Ref Range Status   Specimen Description   Final    SPUTUM Performed at [facility], [address], [place], [place] [address]    Special Requests   Final    NONE Reflexed from [phone] Performed at [facility], [address] [place], [place] [address]    Gram Stain   Final    RARE  WBC SEEN RARE GRAM POSITIVE COCCI Performed at [facility], [address], [place], [place] [address]    Culture PENDING  Incomplete   Report Status PENDING  Incomplete  Resp panel by RT-PCR (RSV, Flu A&B, Covid) Anterior Nasal Swab     Status: None   Collection Time: 03/01/24 11:16 AM   Specimen: Anterior Nasal Swab  Result Value Ref Range Status   SARS Coronavirus 2 by RT PCR NEGATIVE NEGATIVE Final    Comment: (NOTE) SARS-CoV-2 target nucleic acids are NOT DETECTED.  The SARS-CoV-2 RNA is generally detectable in upper respiratory specimens during the acute phase of infection. The lowest concentration of SARS-CoV-2 viral copies this assay can detect is 138 copies/mL. A negative result does not preclude SARS-Cov-2 infection and should not be used as the sole basis for treatment or other patient management decisions. A negative result may occur with  improper specimen collection/handling, submission of specimen other than nasopharyngeal swab, presence of viral mutation(s) within the areas targeted by this assay, and inadequate  number of viral copies(<138 copies/mL). A negative result must be combined with clinical observations, patient history, and epidemiological information. The expected result is Negative.  Fact Sheet for Patients:  bloggercourse.com  Fact Sheet for Healthcare Providers:  seriousbroker.it  This test is no t yet approved or cleared by the [place]  FDA and  has been authorized for detection and/or diagnosis of SARS-CoV-2 by FDA under an Emergency Use Authorization (EUA). This EUA will remain  in effect (meaning this test can be used) for the duration of the COVID-19 declaration under Section 564(b)(1) of the Act, 21 U.S.C.section 360bbb-3(b)(1), unless the authorization is terminated  or revoked sooner.       Influenza A by PCR NEGATIVE NEGATIVE Final   Influenza B by PCR NEGATIVE NEGATIVE  Final    Comment: (NOTE) The Xpert Xpress SARS-CoV-2/FLU/RSV plus assay is intended as an aid in the diagnosis of influenza from Nasopharyngeal swab specimens and should not be used as a sole basis for treatment. Nasal washings and aspirates are unacceptable for Xpert Xpress SARS-CoV-2/FLU/RSV testing.  Fact Sheet for Patients: bloggercourse.com  Fact Sheet for Healthcare Providers: seriousbroker.it  This test is not yet approved or cleared by the [place]  FDA and has been authorized for detection and/or diagnosis of SARS-CoV-2 by FDA under an Emergency Use Authorization (EUA). This EUA will remain in effect (meaning this test can be used) for the duration of the COVID-19 declaration under Section 564(b)(1) of the Act, 21 U.S.C. section 360bbb-3(b)(1), unless the authorization is terminated or revoked.     Resp Syncytial Virus by PCR NEGATIVE NEGATIVE Final    Comment: (NOTE) Fact Sheet for Patients: bloggercourse.com  Fact Sheet for Healthcare Providers: seriousbroker.it  This test is not yet approved or cleared by the [place]  FDA and has been authorized for detection and/or diagnosis of SARS-CoV-2 by FDA under an Emergency Use Authorization (EUA). This EUA will remain in effect (meaning this test can be used) for the duration of the COVID-19 declaration under Section 564(b)(1) of the Act, 21 U.S.C. section 360bbb-3(b)(1), unless the authorization is terminated or revoked.  Performed at [facility], [address] [place], [place] [address]   Respiratory (~20 pathogens) panel by PCR     Status: None   Collection Time: 03/01/24  4:06 PM   Specimen: Nasopharyngeal Swab; Respiratory  Result Value Ref Range Status   Adenovirus NOT DETECTED NOT DETECTED Final   Coronavirus 229E NOT DETECTED NOT DETECTED Final    Comment: (NOTE) The Coronavirus  on the Respiratory Panel, DOES NOT test for the novel  Coronavirus (2019 nCoV)    Coronavirus HKU1 NOT DETECTED NOT DETECTED Final   Coronavirus NL63 NOT DETECTED NOT DETECTED Final   Coronavirus OC43 NOT DETECTED NOT DETECTED Final   Metapneumovirus NOT DETECTED NOT DETECTED Final   Rhinovirus / Enterovirus NOT DETECTED NOT DETECTED Final   Influenza A NOT DETECTED NOT DETECTED Final   Influenza B NOT DETECTED NOT DETECTED Final   Parainfluenza Virus 1 NOT DETECTED NOT DETECTED Final   Parainfluenza Virus 2 NOT DETECTED NOT DETECTED Final   Parainfluenza Virus 3 NOT DETECTED NOT DETECTED Final   Parainfluenza Virus 4 NOT DETECTED NOT DETECTED Final   Respiratory Syncytial Virus NOT DETECTED NOT DETECTED Final   Bordetella pertussis NOT DETECTED NOT DETECTED Final   Bordetella Parapertussis NOT DETECTED NOT DETECTED Final   Chlamydophila pneumoniae NOT DETECTED NOT DETECTED Final   Mycoplasma pneumoniae NOT DETECTED NOT DETECTED Final    Comment: Performed  at [facility], [address], [place], [place] [address]  Culture, blood (Routine X 2) w Reflex to ID Panel     Status: None (Preliminary result)   Collection Time: 03/01/24  7:15 PM   Specimen: BLOOD LEFT ARM  Result Value Ref Range Status   Specimen Description   Final    BLOOD LEFT ARM Performed at [facility], [address], [place], [place] [address]    Special Requests   Final    BOTTLES DRAWN AEROBIC ONLY Blood Culture adequate volume Performed at [facility], [address] [place], [place] [address]    Culture   Final    NO GROWTH 3 DAYS Performed at [facility], [address], [place], [place] [address]    Report Status PENDING  Incomplete  Culture, blood (Routine X 2) w Reflex to ID Panel     Status: None (Preliminary result)   Collection Time: 03/01/24  7:15 PM   Specimen: BLOOD LEFT ARM  Result Value Ref Range Status   Specimen Description   Final    BLOOD LEFT  ARM Performed at [facility], [address], [place], [place] [address]    Special Requests   Final    BOTTLES DRAWN AEROBIC ONLY Blood Culture adequate volume Performed at [facility], [address] [place], [place] [address]    Culture   Final    NO GROWTH 3 DAYS Performed at [facility], [address] [place], [place] [address]    Report Status PENDING  Incomplete     Time coordinating discharge: Over 35 minutes  SIGNED:   Erle Odell Castor, MD  [facility] 03/04/2024, 7:15 AM Pager   If 7PM-7AM, please contact night-coverage [email or web address] Password [email or web address]

## 2024-03-04 NOTE — Plan of Care (Signed)
  Problem: Education: Goal: Knowledge of General Education information will improve Description: Including pain rating scale, medication(s)/side effects and non-pharmacologic comfort measures Outcome: Progressing   Problem: Health Behavior/Discharge Planning: Goal: Ability to manage health-related needs will improve Outcome: Progressing   Problem: Clinical Measurements: Goal: Ability to maintain clinical measurements within normal limits will improve Outcome: Progressing Goal: Will remain free from infection Outcome: Progressing Goal: Diagnostic test results will improve Outcome: Progressing Goal: Respiratory complications will improve Outcome: Progressing Goal: Cardiovascular complication will be avoided Outcome: Progressing   Problem: Activity: Goal: Risk for activity intolerance will decrease Outcome: Progressing   Problem: Nutrition: Goal: Adequate nutrition will be maintained Outcome: Progressing   Problem: Coping: Goal: Level of anxiety will decrease Outcome: Progressing   Problem: Elimination: Goal: Will not experience complications related to bowel motility Outcome: Progressing Goal: Will not experience complications related to urinary retention Outcome: Progressing   Problem: Pain Managment: Goal: General experience of comfort will improve and/or be controlled Outcome: Progressing   Problem: Safety: Goal: Ability to remain free from injury will improve Outcome: Progressing   Problem: Skin Integrity: Goal: Risk for impaired skin integrity will decrease Outcome: Progressing   Problem: Education: Goal: Ability to describe self-care measures that may prevent or decrease complications (Diabetes Survival Skills Education) will improve Outcome: Progressing Goal: Individualized Educational Video(s) Outcome: Progressing   Problem: Coping: Goal: Ability to adjust to condition or change in health will improve Outcome: Progressing   Problem: Fluid  Volume: Goal: Ability to maintain a balanced intake and output will improve Outcome: Progressing   Problem: Health Behavior/Discharge Planning: Goal: Ability to identify and utilize available resources and services will improve Outcome: Progressing Goal: Ability to manage health-related needs will improve Outcome: Progressing   Problem: Metabolic: Goal: Ability to maintain appropriate glucose levels will improve Outcome: Progressing   Problem: Nutritional: Goal: Maintenance of adequate nutrition will improve Outcome: Progressing Goal: Progress toward achieving an optimal weight will improve Outcome: Progressing   Problem: Skin Integrity: Goal: Risk for impaired skin integrity will decrease Outcome: Progressing   Problem: Tissue Perfusion: Goal: Adequacy of tissue perfusion will improve Outcome: Progressing   Problem: Education: Goal: Knowledge of disease or condition will improve Outcome: Progressing Goal: Knowledge of the prescribed therapeutic regimen will improve Outcome: Progressing Goal: Individualized Educational Video(s) Outcome: Progressing   Problem: Activity: Goal: Ability to tolerate increased activity will improve Outcome: Progressing Goal: Will verbalize the importance of balancing activity with adequate rest periods Outcome: Progressing   Problem: Respiratory: Goal: Ability to maintain a clear airway will improve Outcome: Progressing Goal: Levels of oxygenation will improve Outcome: Progressing Goal: Ability to maintain adequate ventilation will improve Outcome: Progressing   Problem: Activity: Goal: Ability to tolerate increased activity will improve Outcome: Progressing   Problem: Clinical Measurements: Goal: Ability to maintain a body temperature in the normal range will improve Outcome: Progressing   Problem: Respiratory: Goal: Ability to maintain adequate ventilation will improve Outcome: Progressing Goal: Ability to maintain a clear  airway will improve Outcome: Progressing

## 2024-03-05 ENCOUNTER — Telehealth: Payer: Self-pay

## 2024-03-05 NOTE — Transitions of Care (Post Inpatient/ED Visit) (Signed)
   03/05/2024  Name: JESELLE HISER MRN: [id number] DOB: October 12, 1950  Today's TOC FU Call Status: Today's TOC FU Call Status:: Unsuccessful Call (1st Attempt) Unsuccessful Call (1st Attempt) Date: 03/05/24  Attempted to reach the patient regarding the most recent Inpatient/ED visit.  Follow Up Plan: Additional outreach attempts will be made to reach the patient to complete the Transitions of Care (Post Inpatient/ED visit) call.   Marilou Barnfield J. Agustus Mane RN, MSN [facility], [facility] RN Care Manager Direct Dial: [phone]  Fax: [phone] Website: [email or web address]

## 2024-03-06 ENCOUNTER — Telehealth: Payer: Self-pay

## 2024-03-06 LAB — CULTURE, BLOOD (ROUTINE X 2)
Culture: NO GROWTH
Culture: NO GROWTH
Special Requests: ADEQUATE
Special Requests: ADEQUATE

## 2024-03-06 NOTE — Patient Instructions (Signed)
 Visit Information  Thank you for taking time to visit with me today.   You have a new or higher fever. You are coughing more deeply or more often. You are having increased shortness of breath. You are not getting better after 2 days (48 hours). You do not get better as expected.  Patient verbalizes understanding of instructions and care plan provided today and agrees to view in MyChart. Active MyChart status and patient understanding of how to access instructions and care plan via MyChart confirmed with patient.     The patient has been provided with contact information for the care management team and has been advised to call with any health related questions or concerns.   Please call the care guide team at [phone] if you need to cancel or reschedule your appointment.   Please call the Suicide and Crisis Lifeline: 988 call the [place]  National Suicide Prevention Lifeline: 239-573-6350 or TTY: 2310902330 TTY 540-247-1829) to talk to a trained counselor if you are experiencing a Mental Health or Behavioral Health Crisis or need someone to talk to.  Damarious Holtsclaw J. Perley Arthurs RN, MSN [facility], [facility] RN Care Manager Direct Dial: [phone]  Fax: [phone] Website: [email or web address]

## 2024-03-06 NOTE — Transitions of Care (Post Inpatient/ED Visit) (Signed)
 03/06/2024  Name: [name] MRN: [id number] DOB: 04-Feb-1951  Today's TOC FU Call Status: Today's TOC FU Call Status:: Successful TOC FU Call Completed TOC FU Call Complete Date: 03/06/24  Patient's Name and Date of Birth confirmed. Name, DOB  Transition Care Management Follow-up Telephone Call Date of Discharge: 03/04/24 Discharge Facility: [facility]) Type of Discharge: Inpatient Admission Primary Inpatient Discharge Diagnosis:: Acute respiratory failure with hypoxia How have you been since you were released from the hospital?: Better Any questions or concerns?: No  Items Reviewed: Did you receive and understand the discharge instructions provided?: Yes Medications obtained,verified, and reconciled?: Yes (Medications Reviewed) Any new allergies since your discharge?: No Dietary orders reviewed?: Yes Type of Diet Ordered:: Heart Healthy carb modified Do you have support at home?: Yes People in Home [RPT]: alone, friend(s) Name of Support/Comfort Primary Source: Amy  Medications Reviewed Today: Medications Reviewed Today     Reviewed by Sharese Manrique, RN (Case Manager) on 03/06/24 at 1130  Med List Status: <None>   Medication Order Taking? Sig Documenting Provider Last Dose Status Informant  albuterol  (PROVENTIL ) (2.5 MG/3ML) 0.083% nebulizer solution [id number] Yes Take 3 mLs (2.5 mg total) by nebulization every 6 (six) hours as needed for wheezing or shortness of breath (dx: J43.9). Sood, Vineet, MD  Active Self  ALPRAZolam  (XANAX ) 0.5 MG tablet [id number] Yes Take 0.25-0.5 mg by mouth 3 (three) times daily as needed for sleep or anxiety. [provider]  Active Self           Med Note FELIPE, MELISSA B   Fri Apr 15, 2016  1:27 PM)    amoxicillin -clavulanate (AUGMENTIN ) 875-125 MG tablet [id number] Yes Take 1 tablet by mouth every 12 (twelve) hours. Odell Celinda Balo, MD  Active   azithromycin  (ZITHROMAX ) 500 MG tablet [id number] Yes Take 1 tablet (500 mg  total) by mouth daily for 3 days. Odell Celinda Balo, MD  Active   budesonide -glycopyrrolate -formoterol  (BREZTRI  AEROSPHERE) 160-9-4.8 MCG/ACT AERO inhaler [id number] Yes USE 2 INHALATIONS IN THE MORNING AND AT BEDTIME  Patient taking differently: Inhale 2 puffs into the lungs in the morning and at bedtime.   Darlean Ozell NOVAK, MD  Active Self  empagliflozin (JARDIANCE) 25 MG TABS tablet [id number] Yes Take 25 mg by mouth daily. [provider]  Active Self  folic acid  (FOLVITE ) 1 MG tablet [id number] Yes Take 1 mg by mouth daily. [provider]  Active Self  furosemide (LASIX) 40 MG tablet [id number] Yes Take 20-40 mg by mouth daily as needed for edema or fluid. [provider]  Active Self  gabapentin  (NEURONTIN ) 600 MG tablet [id number] Yes Take 600 mg by mouth at bedtime. [provider]  Active Self  HUMALOG KWIKPEN 100 UNIT/ML KwikPen [id number] Yes Inject 6-15 Units into the skin See admin instructions. Inject 6 units subcutaneously in the morning & 15 units at supper [provider]  Active Self  hydrocortisone 2.5%-nystatin-zinc  oxide 20% 1:1:1 ointment mixture [id number] Yes Apply topically 3 (three) times daily as needed.  Patient taking differently: Apply 1 application  topically 3 (three) times daily as needed for rash (buttocks).   Jayne Vonn DEL, MD  Active Self  Ibuprofen 200 MG CAPS [id number] Yes Take 200 mg by mouth every 6 (six) hours as needed (for pain or inflammation). [provider]  Active Self  LANTUS  SOLOSTAR 100 UNIT/ML Solostar Pen [id number] Yes Inject 10 Units into the skin daily. Inject 10 units into  the skin daily  Patient taking  differently: Inject 50 Units into the skin See admin instructions. Inject 50 units into the skin before breakfast and at bedtime   Rosario Eland I, MD  Active Self  levocetirizine (XYZAL) 5 MG tablet [id number] Yes Take 5 mg by mouth every evening. [provider]  Active Self   metFORMIN  (GLUCOPHAGE -XR) 500 MG 24 hr tablet [id number] Yes Take 2,000 mg by mouth daily with breakfast. [provider]  Active Self           Med Note MARISA, NATHANEL SAILOR   Fri Mar 01, 2024  8:07 PM)    montelukast  (SINGULAIR ) 10 MG tablet [id number] Yes TAKE 1 TABLET AT BEDTIME Wert, Michael B, MD  Active Self  olmesartan  (BENICAR ) 20 MG tablet [id number] Yes Take 20 mg by mouth in the morning. [provider]  Active Self  omeprazole  (PRILOSEC) 40 MG capsule [id number] Yes TAKE 1 CAPSULE EVERY MORNING  Patient taking differently: Take 40 mg by mouth daily before breakfast.   Sood, Vineet, MD  Active Self  pentosan polysulfate (ELMIRON ) 100 MG capsule [id number] Yes Take 2 tablets twice daily  Patient taking differently: Take 200 mg by mouth in the morning and at bedtime.   Jayne Vonn DEL, MD  Active Self  predniSONE  (DELTASONE ) 10 MG tablet [id number] Yes Takes  2 tabs for 1 days, then 1 tab for 1 days, and then stop. Odell Celinda Balo, MD  Active   sertraline  (ZOLOFT ) 100 MG tablet [id number] Yes Take 100 mg by mouth in the morning. [provider]  Active Self  simvastatin  (ZOCOR ) 40 MG tablet [id number] Yes Take 40 mg by mouth daily with supper. [provider]  Active Self  SYSTANE PRO PF 0.6 % SOLN [id number] Yes Place 1 drop into both eyes in the morning and at bedtime. [provider]  Active Self  TRULICITY 3 MG/0.5ML EMMANUEL [id number] Yes Inject 3 mg into the skin every Saturday. [provider]  Active Self  TYLENOL  8 HOUR ARTHRITIS PAIN 650 MG CR tablet [id number] Yes Take 650-1,300 mg by mouth every 8 (eight) hours as needed for pain. [provider]  Active Self  Urelle  (URELLE /URISED) 81 MG TABS tablet [id number] Yes Take 1 tablet (81 mg total) by mouth 4 (four) times daily. Jayne Vonn DEL, MD  Active Self            Home Care and Equipment/Supplies: Were Home Health Services Ordered?: NA Any new equipment or medical  supplies ordered?: NA  Functional Questionnaire: Do you need assistance with bathing/showering or dressing?: Yes Do you need assistance with meal preparation?: Yes Do you need assistance with eating?: Yes Do you have difficulty maintaining continence: Yes Do you need assistance with getting out of bed/getting out of a chair/moving?: Yes Do you have difficulty managing or taking your medications?: Yes  Follow up appointments reviewed: PCP Follow-up appointment confirmed?: Yes Date of PCP follow-up appointment?: 03/13/24 Specialist Hospital Follow-up appointment confirmed?: NA Do you need transportation to your follow-up appointment?: No Do you understand care options if your condition(s) worsen?: Yes-patient verbalized understanding (Advised on:  You have a new or higher fever.   You are coughing more deeply or more often.   You are having increased shortness of breath.   You are not getting better after 2 days (48 hours).   You do not get better as expected.)  SDOH Interventions Today    Flowsheet Row Most Recent Value  SDOH Interventions   Food Insecurity Interventions Intervention Not  Indicated  Housing Interventions Intervention Not Indicated  Transportation Interventions Intervention Not Indicated  Utilities Interventions Intervention Not Indicated    Oliver Neuwirth J. Shira Bobst RN, MSN [facility]  [facility], [facility] RN Care Manager Direct Dial: [phone]  Fax: [phone] Website: [email or web address]

## 2024-03-12 DIAGNOSIS — H353134 Nonexudative age-related macular degeneration, bilateral, advanced atrophic with subfoveal involvement: Secondary | ICD-10-CM | POA: Diagnosis not present

## 2024-03-13 DIAGNOSIS — J189 Pneumonia, unspecified organism: Secondary | ICD-10-CM | POA: Diagnosis not present

## 2024-03-13 DIAGNOSIS — A419 Sepsis, unspecified organism: Secondary | ICD-10-CM | POA: Diagnosis not present

## 2024-03-13 DIAGNOSIS — M069 Rheumatoid arthritis, unspecified: Secondary | ICD-10-CM | POA: Diagnosis not present

## 2024-03-13 DIAGNOSIS — J9601 Acute respiratory failure with hypoxia: Secondary | ICD-10-CM | POA: Diagnosis not present

## 2024-03-14 DIAGNOSIS — Z8631 Personal history of diabetic foot ulcer: Secondary | ICD-10-CM | POA: Diagnosis not present

## 2024-03-14 DIAGNOSIS — E1142 Type 2 diabetes mellitus with diabetic polyneuropathy: Secondary | ICD-10-CM | POA: Diagnosis not present

## 2024-03-18 DIAGNOSIS — M0589 Other rheumatoid arthritis with rheumatoid factor of multiple sites: Secondary | ICD-10-CM | POA: Diagnosis not present

## 2024-03-19 ENCOUNTER — Telehealth: Payer: Self-pay

## 2024-03-19 NOTE — Telephone Encounter (Signed)
 CMN received from [facility] regarding pt's CPAP supplies. This is only requiring a signature from the provider. I will fax once this has been signed.

## 2024-03-22 NOTE — Telephone Encounter (Signed)
 Am I okay to send in script for cpap supplies,patient was seen in September 2025

## 2024-03-26 ENCOUNTER — Telehealth: Payer: Self-pay

## 2024-03-26 DIAGNOSIS — G4733 Obstructive sleep apnea (adult) (pediatric): Secondary | ICD-10-CM

## 2024-03-26 NOTE — Telephone Encounter (Signed)
 Copied from CRM #[id number]. Topic: Clinical - Order For Equipment >> Mar 26, 2024  9:42 AM Devaughn RAMAN wrote: Reason for CRM: Pt calling in regards to order to [facility] for CPAP supplies. Pt stated [facility] will not send her new supplies unless an order is placed. Pt would like a f/u call back regarding this.  CPAP supplies sent. Pt informed. NFN

## 2024-03-27 NOTE — Telephone Encounter (Signed)
 Yes, sorry for delay  Ok to send script for CPAP supplies

## 2024-04-01 NOTE — Telephone Encounter (Signed)
 This has been signed and faxed. NFN

## 2024-04-22 ENCOUNTER — Ambulatory Visit: Admitting: Obstetrics & Gynecology

## 2024-04-22 VITALS — BP 102/64 | Ht 62.0 in | Wt 248.0 lb

## 2024-04-22 DIAGNOSIS — N3010 Interstitial cystitis (chronic) without hematuria: Secondary | ICD-10-CM | POA: Diagnosis not present

## 2024-04-22 NOTE — Progress Notes (Signed)
 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    The external urethra meatus was prepped with betadine DMSO 50 cc was instilled in the usual fashion after the bladder was catheterized and emptied completely 50cc was instilled into the bladder without difficulty and the patient tolerated well She will refrain from voiding as long as possible  Follow up in 8 weeks, or as patient requests based on her symptom complex

## 2024-05-08 ENCOUNTER — Other Ambulatory Visit: Payer: Self-pay | Admitting: Internal Medicine

## 2024-05-08 DIAGNOSIS — Z1231 Encounter for screening mammogram for malignant neoplasm of breast: Secondary | ICD-10-CM

## 2024-05-13 ENCOUNTER — Other Ambulatory Visit: Payer: Self-pay | Admitting: Obstetrics & Gynecology

## 2024-06-03 ENCOUNTER — Ambulatory Visit

## 2024-06-10 ENCOUNTER — Ambulatory Visit

## 2024-06-17 ENCOUNTER — Ambulatory Visit: Admitting: Obstetrics & Gynecology

## 2024-06-25 ENCOUNTER — Encounter: Admitting: Pulmonary Disease

## 2024-12-02 ENCOUNTER — Other Ambulatory Visit
